# Patient Record
Sex: Male | Born: 1960 | Race: Black or African American | Hispanic: No | Marital: Single | Smoking: Former smoker
Health system: Southern US, Community
[De-identification: ages and names within clinical notes are randomized; demographics above are authoritative.]

## PROBLEM LIST (undated history)

## (undated) DIAGNOSIS — E119 Type 2 diabetes mellitus without complications: Secondary | ICD-10-CM

## (undated) DIAGNOSIS — J189 Pneumonia, unspecified organism: Secondary | ICD-10-CM

## (undated) DIAGNOSIS — I10 Essential (primary) hypertension: Secondary | ICD-10-CM

## (undated) DIAGNOSIS — K219 Gastro-esophageal reflux disease without esophagitis: Secondary | ICD-10-CM

## (undated) DIAGNOSIS — I639 Cerebral infarction, unspecified: Secondary | ICD-10-CM

## (undated) HISTORY — DX: Type 2 diabetes mellitus without complications: E11.9

## (undated) HISTORY — PX: OTHER SURGICAL HISTORY: SHX169

---

## 2004-11-21 ENCOUNTER — Emergency Department (HOSPITAL_COMMUNITY): Admission: EM | Admit: 2004-11-21 | Discharge: 2004-11-21 | Payer: Self-pay | Admitting: Emergency Medicine

## 2005-08-04 ENCOUNTER — Emergency Department (HOSPITAL_COMMUNITY): Admission: EM | Admit: 2005-08-04 | Discharge: 2005-08-04 | Payer: Self-pay | Admitting: Emergency Medicine

## 2005-08-05 ENCOUNTER — Ambulatory Visit: Payer: Self-pay | Admitting: Internal Medicine

## 2005-08-05 ENCOUNTER — Ambulatory Visit (HOSPITAL_COMMUNITY): Admission: RE | Admit: 2005-08-05 | Discharge: 2005-08-05 | Payer: Self-pay | Admitting: Internal Medicine

## 2006-01-02 ENCOUNTER — Emergency Department (HOSPITAL_COMMUNITY): Admission: EM | Admit: 2006-01-02 | Discharge: 2006-01-03 | Payer: Self-pay | Admitting: Emergency Medicine

## 2006-02-23 ENCOUNTER — Emergency Department (HOSPITAL_COMMUNITY): Admission: EM | Admit: 2006-02-23 | Discharge: 2006-02-23 | Payer: Self-pay | Admitting: Emergency Medicine

## 2006-08-16 ENCOUNTER — Emergency Department (HOSPITAL_COMMUNITY): Admission: EM | Admit: 2006-08-16 | Discharge: 2006-08-16 | Payer: Self-pay | Admitting: Emergency Medicine

## 2007-01-22 ENCOUNTER — Emergency Department (HOSPITAL_COMMUNITY): Admission: EM | Admit: 2007-01-22 | Discharge: 2007-01-22 | Payer: Self-pay | Admitting: Emergency Medicine

## 2007-03-26 ENCOUNTER — Ambulatory Visit (HOSPITAL_COMMUNITY): Admission: RE | Admit: 2007-03-26 | Discharge: 2007-03-26 | Payer: Self-pay | Admitting: Family Medicine

## 2007-06-23 ENCOUNTER — Emergency Department (HOSPITAL_COMMUNITY): Admission: EM | Admit: 2007-06-23 | Discharge: 2007-06-23 | Payer: Self-pay | Admitting: Emergency Medicine

## 2007-07-02 ENCOUNTER — Ambulatory Visit (HOSPITAL_COMMUNITY): Admission: RE | Admit: 2007-07-02 | Discharge: 2007-07-02 | Payer: Self-pay | Admitting: Family Medicine

## 2007-08-22 ENCOUNTER — Emergency Department (HOSPITAL_COMMUNITY): Admission: EM | Admit: 2007-08-22 | Discharge: 2007-08-22 | Payer: Self-pay | Admitting: Emergency Medicine

## 2008-09-02 ENCOUNTER — Emergency Department (HOSPITAL_COMMUNITY): Admission: EM | Admit: 2008-09-02 | Discharge: 2008-09-02 | Payer: Self-pay | Admitting: Emergency Medicine

## 2009-01-09 ENCOUNTER — Emergency Department (HOSPITAL_COMMUNITY): Admission: EM | Admit: 2009-01-09 | Discharge: 2009-01-09 | Payer: Self-pay | Admitting: Emergency Medicine

## 2009-01-26 ENCOUNTER — Ambulatory Visit (HOSPITAL_COMMUNITY): Admission: RE | Admit: 2009-01-26 | Discharge: 2009-01-26 | Payer: Self-pay | Admitting: Family Medicine

## 2009-05-19 ENCOUNTER — Emergency Department (HOSPITAL_COMMUNITY): Admission: EM | Admit: 2009-05-19 | Discharge: 2009-05-19 | Payer: Self-pay | Admitting: Emergency Medicine

## 2009-08-09 IMAGING — CT CT HEAD W/O CM
1 series · 16 of 30 positions shown, 20 images · non-contrast
Comparison: No priors

CLINICAL DATA: Left-sided headaches

CT HEAD WITHOUT CONTRAST
TECHNIQUE: Contiguous axial images were obtained from the base of
the skull through the vertex without contrast.

[Series 2: headseq 4.8 h37s · axial · 0.48mm/px · z∈[+132,+295]mm · 16 of 36 slices shown, 20 images]
[im 2/36  brain]
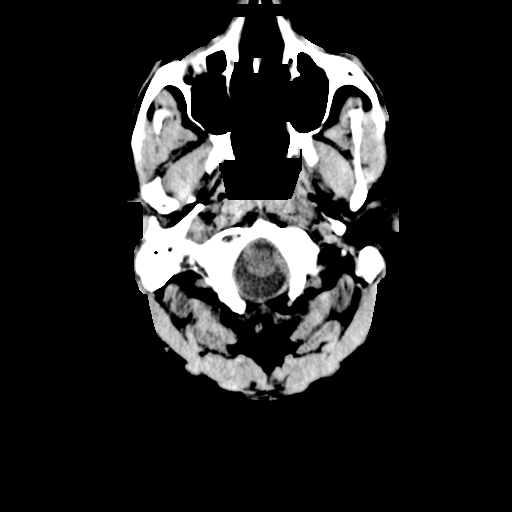
[im 2/36  bone]
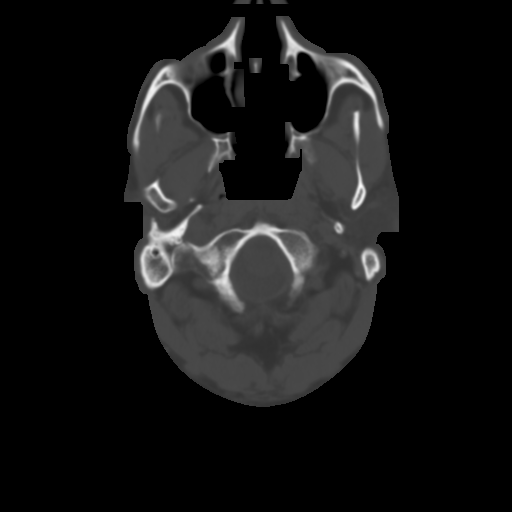
[im 4/36  brain]
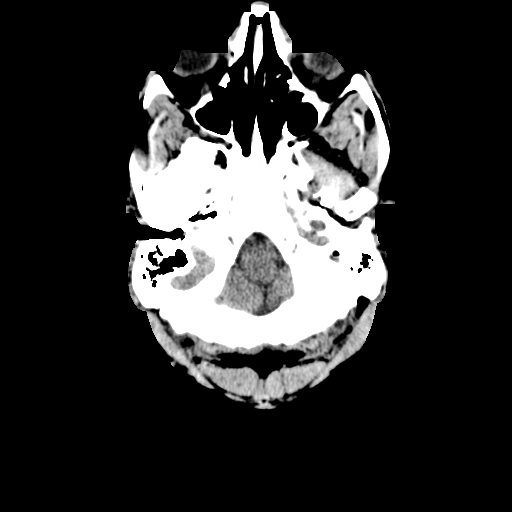
[im 7/36  brain]
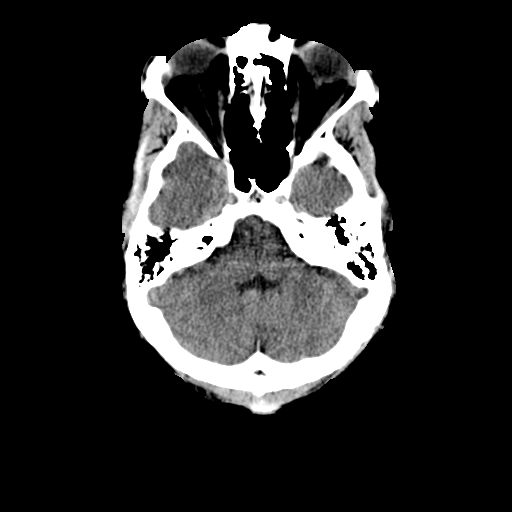
[im 9/36  brain]
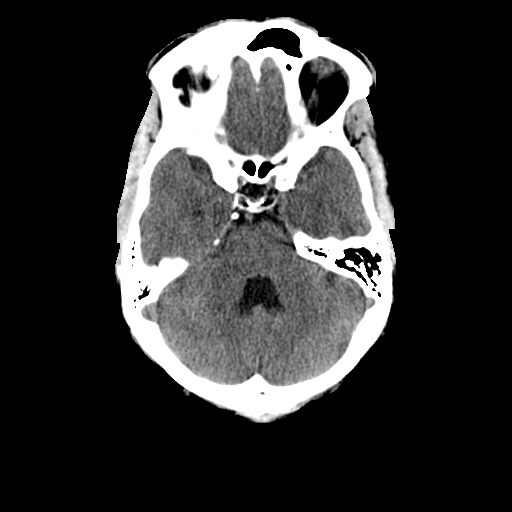
[im 10/36  brain]
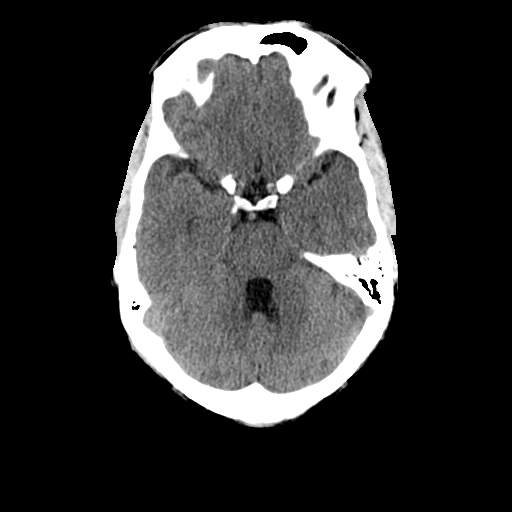
[im 10/36  bone]
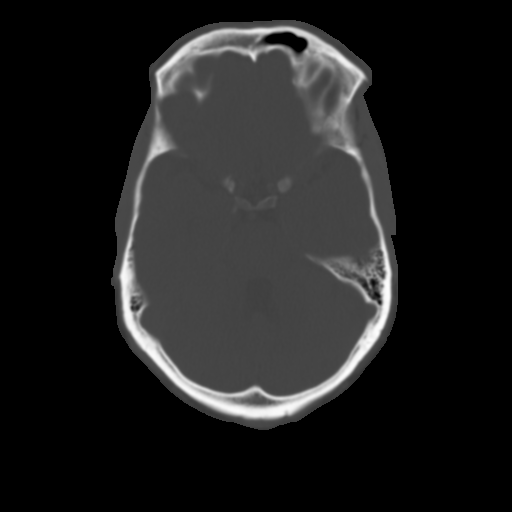
[im 13/36  brain]
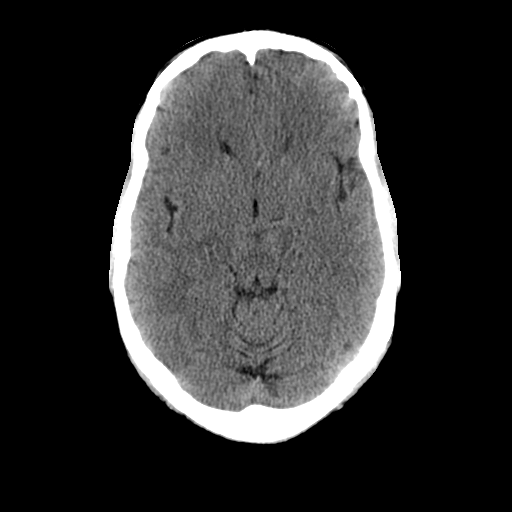
[im 15/36  brain]
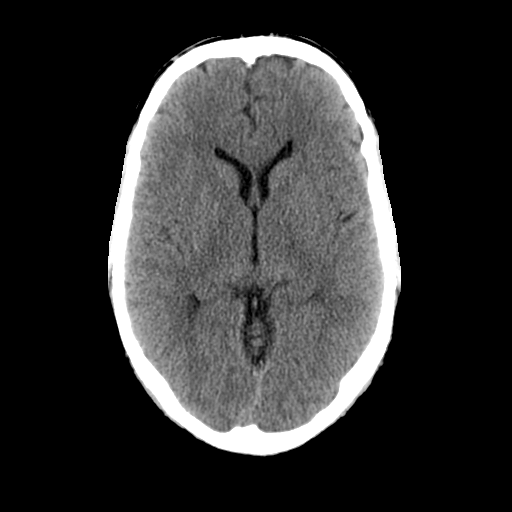
[im 17/36  brain]
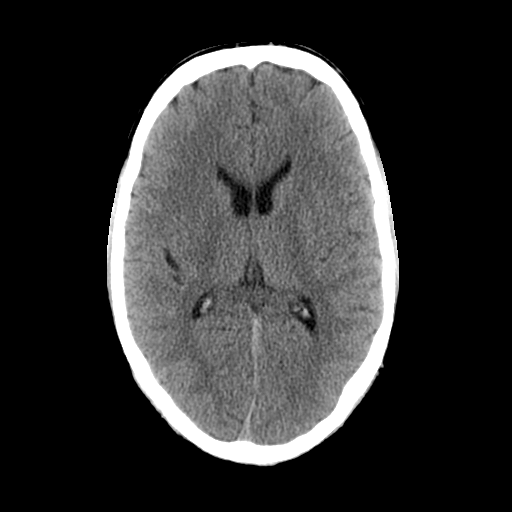
[im 19/36  brain]
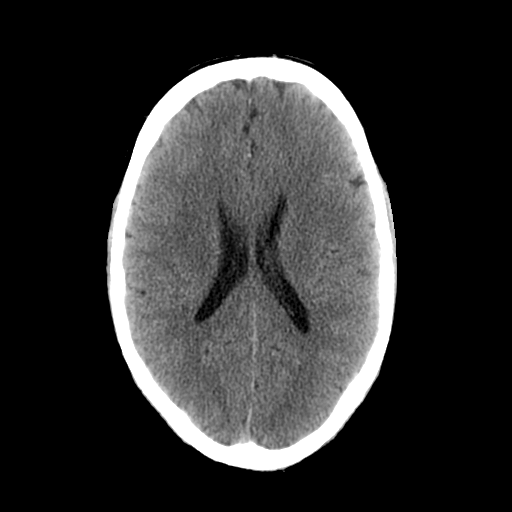
[im 19/36  bone]
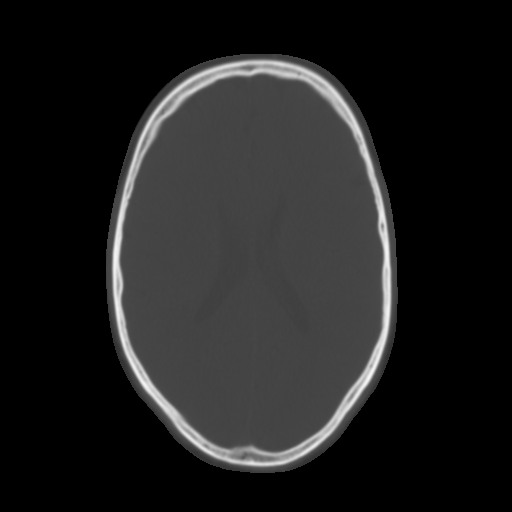
[im 21/36  brain]
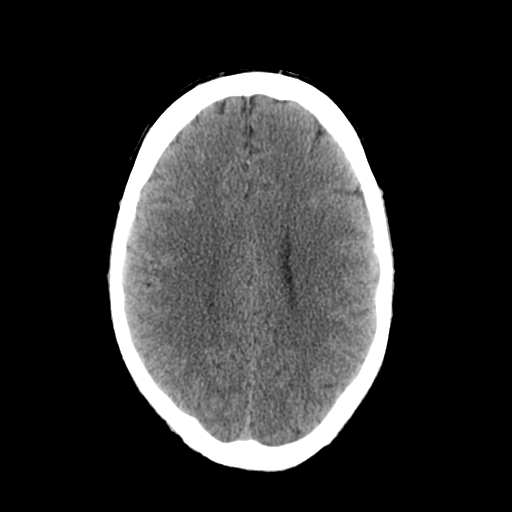
[im 23/36  brain]
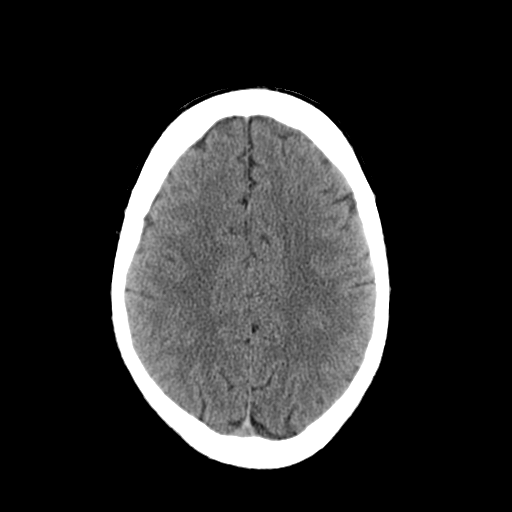
[im 26/36  brain]
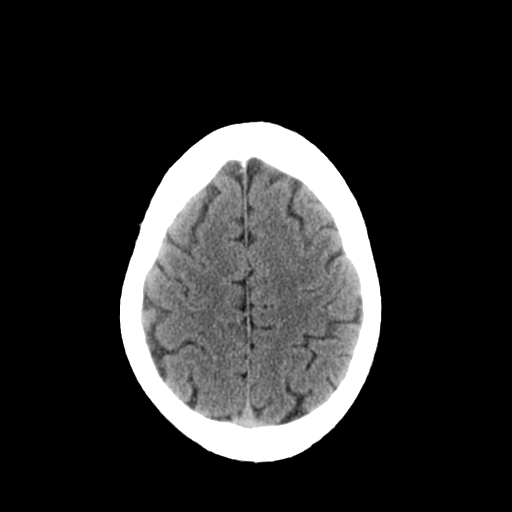
[im 27/36  brain]
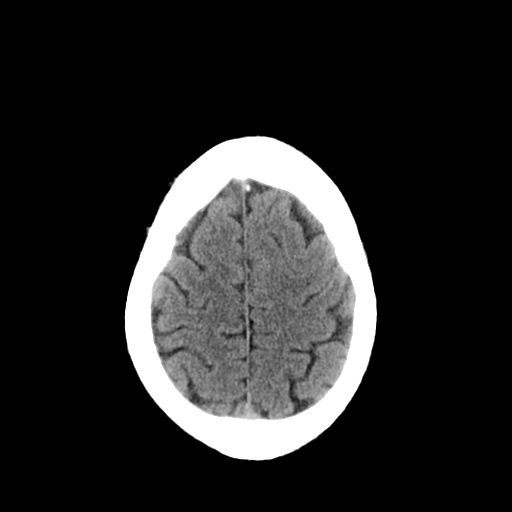
[im 27/36  bone]
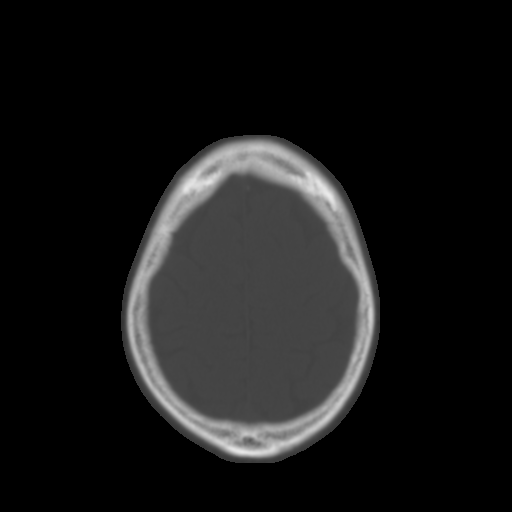
[im 29/36  brain]
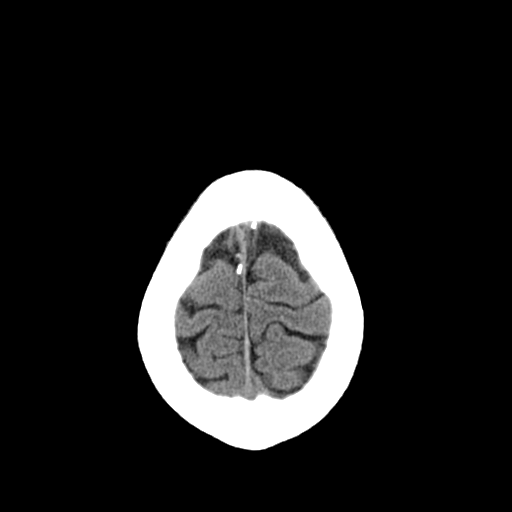
[im 32/36  brain]
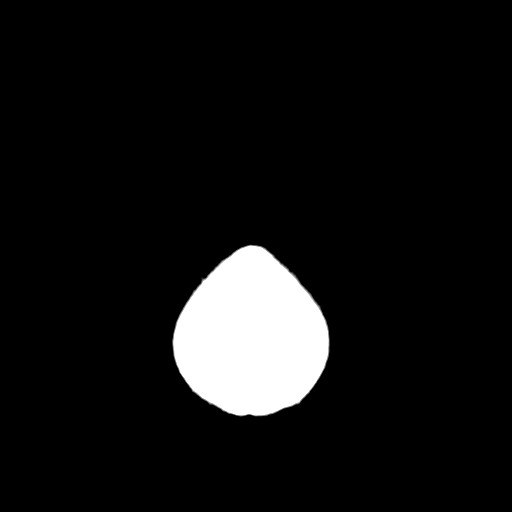
[im 34/36  brain]
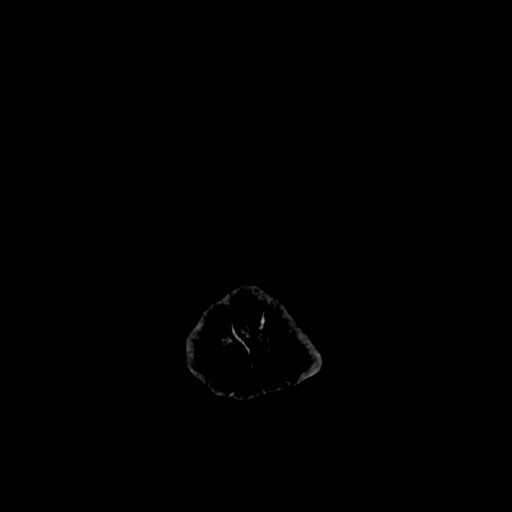

[16 of 30 positions shown; findings below may reference images not displayed]

FINDINGS: Ventricular size and CSF spaces within normal limits. No
evidence for acute infarct or bleed.  No mass effect. Calvarium
intact.  No fluid in the sinuses visualized.
IMPRESSION: No acute or significant findings.

## 2011-04-05 LAB — BASIC METABOLIC PANEL
Calcium: 9.3 mg/dL (ref 8.4–10.5)
GFR calc Af Amer: >60 mL/min (ref >60)
GFR calc non Af Amer: >60 mL/min (ref >60)
Sodium: 140 mEq/L (ref 135–145)

## 2011-04-11 LAB — CBC
HCT: 36.4 % — ABNORMAL LOW (ref 39.0–52.0)
Hemoglobin: 12.2 g/dL — ABNORMAL LOW (ref 13.0–17.0)
Platelets: 259 10*3/uL (ref 150–400)
RDW: 13.3 % (ref 11.5–15.5)

## 2011-04-11 LAB — DIFFERENTIAL
Basophils Absolute: 0.1 10*3/uL (ref 0.0–0.1)
Lymphocytes Relative: 36 % (ref 12–46)
Lymphs Abs: 2.5 10*3/uL (ref 0.7–4.0)
Neutro Abs: 3.9 10*3/uL (ref 1.7–7.7)
Neutrophils Relative %: 56 % (ref 43–77)
Promyelocytes Absolute: 0 %
nRBC: 0 /100 WBC

## 2011-04-11 LAB — URINALYSIS, ROUTINE W REFLEX MICROSCOPIC
Nitrite: NEGATIVE
Specific Gravity, Urine: <1.005 — ABNORMAL LOW (ref 1.005–1.030)

## 2011-04-11 LAB — BASIC METABOLIC PANEL
CO2: 25 mEq/L (ref 19–32)
Glucose, Bld: 110 mg/dL — ABNORMAL HIGH (ref 70–99)
Potassium: 3.7 mEq/L (ref 3.5–5.1)
Sodium: 139 mEq/L (ref 135–145)

## 2011-05-13 NOTE — Op Note (Signed)
Robert Lyons, Robert Lyons             ACCOUNT NO.:  [id number]   MEDICAL RECORD NO.:  [id number]          PATIENT TYPE:  AMB   LOCATION:  DAY                           FACILITY:  [facility]   PHYSICIAN:  Lionel December, M.D.    DATE OF BIRTH:  13-Oct-1961   DATE OF PROCEDURE:  08/05/2005  DATE OF DISCHARGE:                                 OPERATIVE REPORT   PROCEDURE:  Esophagogastroduodenoscopy with esophageal dilation.   INDICATIONS:  Robert Lyons is a 50 year old African-American male who was seen in  the emergency room last night by Dr. Greta Doom. He presented with sore  throat. His exam was normal. He was also complaining of dysphagia to solids  and needs to clear his throat. She felt that he was having esophageal  problems and therefore referred him for further evaluation. He also gets  frequent heartburn. He has taken some Advil lately for dental pain.   Procedure risks were reviewed with the patient, and informed consent was  obtained.   PREOPERATIVE MEDICATIONS:  Cetacaine spray for pharyngeal topical  anesthesia, Demerol 25 mg IV, Versed 6 mg IV.   FINDINGS:  Procedure performed in endoscopy suite. Patient's vital signs and  O2 saturation were monitored during the procedure and remained stable. The  patient was placed in left lateral position. Olympus videoscope was passed  via oropharynx without any difficulty into the esophagus.   On the way out, he was noted to have prominent tonsils, but there was no  exudate. Laryngeal ______________ appeared to be normal.   Esophagus. Mucosa of the esophagus was normal. GE junction was wavy, located  at 38 cm, but there were no erosions, ulcers, or rings noted. Hiatus was at  40 cm.   Stomach. It was empty and distended very well with insufflation. Folds of  proximal stomach were normal. Examination of the mucosa at body, antrum,  pyloric channel as well as angularis, fundus, and cardia was normal.   Duodenum. Bulbar mucosa was normal.  Scope was passed to the second part of  the duodenum where mucosa and folds were normal. Endoscope was withdrawn.   Esophagus was dilated by passing 56-French Maloney dilator to full  insertion. As the dilator was withdrawn, endoscope was passed again, and  there was no disruption noted to esophageal mucosa. Endoscope was withdrawn.  The patient tolerated the procedure well.   FINAL DIAGNOSES:  Small sliding hiatal hernia. Otherwise normal EGD.  Esophagus dilated by passing 56-French Maloney dilator given history of  dysphagia but no mucosa disruption noted.   RECOMMENDATIONS:  1.  Anti-reflux measures.  2.  Prilosec OTC 20 mg p.o. q.a.m.  3.  The patient will call with progress report next week.       NR/MEDQ  D:  08/05/2005  T:  08/05/2005  Job:  [id number]   cc:   Mila Homer. Sudie Bailey, M.D.  [address] [place], [place] [address]  Fax: [phone]

## 2011-09-16 ENCOUNTER — Emergency Department (HOSPITAL_COMMUNITY): Payer: Self-pay

## 2011-09-16 ENCOUNTER — Other Ambulatory Visit: Payer: Self-pay

## 2011-09-16 ENCOUNTER — Emergency Department (HOSPITAL_COMMUNITY)
Admission: EM | Admit: 2011-09-16 | Discharge: 2011-09-16 | Disposition: A | Discharge status: Home / Self Care | Payer: Self-pay | Attending: Emergency Medicine | Admitting: Emergency Medicine

## 2011-09-16 DIAGNOSIS — I10 Essential (primary) hypertension: Secondary | ICD-10-CM | POA: Insufficient documentation

## 2011-09-16 DIAGNOSIS — K219 Gastro-esophageal reflux disease without esophagitis: Secondary | ICD-10-CM | POA: Insufficient documentation

## 2011-09-16 DIAGNOSIS — E119 Type 2 diabetes mellitus without complications: Secondary | ICD-10-CM | POA: Insufficient documentation

## 2011-09-16 HISTORY — DX: Essential (primary) hypertension: I10

## 2011-09-16 HISTORY — DX: Gastro-esophageal reflux disease without esophagitis: K21.9

## 2011-09-16 LAB — URINALYSIS, ROUTINE W REFLEX MICROSCOPIC
Bilirubin Urine: NEGATIVE
Glucose, UA: >1000 mg/dL — AB
Ketones, ur: NEGATIVE mg/dL
Protein, ur: NEGATIVE mg/dL
pH: 6.5 (ref 5.0–8.0)

## 2011-09-16 LAB — BASIC METABOLIC PANEL
CO2: 26 mEq/L (ref 19–32)
Chloride: 100 mEq/L (ref 96–112)
Creatinine, Ser: 0.91 mg/dL (ref 0.50–1.35)
GFR calc Af Amer: >60 mL/min (ref >60)
Potassium: 3.4 mEq/L — ABNORMAL LOW (ref 3.5–5.1)
Sodium: 134 mEq/L — ABNORMAL LOW (ref 135–145)

## 2011-09-16 LAB — CBC
MCHC: 34.4 g/dL (ref 30.0–36.0)
MCV: 87.4 fL (ref 78.0–100.0)
Platelets: 221 10*3/uL (ref 150–400)
RDW: 11.9 % (ref 11.5–15.5)

## 2011-09-16 LAB — DIFFERENTIAL
Basophils Absolute: 0 10*3/uL (ref 0.0–0.1)
Basophils Relative: 1 % (ref 0–1)
Monocytes Relative: 13 % — ABNORMAL HIGH (ref 3–12)

## 2011-09-16 MED ORDER — KETOROLAC TROMETHAMINE 60 MG/2ML IM SOLN
60.0000 mg | Freq: Once | INTRAMUSCULAR | Status: AC
  Administered 2011-09-16: 60 mg via INTRAMUSCULAR
  Filled 2011-09-16: qty 2

## 2011-09-16 MED ORDER — IOHEXOL 300 MG/ML  SOLN
100.0000 mL | Freq: Once | INTRAMUSCULAR | Status: AC | PRN
  Administered 2011-09-16: 100 mL via INTRAVENOUS

## 2011-09-16 NOTE — ED Notes (Signed)
Pt complain of chest and belly pain for about 17 months. Also states he has to clear his throat a lot and when he does it makes his stomach hurt and he feels nauseated. States last bm was this morning. Denies n/v/d

## 2011-09-16 NOTE — ED Notes (Signed)
Pt c/o intermittent chest pain x 17 months. Pt also c/o feeling like something is wrong with his throat and when he swallows his stomach hurts. Pt c/o nausea, dizziness and shortness of breath.

## 2011-09-16 NOTE — ED Notes (Signed)
Pt eval by EDP 

## 2011-09-16 NOTE — ED Provider Notes (Signed)
History   Chart scribed for [name], MD by Enos Fling; the patient was seen in room [facility]; this patient's care was started at 8:07 AM.    CSN: [id number] Arrival date & time: 09/16/2011  7:39 AM  Chief Complaint  Patient presents with  . Chest Pain    HPI  The history is provided by the patient.  Robert Lyons is a 50 y.o. male who presents to the Emergency Department complaining of abd pain. Pt c/o diffuse abd pain described as cramping with associated nausea and difficulty swallowing, also intermittent x 1.5 years. Pt tolerates food and fluids po, but states he feels like food "gets hung up" in his throat. No vomiting or weight loss. Pt has seen Dr. Sudie Bailey 1x for this and was told his throat was normal. No prior abd surgeries. Pt also c/o diffuse chest pain, described as "someone tap dancing with cleats", that has been intermittent x 1.5 years. Pain not worse with breathing but soreness to left lateral chest is worse with palpation. Pt reports sob lately has been gradually worsening. No recent sick contacts. Denies fever, chills, headache, cough, or congestion.   No PCP ([facility])  Past Medical History  Diagnosis Date  . Hypertension   . GERD (gastroesophageal reflux disease)     History reviewed. No pertinent past surgical history.  Family History  Problem Relation Age of Onset  . Diabetes Mother   . Hypertension Mother     History  Substance Use Topics  . Smoking status: Former Games developer  . Smokeless tobacco: Not on file  . Alcohol Use: No      Review of Systems  Review of Systems 10 Systems reviewed and are negative for acute change except as noted in the HPI.  Allergies  Review of patient's allergies indicates no known allergies.  Home Medications   Current Outpatient Rx  Name Route Sig Dispense Refill  . LISINOPRIL 10 MG PO TABS Oral Take 10 mg by mouth daily.      [name] RANITIDINE HCL 300 MG PO TABS Oral Take 300 mg by mouth daily.         Physical Exam    BP 158/79  Pulse 86  Temp(Src) 98.1 F (36.7 C) (Oral)  Resp 14  Ht 5\' 11"  (1.803 m)  Wt 210 lb (95.255 kg)  BMI 29.29 kg/m2  SpO2 98%  Physical Exam  Nursing note and vitals reviewed. Constitutional: He is oriented to person, place, and time. No distress.       Appearance consistent with age of record  HENT:  Head: Normocephalic and atraumatic.  Right Ear: External ear normal.  Left Ear: External ear normal.  Nose: Nose normal.  Mouth/Throat: Oropharynx is clear and moist and mucous membranes are normal. No uvula swelling. No oropharyngeal exudate or tonsillar abscesses.  Eyes: Conjunctivae are normal.  Neck: Neck supple.  Cardiovascular: Normal rate and regular rhythm.  Exam reveals no gallop and no friction rub.   No murmur heard. Pulmonary/Chest: Effort normal and breath sounds normal. He has no wheezes. He has no rhonchi. He has no rales. He exhibits tenderness (left lateral chest wall).  Abdominal: Soft. He exhibits no distension. There is tenderness (diffuse).  Musculoskeletal: Normal range of motion. He exhibits no edema and no tenderness.       Normal appearance of extremities  Neurological: He is alert and oriented to person, place, and time. No sensory deficit.  Skin: No rash noted.       Color  normal  Psychiatric: He has a normal mood and affect.    ED Course  Procedures - none  OTHER DATA REVIEWED: Nursing notes and vital signs reviewed. Prior records reviewed.   DIAGNOSTIC STUDIES: Oxygen Saturation is 96% on room air, normal by my Interpretation.  EKG:   Date: 09/16/2011  Rate: 80  Rhythm: normal sinus rhythm  QRS Axis: normal  Intervals: normal  ST/T Wave abnormalities: normal  Conduction Disutrbances:none  Narrative Interpretation:   Old EKG Reviewed: unchanged (05/19/09)    LABS / RADIOLOGY: Results for orders placed during the hospital encounter of 09/16/11  CBC      Component Value Range   WBC 5.0  4.0 - 10.5  (K/uL)   RBC 4.19 (*) 4.22 - 5.81 (MIL/uL)   Hemoglobin 12.6 (*) 13.0 - 17.0 (g/dL)   HCT 16.1 (*) 09.6 - 52.0 (%)   MCV 87.4  78.0 - 100.0 (fL)   MCH 30.1  26.0 - 34.0 (pg)   MCHC 34.4  30.0 - 36.0 (g/dL)   RDW 04.5  40.9 - 81.1 (%)   Platelets 221  150 - 400 (K/uL)  DIFFERENTIAL      Component Value Range   Neutrophils Relative 50  43 - 77 (%)   Neutro Abs 2.5  1.7 - 7.7 (K/uL)   Lymphocytes Relative 34  12 - 46 (%)   Lymphs Abs 1.7  0.7 - 4.0 (K/uL)   Monocytes Relative 13 (*) 3 - 12 (%)   Monocytes Absolute 0.6  0.1 - 1.0 (K/uL)   Eosinophils Relative 3  0 - 5 (%)   Eosinophils Absolute 0.2  0.0 - 0.7 (K/uL)   Basophils Relative 1  0 - 1 (%)   Basophils Absolute 0.0  0.0 - 0.1 (K/uL)  BASIC METABOLIC PANEL      Component Value Range   Sodium 134 (*) 135 - 145 (mEq/L)   Potassium 3.4 (*) 3.5 - 5.1 (mEq/L)   Chloride 100  96 - 112 (mEq/L)   CO2 26  19 - 32 (mEq/L)   Glucose, Bld 263 (*) 70 - 99 (mg/dL)   BUN 10  6 - 23 (mg/dL)   Creatinine, Ser 9.14  0.50 - 1.35 (mg/dL)   Calcium 9.3  8.4 - 78.2 (mg/dL)   GFR calc non Af Amer >60  >60 (mL/min)   GFR calc Af Amer >60  >60 (mL/min)  URINALYSIS, ROUTINE W REFLEX MICROSCOPIC      Component Value Range   Color, Urine YELLOW  YELLOW    Appearance CLEAR  CLEAR    Specific Gravity, Urine 1.010  1.005 - 1.030    pH 6.5  5.0 - 8.0    Glucose, UA >1000 (*) NEGATIVE (mg/dL)   Hgb urine dipstick TRACE (*) NEGATIVE    Bilirubin Urine NEGATIVE  NEGATIVE    Ketones, ur NEGATIVE  NEGATIVE (mg/dL)   Protein, ur NEGATIVE  NEGATIVE (mg/dL)   Urobilinogen, UA 0.2  0.0 - 1.0 (mg/dL)   Nitrite NEGATIVE  NEGATIVE    Leukocytes, UA NEGATIVE  NEGATIVE   URINE MICROSCOPIC-ADD ON      Component Value Range   RBC / HPF 0-2  <3 (RBC/hpf)    Dg Chest 2 View  09/16/2011  *RADIOLOGY REPORT*  Clinical Data: Chest pain, shortness of breath  CHEST - 2 VIEW  Comparison: 07/02/2007  Findings: Upper-normal size of cardiac silhouette. Mediastinal  contours and pulmonary vascularity normal. Minimal chronic peribronchial thickening. No pulmonary infiltrate, pleural effusion or pneumothorax.  No acute osseous findings. Scattered end plate spur formation thoracic spine.  IMPRESSION: Minimal chronic bronchitic changes. No acute abnormalities.  Original Report Authenticated By: Lollie Marrow, M.D.   Ct Abdomen Pelvis W Contrast  09/16/2011  *RADIOLOGY REPORT*  Clinical Data: Diffuse abdominal pain.  Left-sided flank pain.  CT ABDOMEN AND PELVIS WITH CONTRAST  Technique:  Multidetector CT imaging of the abdomen and pelvis was performed following the standard protocol during bolus administration of intravenous contrast.  Contrast: OMNIPAQUE IOHEXOL 300 MG/ML IV SOLN  Comparison: 01/09/2009.  Findings: Lung bases clear.  Fatty liver.  8 mm cystic lesion is present in the superior right hepatic lobe, unchanged from prior. No calcified gallstones.  Tiny sub-centimeter left upper pole renal cysts and anterior interpolar left renal cysts appear similar to the prior exam.  Right kidney appears normal.  Adrenal glands normal.  Spleen normal.  Pancreas and common bile duct appear within normal limits.  The stomach and duodenum appear within normal limits.  Proximal small bowel is normal.  Normal appendix in the right lower quadrant.  The colon appears within normal limits.  No free fluid. No free air.  Urinary bladder unremarkable.  No adenopathy. Vasculature is within normal limits. Bilateral SI joint ankylosis is present.  There are no erosive changes of the SI joints. Statistically, this is likely degenerative but can be associated with seronegative spondyloarthropathies.  IMPRESSION: 1.  No acute abnormality. 2.  Hepatic steatosis. 3.  Unchanged right hepatic lobe and left renal cysts. 4.  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  Original Report Authenticated By: Andreas Newport, M.D.      ED COURSE: 8:30 AM - Initial H&P performed. Pt with multiple complaints chest pain, abd pain, and sob, all present >1 year. Will order EKG, CBC, Bmet, UA, chest x-ray, and CT abd/pelvis.  1:45 PM - Pt resting comfortably, all results discussed, informed pt of need to see [facility] for DM follow up. Pt states he called [facility] prior to coming to ED and that it would be 3 months before he can get an appointment.  MDM: Patient is stable and in no acute distress. Glucose is elevated. We'll start metformin. Follow up with primary care. Remainder of evaluation shows no acute findings.   IMPRESSION: 1. Diabetes mellitus      PLAN: Discharge with [facility] follow up for DM management All results reviewed and discussed with pt, questions answered, pt agreeable with plan.   MEDS GIVEN IN ED: iohexol (OMNIPAQUE) 300 MG/ML injection 100 mL (100 mL Intravenous Contrast Given 09/16/11 1034)  ketorolac (TORADOL) injection 60 mg (60 mg Intramuscular Given 09/16/11 1205)     DISCHARGE MEDICATIONS: Metformin  SCRIBE ATTESTATION: I personally performed the services described in this documentation, which was scribed in my presence. The recorded information has been reviewed and considered. No att. providers found          [name], MD 09/16/11 1541

## 2011-12-23 ENCOUNTER — Emergency Department (HOSPITAL_COMMUNITY)
Admission: EM | Admit: 2011-12-23 | Discharge: 2011-12-23 | Disposition: A | Discharge status: Home / Self Care | Payer: Self-pay | Attending: Emergency Medicine | Admitting: Emergency Medicine

## 2011-12-23 ENCOUNTER — Encounter (HOSPITAL_COMMUNITY): Payer: Self-pay | Admitting: *Deleted

## 2011-12-23 ENCOUNTER — Other Ambulatory Visit: Payer: Self-pay

## 2011-12-23 ENCOUNTER — Emergency Department (HOSPITAL_COMMUNITY): Payer: Self-pay

## 2011-12-23 DIAGNOSIS — E119 Type 2 diabetes mellitus without complications: Secondary | ICD-10-CM | POA: Insufficient documentation

## 2011-12-23 DIAGNOSIS — R071 Chest pain on breathing: Secondary | ICD-10-CM | POA: Insufficient documentation

## 2011-12-23 DIAGNOSIS — K219 Gastro-esophageal reflux disease without esophagitis: Secondary | ICD-10-CM | POA: Insufficient documentation

## 2011-12-23 DIAGNOSIS — R0789 Other chest pain: Secondary | ICD-10-CM

## 2011-12-23 DIAGNOSIS — R109 Unspecified abdominal pain: Secondary | ICD-10-CM | POA: Insufficient documentation

## 2011-12-23 HISTORY — DX: Pneumonia, unspecified organism: J18.9

## 2011-12-23 LAB — CBC
Hemoglobin: 13.2 g/dL (ref 13.0–17.0)
MCH: 29.8 pg (ref 26.0–34.0)
MCHC: 33.8 g/dL (ref 30.0–36.0)
MCV: 88 fL (ref 78.0–100.0)
RBC: 4.43 MIL/uL (ref 4.22–5.81)

## 2011-12-23 LAB — BASIC METABOLIC PANEL
CO2: 24 mEq/L (ref 19–32)
Calcium: 10.9 mg/dL — ABNORMAL HIGH (ref 8.4–10.5)
Creatinine, Ser: 1.09 mg/dL (ref 0.50–1.35)
GFR calc non Af Amer: 76 mL/min — ABNORMAL LOW (ref >90)
Glucose, Bld: 251 mg/dL — ABNORMAL HIGH (ref 70–99)

## 2011-12-23 MED ORDER — SODIUM CHLORIDE 0.9 % IV BOLUS (SEPSIS)
500.0000 mL | Freq: Once | INTRAVENOUS | Status: AC
  Administered 2011-12-23: 500 mL via INTRAVENOUS

## 2011-12-23 MED ORDER — IBUPROFEN 800 MG PO TABS: 800.0000 mg | ORAL_TABLET | Freq: Three times a day (TID) | ORAL | Status: AC

## 2011-12-23 MED ORDER — HYDROMORPHONE HCL PF 1 MG/ML IJ SOLN
1.0000 mg | Freq: Once | INTRAMUSCULAR | Status: AC
  Administered 2011-12-23: 1 mg via INTRAMUSCULAR

## 2011-12-23 MED ORDER — KETOROLAC TROMETHAMINE 30 MG/ML IJ SOLN
30.0000 mg | Freq: Once | INTRAMUSCULAR | Status: AC
  Administered 2011-12-23: 30 mg via INTRAVENOUS
  Filled 2011-12-23: qty 1

## 2011-12-23 MED ORDER — ONDANSETRON HCL 4 MG/2ML IJ SOLN
4.0000 mg | Freq: Once | INTRAMUSCULAR | Status: AC
  Administered 2011-12-23: 4 mg via INTRAVENOUS
  Filled 2011-12-23: qty 2

## 2011-12-23 MED ORDER — HYDROCODONE-ACETAMINOPHEN 5-325 MG PO TABS: 1.0000 | ORAL_TABLET | ORAL | Status: AC | PRN

## 2011-12-23 MED ORDER — ASPIRIN 81 MG PO CHEW
324.0000 mg | CHEWABLE_TABLET | Freq: Once | ORAL | Status: AC
  Administered 2011-12-23: 324 mg via ORAL
  Filled 2011-12-23: qty 4

## 2011-12-23 MED ORDER — HYDROMORPHONE HCL PF 1 MG/ML IJ SOLN
INTRAMUSCULAR | Status: AC
  Administered 2011-12-23: 1 mg via INTRAMUSCULAR
  Filled 2011-12-23: qty 1

## 2011-12-23 NOTE — ED Provider Notes (Signed)
History     CSN: [id number]  Arrival date & time 12/23/11  2004   First MD Initiated Contact with Patient 12/23/11 2054      Chief Complaint  Patient presents with  . Chest Pain  . Abdominal Pain    (Consider location/radiation/quality/duration/timing/severity/associated sxs/prior treatment) HPI Comments: Robert Lyons is a 50 y.o. Male with a h/o hypertension, diabetes, GERD  who presents to the Emergency Department complaining of sharp stabbing left sided chest pain that began this afternoon. Pain is worse with deep breathing and was accompanied by cough, nausea and shortness of breath. Pain is similar to pain he experienced when he had pneumonia last. He denies fever, chills,  vomiting, myalgias.  PCP Dr. Sudie Bailey   Past Medical History  Diagnosis Date  . Hypertension   . GERD (gastroesophageal reflux disease)   . Pneumonia   . Diabetes mellitus     History reviewed. No pertinent past surgical history.  Family History  Problem Relation Age of Onset  . Diabetes Mother   . Hypertension Mother     History  Substance Use Topics  . Smoking status: Former Games developer  . Smokeless tobacco: Not on file  . Alcohol Use: No      Review of Systems 10 Systems reviewed and are negative for acute change except as noted in the HPI. Allergies  Review of patient's allergies indicates no known allergies.  Home Medications   Current Outpatient Rx  Name Route Sig Dispense Refill  . LISINOPRIL 10 MG PO TABS Oral Take 10 mg by mouth daily.      [name] RANITIDINE HCL 300 MG PO TABS Oral Take 300 mg by mouth daily.        BP 124/75  Pulse 102  Temp(Src) 98.1 F (36.7 C) (Oral)  Resp 19  Ht 5\' 11"  (1.803 m)  Wt 200 lb (90.719 kg)  BMI 27.89 kg/m2  SpO2 96%  Physical Exam  Nursing note and vitals reviewed. Constitutional: He is oriented to person, place, and time. He appears well-developed and well-nourished. He appears distressed.       Grimacing with sharp pains that ar  intermittent to left side of chest.  Eyes: Conjunctivae and EOM are normal. Pupils are equal, round, and reactive to light.  Neck: Normal range of motion. Neck supple.  Cardiovascular: Normal rate, normal heart sounds and intact distal pulses.   Pulmonary/Chest: Effort normal and breath sounds normal. He exhibits no tenderness.       No tenderness to palpation  Abdominal: Soft. Bowel sounds are normal.  Musculoskeletal: Normal range of motion.  Neurological: He is alert and oriented to person, place, and time. He has normal reflexes.  Skin: Skin is warm.    ED Course  Procedures (including critical care time)  Results for orders placed during the hospital encounter of 12/23/11  CBC      Component Value Range   WBC 8.9  4.0 - 10.5 (K/uL)   RBC 4.43  4.22 - 5.81 (MIL/uL)   Hemoglobin 13.2  13.0 - 17.0 (g/dL)   HCT 21.3  08.6 - 57.8 (%)   MCV 88.0  78.0 - 100.0 (fL)   MCH 29.8  26.0 - 34.0 (pg)   MCHC 33.8  30.0 - 36.0 (g/dL)   RDW 46.9  62.9 - 52.8 (%)   Platelets 256  150 - 400 (K/uL)  BASIC METABOLIC PANEL      Component Value Range   Sodium 134 (*) 135 - 145 (mEq/L)  Potassium 3.9  3.5 - 5.1 (mEq/L)   Chloride 95 (*) 96 - 112 (mEq/L)   CO2 24  19 - 32 (mEq/L)   Glucose, Bld 251 (*) 70 - 99 (mg/dL)   BUN 14  6 - 23 (mg/dL)   Creatinine, Ser 1.61  0.50 - 1.35 (mg/dL)   Calcium 09.6 (*) 8.4 - 10.5 (mg/dL)   GFR calc non Af Amer 76 (*) >90 (mL/min)   GFR calc Af Amer 88 (*) >90 (mL/min)  TROPONIN I      Component Value Range   Troponin I <0.30  <0.30 (ng/mL)    Date: 12/23/2011   2022  Rate:101  Rhythm: sinus tachycardia  QRS Axis: normal  Intervals: normal  ST/T Wave abnormalities: normal  Conduction Disutrbances:none  Narrative Interpretation:   Old EKG Reviewed: none available      MDM  Patient with sharp chest pain to the left side that began today, worse with deep breathing. EKG, chest xray, labs and troponin are negative. Given analgesics with some  relief. Pt feels improved after observation and/or treatment in ED.Pt stable in ED with no significant deterioration in condition.The patient appears reasonably screened and/or stabilized for discharge and I doubt any other medical condition or other [facility] requiring further screening, evaluation, or treatment in the ED at this time prior to discharge.  MDM Reviewed: previous chart, nursing note and vitals Interpretation: labs and x-ray           Nicoletta Dress. Colon Branch, MD 12/23/11 2151

## 2011-12-23 NOTE — ED Notes (Signed)
Patient is alert and oriented x 4 with respirations even and unlabored.  NAD at this time.  Discharge instructions reviewed with patient and patient verbalized understanding.  Pt ambulated to lobby with steady gait, and wife to transport pt home.  

## 2011-12-23 NOTE — ED Notes (Signed)
Pt states that he began to experience left side pain that extends from the left nipple area down to left waistband area, pt c/o sob, cough, nausea. Pt states that the pain started today,

## 2012-03-15 ENCOUNTER — Emergency Department (HOSPITAL_COMMUNITY): Payer: Self-pay

## 2012-03-15 ENCOUNTER — Encounter (HOSPITAL_COMMUNITY): Payer: Self-pay | Admitting: *Deleted

## 2012-03-15 ENCOUNTER — Emergency Department (HOSPITAL_COMMUNITY)
Admission: EM | Admit: 2012-03-15 | Discharge: 2012-03-15 | Disposition: A | Discharge status: Home / Self Care | Payer: Self-pay | Attending: Emergency Medicine | Admitting: Emergency Medicine

## 2012-03-15 ENCOUNTER — Other Ambulatory Visit: Payer: Self-pay

## 2012-03-15 DIAGNOSIS — R062 Wheezing: Secondary | ICD-10-CM | POA: Insufficient documentation

## 2012-03-15 DIAGNOSIS — K219 Gastro-esophageal reflux disease without esophagitis: Secondary | ICD-10-CM | POA: Insufficient documentation

## 2012-03-15 DIAGNOSIS — R071 Chest pain on breathing: Secondary | ICD-10-CM | POA: Insufficient documentation

## 2012-03-15 DIAGNOSIS — R0602 Shortness of breath: Secondary | ICD-10-CM | POA: Insufficient documentation

## 2012-03-15 DIAGNOSIS — E119 Type 2 diabetes mellitus without complications: Secondary | ICD-10-CM | POA: Insufficient documentation

## 2012-03-15 DIAGNOSIS — R7989 Other specified abnormal findings of blood chemistry: Secondary | ICD-10-CM | POA: Insufficient documentation

## 2012-03-15 DIAGNOSIS — Z79899 Other long term (current) drug therapy: Secondary | ICD-10-CM | POA: Insufficient documentation

## 2012-03-15 DIAGNOSIS — R110 Nausea: Secondary | ICD-10-CM | POA: Insufficient documentation

## 2012-03-15 DIAGNOSIS — I10 Essential (primary) hypertension: Secondary | ICD-10-CM | POA: Insufficient documentation

## 2012-03-15 DIAGNOSIS — R0789 Other chest pain: Secondary | ICD-10-CM

## 2012-03-15 DIAGNOSIS — R1013 Epigastric pain: Secondary | ICD-10-CM | POA: Insufficient documentation

## 2012-03-15 LAB — COMPREHENSIVE METABOLIC PANEL
Albumin: 4.4 g/dL (ref 3.5–5.2)
BUN: 20 mg/dL (ref 6–23)
Calcium: 10.9 mg/dL — ABNORMAL HIGH (ref 8.4–10.5)
Chloride: 95 mEq/L — ABNORMAL LOW (ref 96–112)
Creatinine, Ser: 1.02 mg/dL (ref 0.50–1.35)
Total Bilirubin: 1 mg/dL (ref 0.3–1.2)
Total Protein: 8 g/dL (ref 6.0–8.3)

## 2012-03-15 LAB — CBC
HCT: 39.2 % (ref 39.0–52.0)
MCH: 30 pg (ref 26.0–34.0)
MCHC: 34.9 g/dL (ref 30.0–36.0)
MCV: 85.8 fL (ref 78.0–100.0)
RDW: 12 % (ref 11.5–15.5)

## 2012-03-15 LAB — CARDIAC PANEL(CRET KIN+CKTOT+MB+TROPI)
CK, MB: 1.6 ng/mL (ref 0.3–4.0)
Total CK: 121 U/L (ref 7–232)
Troponin I: <0.3 ng/mL (ref <0.30)

## 2012-03-15 LAB — URINALYSIS, ROUTINE W REFLEX MICROSCOPIC
Glucose, UA: NEGATIVE mg/dL
Hgb urine dipstick: NEGATIVE
Leukocytes, UA: NEGATIVE
Protein, ur: NEGATIVE mg/dL
pH: 5.5 (ref 5.0–8.0)

## 2012-03-15 LAB — LIPASE, BLOOD: Lipase: 25 U/L (ref 11–59)

## 2012-03-15 MED ORDER — PROMETHAZINE HCL 25 MG/ML IJ SOLN
12.5000 mg | Freq: Once | INTRAMUSCULAR | Status: AC
  Administered 2012-03-15: 12.5 mg via INTRAVENOUS
  Filled 2012-03-15: qty 1

## 2012-03-15 MED ORDER — ALBUTEROL SULFATE (5 MG/ML) 0.5% IN NEBU
2.5000 mg | INHALATION_SOLUTION | Freq: Once | RESPIRATORY_TRACT | Status: AC
  Administered 2012-03-15: 2.5 mg via RESPIRATORY_TRACT
  Filled 2012-03-15: qty 0.5

## 2012-03-15 MED ORDER — KETOROLAC TROMETHAMINE 30 MG/ML IJ SOLN
30.0000 mg | Freq: Once | INTRAMUSCULAR | Status: AC
  Administered 2012-03-15: 30 mg via INTRAVENOUS
  Filled 2012-03-15: qty 1

## 2012-03-15 MED ORDER — HYDROCODONE-ACETAMINOPHEN 5-325 MG PO TABS: 1.0000 | ORAL_TABLET | ORAL | Status: AC | PRN

## 2012-03-15 MED ORDER — ONDANSETRON HCL 4 MG/2ML IJ SOLN
4.0000 mg | Freq: Once | INTRAMUSCULAR | Status: AC
  Administered 2012-03-15: 4 mg via INTRAVENOUS
  Filled 2012-03-15: qty 2

## 2012-03-15 MED ORDER — HYDROMORPHONE HCL PF 1 MG/ML IJ SOLN
1.0000 mg | Freq: Once | INTRAMUSCULAR | Status: AC
  Administered 2012-03-15: 1 mg via INTRAVENOUS
  Filled 2012-03-15: qty 1

## 2012-03-15 MED ORDER — PANTOPRAZOLE SODIUM 40 MG IV SOLR
40.0000 mg | Freq: Once | INTRAVENOUS | Status: AC
  Administered 2012-03-15: 40 mg via INTRAVENOUS
  Filled 2012-03-15: qty 40

## 2012-03-15 MED ORDER — MORPHINE SULFATE 2 MG/ML IJ SOLN
2.0000 mg | Freq: Once | INTRAMUSCULAR | Status: DC
  Filled 2012-03-15: qty 1

## 2012-03-15 NOTE — Discharge Instructions (Signed)
Your bloodwork, heart numbers, EKG, chest x-ray, were normal here today with the exception of some liver numbers were elevated. Use the pain medicine as directed. Followup with her doctor.  Chest Pain, Nonspecific It is often hard to give a specific diagnosis for the cause of chest pain. There is always a chance that your pain could be related to something serious, like a heart attack or a blood clot in the lungs. You need to follow up with your caregiver for further evaluation. More lab tests or other studies such as X-rays, electrocardiography, stress testing, or cardiac imaging may be needed to find the cause of your pain. Most of the time, nonspecific chest pain improves within 2 to 3 days with rest and mild pain medicine. For the next few days, avoid physical exertion or activities that bring on pain. Do not smoke. Avoid drinking alcohol. Call your caregiver for routine follow-up as advised.  SEEK IMMEDIATE MEDICAL CARE IF:  You develop increased chest pain or pain that radiates to the arm, neck, jaw, back, or abdomen.   You develop shortness of breath, increased coughing, or you start coughing up blood.   You have severe back or abdominal pain, nausea, or vomiting.   You develop severe weakness, fainting, fever, or chills.  Document Released: 12/12/2005 Document Revised: 12/01/2011 Document Reviewed: 06/01/2007 [facility] Patient Information 2012 [place], [place].

## 2012-03-15 NOTE — ED Notes (Signed)
Patient was receiving a breathing treatment from respiratory.  Gave patient a urinal and advised we needed urine specimen.

## 2012-03-15 NOTE — ED Provider Notes (Signed)
History    This chart was scribed for [employer]. Colon Branch, MD, MD by Smitty Pluck. The patient was seen in room APA05 and the patient's care was started at 1:59PM.   CSN: [id number]  Arrival date & time 03/15/12  1343   First MD Initiated Contact with Patient 03/15/12 1354      Chief Complaint  Patient presents with  . Chest Pain    (Consider location/radiation/quality/duration/timing/severity/associated sxs/prior treatment) Patient is a 51 y.o. male presenting with chest pain. The history is provided by the patient.  Chest Pain    KELDRICK POMPLUN is a 51 y.o. male who presents to the Emergency Department complaining of moderate chest pain. The pain has been present for awhile but has gotten worse within the last week. It has been constant and radiates across chest. Pt reports the pain is sharp and stabbing. Pt has taken ibuprofen and Aleeve without relief. Pt reports that he has been SOB. The pain and SOB is aggravated by exertion. Pt reports having nausea. He states that he feels like he has to vomit but has not vomited.   Past Medical History  Diagnosis Date  . Hypertension   . GERD (gastroesophageal reflux disease)   . Pneumonia   . Diabetes mellitus     History reviewed. No pertinent past surgical history.  Family History  Problem Relation Age of Onset  . Diabetes Mother   . Hypertension Mother     History  Substance Use Topics  . Smoking status: Former Games developer  . Smokeless tobacco: Not on file  . Alcohol Use: No      Review of Systems  Cardiovascular: Positive for chest pain.  All other systems reviewed and are negative.   10 Systems reviewed and are negative for acute change except as noted in the HPI.  Allergies  Review of patient's allergies indicates no known allergies.  Home Medications   Current Outpatient Rx  Name Route Sig Dispense Refill  . OMEGA-3 FATTY ACIDS 1000 MG PO CAPS Oral Take 1 g by mouth daily.      . IBUPROFEN 800 MG PO TABS Oral  Take 800 mg by mouth every 8 (eight) hours as needed. For pain     . LISINOPRIL 10 MG PO TABS Oral Take 10 mg by mouth daily.      [name] METFORMIN HCL 500 MG PO TABS Oral Take 1,000 mg by mouth 2 (two) times daily with a meal.      . RANITIDINE HCL 300 MG PO TABS Oral Take 300 mg by mouth daily as needed. For acid reflux      BP 119/75  Pulse 111  Temp 97.6 F (36.4 C)  Resp 20  Ht 5\' 11"  (1.803 m)  Wt 199 lb (90.266 kg)  BMI 27.75 kg/m2  SpO2 95%  Physical Exam  Nursing note and vitals reviewed. Constitutional: He is oriented to person, place, and time. He appears well-developed and well-nourished. No distress.  HENT:  Head: Normocephalic and atraumatic.  Eyes: Conjunctivae are normal. Pupils are equal, round, and reactive to light.  Neck: Normal range of motion. Neck supple.  Cardiovascular: Normal rate, regular rhythm and normal heart sounds.   No murmur heard. Pulmonary/Chest: He has wheezes (expiratory intermittently). He has no rales. He exhibits tenderness (chest wall muscle anteriorly ).  Abdominal: Soft. There is tenderness (epigastric).  Neurological: He is alert and oriented to person, place, and time.  Skin: Skin is warm and dry.  Psychiatric: He has a  normal mood and affect. His behavior is normal.    ED Course  Procedures (including critical care time) DIAGNOSTIC STUDIES: Oxygen Saturation is 95% on room air, normal by my interpretation.    COORDINATION OF CARE: 2:15PM EDP orders medication: Toradol 30 mg, Zofran 4 mg, dilaudid 1 mg, protonix 40 mg, Proventil 2.5 mg   Date: 03/15/2012  1350  Rate: 101  Rhythm: sinus tachycardia  QRS Axis: normal  Intervals: normal  ST/T Wave abnormalities: normal  Conduction Disutrbances:none  Narrative Interpretation:   Old EKG Reviewed: unchanged c/w 12/23/11  Results for orders placed during the hospital encounter of 03/15/12  CBC      Component Value Range   WBC 7.8  4.0 - 10.5 (K/uL)   RBC 4.57  4.22 - 5.81 (MIL/uL)    Hemoglobin 13.7  13.0 - 17.0 (g/dL)   HCT 14.7  82.9 - 56.2 (%)   MCV 85.8  78.0 - 100.0 (fL)   MCH 30.0  26.0 - 34.0 (pg)   MCHC 34.9  30.0 - 36.0 (g/dL)   RDW 13.0  86.5 - 78.4 (%)   Platelets 247  150 - 400 (K/uL)  COMPREHENSIVE METABOLIC PANEL      Component Value Range   Sodium 132 (*) 135 - 145 (mEq/L)   Potassium 3.9  3.5 - 5.1 (mEq/L)   Chloride 95 (*) 96 - 112 (mEq/L)   CO2 24  19 - 32 (mEq/L)   Glucose, Bld 154 (*) 70 - 99 (mg/dL)   BUN 20  6 - 23 (mg/dL)   Creatinine, Ser 6.96  0.50 - 1.35 (mg/dL)   Calcium 29.5 (*) 8.4 - 10.5 (mg/dL)   Total Protein 8.0  6.0 - 8.3 (g/dL)   Albumin 4.4  3.5 - 5.2 (g/dL)   AST 284 (*) 0 - 37 (U/L)   ALT 166 (*) 0 - 53 (U/L)   Alkaline Phosphatase 96  39 - 117 (U/L)   Total Bilirubin 1.0  0.3 - 1.2 (mg/dL)   GFR calc non Af Amer 83 (*) >90 (mL/min)   GFR calc Af Amer >90  >90 (mL/min)  CARDIAC PANEL(CRET KIN+CKTOT+MB+TROPI)      Component Value Range   Total CK 121  7 - 232 (U/L)   CK, MB 1.6  0.3 - 4.0 (ng/mL)   Troponin I <0.30  <0.30 (ng/mL)   Relative Index 1.3  0.0 - 2.5   POCT I-STAT TROPONIN I      Component Value Range   Troponin i, poc 0.00  0.00 - 0.08 (ng/mL)   Comment 3             Dg Chest Portable 1 View  03/15/2012  *RADIOLOGY REPORT*  Clinical Data: Chest pain.  PORTABLE CHEST - 1 VIEW  Comparison: 12/23/2011.  Findings: The heart size is normal.  The lungs are clear.  The visualized soft tissues and bony thorax are unremarkable.  IMPRESSION: Negative chest.  Original Report Authenticated By: Jamesetta Orleans. MATTERN, M.D.     No diagnosis found.    MDM  Patient with sharp chest pain for several weeks worse over the past 24 hours. Given IVF, analgesics, antiemetic with some relief. Labs unremarkable except for elevated LFTs. EKG unremarkable, negative troponin, benign chest xray. Reviewed results with patient. Pt stable in ED with no significant deterioration in condition.The patient appears reasonably screened  and/or stabilized for discharge and I doubt any other medical condition or other [facility] requiring further screening, evaluation, or treatment in the ED  at this time prior to discharge.  I personally performed the services described in this documentation, which was scribed in my presence. The recorded information has been reviewed and considered.  MDM Reviewed: nursing note and vitals Interpretation: labs, ECG and x-ray         Nicoletta Dress. Colon Branch, MD 03/15/12 1549

## 2012-03-15 NOTE — ED Notes (Signed)
Pt c/o chest pain, shortness of breath and nausea x 1 week.

## 2012-07-16 ENCOUNTER — Encounter (HOSPITAL_COMMUNITY): Payer: Self-pay | Admitting: Emergency Medicine

## 2012-07-16 ENCOUNTER — Emergency Department (HOSPITAL_COMMUNITY): Payer: Self-pay

## 2012-07-16 ENCOUNTER — Emergency Department (HOSPITAL_COMMUNITY)
Admission: EM | Admit: 2012-07-16 | Discharge: 2012-07-16 | Disposition: A | Discharge status: Home / Self Care | Payer: Self-pay | Attending: Emergency Medicine | Admitting: Emergency Medicine

## 2012-07-16 DIAGNOSIS — E119 Type 2 diabetes mellitus without complications: Secondary | ICD-10-CM | POA: Insufficient documentation

## 2012-07-16 DIAGNOSIS — K219 Gastro-esophageal reflux disease without esophagitis: Secondary | ICD-10-CM | POA: Insufficient documentation

## 2012-07-16 DIAGNOSIS — I10 Essential (primary) hypertension: Secondary | ICD-10-CM | POA: Insufficient documentation

## 2012-07-16 DIAGNOSIS — Z794 Long term (current) use of insulin: Secondary | ICD-10-CM | POA: Insufficient documentation

## 2012-07-16 DIAGNOSIS — Z79899 Other long term (current) drug therapy: Secondary | ICD-10-CM | POA: Insufficient documentation

## 2012-07-16 DIAGNOSIS — R059 Cough, unspecified: Secondary | ICD-10-CM | POA: Insufficient documentation

## 2012-07-16 DIAGNOSIS — R0602 Shortness of breath: Secondary | ICD-10-CM | POA: Insufficient documentation

## 2012-07-16 DIAGNOSIS — R05 Cough: Secondary | ICD-10-CM | POA: Insufficient documentation

## 2012-07-16 DIAGNOSIS — Z7982 Long term (current) use of aspirin: Secondary | ICD-10-CM | POA: Insufficient documentation

## 2012-07-16 LAB — CBC WITH DIFFERENTIAL/PLATELET
Basophils Absolute: 0.1 10*3/uL (ref 0.0–0.1)
Basophils Relative: 1 % (ref 0–1)
HCT: 38.4 % — ABNORMAL LOW (ref 39.0–52.0)
Lymphocytes Relative: 41 % (ref 12–46)
MCHC: 36.2 g/dL — ABNORMAL HIGH (ref 30.0–36.0)
Neutro Abs: 2.8 10*3/uL (ref 1.7–7.7)
Neutrophils Relative %: 44 % (ref 43–77)
Platelets: 250 10*3/uL (ref 150–400)
RDW: 11.7 % (ref 11.5–15.5)
WBC: 6.5 10*3/uL (ref 4.0–10.5)

## 2012-07-16 LAB — BASIC METABOLIC PANEL
CO2: 26 mEq/L (ref 19–32)
Chloride: 92 mEq/L — ABNORMAL LOW (ref 96–112)
Creatinine, Ser: 1.15 mg/dL (ref 0.50–1.35)
GFR calc Af Amer: 83 mL/min — ABNORMAL LOW (ref >90)
Potassium: 4.7 mEq/L (ref 3.5–5.1)
Sodium: 128 mEq/L — ABNORMAL LOW (ref 135–145)

## 2012-07-16 LAB — GLUCOSE, CAPILLARY
Glucose-Capillary: 334 mg/dL — ABNORMAL HIGH (ref 70–99)
Glucose-Capillary: 216 mg/dL — ABNORMAL HIGH (ref 70–99)

## 2012-07-16 MED ORDER — NAPROXEN 375 MG PO TABS: 375.0000 mg | ORAL_TABLET | Freq: Two times a day (BID) | ORAL | Status: DC

## 2012-07-16 MED ORDER — CYCLOBENZAPRINE HCL 10 MG PO TABS: 10.0000 mg | ORAL_TABLET | Freq: Two times a day (BID) | ORAL | Status: AC | PRN

## 2012-07-16 MED ORDER — ONDANSETRON HCL 4 MG/2ML IJ SOLN
4.0000 mg | Freq: Once | INTRAMUSCULAR | Status: AC
  Administered 2012-07-16: 4 mg via INTRAVENOUS
  Filled 2012-07-16: qty 2

## 2012-07-16 MED ORDER — INSULIN REGULAR HUMAN 100 UNIT/ML IJ SOLN: 5.0000 [IU] | Freq: Once | INTRAMUSCULAR | Status: DC

## 2012-07-16 MED ORDER — SODIUM CHLORIDE 0.9 % IV BOLUS (SEPSIS)
1000.0000 mL | Freq: Once | INTRAVENOUS | Status: AC
  Administered 2012-07-16: 1000 mL via INTRAVENOUS

## 2012-07-16 MED ORDER — KETOROLAC TROMETHAMINE 30 MG/ML IJ SOLN
30.0000 mg | Freq: Once | INTRAMUSCULAR | Status: AC
  Administered 2012-07-16: 30 mg via INTRAVENOUS
  Filled 2012-07-16: qty 1

## 2012-07-16 MED ORDER — HYDROMORPHONE HCL PF 1 MG/ML IJ SOLN
1.0000 mg | Freq: Once | INTRAMUSCULAR | Status: AC
  Administered 2012-07-16: 1 mg via INTRAVENOUS
  Filled 2012-07-16: qty 1

## 2012-07-16 MED ORDER — INSULIN ASPART 100 UNIT/ML ~~LOC~~ SOLN
5.0000 [IU] | Freq: Once | SUBCUTANEOUS | Status: AC
  Administered 2012-07-16: 5 [IU] via SUBCUTANEOUS
  Filled 2012-07-16: qty 1

## 2012-07-16 NOTE — ED Notes (Signed)
Pt c/o cough with non productive sputum production for over a week, left side flank and abd pain for "a while", elevated blood sugar for two weeks, pt is followed at health department,

## 2012-07-16 NOTE — ED Notes (Signed)
Pt is constantly on cell phone in triage.

## 2012-07-16 NOTE — ED Notes (Signed)
Patient is waiting on discharge at this time.

## 2012-07-16 NOTE — ED Notes (Signed)
Patient states he is feeling a little better.  

## 2012-07-16 NOTE — ED Notes (Signed)
Pt c/o cough, sob and high blood sugars x one week.

## 2012-07-16 NOTE — ED Notes (Signed)
Family at bedside. 

## 2012-07-16 NOTE — ED Provider Notes (Signed)
History   This chart was scribed for Robert Hutching, MD by Sofie Rower. The patient was seen in room APA05/APA05 and the patient's care was started at 11:25 AM     CSN: [id number]  Arrival date & time 07/16/12  2841   First MD Initiated Contact with Patient 07/16/12 1110      Chief Complaint  Patient presents with  . Shortness of Breath  . Cough  . Hyperglycemia    (Consider location/radiation/quality/duration/timing/severity/associated sxs/prior treatment) HPI  Robert Lyons is a 51 y.o. male who presents to the Emergency Department complaining of moderate, episodic hyperglycemia onset one week ago with associated symptoms of abdominal pain located at the LUQ, shortness of breath, cough, increased urinary frequency. The pt informs the EDP that his sugar levels have been running in the 400's for the past week.  Modifying factors include taking insulin (10 units AM, 20 units PM / day) which provides moderate relief.  Pt has a hx of diabetes, hypertension.   Past Medical History  Diagnosis Date  . Hypertension   . GERD (gastroesophageal reflux disease)   . Pneumonia   . Diabetes mellitus     History reviewed. No pertinent past surgical history.  Family History  Problem Relation Age of Onset  . Diabetes Mother   . Hypertension Mother     History  Substance Use Topics  . Smoking status: Former Games developer  . Smokeless tobacco: Not on file  . Alcohol Use: No      Review of Systems  All other systems reviewed and are negative.    10 Systems reviewed and all are negative for acute change except as noted in the HPI.    Allergies  Review of patient's allergies indicates no known allergies.  Home Medications   Current Outpatient Rx  Name Route Sig Dispense Refill  . ASPIRIN EC 81 MG PO TBEC Oral Take 81 mg by mouth daily.    . IBUPROFEN 800 MG PO TABS Oral Take 800 mg by mouth every 8 (eight) hours as needed. For pain     . INSULIN ASPART 100 UNIT/ML [place] SOLN  Subcutaneous Inject 10 Units into the skin 2 (two) times daily.    . INSULIN DETEMIR 100 UNIT/ML [place] SOLN Subcutaneous Inject 20 Units into the skin at bedtime.    [name] LISINOPRIL 10 MG PO TABS Oral Take 10 mg by mouth daily.      [name] RANITIDINE HCL 300 MG PO TABS Oral Take 300 mg by mouth daily as needed. For acid reflux      BP 138/62  Pulse 91  Temp 97.7 F (36.5 C)  Resp 20  Ht 5\' 11"  (1.803 m)  Wt 200 lb (90.719 kg)  BMI 27.89 kg/m2  SpO2 99%  Physical Exam  Nursing note and vitals reviewed. Constitutional: He is oriented to person, place, and time. He appears well-developed and well-nourished.       Slightly dehydrated.    HENT:  Head: Atraumatic.  Right Ear: External ear normal.  Left Ear: External ear normal.  Nose: Nose normal.  Neck: Normal range of motion.  Abdominal: Soft. There is tenderness (LUQ).  Musculoskeletal: Normal range of motion.  Neurological: He is alert and oriented to person, place, and time.  Skin: Skin is warm and dry.  Psychiatric: He has a normal mood and affect. His behavior is normal.    ED Course  Procedures (including critical care time)  DIAGNOSTIC STUDIES: Oxygen Saturation is 99% on room air, normal  by my interpretation.    COORDINATION OF CARE:     11:30AM- EDP at bedside discusses treatment plan Concerning IV fluids, x-ray of lungs, blood work.   Labs Reviewed  GLUCOSE, CAPILLARY - Abnormal; Notable for the following:    Glucose-Capillary 459 (*)     All other components within normal limits  CBC WITH DIFFERENTIAL - Abnormal; Notable for the following:    HCT 38.4 (*)     MCHC 36.2 (*)     All other components within normal limits  BASIC METABOLIC PANEL - Abnormal; Notable for the following:    Sodium 128 (*)     Chloride 92 (*)     Glucose, Bld 422 (*)     BUN 31 (*)     Calcium 10.6 (*)     GFR calc non Af Amer 72 (*)     GFR calc Af Amer 83 (*)     All other components within normal limits  GLUCOSE, CAPILLARY -  Abnormal; Notable for the following:    Glucose-Capillary 334 (*)     All other components within normal limits   No results found.   No diagnosis found.    MDM  Patient is hemodynamically stable. Glucose is poorly controlled. No signs of DKA. Left lateral lower chest discomfort is fleeting and suspicious for muscle spasm. Discharge him with Naprosyn and Flexeril. followup with health department for her glucose control      I personally performed the services described in this documentation, which was scribed in my presence. The recorded information has been reviewed and considered.    Robert Hutching, MD 07/16/12 1504

## 2012-09-26 IMAGING — CR DG CHEST 1V PORT
1 series · 1 of 1 positions shown · non-contrast
Comparison: 12/23/2011.

CLINICAL DATA: Chest pain.

PORTABLE CHEST - 1 VIEW

[view not recorded]
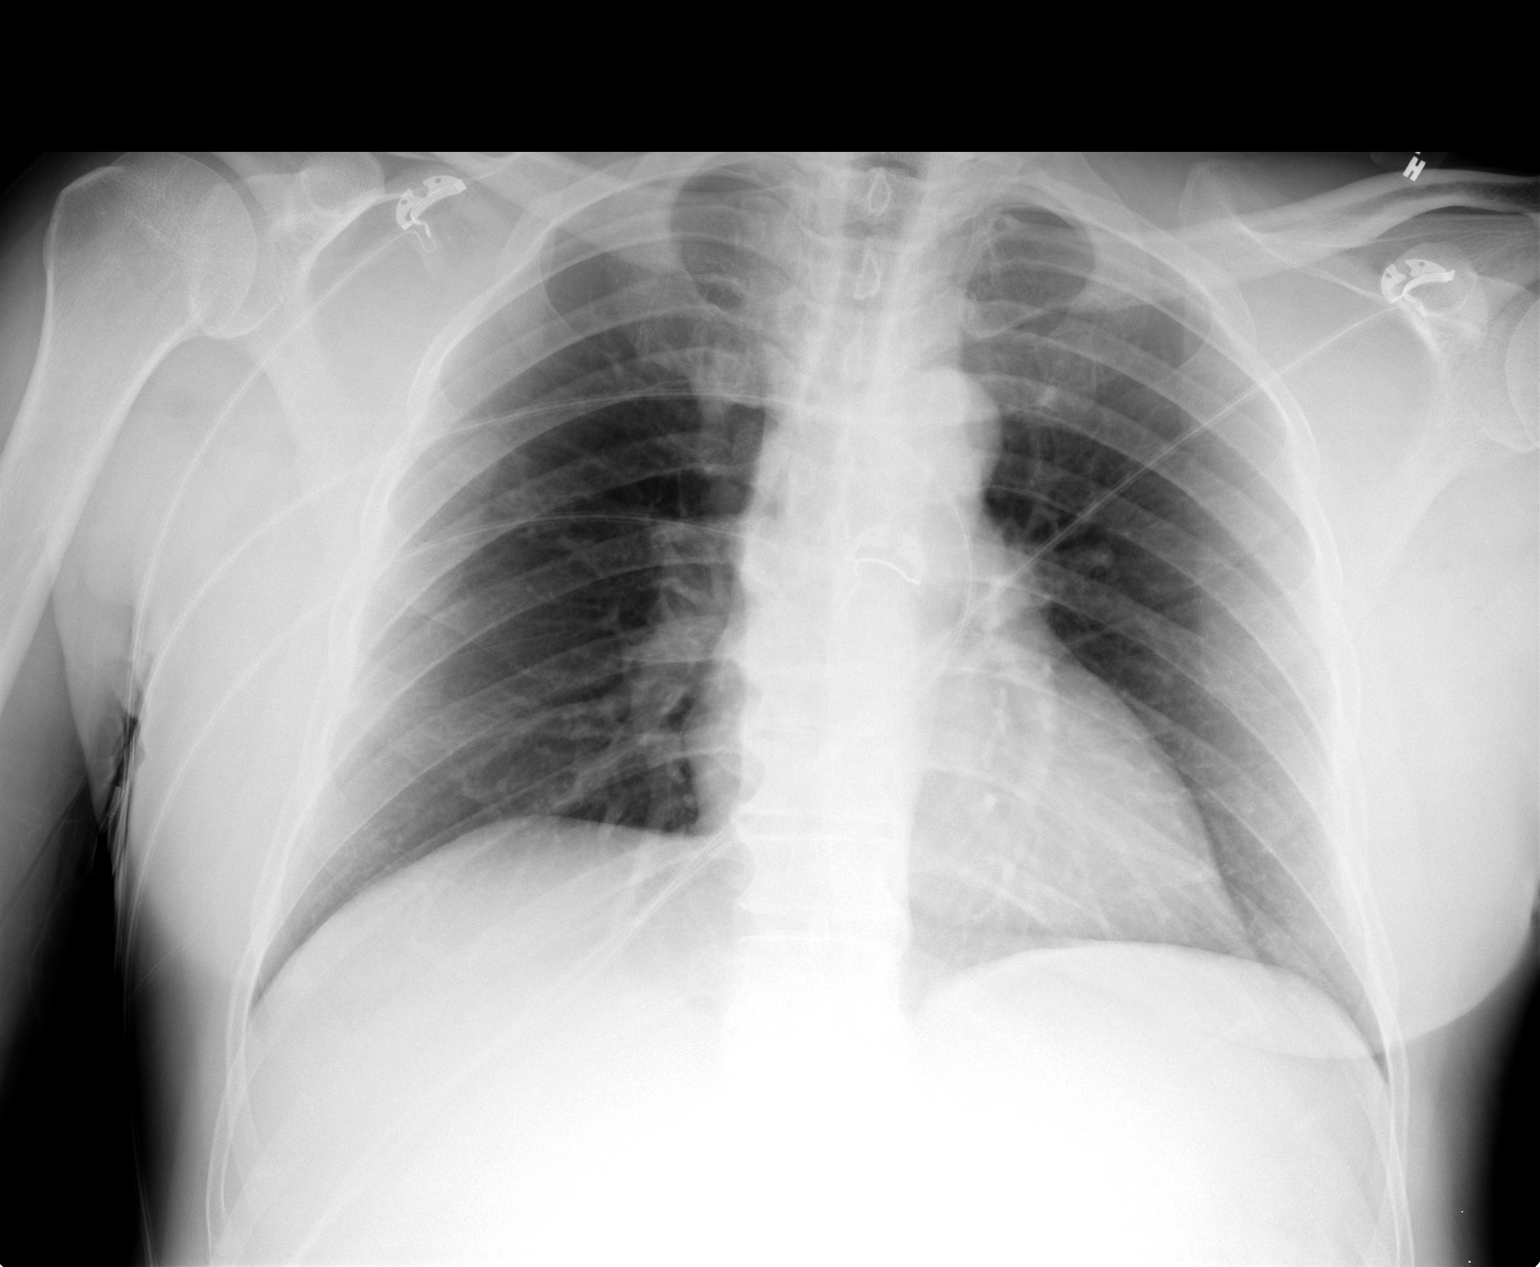

[1 of 1 positions shown; findings below may reference images not displayed]

FINDINGS: The heart size is normal.  The lungs are clear.  The
visualized soft tissues and bony thorax are unremarkable.
IMPRESSION: Negative chest.

## 2012-10-17 ENCOUNTER — Emergency Department (HOSPITAL_COMMUNITY)
Admission: EM | Admit: 2012-10-17 | Discharge: 2012-10-18 | Disposition: A | Discharge status: Home / Self Care | Payer: Self-pay | Attending: Emergency Medicine | Admitting: Emergency Medicine

## 2012-10-17 ENCOUNTER — Emergency Department (HOSPITAL_COMMUNITY): Payer: Self-pay

## 2012-10-17 ENCOUNTER — Encounter (HOSPITAL_COMMUNITY): Payer: Self-pay | Admitting: *Deleted

## 2012-10-17 DIAGNOSIS — Z8701 Personal history of pneumonia (recurrent): Secondary | ICD-10-CM | POA: Insufficient documentation

## 2012-10-17 DIAGNOSIS — I10 Essential (primary) hypertension: Secondary | ICD-10-CM | POA: Insufficient documentation

## 2012-10-17 DIAGNOSIS — Z794 Long term (current) use of insulin: Secondary | ICD-10-CM | POA: Insufficient documentation

## 2012-10-17 DIAGNOSIS — R079 Chest pain, unspecified: Secondary | ICD-10-CM

## 2012-10-17 DIAGNOSIS — R05 Cough: Secondary | ICD-10-CM | POA: Insufficient documentation

## 2012-10-17 DIAGNOSIS — R071 Chest pain on breathing: Secondary | ICD-10-CM | POA: Insufficient documentation

## 2012-10-17 DIAGNOSIS — Z79899 Other long term (current) drug therapy: Secondary | ICD-10-CM | POA: Insufficient documentation

## 2012-10-17 DIAGNOSIS — Z8719 Personal history of other diseases of the digestive system: Secondary | ICD-10-CM | POA: Insufficient documentation

## 2012-10-17 DIAGNOSIS — Z87891 Personal history of nicotine dependence: Secondary | ICD-10-CM | POA: Insufficient documentation

## 2012-10-17 DIAGNOSIS — R059 Cough, unspecified: Secondary | ICD-10-CM | POA: Insufficient documentation

## 2012-10-17 DIAGNOSIS — E119 Type 2 diabetes mellitus without complications: Secondary | ICD-10-CM | POA: Insufficient documentation

## 2012-10-17 DIAGNOSIS — Z7982 Long term (current) use of aspirin: Secondary | ICD-10-CM | POA: Insufficient documentation

## 2012-10-17 LAB — CBC WITH DIFFERENTIAL/PLATELET
Basophils Relative: 1 % (ref 0–1)
Eosinophils Absolute: 0.2 10*3/uL (ref 0.0–0.7)
Hemoglobin: 12.6 g/dL — ABNORMAL LOW (ref 13.0–17.0)
Lymphs Abs: 2.1 10*3/uL (ref 0.7–4.0)
MCH: 30.7 pg (ref 26.0–34.0)
Monocytes Relative: 9 % (ref 3–12)
Neutro Abs: 3.3 10*3/uL (ref 1.7–7.7)
Neutrophils Relative %: 53 % (ref 43–77)
Platelets: 256 10*3/uL (ref 150–400)
RBC: 4.11 MIL/uL — ABNORMAL LOW (ref 4.22–5.81)
WBC: 6.1 10*3/uL (ref 4.0–10.5)

## 2012-10-17 LAB — BASIC METABOLIC PANEL
Chloride: 102 mEq/L (ref 96–112)
GFR calc Af Amer: >90 mL/min (ref >90)
GFR calc non Af Amer: 84 mL/min — ABNORMAL LOW (ref >90)
Glucose, Bld: 167 mg/dL — ABNORMAL HIGH (ref 70–99)
Potassium: 3.2 mEq/L — ABNORMAL LOW (ref 3.5–5.1)
Sodium: 139 mEq/L (ref 135–145)

## 2012-10-17 MED ORDER — ONDANSETRON HCL 4 MG/2ML IJ SOLN
4.0000 mg | Freq: Once | INTRAMUSCULAR | Status: AC
  Administered 2012-10-17: 4 mg via INTRAVENOUS
  Filled 2012-10-17: qty 2

## 2012-10-17 MED ORDER — MORPHINE SULFATE 4 MG/ML IJ SOLN
4.0000 mg | Freq: Once | INTRAMUSCULAR | Status: AC
  Administered 2012-10-17: 4 mg via INTRAVENOUS
  Filled 2012-10-17: qty 1

## 2012-10-17 NOTE — ED Notes (Signed)
Chest pain for 2-3 days, cough for 1-2 weeks.  Nonproductive.   Also lt flank pain for 3-4 days  No NVD

## 2012-10-18 MED ORDER — HYDROCODONE-ACETAMINOPHEN 5-325 MG PO TABS: ORAL_TABLET | ORAL | Status: DC

## 2012-10-18 MED ORDER — BENZONATATE 200 MG PO CAPS: 200.0000 mg | ORAL_CAPSULE | Freq: Three times a day (TID) | ORAL | Status: DC | PRN

## 2012-10-18 NOTE — ED Provider Notes (Signed)
Medical screening examination/treatment/procedure(s) were performed by non-physician practitioner and as supervising physician I was immediately available for consultation/collaboration. Amil Bouwman, MD, FACEP   Husam Hohn L Annesha Delgreco, MD 10/18/12 2025 

## 2012-10-18 NOTE — ED Provider Notes (Signed)
History     CSN: [id number]  Arrival date & time 10/17/12  2132   First MD Initiated Contact with Patient 10/17/12 2239      Chief Complaint  Patient presents with  . Chest Pain    (Consider location/radiation/quality/duration/timing/severity/associated sxs/prior treatment) HPI Comments: Patient c/o persistent cough for 1-2 weeks. States he feels "my chest rattles when I cough but I can't cough it up".  Cough is worse with exertion and with lying down.  He also c/o upper chest pain for 2-3 days that he describes as sharp and stabbing at times.  Chest pain is also reproduced with palpation to the chest.  He states that he has had similar episodes of chest pain in the past and treated with muscle relaxers.  He denies shortness of breath, sweating, N/V, numbness or weakness  Patient is a 51 y.o. male presenting with chest pain. The history is provided by the patient.  Chest Pain The chest pain began 2 days ago. Chest pain occurs intermittently. The chest pain is unchanged. The pain is associated with coughing. The severity of the pain is moderate. The quality of the pain is described as sharp and stabbing. The pain radiates to the upper back. Exacerbated by: palpation and coughing. Primary symptoms include cough. Pertinent negatives for primary symptoms include no fever, no fatigue, no syncope, no shortness of breath, no wheezing, no palpitations, no abdominal pain, no nausea, no vomiting, no dizziness and no altered mental status.  The cough began more than 1 week ago. The cough is new. The cough is non-productive.  Pertinent negatives for associated symptoms include no lower extremity edema, no near-syncope, no numbness, no orthopnea and no weakness. He tried nothing for the symptoms.  Pertinent negatives for past medical history include no PE.     Past Medical History  Diagnosis Date  . Hypertension   . GERD (gastroesophageal reflux disease)   . Pneumonia   . Diabetes mellitus      History reviewed. No pertinent past surgical history.  Family History  Problem Relation Age of Onset  . Diabetes Mother   . Hypertension Mother     History  Substance Use Topics  . Smoking status: Former Games developer  . Smokeless tobacco: Not on file  . Alcohol Use: No      Review of Systems  Constitutional: Negative for fever, activity change, appetite change and fatigue.  HENT: Negative for neck pain.   Respiratory: Positive for cough and chest tightness. Negative for shortness of breath and wheezing.   Cardiovascular: Positive for chest pain. Negative for palpitations, orthopnea, syncope and near-syncope.  Gastrointestinal: Negative for nausea, vomiting, abdominal pain and abdominal distention.  Genitourinary: Negative for difficulty urinating.  Musculoskeletal: Negative for arthralgias.  Skin: Negative for color change and rash.  Neurological: Negative for dizziness, syncope, speech difficulty, weakness, light-headedness, numbness and headaches.  Psychiatric/Behavioral: Negative for altered mental status.  All other systems reviewed and are negative.    Allergies  Review of patient's allergies indicates no known allergies.  Home Medications   Current Outpatient Rx  Name Route Sig Dispense Refill  . ACETAMINOPHEN 500 MG PO TABS Oral Take 500-1,000 mg by mouth every 6 (six) hours as needed. For pain    . ASPIRIN EC 81 MG PO TBEC Oral Take 81 mg by mouth daily.    . OMEGA-3 FATTY ACIDS 1000 MG PO CAPS Oral Take 1 g by mouth daily.    . IBUPROFEN 800 MG PO TABS Oral Take 800  mg by mouth every 8 (eight) hours as needed. For pain     . INSULIN ASPART 100 UNIT/ML [place] SOLN Subcutaneous Inject 10 Units into the skin 3 (three) times daily. Take 10 units three times daily    . INSULIN DETEMIR 100 UNIT/ML [place] SOLN Subcutaneous Inject 30 Units into the skin at bedtime.     [name] LISINOPRIL 10 MG PO TABS Oral Take 10 mg by mouth daily.        BP 153/84  Pulse 67  Temp 98.6 F (37  C) (Oral)  Resp 18  Ht 5\' 11"  (1.803 m)  Wt 210 lb (95.255 kg)  BMI 29.29 kg/m2  SpO2 98%  Physical Exam  Nursing note and vitals reviewed. Constitutional: He is oriented to person, place, and time. He appears well-developed and well-nourished. No distress.  HENT:  Head: Normocephalic and atraumatic.  Mouth/Throat: Oropharynx is clear and moist.  Neck: Normal range of motion. Neck supple.  Cardiovascular: Normal rate, regular rhythm, normal heart sounds and intact distal pulses.   No murmur heard. Pulmonary/Chest: Effort normal and breath sounds normal. No respiratory distress. Chest wall is not dull to percussion. He exhibits tenderness. He exhibits no mass, no crepitus, no edema, no deformity, no swelling and no retraction.    Abdominal: Soft. He exhibits no distension and no mass. There is no tenderness. There is no rebound and no guarding.  Musculoskeletal: Normal range of motion.  Lymphadenopathy:    He has no cervical adenopathy.  Neurological: He is alert and oriented to person, place, and time. He exhibits normal muscle tone. Coordination normal.  Skin: Skin is warm and dry.    ED Course  Procedures (including critical care time)  Results for orders placed during the hospital encounter of 10/17/12  CBC WITH DIFFERENTIAL      Component Value Range   WBC 6.1  4.0 - 10.5 K/uL   RBC 4.11 (*) 4.22 - 5.81 MIL/uL   Hemoglobin 12.6 (*) 13.0 - 17.0 g/dL   HCT 52.8 (*) 41.3 - 24.4 %   MCV 88.1  78.0 - 100.0 fL   MCH 30.7  26.0 - 34.0 pg   MCHC 34.8  30.0 - 36.0 g/dL   RDW 01.0  27.2 - 53.6 %   Platelets 256  150 - 400 K/uL   Neutrophils Relative 53  43 - 77 %   Neutro Abs 3.3  1.7 - 7.7 K/uL   Lymphocytes Relative 35  12 - 46 %   Lymphs Abs 2.1  0.7 - 4.0 K/uL   Monocytes Relative 9  3 - 12 %   Monocytes Absolute 0.5  0.1 - 1.0 K/uL   Eosinophils Relative 3  0 - 5 %   Eosinophils Absolute 0.2  0.0 - 0.7 K/uL   Basophils Relative 1  0 - 1 %   Basophils Absolute 0.0   0.0 - 0.1 K/uL  BASIC METABOLIC PANEL      Component Value Range   Sodium 139  135 - 145 mEq/L   Potassium 3.2 (*) 3.5 - 5.1 mEq/L   Chloride 102  96 - 112 mEq/L   CO2 25  19 - 32 mEq/L   Glucose, Bld 167 (*) 70 - 99 mg/dL   BUN 8  6 - 23 mg/dL   Creatinine, Ser 6.44  0.50 - 1.35 mg/dL   Calcium 9.8  8.4 - 03.4 mg/dL   GFR calc non Af Amer 84 (*) >90 mL/min   GFR calc Af  Amer >90  >90 mL/min  TROPONIN I      Component Value Range   Troponin I <0.30  <0.30 ng/mL    Dg Chest 2 View  10/18/2012  *RADIOLOGY REPORT*  Clinical Data: Chest pain.  CHEST - 2 VIEW  Comparison: 07/16/2012  Findings: Two views of the chest demonstrate clear lungs.  Stable appearance of the heart and mediastinum.  Trachea is midline. Degenerative changes in the thoracic spine.  IMPRESSION: No acute chest findings.   Original Report Authenticated By: Richarda Overlie, M.D.         MDM     Date: 10/18/2012  Rate:75  Rhythm: normal sinus rhythm  QRS Axis: normal  Intervals: normal  ST/T Wave abnormalities: normal  Conduction Disutrbances:none  Narrative Interpretation:   Old EKG Reviewed: previous EKG showed sinus tach o/w nml    EKG reviewed by Dr. Lars Mage   Previous ED charts reviewed.  Patient is feeling better.  No hypoxia, tachycardia or tachypnea.  Clinical suspicion for PE or acute cardiac event  is low.  Pt seen here previously for similar chest wall pain.  I will treat with cough medication and short course of pain medication.  He agrees to close f/u with his PMD.  Advised him to return here if the sx's worsen  Prescribed:  Tessalon Perles norco #[id number]    Basilio Meadow L. Aseel Uhde, PA 10/18/12 0111

## 2012-10-18 NOTE — ED Notes (Signed)
Discharge instructions reviewed with pt, questions answered. Pt verbalized understanding.  

## 2012-11-19 ENCOUNTER — Emergency Department (HOSPITAL_COMMUNITY): Payer: Self-pay

## 2012-11-19 ENCOUNTER — Emergency Department (HOSPITAL_COMMUNITY)
Admission: EM | Admit: 2012-11-19 | Discharge: 2012-11-19 | Disposition: A | Discharge status: Home / Self Care | Payer: Self-pay | Attending: Emergency Medicine | Admitting: Emergency Medicine

## 2012-11-19 ENCOUNTER — Other Ambulatory Visit: Payer: Self-pay

## 2012-11-19 ENCOUNTER — Encounter (HOSPITAL_COMMUNITY): Payer: Self-pay | Admitting: Emergency Medicine

## 2012-11-19 DIAGNOSIS — Z79899 Other long term (current) drug therapy: Secondary | ICD-10-CM | POA: Insufficient documentation

## 2012-11-19 DIAGNOSIS — R531 Weakness: Secondary | ICD-10-CM

## 2012-11-19 DIAGNOSIS — E119 Type 2 diabetes mellitus without complications: Secondary | ICD-10-CM

## 2012-11-19 DIAGNOSIS — Z87891 Personal history of nicotine dependence: Secondary | ICD-10-CM | POA: Insufficient documentation

## 2012-11-19 DIAGNOSIS — G43109 Migraine with aura, not intractable, without status migrainosus: Secondary | ICD-10-CM | POA: Insufficient documentation

## 2012-11-19 DIAGNOSIS — E1169 Type 2 diabetes mellitus with other specified complication: Secondary | ICD-10-CM | POA: Insufficient documentation

## 2012-11-19 DIAGNOSIS — R51 Headache: Secondary | ICD-10-CM | POA: Insufficient documentation

## 2012-11-19 DIAGNOSIS — Z7982 Long term (current) use of aspirin: Secondary | ICD-10-CM | POA: Insufficient documentation

## 2012-11-19 DIAGNOSIS — I10 Essential (primary) hypertension: Secondary | ICD-10-CM | POA: Insufficient documentation

## 2012-11-19 DIAGNOSIS — R748 Abnormal levels of other serum enzymes: Secondary | ICD-10-CM | POA: Insufficient documentation

## 2012-11-19 DIAGNOSIS — Z8701 Personal history of pneumonia (recurrent): Secondary | ICD-10-CM | POA: Insufficient documentation

## 2012-11-19 DIAGNOSIS — Z794 Long term (current) use of insulin: Secondary | ICD-10-CM | POA: Insufficient documentation

## 2012-11-19 DIAGNOSIS — K219 Gastro-esophageal reflux disease without esophagitis: Secondary | ICD-10-CM | POA: Insufficient documentation

## 2012-11-19 DIAGNOSIS — M6281 Muscle weakness (generalized): Secondary | ICD-10-CM | POA: Insufficient documentation

## 2012-11-19 DIAGNOSIS — R739 Hyperglycemia, unspecified: Secondary | ICD-10-CM

## 2012-11-19 LAB — DIFFERENTIAL
Basophils Absolute: 0 10*3/uL (ref 0.0–0.1)
Basophils Relative: 0 % (ref 0–1)
Eosinophils Relative: 2 % (ref 0–5)
Monocytes Absolute: 0.7 10*3/uL (ref 0.1–1.0)
Neutro Abs: 2.1 10*3/uL (ref 1.7–7.7)

## 2012-11-19 LAB — COMPREHENSIVE METABOLIC PANEL
ALT: 72 U/L — ABNORMAL HIGH (ref 0–53)
AST: 76 U/L — ABNORMAL HIGH (ref 0–37)
Albumin: 4 g/dL (ref 3.5–5.2)
Calcium: 9.7 mg/dL (ref 8.4–10.5)
Chloride: 103 mEq/L (ref 96–112)
Creatinine, Ser: 0.82 mg/dL (ref 0.50–1.35)
Sodium: 140 mEq/L (ref 135–145)

## 2012-11-19 LAB — APTT: aPTT: 27 seconds (ref 24–37)

## 2012-11-19 LAB — RAPID URINE DRUG SCREEN, HOSP PERFORMED
Amphetamines: NOT DETECTED
Barbiturates: NOT DETECTED
Benzodiazepines: NOT DETECTED
Cocaine: NOT DETECTED
Opiates: POSITIVE — AB
Tetrahydrocannabinol: NOT DETECTED

## 2012-11-19 LAB — CBC
HCT: 36.4 % — ABNORMAL LOW (ref 39.0–52.0)
MCHC: 34.3 g/dL (ref 30.0–36.0)
Platelets: 206 10*3/uL (ref 150–400)
RDW: 12.2 % (ref 11.5–15.5)
WBC: 4.5 10*3/uL (ref 4.0–10.5)

## 2012-11-19 LAB — PROTIME-INR
INR: 1.05 (ref 0.00–1.49)
Prothrombin Time: 13.6 seconds (ref 11.6–15.2)

## 2012-11-19 MED ORDER — HYDROMORPHONE HCL PF 1 MG/ML IJ SOLN
1.0000 mg | Freq: Once | INTRAMUSCULAR | Status: AC
  Administered 2012-11-19: 1 mg via INTRAVENOUS
  Filled 2012-11-19: qty 1

## 2012-11-19 MED ORDER — ONDANSETRON HCL 4 MG/2ML IJ SOLN
4.0000 mg | Freq: Once | INTRAMUSCULAR | Status: AC
  Administered 2012-11-19: 4 mg via INTRAVENOUS
  Filled 2012-11-19: qty 2

## 2012-11-19 MED ORDER — ASPIRIN EC 325 MG PO TBEC: 325.0000 mg | DELAYED_RELEASE_TABLET | Freq: Every day | ORAL | Status: DC

## 2012-11-19 MED ORDER — OXYCODONE-ACETAMINOPHEN 5-325 MG PO TABS: 2.0000 | ORAL_TABLET | ORAL | Status: DC | PRN

## 2012-11-19 MED ORDER — METOCLOPRAMIDE HCL 10 MG PO TABS: 10.0000 mg | ORAL_TABLET | Freq: Four times a day (QID) | ORAL | Status: DC | PRN

## 2012-11-19 MED ORDER — METOCLOPRAMIDE HCL 5 MG/ML IJ SOLN
10.0000 mg | Freq: Once | INTRAMUSCULAR | Status: AC
Start: 2012-11-19 — End: 2012-11-19
  Administered 2012-11-19: 10 mg via INTRAVENOUS
  Filled 2012-11-19: qty 2

## 2012-11-19 MED ORDER — DIPHENHYDRAMINE HCL 50 MG/ML IJ SOLN
25.0000 mg | Freq: Once | INTRAMUSCULAR | Status: AC
  Administered 2012-11-19: 25 mg via INTRAVENOUS
  Filled 2012-11-19: qty 1

## 2012-11-19 NOTE — ED Notes (Signed)
Pt c/o pain above left eye x 3 days. Pt also c/o nausea.

## 2012-11-19 NOTE — ED Notes (Signed)
Hung bag of nss to run while IVP medications were given.  Pt now has saline lock as ordered.

## 2012-11-19 NOTE — ED Provider Notes (Signed)
History   This chart was scribed for No att. providers found by Donne Anon, ED Scribe. This patient was seen in room APA11/APA11 and the patient's care was started at 5:07 PM.   CSN: [id number]  Arrival date & time 11/19/12  1649   First MD Initiated Contact with Patient 11/19/12 1707      Chief Complaint  Patient presents with  . Headache   The history is provided by the patient. No language interpreter was used.   Robert Lyons is a 50 y.o. male who presents to the Emergency Department complaining of a gradual onset, constant, severe headache, which is concentrated on the left side of head behind the left eye. It was preceeded with a visual aura, which included blurred vision and color changes, and began 4 days ago. The patient states this has been happening for about once or twice a month for the past year after hitting his head. He states they usually last 1 day. He complains of weakness/tingling/numbness in his left side, that he states he typically gets with his headaches. He also complains of nausea but denies any vomiting or changes in speech. He has tried OTC pain relieves with no relief. The patient reports normal appetite and fluid intake. His blood sugar levels have been between 174-214. He has a history of type II DM and HTN. The patient has not followed up with a neurologist for his headaches.   Past Medical History  Diagnosis Date  . Hypertension   . GERD (gastroesophageal reflux disease)   . Pneumonia   . Diabetes mellitus     History reviewed. No pertinent past surgical history.  Family History  Problem Relation Age of Onset  . Diabetes Mother   . Hypertension Mother     History  Substance Use Topics  . Smoking status: Former Games developer  . Smokeless tobacco: Not on file  . Alcohol Use: No      Review of Systems 10 Systems reviewed and all are negative for acute change except as noted in the HPI.    Allergies  Review of patient's allergies indicates  no known allergies.  Home Medications   Current Outpatient Rx  Name  Route  Sig  Dispense  Refill  . ACETAMINOPHEN 500 MG PO TABS   Oral   Take 500-1,000 mg by mouth every 6 (six) hours as needed. For pain         . OMEGA-3 FATTY ACIDS 1000 MG PO CAPS   Oral   Take 1 g by mouth daily.         [name] HYDROCODONE-ACETAMINOPHEN 5-325 MG PO TABS   Oral   Take 1 tablet by mouth every 6 (six) hours as needed. Take one-two tabs po q 4-6 hrs prn pain         . IBUPROFEN 800 MG PO TABS   Oral   Take 800 mg by mouth every 8 (eight) hours as needed. For pain          . INSULIN ASPART 100 UNIT/ML [place] SOLN   Subcutaneous   Inject 10 Units into the skin 3 (three) times daily. Take 10 units three times daily         . INSULIN DETEMIR 100 UNIT/ML [place] SOLN   Subcutaneous   Inject 30 Units into the skin at bedtime.          [name] LISINOPRIL 10 MG PO TABS   Oral   Take 10 mg by mouth daily.           [name]  ASPIRIN EC 325 MG PO TBEC   Oral   Take 1 tablet (325 mg total) by mouth daily.   30 tablet   0   . METOCLOPRAMIDE HCL 10 MG PO TABS   Oral   Take 1 tablet (10 mg total) by mouth every 6 (six) hours as needed (nausea/headache).   6 tablet   0   . OXYCODONE-ACETAMINOPHEN 5-325 MG PO TABS   Oral   Take 2 tablets by mouth every 4 (four) hours as needed for pain.   10 tablet   0     Triage Vitals: BP 155/81  Pulse 71  Temp 97.7 F (36.5 C)  Resp 20  Ht 5\' 11"  (1.803 m)  Wt 195 lb (88.451 kg)  BMI 27.20 kg/m2  SpO2 99%  Physical Exam  Nursing note and vitals reviewed. Constitutional:       Awake, alert, nontoxic appearance with baseline speech for patient.  HENT:  Head: Atraumatic.  Mouth/Throat: No oropharyngeal exudate.  Eyes: EOM are normal. Pupils are equal, round, and reactive to light. Right eye exhibits no discharge. Left eye exhibits no discharge.  Neck: Neck supple.  Cardiovascular: Normal rate, regular rhythm and normal heart sounds.   No murmur  heard. Pulmonary/Chest: Effort normal and breath sounds normal. No stridor. No respiratory distress. He has no wheezes. He has no rales. He exhibits no tenderness.  Abdominal: Soft. Bowel sounds are normal. He exhibits no mass. There is tenderness. There is no rebound.       Mild diffuse tenderness in abdomen, which is chronic and typical per patient.  Musculoskeletal: He exhibits no edema and no tenderness.       Baseline ROM, moves extremities with no obvious new focal weakness.  Lymphadenopathy:    He has no cervical adenopathy.  Neurological:       Awake, alert, cooperative and aware of situation; motor strength bilaterally symmetric arms, but weak 4/5 left leg; sensation normal to light touch bilaterally; peripheral visual fields full to confrontation; no facial asymmetry; tongue midline; major cranial nerves appear intact; no pronator drift in arms, positive pronator drift left leg, normal finger to nose bilaterally,gait with limp due to left leg weakness without new ataxia.  Skin: No rash noted.  Psychiatric: He has a normal mood and affect.    ED Course  Procedures (including critical care time) ECG: Normal sinus rhythm, ventricular rate 73, normal axis, normal intervals, no acute ischemic changes noted, impression normal ECG, no comparison ECG available DIAGNOSTIC STUDIES: Oxygen Saturation is 99% on room air, normal by my interpretation.    COORDINATION OF CARE: 5: 15 PM: Patient / Family / Caregiver understand and agree with initial ED impression and plan with expectations set for ED visit.  7:00 PM; Pt feels improved after observation and/or treatment in ED, but is still having nausea and some pain. Pt will have an MRI  8:55 PM; Pt feels improved after observation and/or treatment in ED.   Results for orders placed during the hospital encounter of 11/19/12  PROTIME-INR      Component Value Range   Prothrombin Time 13.6  11.6 - 15.2 seconds   INR 1.05  0.00 - 1.49  APTT       Component Value Range   aPTT 27  24 - 37 seconds  CBC      Component Value Range   WBC 4.5  4.0 - 10.5 K/uL   RBC 4.16 (*) 4.22 - 5.81 MIL/uL   Hemoglobin 12.5 (*)  13.0 - 17.0 g/dL   HCT 16.1 (*) 09.6 - 04.5 %   MCV 87.5  78.0 - 100.0 fL   MCH 30.0  26.0 - 34.0 pg   MCHC 34.3  30.0 - 36.0 g/dL   RDW 40.9  81.1 - 91.4 %   Platelets 206  150 - 400 K/uL  DIFFERENTIAL      Component Value Range   Neutrophils Relative 48  43 - 77 %   Neutro Abs 2.1  1.7 - 7.7 K/uL   Lymphocytes Relative 35  12 - 46 %   Lymphs Abs 1.6  0.7 - 4.0 K/uL   Monocytes Relative 15 (*) 3 - 12 %   Monocytes Absolute 0.7  0.1 - 1.0 K/uL   Eosinophils Relative 2  0 - 5 %   Eosinophils Absolute 0.1  0.0 - 0.7 K/uL   Basophils Relative 0  0 - 1 %   Basophils Absolute 0.0  0.0 - 0.1 K/uL  COMPREHENSIVE METABOLIC PANEL      Component Value Range   Sodium 140  135 - 145 mEq/L   Potassium 3.4 (*) 3.5 - 5.1 mEq/L   Chloride 103  96 - 112 mEq/L   CO2 26  19 - 32 mEq/L   Glucose, Bld 184 (*) 70 - 99 mg/dL   BUN 7  6 - 23 mg/dL   Creatinine, Ser 7.82  0.50 - 1.35 mg/dL   Calcium 9.7  8.4 - 95.6 mg/dL   Total Protein 7.1  6.0 - 8.3 g/dL   Albumin 4.0  3.5 - 5.2 g/dL   AST 76 (*) 0 - 37 U/L   ALT 72 (*) 0 - 53 U/L   Alkaline Phosphatase 59  39 - 117 U/L   Total Bilirubin 0.6  0.3 - 1.2 mg/dL   GFR calc non Af Amer >90  >90 mL/min   GFR calc Af Amer >90  >90 mL/min  TROPONIN I      Component Value Range   Troponin I <0.30  <0.30 ng/mL  URINE RAPID DRUG SCREEN (HOSP PERFORMED)      Component Value Range   Opiates POSITIVE (*) NONE DETECTED   Cocaine NONE DETECTED  NONE DETECTED   Benzodiazepines NONE DETECTED  NONE DETECTED   Amphetamines NONE DETECTED  NONE DETECTED   Tetrahydrocannabinol NONE DETECTED  NONE DETECTED   Barbiturates NONE DETECTED  NONE DETECTED  ETHANOL      Component Value Range   Alcohol, Ethyl (B) <11  0 - 11 mg/dL  GLUCOSE, CAPILLARY      Component Value Range   Glucose-Capillary  164 (*) 70 - 99 mg/dL   No results found.   1. Headache   2. Left-sided weakness   3. Complicated migraine   4. Elevated liver enzymes   5. Hyperglycemia   6. Diabetes mellitus       MDM  I personally performed the services described in this documentation, which was scribed in my presence. The recorded information has been reviewed and is accurate.  Pt stable in ED with no significant deterioration in condition.  Patient / Family / Caregiver informed of clinical course, understand medical decision-making process, and agree with plan.  I doubt any other EMC precluding discharge at this time including, but not necessarily limited to the following:SAH, CVA, SBI.        Hurman Horn, MD 11/22/12 [phone]

## 2013-02-24 ENCOUNTER — Emergency Department (HOSPITAL_COMMUNITY)
Admission: EM | Admit: 2013-02-24 | Discharge: 2013-02-25 | Disposition: A | Discharge status: Home / Self Care | Payer: Self-pay | Attending: Emergency Medicine | Admitting: Emergency Medicine

## 2013-02-24 ENCOUNTER — Emergency Department (HOSPITAL_COMMUNITY): Payer: Self-pay

## 2013-02-24 ENCOUNTER — Encounter (HOSPITAL_COMMUNITY): Payer: Self-pay

## 2013-02-24 DIAGNOSIS — R071 Chest pain on breathing: Secondary | ICD-10-CM | POA: Insufficient documentation

## 2013-02-24 DIAGNOSIS — Z794 Long term (current) use of insulin: Secondary | ICD-10-CM | POA: Insufficient documentation

## 2013-02-24 DIAGNOSIS — J3489 Other specified disorders of nose and nasal sinuses: Secondary | ICD-10-CM | POA: Insufficient documentation

## 2013-02-24 DIAGNOSIS — Z87891 Personal history of nicotine dependence: Secondary | ICD-10-CM | POA: Insufficient documentation

## 2013-02-24 DIAGNOSIS — R1013 Epigastric pain: Secondary | ICD-10-CM | POA: Insufficient documentation

## 2013-02-24 DIAGNOSIS — Z8701 Personal history of pneumonia (recurrent): Secondary | ICD-10-CM | POA: Insufficient documentation

## 2013-02-24 DIAGNOSIS — E1169 Type 2 diabetes mellitus with other specified complication: Secondary | ICD-10-CM | POA: Insufficient documentation

## 2013-02-24 DIAGNOSIS — I10 Essential (primary) hypertension: Secondary | ICD-10-CM | POA: Insufficient documentation

## 2013-02-24 DIAGNOSIS — Z79899 Other long term (current) drug therapy: Secondary | ICD-10-CM | POA: Insufficient documentation

## 2013-02-24 DIAGNOSIS — R509 Fever, unspecified: Secondary | ICD-10-CM | POA: Insufficient documentation

## 2013-02-24 DIAGNOSIS — R350 Frequency of micturition: Secondary | ICD-10-CM | POA: Insufficient documentation

## 2013-02-24 DIAGNOSIS — N289 Disorder of kidney and ureter, unspecified: Secondary | ICD-10-CM | POA: Insufficient documentation

## 2013-02-24 DIAGNOSIS — R110 Nausea: Secondary | ICD-10-CM | POA: Insufficient documentation

## 2013-02-24 DIAGNOSIS — K219 Gastro-esophageal reflux disease without esophagitis: Secondary | ICD-10-CM | POA: Insufficient documentation

## 2013-02-24 DIAGNOSIS — R0602 Shortness of breath: Secondary | ICD-10-CM | POA: Insufficient documentation

## 2013-02-24 DIAGNOSIS — R059 Cough, unspecified: Secondary | ICD-10-CM | POA: Insufficient documentation

## 2013-02-24 LAB — COMPREHENSIVE METABOLIC PANEL
BUN: 23 mg/dL (ref 6–23)
Calcium: 9.7 mg/dL (ref 8.4–10.5)
GFR calc Af Amer: 53 mL/min — ABNORMAL LOW (ref >90)
Glucose, Bld: 336 mg/dL — ABNORMAL HIGH (ref 70–99)
Sodium: 129 mEq/L — ABNORMAL LOW (ref 135–145)
Total Protein: 7.7 g/dL (ref 6.0–8.3)

## 2013-02-24 LAB — CBC WITH DIFFERENTIAL/PLATELET
Basophils Relative: 0 % (ref 0–1)
Eosinophils Absolute: 0.2 10*3/uL (ref 0.0–0.7)
Eosinophils Relative: 2 % (ref 0–5)
Lymphs Abs: 3.2 10*3/uL (ref 0.7–4.0)
MCH: 30.5 pg (ref 26.0–34.0)
MCHC: 35.6 g/dL (ref 30.0–36.0)
MCV: 85.5 fL (ref 78.0–100.0)
Monocytes Relative: 11 % (ref 3–12)
Platelets: 253 10*3/uL (ref 150–400)
RBC: 4.4 MIL/uL (ref 4.22–5.81)

## 2013-02-24 LAB — LIPASE, BLOOD: Lipase: 27 U/L (ref 11–59)

## 2013-02-24 LAB — D-DIMER, QUANTITATIVE: D-Dimer, Quant: 0.36 ug/mL-FEU (ref 0.00–0.48)

## 2013-02-24 MED ORDER — HYDROMORPHONE HCL PF 1 MG/ML IJ SOLN
1.0000 mg | Freq: Once | INTRAMUSCULAR | Status: AC
  Administered 2013-02-24: 1 mg via INTRAVENOUS
  Filled 2013-02-24: qty 1

## 2013-02-24 MED ORDER — SODIUM CHLORIDE 0.9 % IV SOLN
INTRAVENOUS | Status: DC
  Administered 2013-02-25: via INTRAVENOUS

## 2013-02-24 MED ORDER — SODIUM CHLORIDE 0.9 % IV BOLUS (SEPSIS)
500.0000 mL | Freq: Once | INTRAVENOUS | Status: AC
  Administered 2013-02-24: 500 mL via INTRAVENOUS

## 2013-02-24 MED ORDER — ONDANSETRON HCL 4 MG/2ML IJ SOLN
4.0000 mg | Freq: Once | INTRAMUSCULAR | Status: AC
  Administered 2013-02-24: 4 mg via INTRAVENOUS
  Filled 2013-02-24: qty 2

## 2013-02-24 NOTE — ED Notes (Signed)
Pt states he feels like he has a knot in his left and right collar bone, also states pain with a cough and with palpation of the areas. Also states dizziness and states "I dont know how to explain everything" pt is aax4 at this time, NAD noted, EKG ordered and in process.

## 2013-02-24 NOTE — ED Provider Notes (Addendum)
History  This chart was scribed for [name], MD by Bennett Scrape, ED Scribe. This patient was seen in room APA12/APA12 and the patient's care was started at 10:34 PM.  CSN: [id number]  Arrival date & time 02/24/13  2120   First MD Initiated Contact with Patient 02/24/13 2234      Chief Complaint  Patient presents with  . Pain in collarbone      Patient is a 52 y.o. male presenting with chest pain. The history is provided by the patient. No language interpreter was used.  Chest Pain Pain location:  R chest Pain quality: aching   Pain radiates to:  Epigastrium Pain radiates to the back: no   Onset quality:  Gradual Associated symptoms: abdominal pain, fever, nausea and shortness of breath   Associated symptoms: no back pain, no cough, no headache, not vomiting and no weakness     Robert Lyons is a 52 y.o. male who presents to the Emergency Department complaining of 2 weeks of intermittent CP described as soreness that radiates into his epigastric area became constant over the past 3 to 4 days ago and worse today with associated SOB, congestion and cough. He reports that swallowing food and coughing worsens the pain and pt states that he feels like the "food gets hung up". He states that he has been seen for the same diagnosed as chest wall pain. He has a h/o GERD but denies similarities. He denies having a h/o asthma but states that he was put on an inhaler for his SOB with no improvement. He also c/o a gradually worsening "knot" along the right collarbone and right sternum that are painful with touch and movement. He reports frequency, subject fever and nausea but denies diarrhea, dysuria and HA as associated symptoms. He also has a h/o HTN and DM and is a former smoker but denies alcohol use.  Pt sees the health department  Past Medical History  Diagnosis Date  . Hypertension   . GERD (gastroesophageal reflux disease)   . Pneumonia   . Diabetes mellitus      History reviewed. No pertinent past surgical history.  Family History  Problem Relation Age of Onset  . Diabetes Mother   . Hypertension Mother     History  Substance Use Topics  . Smoking status: Former Games developer  . Smokeless tobacco: Not on file  . Alcohol Use: No      Review of Systems  Constitutional: Positive for fever. Negative for chills.  HENT: Positive for congestion. Negative for sore throat, rhinorrhea and neck pain.   Eyes: Negative for visual disturbance.  Respiratory: Positive for shortness of breath. Negative for cough.   Cardiovascular: Positive for chest pain.  Gastrointestinal: Positive for nausea and abdominal pain. Negative for vomiting and diarrhea.  Genitourinary: Positive for frequency. Negative for dysuria and hematuria.  Musculoskeletal: Negative for back pain.  Skin: Negative for rash.  Neurological: Negative for weakness and headaches.  Hematological: Does not bruise/bleed easily.  All other systems reviewed and are negative.    Allergies  Review of patient's allergies indicates no known allergies.  Home Medications   Current Outpatient Rx  Name  Route  Sig  Dispense  Refill  . aspirin EC 325 MG tablet   Oral   Take 1 tablet (325 mg total) by mouth daily.   30 tablet   0   . insulin detemir (LEVEMIR) 100 UNIT/ML injection   Subcutaneous   Inject 30 Units into the  skin at bedtime.          [name] lisinopril (PRINIVIL,ZESTRIL) 10 MG tablet   Oral   Take 10 mg by mouth daily.           . metFORMIN (GLUCOPHAGE) 1000 MG tablet   Oral   Take 1,000 mg by mouth 2 (two) times daily with a meal.         . metoCLOPramide (REGLAN) 10 MG tablet   Oral   Take 1 tablet (10 mg total) by mouth every 6 (six) hours as needed (nausea/headache).   6 tablet   0   . acetaminophen (TYLENOL) 500 MG tablet   Oral   Take 500-1,000 mg by mouth every 6 (six) hours as needed. For pain         . fish oil-omega-3 fatty acids 1000 MG capsule    Oral   Take 1 g by mouth daily.         [name] HYDROcodone-acetaminophen (NORCO/VICODIN) 5-325 MG per tablet   Oral   Take 1 tablet by mouth every 6 (six) hours as needed. Take one-two tabs po q 4-6 hrs prn pain         . insulin aspart (NOVOLOG) 100 UNIT/ML injection   Subcutaneous   Inject 10 Units into the skin 3 (three) times daily. Take 10 units three times daily         . naproxen (NAPROSYN) 500 MG tablet   Oral   Take 1 tablet (500 mg total) by mouth 2 (two) times daily.   14 tablet   0   . oxyCODONE-acetaminophen (PERCOCET) 5-325 MG per tablet   Oral   Take 2 tablets by mouth every 4 (four) hours as needed for pain.   10 tablet   0   . traMADol (ULTRAM) 50 MG tablet   Oral   Take 1 tablet (50 mg total) by mouth every 6 (six) hours as needed for pain.   15 tablet   0     Triage Vitals: BP 114/71  Pulse 111  Temp(Src) 98.9 F (37.2 C) (Oral)  SpO2 98%  Physical Exam  Nursing note and vitals reviewed. Constitutional: He is oriented to person, place, and time. He appears well-developed and well-nourished. No distress.  HENT:  Head: Normocephalic and atraumatic.  Mouth/Throat: Oropharynx is clear and moist.  Eyes: Conjunctivae and EOM are normal. Pupils are equal, round, and reactive to light.  Neck: Normal range of motion. Neck supple. No tracheal deviation present.  Cardiovascular: Normal rate and regular rhythm.   No murmur heard. Pulmonary/Chest: Effort normal and breath sounds normal. No respiratory distress. He has no wheezes. He has no rales. He exhibits tenderness (prominent clavical head on the right that is tender to palpation).  Abdominal: Soft. Bowel sounds are normal. There is no tenderness.  Musculoskeletal: Normal range of motion. He exhibits no edema (no pedal edema).  Neurological: He is alert and oriented to person, place, and time. No cranial nerve deficit.  Pt able to move both sets of fingers and toes  Skin: Skin is warm and dry.   Psychiatric: He has a normal mood and affect. His behavior is normal.    ED Course  Procedures (including critical care time)  DIAGNOSTIC STUDIES: Oxygen Saturation is 98% on room air, normal by my interpretation.    COORDINATION OF CARE: 10:51 PM-Discussed treatment plan which includes CXR, CBC panel, UA, d-dimer and medications with pt at bedside and pt agreed to plan.   11:15  PM- Ordered 500 mL of bolus, 4 mg Zofran injection and 1 mg Dilaudid injection  Labs Reviewed  CBC WITH DIFFERENTIAL - Abnormal; Notable for the following:    HCT 37.6 (*)    Monocytes Absolute 1.1 (*)    All other components within normal limits  COMPREHENSIVE METABOLIC PANEL - Abnormal; Notable for the following:    Sodium 129 (*)    Chloride 89 (*)    Glucose, Bld 336 (*)    Creatinine, Ser 1.65 (*)    AST 131 (*)    ALT 134 (*)    GFR calc non Af Amer 46 (*)    GFR calc Af Amer 53 (*)    All other components within normal limits  URINALYSIS, ROUTINE W REFLEX MICROSCOPIC - Abnormal; Notable for the following:    Glucose, UA >1000 (*)    All other components within normal limits  TROPONIN I  LIPASE, BLOOD  D-DIMER, QUANTITATIVE  URINE MICROSCOPIC-ADD ON   Dg Ribs Unilateral W/chest Right  02/24/2013  *RADIOLOGY REPORT*  Clinical Data: Right-sided rib, chest and clavicular pain.  Cough.  RIGHT RIBS AND CHEST - 3+ VIEW  Comparison: Chest radiograph performed 10/17/2012  Findings: No displaced rib fractures are seen.  The right clavicle appears intact.  The lungs are well-aerated and clear.  There is no evidence of focal opacification, pleural effusion or pneumothorax.  The cardiomediastinal silhouette is borderline normal in size.  No acute osseous abnormalities are seen.  IMPRESSION: No displaced rib fractures seen; no acute cardiopulmonary process identified.   Original Report Authenticated By: Tonia Ghent, M.D.    Results for orders placed during the hospital encounter of 02/24/13  TROPONIN I       Result Value Range   Troponin I <0.30  <0.30 ng/mL  CBC WITH DIFFERENTIAL      Result Value Range   WBC 9.6  4.0 - 10.5 K/uL   RBC 4.40  4.22 - 5.81 MIL/uL   Hemoglobin 13.4  13.0 - 17.0 g/dL   HCT 16.1 (*) 09.6 - 04.5 %   MCV 85.5  78.0 - 100.0 fL   MCH 30.5  26.0 - 34.0 pg   MCHC 35.6  30.0 - 36.0 g/dL   RDW 40.9  81.1 - 91.4 %   Platelets 253  150 - 400 K/uL   Neutrophils Relative 54  43 - 77 %   Neutro Abs 5.2  1.7 - 7.7 K/uL   Lymphocytes Relative 33  12 - 46 %   Lymphs Abs 3.2  0.7 - 4.0 K/uL   Monocytes Relative 11  3 - 12 %   Monocytes Absolute 1.1 (*) 0.1 - 1.0 K/uL   Eosinophils Relative 2  0 - 5 %   Eosinophils Absolute 0.2  0.0 - 0.7 K/uL   Basophils Relative 0  0 - 1 %   Basophils Absolute 0.0  0.0 - 0.1 K/uL  COMPREHENSIVE METABOLIC PANEL      Result Value Range   Sodium 129 (*) 135 - 145 mEq/L   Potassium 3.6  3.5 - 5.1 mEq/L   Chloride 89 (*) 96 - 112 mEq/L   CO2 23  19 - 32 mEq/L   Glucose, Bld 336 (*) 70 - 99 mg/dL   BUN 23  6 - 23 mg/dL   Creatinine, Ser 7.82 (*) 0.50 - 1.35 mg/dL   Calcium 9.7  8.4 - 95.6 mg/dL   Total Protein 7.7  6.0 - 8.3 g/dL   Albumin 3.9  3.5 - 5.2 g/dL   AST 960 (*) 0 - 37 U/L   ALT 134 (*) 0 - 53 U/L   Alkaline Phosphatase 90  39 - 117 U/L   Total Bilirubin 0.5  0.3 - 1.2 mg/dL   GFR calc non Af Amer 46 (*) >90 mL/min   GFR calc Af Amer 53 (*) >90 mL/min  LIPASE, BLOOD      Result Value Range   Lipase 27  11 - 59 U/L  URINALYSIS, ROUTINE W REFLEX MICROSCOPIC      Result Value Range   Color, Urine YELLOW  YELLOW   APPearance CLEAR  CLEAR   Specific Gravity, Urine 1.010  1.005 - 1.030   pH 6.0  5.0 - 8.0   Glucose, UA >1000 (*) NEGATIVE mg/dL   Hgb urine dipstick NEGATIVE  NEGATIVE   Bilirubin Urine NEGATIVE  NEGATIVE   Ketones, ur NEGATIVE  NEGATIVE mg/dL   Protein, ur NEGATIVE  NEGATIVE mg/dL   Urobilinogen, UA 0.2  0.0 - 1.0 mg/dL   Nitrite NEGATIVE  NEGATIVE   Leukocytes, UA NEGATIVE  NEGATIVE  D-DIMER,  QUANTITATIVE      Result Value Range   D-Dimer, Quant 0.36  0.00 - 0.48 ug/mL-FEU  URINE MICROSCOPIC-ADD ON      Result Value Range   Squamous Epithelial / LPF RARE  RARE   WBC, UA 0-2  <3 WBC/hpf   RBC / HPF 0-2  <3 RBC/hpf   Bacteria, UA RARE  RARE    Date: 02/25/2013  Rate: 102  Rhythm: sinus tachycardia  QRS Axis: normal  Intervals: normal  ST/T Wave abnormalities: normal  Conduction Disutrbances:none  Narrative Interpretation:   Old EKG Reviewed: unchanged No sniffing change in EKG compared to 11/19/2012    1. Chest wall pain   2. Renal insufficiency   3. Hyperglycemia       MDM   Symptoms similar to prior visits as this time as it has in the past seems to be consistent with chest wall pain. Chest x-ray negative d-dimer negative troponin negative EKG with sinus tachycardia around 102 otherwise no significant amount in no change in morphology compared to 11/19/2012. As stated suspect chest wall pain in nature patient will be discharged home with anti-inflammatory medicine and pain medicine. Patient has health department for followup will need to have blood sugar rechecked which was in the 300s tonight and also kidney creatinine was elevated at 1.6 to need to be rechecked in 1-2 weeks.  As stated patient's prior visits for this chest pain were reviewed.      I personally performed the services described in this documentation, which was scribed in my presence. The recorded information has been reviewed and is accurate.     [name], MD 02/25/13 4540  [name], MD 02/25/13 [phone]

## 2013-02-24 NOTE — ED Notes (Signed)
Pt states for over a month he has been feeling some knots on his collar bone and pain with palpation.

## 2013-02-25 LAB — URINALYSIS, ROUTINE W REFLEX MICROSCOPIC
Leukocytes, UA: NEGATIVE
Nitrite: NEGATIVE
Specific Gravity, Urine: 1.01 (ref 1.005–1.030)
Urobilinogen, UA: 0.2 mg/dL (ref 0.0–1.0)

## 2013-02-25 LAB — URINE MICROSCOPIC-ADD ON

## 2013-02-25 MED ORDER — ALBUTEROL SULFATE (5 MG/ML) 0.5% IN NEBU
5.0000 mg | INHALATION_SOLUTION | Freq: Once | RESPIRATORY_TRACT | Status: AC
  Administered 2013-02-25: 5 mg via RESPIRATORY_TRACT
  Filled 2013-02-25: qty 1

## 2013-02-25 MED ORDER — ALBUTEROL SULFATE HFA 108 (90 BASE) MCG/ACT IN AERS: 2.0000 | INHALATION_SPRAY | Freq: Four times a day (QID) | RESPIRATORY_TRACT | Status: DC | PRN

## 2013-02-25 MED ORDER — NAPROXEN 500 MG PO TABS: 500.0000 mg | ORAL_TABLET | Freq: Two times a day (BID) | ORAL | Status: DC

## 2013-02-25 MED ORDER — TRAMADOL HCL 50 MG PO TABS: 50.0000 mg | ORAL_TABLET | Freq: Four times a day (QID) | ORAL | Status: DC | PRN

## 2013-02-25 MED ORDER — ALBUTEROL SULFATE HFA 108 (90 BASE) MCG/ACT IN AERS
INHALATION_SPRAY | RESPIRATORY_TRACT | Status: AC
  Administered 2013-02-25: 01:00:00
  Filled 2013-02-25: qty 6.7

## 2013-02-25 MED ORDER — HYDROMORPHONE HCL PF 1 MG/ML IJ SOLN
1.0000 mg | Freq: Once | INTRAMUSCULAR | Status: AC
  Administered 2013-02-25: 1 mg via INTRAVENOUS
  Filled 2013-02-25: qty 1

## 2013-02-25 NOTE — ED Notes (Signed)
Pt states his pain is back and also states he feels like he needs a breathing treatment, lung sounds clear currently. Dr Deretha Emory notified and orders placed

## 2013-03-05 ENCOUNTER — Emergency Department (HOSPITAL_COMMUNITY)
Admission: EM | Admit: 2013-03-05 | Discharge: 2013-03-05 | Disposition: A | Discharge status: Home / Self Care | Payer: Self-pay | Attending: Emergency Medicine | Admitting: Emergency Medicine

## 2013-03-05 ENCOUNTER — Encounter (HOSPITAL_COMMUNITY): Payer: Self-pay | Admitting: Emergency Medicine

## 2013-03-05 DIAGNOSIS — R110 Nausea: Secondary | ICD-10-CM | POA: Insufficient documentation

## 2013-03-05 DIAGNOSIS — Z79899 Other long term (current) drug therapy: Secondary | ICD-10-CM | POA: Insufficient documentation

## 2013-03-05 DIAGNOSIS — Z7982 Long term (current) use of aspirin: Secondary | ICD-10-CM | POA: Insufficient documentation

## 2013-03-05 DIAGNOSIS — E119 Type 2 diabetes mellitus without complications: Secondary | ICD-10-CM | POA: Insufficient documentation

## 2013-03-05 DIAGNOSIS — H538 Other visual disturbances: Secondary | ICD-10-CM | POA: Insufficient documentation

## 2013-03-05 DIAGNOSIS — R51 Headache: Secondary | ICD-10-CM | POA: Insufficient documentation

## 2013-03-05 DIAGNOSIS — I10 Essential (primary) hypertension: Secondary | ICD-10-CM | POA: Insufficient documentation

## 2013-03-05 DIAGNOSIS — Z87891 Personal history of nicotine dependence: Secondary | ICD-10-CM | POA: Insufficient documentation

## 2013-03-05 DIAGNOSIS — Z8701 Personal history of pneumonia (recurrent): Secondary | ICD-10-CM | POA: Insufficient documentation

## 2013-03-05 DIAGNOSIS — Z8719 Personal history of other diseases of the digestive system: Secondary | ICD-10-CM | POA: Insufficient documentation

## 2013-03-05 MED ORDER — METOCLOPRAMIDE HCL 10 MG PO TABS: 10.0000 mg | ORAL_TABLET | Freq: Four times a day (QID) | ORAL | Status: DC | PRN

## 2013-03-05 MED ORDER — DIPHENHYDRAMINE HCL 50 MG/ML IJ SOLN
25.0000 mg | Freq: Once | INTRAMUSCULAR | Status: AC
  Administered 2013-03-05: 50 mg via INTRAVENOUS
  Filled 2013-03-05: qty 1

## 2013-03-05 MED ORDER — METOCLOPRAMIDE HCL 5 MG/ML IJ SOLN
10.0000 mg | Freq: Once | INTRAMUSCULAR | Status: AC
  Administered 2013-03-05: 10 mg via INTRAVENOUS
  Filled 2013-03-05: qty 2

## 2013-03-05 MED ORDER — HYDROMORPHONE HCL PF 1 MG/ML IJ SOLN
1.0000 mg | Freq: Once | INTRAMUSCULAR | Status: AC
  Administered 2013-03-05: 1 mg via INTRAVENOUS
  Filled 2013-03-05: qty 1

## 2013-03-05 MED ORDER — SODIUM CHLORIDE 0.9 % IV SOLN
1000.0000 mL | INTRAVENOUS | Status: DC
  Administered 2013-03-05: 1000 mL via INTRAVENOUS

## 2013-03-05 MED ORDER — SODIUM CHLORIDE 0.9 % IV SOLN
1000.0000 mL | Freq: Once | INTRAVENOUS | Status: AC
  Administered 2013-03-05: 1000 mL via INTRAVENOUS

## 2013-03-05 NOTE — ED Notes (Signed)
Transferred to room 9 per order J Idol PA. With elevated pulse and cbg.

## 2013-03-05 NOTE — ED Notes (Signed)
Pt c/o right side ha x 3 days.

## 2013-03-05 NOTE — ED Provider Notes (Signed)
History    This chart was scribed for Robert Booze, MD by Gerlean Ren, ED Scribe. This patient was seen in room APA09/APA09 and the patient's care was started at 6:14 PM    CSN: [id number]  Arrival date & time 03/05/13  1712   First MD Initiated Contact with Patient 03/05/13 1737      Chief Complaint  Patient presents with  . Headache     The history is provided by the patient. No language interpreter was used.  ARSH FEUTZ is a 52 y.o. male with h/o HTN and DM who presents to the Emergency Department complaining of 3 days of a stabbing HA localized to the right side of his head and extends into right face around the eye that waxes-and-wanes with no obvious triggers.  HA not worsened by bright lights or loud noises.  Pt has used Tramadol that he received when he was here last with minimal relief to pain.  Pt reports blurry vision in right eye.  Pt reports some nausea this morning but denies emesis.  Not worsened by lights or loud noises.  Pt has been previously diagnosed with migraines here several months ago. No PCP.  Past Medical History  Diagnosis Date  . Hypertension   . GERD (gastroesophageal reflux disease)   . Pneumonia   . Diabetes mellitus     History reviewed. No pertinent past surgical history.  Family History  Problem Relation Age of Onset  . Diabetes Mother   . Hypertension Mother     History  Substance Use Topics  . Smoking status: Former Games developer  . Smokeless tobacco: Not on file  . Alcohol Use: No      Review of Systems  Eyes: Negative for photophobia.       Right eye blurry vision  Gastrointestinal: Positive for nausea. Negative for vomiting.  Neurological: Positive for headaches.  All other systems reviewed and are negative.    Allergies  Review of patient's allergies indicates no known allergies.  Home Medications   Current Outpatient Rx  Name  Route  Sig  Dispense  Refill  . acetaminophen (TYLENOL) 500 MG tablet   Oral   Take  500-1,000 mg by mouth every 6 (six) hours as needed. For pain         . aspirin EC 325 MG tablet   Oral   Take 1 tablet (325 mg total) by mouth daily.   30 tablet   0   . HYDROcodone-acetaminophen (NORCO/VICODIN) 5-325 MG per tablet   Oral   Take 1 tablet by mouth every 6 (six) hours as needed. Take one-two tabs po q 4-6 hrs prn pain         . lisinopril (PRINIVIL,ZESTRIL) 10 MG tablet   Oral   Take 10 mg by mouth daily.           . metFORMIN (GLUCOPHAGE) 1000 MG tablet   Oral   Take 1,000 mg by mouth 2 (two) times daily with a meal.         . metoCLOPramide (REGLAN) 10 MG tablet   Oral   Take 1 tablet (10 mg total) by mouth every 6 (six) hours as needed (nausea/headache).   6 tablet   0   . naproxen (NAPROSYN) 500 MG tablet   Oral   Take 1 tablet (500 mg total) by mouth 2 (two) times daily.   14 tablet   0   . traMADol (ULTRAM) 50 MG tablet   Oral  Take 1 tablet (50 mg total) by mouth every 6 (six) hours as needed for pain.   15 tablet   0     BP 138/105  Pulse 125  Temp(Src) 98.8 F (37.1 C)  Resp 18  Ht 5\' 11"  (1.803 m)  Wt 205 lb (92.987 kg)  BMI 28.6 kg/m2  SpO2 98%  Physical Exam  Nursing note and vitals reviewed. Constitutional: He is oriented to person, place, and time. He appears well-developed and well-nourished. No distress.  Appears uncomfortable   HENT:  Head: Normocephalic and atraumatic.  Tender right parietal area, no rash or swelling, fundi normal  Eyes: EOM are normal.  Neck: Neck supple. No tracheal deviation present.  Cardiovascular: Normal rate, regular rhythm and normal heart sounds.   No murmur heard. Pulmonary/Chest: Effort normal and breath sounds normal. No respiratory distress. He has no rales.  Musculoskeletal: Normal range of motion.  Neurological: He is alert and oriented to person, place, and time.  Skin: Skin is warm and dry.  Psychiatric: He has a normal mood and affect. His behavior is normal.    ED Course   Procedures (including critical care time) DIAGNOSTIC STUDIES: Oxygen Saturation is 98% on room air, normal by my interpretation.    COORDINATION OF CARE: 6:21 PM- Patient informed of clinical course including pain and nausea relief.  Pt understands medical decision-making process and agrees with plan.  Results for orders placed during the hospital encounter of 03/05/13  GLUCOSE, CAPILLARY      Result Value Range   Glucose-Capillary 399 (*) 70 - 99 mg/dL     1. Headache       MDM  Headache in a patient with history of migraine headaches. Although the headache is not typical for migraines, he will be given a trial of a headache cocktail. He does not appear toxic and review of old records shows that he had extensive workup in November including MRI scan which was unremarkable.  He got partial relief of his headache with the headache cocktail and then completely relieved with a dose of hydromorphone. This was similar to the amount of medication he needed with his previous visit for headache. He is discharged with prescription for metoclopramide.  I personally performed the services described in this documentation, which was scribed in my presence. The recorded information has been reviewed and is accurate.           Robert Booze, MD 03/05/13 2211

## 2013-03-10 ENCOUNTER — Emergency Department (HOSPITAL_COMMUNITY): Payer: Self-pay

## 2013-03-10 ENCOUNTER — Encounter (HOSPITAL_COMMUNITY): Payer: Self-pay

## 2013-03-10 ENCOUNTER — Emergency Department (HOSPITAL_COMMUNITY)
Admission: EM | Admit: 2013-03-10 | Discharge: 2013-03-10 | Disposition: A | Discharge status: Home / Self Care | Payer: Self-pay | Attending: Emergency Medicine | Admitting: Emergency Medicine

## 2013-03-10 DIAGNOSIS — H538 Other visual disturbances: Secondary | ICD-10-CM | POA: Insufficient documentation

## 2013-03-10 DIAGNOSIS — R0982 Postnasal drip: Secondary | ICD-10-CM | POA: Insufficient documentation

## 2013-03-10 DIAGNOSIS — Z87891 Personal history of nicotine dependence: Secondary | ICD-10-CM | POA: Insufficient documentation

## 2013-03-10 DIAGNOSIS — I10 Essential (primary) hypertension: Secondary | ICD-10-CM | POA: Insufficient documentation

## 2013-03-10 DIAGNOSIS — J019 Acute sinusitis, unspecified: Secondary | ICD-10-CM | POA: Insufficient documentation

## 2013-03-10 DIAGNOSIS — R51 Headache: Secondary | ICD-10-CM | POA: Insufficient documentation

## 2013-03-10 DIAGNOSIS — E119 Type 2 diabetes mellitus without complications: Secondary | ICD-10-CM | POA: Insufficient documentation

## 2013-03-10 DIAGNOSIS — Z79899 Other long term (current) drug therapy: Secondary | ICD-10-CM | POA: Insufficient documentation

## 2013-03-10 DIAGNOSIS — Z8701 Personal history of pneumonia (recurrent): Secondary | ICD-10-CM | POA: Insufficient documentation

## 2013-03-10 DIAGNOSIS — Z8719 Personal history of other diseases of the digestive system: Secondary | ICD-10-CM | POA: Insufficient documentation

## 2013-03-10 MED ORDER — AMOXICILLIN 250 MG PO CAPS
1000.0000 mg | ORAL_CAPSULE | Freq: Once | ORAL | Status: AC
  Administered 2013-03-10: 1000 mg via ORAL
  Filled 2013-03-10: qty 4

## 2013-03-10 MED ORDER — NAPROXEN 500 MG PO TABS: 500.0000 mg | ORAL_TABLET | Freq: Two times a day (BID) | ORAL | Status: DC

## 2013-03-10 MED ORDER — OXYCODONE-ACETAMINOPHEN 5-325 MG PO TABS: 1.0000 | ORAL_TABLET | ORAL | Status: DC | PRN

## 2013-03-10 MED ORDER — CETIRIZINE HCL 10 MG PO TABS: 10.0000 mg | ORAL_TABLET | Freq: Every day | ORAL | Status: DC

## 2013-03-10 MED ORDER — AMOXICILLIN 500 MG PO CAPS: 1000.0000 mg | ORAL_CAPSULE | Freq: Two times a day (BID) | ORAL | Status: DC

## 2013-03-10 MED ORDER — OXYCODONE-ACETAMINOPHEN 5-325 MG PO TABS
2.0000 | ORAL_TABLET | Freq: Once | ORAL | Status: AC
  Administered 2013-03-10: 2 via ORAL
  Filled 2013-03-10: qty 2

## 2013-03-10 NOTE — ED Notes (Addendum)
Patient was here a few days ago for same. Patient presents with facial swelling on the right side. Patient also complaining of a headache. No injury noted to area.

## 2013-03-10 NOTE — ED Provider Notes (Signed)
History  This chart was scribed for Robert Roller, MD by Bennett Scrape, ED Scribe. This patient was seen in room APA19/APA19 and the patient's care was started at 5:37 PM.  CSN: [id number]  Arrival date & time 03/10/13  1550   First MD Initiated Contact with Patient 03/10/13 1737      Chief Complaint  Patient presents with  . Facial Swelling    The history is provided by the patient. No language interpreter was used.    Robert Lyons is a 52 y.o. male who presents to the Emergency Department complaining of 7 days of gradual onset, non-changing, constant right sided HA described as stabbing that radiates down into the right TMJ with associated post-nasal drip that started yesterday. He reports occasional blurred vision that improves with "washing my eye" and pain with opening his mouth at the right TMJ that improves with OTC medications. He denies having any modifying factors. He was seen 5 days ago for the same and reports taking Reglan prescribed to him with no improvement. He reports that he is here for re-evaluation of the ongoing HA. He denies otalgia, fevers, chills and nausea as associated symptoms. He has a h/o HTN, GERD and DM and he is a former smoker but denies alcohol use.  Past Medical History  Diagnosis Date  . Hypertension   . GERD (gastroesophageal reflux disease)   . Pneumonia   . Diabetes mellitus     History reviewed. No pertinent past surgical history.  Family History  Problem Relation Age of Onset  . Diabetes Mother   . Hypertension Mother     History  Substance Use Topics  . Smoking status: Former Games developer  . Smokeless tobacco: Not on file  . Alcohol Use: No      Review of Systems  Constitutional: Negative for fever.  HENT: Positive for postnasal drip. Negative for ear pain.   Respiratory: Negative for cough.   Neurological: Positive for headaches.  All other systems reviewed and are negative.    Allergies  Review of patient's allergies  indicates no known allergies.  Home Medications   Current Outpatient Rx  Name  Route  Sig  Dispense  Refill  . acetaminophen (TYLENOL) 500 MG tablet   Oral   Take 500-1,000 mg by mouth every 6 (six) hours as needed. For pain         . lisinopril (PRINIVIL,ZESTRIL) 10 MG tablet   Oral   Take 10 mg by mouth daily.           . metFORMIN (GLUCOPHAGE) 1000 MG tablet   Oral   Take 1,000 mg by mouth 2 (two) times daily with a meal.         . naproxen (NAPROSYN) 500 MG tablet   Oral   Take 1 tablet (500 mg total) by mouth 2 (two) times daily.   14 tablet   0   . traMADol (ULTRAM) 50 MG tablet   Oral   Take 1 tablet (50 mg total) by mouth every 6 (six) hours as needed for pain.   15 tablet   0   . amoxicillin (AMOXIL) 500 MG capsule   Oral   Take 2 capsules (1,000 mg total) by mouth 2 (two) times daily.   40 capsule   0   . aspirin EC 325 MG tablet   Oral   Take 1 tablet (325 mg total) by mouth daily.   30 tablet   0   . cetirizine (ZYRTEC  ALLERGY) 10 MG tablet   Oral   Take 1 tablet (10 mg total) by mouth daily.   30 tablet   1   . metoCLOPramide (REGLAN) 10 MG tablet   Oral   Take 1 tablet (10 mg total) by mouth every 6 (six) hours as needed (nausea or headache).   30 tablet   0   . naproxen (NAPROSYN) 500 MG tablet   Oral   Take 1 tablet (500 mg total) by mouth 2 (two) times daily with a meal.   30 tablet   0   . oxyCODONE-acetaminophen (PERCOCET) 5-325 MG per tablet   Oral   Take 1 tablet by mouth every 4 (four) hours as needed for pain.   10 tablet   0     Triage Vitals: BP 139/82  Pulse 118  Temp(Src) 98 F (36.7 C)  Resp 18  Ht 5\' 11"  (1.803 m)  Wt 205 lb (92.987 kg)  BMI 28.6 kg/m2  SpO2 97%  Physical Exam  Nursing note and vitals reviewed. Constitutional: He is oriented to person, place, and time. He appears well-developed and well-nourished. No distress.  HENT:  Head: Normocephalic and atraumatic.  Mouth/Throat: Oropharynx is  clear and moist and mucous membranes are normal. No dental abscesses or dental caries.  Post nasal drip in the posterior oropharynx, right maxillary sinus tenderness that extends into the right zygomatic arch, tenderness to the right submandibular area, no lymphadenopathy, no trismus, dentition intact  Eyes: Conjunctivae and EOM are normal. Pupils are equal, round, and reactive to light.  Optic discs are clear, visual acuity intact  Neck: Normal range of motion. Neck supple. No tracheal deviation present.  Cardiovascular: Normal rate and regular rhythm.  Exam reveals no gallop and no friction rub.   No murmur heard. Pulmonary/Chest: Effort normal and breath sounds normal. No respiratory distress. He has no wheezes. He has no rales.  Musculoskeletal: Normal range of motion. He exhibits no edema and no tenderness.  Neurological: He is alert and oriented to person, place, and time.  Skin: Skin is warm and dry.  Psychiatric: He has a normal mood and affect. His behavior is normal.    ED Course  Procedures (including critical care time)  DIAGNOSTIC STUDIES: Oxygen Saturation is 97% on room air, normal by my interpretation.    COORDINATION OF CARE: 5:45 PM-Discussed treatment plan which includes CT of maxillofacial and mediations with pt at bedside and pt agreed to plan.   6:00 PM- Ordered two 5-325 mg percocet tablets  6:52 PM -Informed pt of radiology results. Discussed discharge plan which includes antibiotics and pain medications with pt and pt agreed to plan. Also advised pt to follow up as needed and pt agreed.   Ct Maxillofacial Wo Cm  03/10/2013  *RADIOLOGY REPORT*  Clinical Data: Several week history of right-sided facial pain.  CT MAXILLOFACIAL WITHOUT CONTRAST  Technique:  Multidetector CT imaging of the maxillofacial structures was performed. Multiplanar CT image reconstructions were also generated.  Comparison: Visualized paranasal sinuses on CT head and MRI brain 11/19/2012.   Findings: Interval development of opacification of anterior and middle ethmoid air cells on the right and near complete opacification of the right maxillary sinus.  Mild mucosal thickening involving the right sphenoid sinus.  Right frontal sinus aplastic.  Left frontal sinus, left ethmoid air cells, and left sphenoid sinus well-aerated.  Bilateral mastoid air cells and middle ear cavities well-aerated. Very slight bony nasal septal deviation to the right.  No visible anatomic  variants.  Orbits and globes intact.  IMPRESSION: Probable acute superimposed upon chronic right ethmoid and maxillary sinusitis.  Mild chronic right sphenoid sinus.  Remaining paranasal sinuses, bilateral mastoid air cells, and both middle ear cavities well-aerated.   Original Report Authenticated By: Hulan Saas, M.D.      1. Acute sinusitis       MDM  CT confirms acute sinusitis - pt has normal neuro exam, well appearing and tolerating PO, decongestant ordered, antibiotic ordered, stable for d/c.  Medications  amoxicillin (AMOXIL) capsule 1,000 mg (not administered)  oxyCODONE-acetaminophen (PERCOCET/ROXICET) 5-325 MG per tablet 2 tablet (2 tablets Oral Given 03/10/13 1753)   New Prescriptions   AMOXICILLIN (AMOXIL) 500 MG CAPSULE    Take 2 capsules (1,000 mg total) by mouth 2 (two) times daily.   CETIRIZINE (ZYRTEC ALLERGY) 10 MG TABLET    Take 1 tablet (10 mg total) by mouth daily.   NAPROXEN (NAPROSYN) 500 MG TABLET    Take 1 tablet (500 mg total) by mouth 2 (two) times daily with a meal.   OXYCODONE-ACETAMINOPHEN (PERCOCET) 5-325 MG PER TABLET    Take 1 tablet by mouth every 4 (four) hours as needed for pain.     I personally performed the services described in this documentation, which was scribed in my presence. The recorded information has been reviewed and is accurate.      Robert Roller, MD 03/10/13 [phone]

## 2013-06-24 ENCOUNTER — Emergency Department (HOSPITAL_COMMUNITY)
Admission: EM | Admit: 2013-06-24 | Discharge: 2013-06-24 | Disposition: A | Discharge status: Home / Self Care | Payer: Self-pay | Attending: Emergency Medicine | Admitting: Emergency Medicine

## 2013-06-24 ENCOUNTER — Encounter (HOSPITAL_COMMUNITY): Payer: Self-pay | Admitting: *Deleted

## 2013-06-24 ENCOUNTER — Emergency Department (HOSPITAL_COMMUNITY): Payer: Self-pay

## 2013-06-24 DIAGNOSIS — Z8701 Personal history of pneumonia (recurrent): Secondary | ICD-10-CM | POA: Insufficient documentation

## 2013-06-24 DIAGNOSIS — E119 Type 2 diabetes mellitus without complications: Secondary | ICD-10-CM | POA: Insufficient documentation

## 2013-06-24 DIAGNOSIS — M7732 Calcaneal spur, left foot: Secondary | ICD-10-CM

## 2013-06-24 DIAGNOSIS — J329 Chronic sinusitis, unspecified: Secondary | ICD-10-CM | POA: Insufficient documentation

## 2013-06-24 DIAGNOSIS — Z87891 Personal history of nicotine dependence: Secondary | ICD-10-CM | POA: Insufficient documentation

## 2013-06-24 DIAGNOSIS — Z7982 Long term (current) use of aspirin: Secondary | ICD-10-CM | POA: Insufficient documentation

## 2013-06-24 DIAGNOSIS — M7730 Calcaneal spur, unspecified foot: Secondary | ICD-10-CM | POA: Insufficient documentation

## 2013-06-24 DIAGNOSIS — Z79899 Other long term (current) drug therapy: Secondary | ICD-10-CM | POA: Insufficient documentation

## 2013-06-24 DIAGNOSIS — I10 Essential (primary) hypertension: Secondary | ICD-10-CM | POA: Insufficient documentation

## 2013-06-24 DIAGNOSIS — Z8719 Personal history of other diseases of the digestive system: Secondary | ICD-10-CM | POA: Insufficient documentation

## 2013-06-24 DIAGNOSIS — J3489 Other specified disorders of nose and nasal sinuses: Secondary | ICD-10-CM | POA: Insufficient documentation

## 2013-06-24 LAB — GLUCOSE, CAPILLARY: Glucose-Capillary: 152 mg/dL — ABNORMAL HIGH (ref 70–99)

## 2013-06-24 MED ORDER — HYDROCODONE-ACETAMINOPHEN 5-325 MG PO TABS
1.0000 | ORAL_TABLET | Freq: Once | ORAL | Status: AC
  Administered 2013-06-24: 1 via ORAL
  Filled 2013-06-24: qty 1

## 2013-06-24 MED ORDER — FLUTICASONE PROPIONATE 50 MCG/ACT NA SUSP: 2.0000 | Freq: Every day | NASAL | Status: DC

## 2013-06-24 MED ORDER — HYDROCODONE-ACETAMINOPHEN 5-325 MG PO TABS
ORAL_TABLET | ORAL | Status: AC
  Filled 2013-06-24: qty 1

## 2013-06-24 MED ORDER — AMOXICILLIN 500 MG PO CAPS: 500.0000 mg | ORAL_CAPSULE | Freq: Three times a day (TID) | ORAL | Status: DC

## 2013-06-24 MED ORDER — IBUPROFEN 800 MG PO TABS
800.0000 mg | ORAL_TABLET | Freq: Once | ORAL | Status: AC
  Administered 2013-06-24: 800 mg via ORAL
  Filled 2013-06-24: qty 1

## 2013-06-24 MED ORDER — HYDROCODONE-ACETAMINOPHEN 5-325 MG PO TABS: ORAL_TABLET | ORAL | Status: DC

## 2013-06-24 NOTE — ED Notes (Signed)
Left foot pain x 2 wks.  Denies injury.  Also c/o nasal congestion.

## 2013-06-24 NOTE — ED Provider Notes (Signed)
History    CSN: [id number] Arrival date & time 06/24/13  0904  First MD Initiated Contact with Patient 06/24/13 [phone]     Chief Complaint  Patient presents with  . Foot Pain  . Nasal Congestion   (Consider location/radiation/quality/duration/timing/severity/associated sxs/prior Treatment) HPI Comments: Robert Lyons is a 52 y.o. male who presents to the Emergency Department complaining of left heel pain for 2 weeks.  States pain is worse with weight bearing and improves with rest.  Describes the pain as sharp and feels like stepping on a nail.  He denies swelling, recent wound, or redness to his foot.  He also c/o nasal congestion and sinus pressure for weeks.  States he has recurrent sinus infections and usually improves with antibiotics.  He denies fever, vomiting or cough.  The history is provided by the patient.   Past Medical History  Diagnosis Date  . Hypertension   . GERD (gastroesophageal reflux disease)   . Pneumonia   . Diabetes mellitus    History reviewed. No pertinent past surgical history. Family History  Problem Relation Age of Onset  . Diabetes Mother   . Hypertension Mother    History  Substance Use Topics  . Smoking status: Former Games developer  . Smokeless tobacco: Not on file  . Alcohol Use: No    Review of Systems  Constitutional: Negative for fever and chills.  HENT: Positive for congestion and sinus pressure. Negative for ear pain, sore throat, facial swelling, trouble swallowing, neck pain and neck stiffness.   Respiratory: Negative for cough and shortness of breath.   Cardiovascular: Negative for chest pain.  Gastrointestinal: Negative for nausea and vomiting.  Genitourinary: Negative for dysuria and difficulty urinating.  Musculoskeletal: Positive for joint swelling, arthralgias and gait problem.  Skin: Negative for color change, rash and wound.  Neurological: Negative for syncope, weakness and numbness.  Hematological: Negative for adenopathy.   All other systems reviewed and are negative.    Allergies  Review of patient's allergies indicates no known allergies.  Home Medications   Current Outpatient Rx  Name  Route  Sig  Dispense  Refill  . aspirin EC 325 MG tablet   Oral   Take 1 tablet (325 mg total) by mouth daily.   30 tablet   0   . cetirizine (ZYRTEC) 10 MG tablet   Oral   Take 10 mg by mouth daily as needed for allergies.         [name] ibuprofen (ADVIL,MOTRIN) 600 MG tablet   Oral   Take 1,200 mg by mouth every 6 (six) hours as needed for pain.         [name] lisinopril (PRINIVIL,ZESTRIL) 10 MG tablet   Oral   Take 10 mg by mouth daily.           . metFORMIN (GLUCOPHAGE) 1000 MG tablet   Oral   Take 1,000 mg by mouth 2 (two) times daily with a meal.          BP 128/64  Pulse 71  Temp(Src) 97.7 F (36.5 C) (Oral)  Resp 20  Ht 5\' 11"  (1.803 m)  SpO2 99% Physical Exam  Nursing note and vitals reviewed. Constitutional: He is oriented to person, place, and time. He appears well-developed and well-nourished. No distress.  HENT:  Head: Normocephalic and atraumatic.  Right Ear: Tympanic membrane and ear canal normal.  Left Ear: Tympanic membrane and ear canal normal.  Nose: Mucosal edema present. Right sinus exhibits no maxillary sinus tenderness  and no frontal sinus tenderness. Left sinus exhibits no maxillary sinus tenderness and no frontal sinus tenderness.  Mouth/Throat: Uvula is midline, oropharynx is clear and moist and mucous membranes are normal.  Eyes: Conjunctivae and EOM are normal. Pupils are equal, round, and reactive to light.  Neck: Normal range of motion. Neck supple.  Cardiovascular: Normal rate, regular rhythm, normal heart sounds and intact distal pulses.   No murmur heard. Pulmonary/Chest: Effort normal and breath sounds normal. No respiratory distress.  Musculoskeletal: He exhibits tenderness.  Point ttp to the plantar left heel.  ROM is preserved.  DP pulse is brisk, sensation  intact.  No erythema, edema, abrasion, bruising or deformity.    Neurological: He is alert and oriented to person, place, and time. He exhibits normal muscle tone. Coordination normal.  Skin: Skin is warm and dry.    ED Course  Procedures (including critical care time) Labs Reviewed  GLUCOSE, CAPILLARY - Abnormal; Notable for the following:    Glucose-Capillary 152 (*)    All other components within normal limits   Dg Foot Complete Left  06/24/2013   *RADIOLOGY REPORT*  Clinical Data: Left heel pain for 2 weeks, diabetes  LEFT FOOT - COMPLETE 3+ VIEW  Comparison: None  Findings: Osseous mineralization grossly normal. Hallux valgus with degenerative changes first MTP joint and bony overgrowth at the medial margin of first metatarsal head. Prominent medial tarsal navicular question congenital fusion of an accessory ossicle. Small plantar calcaneal spur. No definite acute fracture, dislocation, or bone destruction.  IMPRESSION: Calcaneal spurring. 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   Original Report Authenticated By: Ulyses Southward, M.D.    MDM    Patient advised of x-ray finding.  VSS.  Advised to elevate, ice, use a heel insert in his shoe and given referral for podiatry.  Will treat with abx, steroid nasal spray and vicodin for pain.  Pt agrees to f/u with his PMD.  VSS.  Stable for discharge.  Makynli Stills L. Trisha Mangle, PA-C 06/25/13 1819

## 2013-06-26 NOTE — ED Provider Notes (Signed)
Medical screening examination/treatment/procedure(s) were performed by non-physician practitioner and as supervising physician I was immediately available for consultation/collaboration. Benen Weida, MD, FACEP   Takeela Peil L Janai Brannigan, MD 06/26/13 1221 

## 2013-09-10 ENCOUNTER — Encounter (HOSPITAL_COMMUNITY): Payer: Self-pay

## 2013-09-10 ENCOUNTER — Emergency Department (HOSPITAL_COMMUNITY): Payer: Self-pay

## 2013-09-10 ENCOUNTER — Emergency Department (HOSPITAL_COMMUNITY)
Admission: EM | Admit: 2013-09-10 | Discharge: 2013-09-10 | Disposition: A | Discharge status: Home / Self Care | Payer: Self-pay | Attending: Emergency Medicine | Admitting: Emergency Medicine

## 2013-09-10 DIAGNOSIS — J329 Chronic sinusitis, unspecified: Secondary | ICD-10-CM | POA: Insufficient documentation

## 2013-09-10 DIAGNOSIS — R509 Fever, unspecified: Secondary | ICD-10-CM | POA: Insufficient documentation

## 2013-09-10 DIAGNOSIS — R071 Chest pain on breathing: Secondary | ICD-10-CM | POA: Insufficient documentation

## 2013-09-10 DIAGNOSIS — I10 Essential (primary) hypertension: Secondary | ICD-10-CM | POA: Insufficient documentation

## 2013-09-10 DIAGNOSIS — Z8719 Personal history of other diseases of the digestive system: Secondary | ICD-10-CM | POA: Insufficient documentation

## 2013-09-10 DIAGNOSIS — Z8701 Personal history of pneumonia (recurrent): Secondary | ICD-10-CM | POA: Insufficient documentation

## 2013-09-10 DIAGNOSIS — J3489 Other specified disorders of nose and nasal sinuses: Secondary | ICD-10-CM | POA: Insufficient documentation

## 2013-09-10 DIAGNOSIS — Z794 Long term (current) use of insulin: Secondary | ICD-10-CM | POA: Insufficient documentation

## 2013-09-10 DIAGNOSIS — R51 Headache: Secondary | ICD-10-CM | POA: Insufficient documentation

## 2013-09-10 DIAGNOSIS — Z79899 Other long term (current) drug therapy: Secondary | ICD-10-CM | POA: Insufficient documentation

## 2013-09-10 DIAGNOSIS — E119 Type 2 diabetes mellitus without complications: Secondary | ICD-10-CM | POA: Insufficient documentation

## 2013-09-10 LAB — GLUCOSE, CAPILLARY: Glucose-Capillary: 181 mg/dL — ABNORMAL HIGH (ref 70–99)

## 2013-09-10 MED ORDER — HYDROCODONE-ACETAMINOPHEN 5-325 MG PO TABS
1.0000 | ORAL_TABLET | Freq: Once | ORAL | Status: AC
  Administered 2013-09-10: 1 via ORAL
  Filled 2013-09-10: qty 1

## 2013-09-10 MED ORDER — PSEUDOEPHEDRINE HCL 60 MG PO TABS: 60.0000 mg | ORAL_TABLET | Freq: Four times a day (QID) | ORAL | Status: DC | PRN

## 2013-09-10 MED ORDER — AMOXICILLIN 250 MG PO CAPS
500.0000 mg | ORAL_CAPSULE | Freq: Once | ORAL | Status: AC
  Administered 2013-09-10: 500 mg via ORAL
  Filled 2013-09-10: qty 1

## 2013-09-10 MED ORDER — BACITRACIN ZINC 500 UNIT/GM EX OINT
TOPICAL_OINTMENT | CUTANEOUS | Status: AC
  Filled 2013-09-10: qty 0.9

## 2013-09-10 MED ORDER — AMOXICILLIN 500 MG PO CAPS: 500.0000 mg | ORAL_CAPSULE | Freq: Three times a day (TID) | ORAL | Status: AC

## 2013-09-10 MED ORDER — HYDROCODONE-ACETAMINOPHEN 5-325 MG PO TABS: 1.0000 | ORAL_TABLET | ORAL | Status: DC | PRN

## 2013-09-10 NOTE — ED Notes (Signed)
Sinus infection, cough

## 2013-09-10 NOTE — ED Notes (Signed)
Pt c/o sinus drainage, cough, and rib pain.  Reports had sinus infection approx 1 month ago.  Says was treated with antibiotics and it got better but now has cold symptoms again.

## 2013-09-12 NOTE — ED Provider Notes (Signed)
CSN: [id number]     Arrival date & time 09/10/13  1520 History   First MD Initiated Contact with Patient 09/10/13 1601     Chief Complaint  Patient presents with  . URI   (Consider location/radiation/quality/duration/timing/severity/associated sxs/prior Treatment) HPI Comments: Robert Lyons is a 52 y.o. male   presenting with a one week history of uri type symptoms which includes nasal congestion with thick, sometimes blood tinged drainage, facial pain, post nasal drip and sputum producing cough which has been persistent to the point of now having lower anterior chest wall pain with coughing.  He has had subjective fever.  Symptoms due to not include shortness of breath,nausea, vomiting or diarrhea.  The patient has taken no medicines  prior to arrival and he has found no alleviators. He has not smoked cigarettes in over 5 years.  He does have a history of prior sinus infections, stating was treated for this last month.  The history is provided by the patient.    Past Medical History  Diagnosis Date  . Hypertension   . GERD (gastroesophageal reflux disease)   . Pneumonia   . Diabetes mellitus    History reviewed. No pertinent past surgical history. Family History  Problem Relation Age of Onset  . Diabetes Mother   . Hypertension Mother    History  Substance Use Topics  . Smoking status: Former Games developer  . Smokeless tobacco: Not on file  . Alcohol Use: No    Review of Systems  Constitutional: Positive for fever.  HENT: Positive for congestion, rhinorrhea, postnasal drip and sinus pressure. Negative for ear pain, sore throat, facial swelling, neck pain and voice change.   Eyes: Negative.   Respiratory: Positive for cough. Negative for chest tightness, shortness of breath and wheezing.   Cardiovascular: Positive for chest pain. Negative for leg swelling.  Gastrointestinal: Negative for nausea and abdominal pain.  Genitourinary: Negative.   Musculoskeletal: Negative for  joint swelling and arthralgias.  Skin: Negative.  Negative for rash and wound.  Neurological: Negative for dizziness, weakness, light-headedness, numbness and headaches.  Psychiatric/Behavioral: Negative.     Allergies  Review of patient's allergies indicates no known allergies.  Home Medications   Current Outpatient Rx  Name  Route  Sig  Dispense  Refill  . insulin detemir (LEVEMIR) 100 UNIT/ML injection   Subcutaneous   Inject 20 Units into the skin at bedtime.         . insulin NPH-regular (NOVOLIN 70/30) (70-30) 100 UNIT/ML injection   Subcutaneous   Inject 10 Units into the skin 3 (three) times daily.         [name] lisinopril (PRINIVIL,ZESTRIL) 10 MG tablet   Oral   Take 10 mg by mouth daily.           . metFORMIN (GLUCOPHAGE) 1000 MG tablet   Oral   Take 1,000 mg by mouth 2 (two) times daily with a meal.         . naproxen sodium (ANAPROX) 220 MG tablet   Oral   Take 440 mg by mouth daily.         [name] amoxicillin (AMOXIL) 500 MG capsule   Oral   Take 1 capsule (500 mg total) by mouth 3 (three) times daily.   30 capsule   0   . HYDROcodone-acetaminophen (NORCO/VICODIN) 5-325 MG per tablet   Oral   Take 1 tablet by mouth every 4 (four) hours as needed for pain.   15 tablet   0   .  pseudoephedrine (SUDAFED) 60 MG tablet   Oral   Take 1 tablet (60 mg total) by mouth every 6 (six) hours as needed for congestion.   30 tablet   0    BP 129/68  Pulse 73  Temp(Src) 98.3 F (36.8 C) (Oral)  Resp 18  SpO2 96% Physical Exam  Constitutional: He is oriented to person, place, and time. He appears well-developed and well-nourished.  HENT:  Head: Normocephalic and atraumatic.  Right Ear: Tympanic membrane and ear canal normal.  Left Ear: Tympanic membrane and ear canal normal.  Nose: Mucosal edema and rhinorrhea present. Right sinus exhibits maxillary sinus tenderness.  Mouth/Throat: Uvula is midline, oropharynx is clear and moist and mucous membranes are  normal. No oropharyngeal exudate, posterior oropharyngeal edema, posterior oropharyngeal erythema or tonsillar abscesses.  Eyes: Conjunctivae are normal.  Cardiovascular: Normal rate and normal heart sounds.   Pulmonary/Chest: Effort normal. No respiratory distress. He has no wheezes. He has no rhonchi. He has no rales.  ttp across lower anterior ribs, no crepitus, no palpable deformity.  Abdominal: Soft. There is no tenderness.  Musculoskeletal: Normal range of motion.  Neurological: He is alert and oriented to person, place, and time.  Skin: Skin is warm and dry. No rash noted.  Psychiatric: He has a normal mood and affect.    ED Course  Procedures (including critical care time) Labs Review Labs Reviewed  GLUCOSE, CAPILLARY - Abnormal; Notable for the following:    Glucose-Capillary 181 (*)    All other components within normal limits   Imaging Review Dg Chest 2 View  09/10/2013   CLINICAL DATA:  52 year old male cough, upper respiratory tract infection.  EXAM: CHEST  2 VIEW  COMPARISON:  10/17/2012 and earlier.  FINDINGS: Larger lung volumes. Normal cardiac size and mediastinal contours. Visualized tracheal air column is within normal limits. The lungs are clear. No pneumothorax or effusion. No acute osseous abnormality identified.  IMPRESSION: Negative, no acute cardiopulmonary abnormality.   Electronically Signed   By: Augusto Gamble M.D.   On: 09/10/2013 15:59    MDM   1. Sinusitis    Acute sinusitis.  Pt was prescribed amoxil, hydrocodone for cough suppression, chest wall pain,  Sudafed.  Recommended peppermints/ vicks inhaler for additional nasal congestion relief.  Rest,  Increased fluid intake.  Recheck for worsened sx.    Review of chart - he was last seen here 3 months ago for sinus infection.  He completed abx, was unable to afford steroid nasal spray.  His symptoms resolved until this week.    Burgess Amor, PA-C 09/12/13 1327

## 2013-09-13 NOTE — ED Provider Notes (Signed)
Medical screening examination/treatment/procedure(s) were performed by non-physician practitioner and as supervising physician I was immediately available for consultation/collaboration.   [name], MD 09/13/13 0900

## 2013-12-02 ENCOUNTER — Emergency Department (HOSPITAL_COMMUNITY)
Admission: EM | Admit: 2013-12-02 | Discharge: 2013-12-02 | Disposition: A | Discharge status: Home / Self Care | Payer: Self-pay | Attending: Emergency Medicine | Admitting: Emergency Medicine

## 2013-12-02 ENCOUNTER — Encounter (HOSPITAL_COMMUNITY): Payer: Self-pay | Admitting: Emergency Medicine

## 2013-12-02 ENCOUNTER — Emergency Department (HOSPITAL_COMMUNITY): Payer: Self-pay

## 2013-12-02 DIAGNOSIS — Z8719 Personal history of other diseases of the digestive system: Secondary | ICD-10-CM | POA: Insufficient documentation

## 2013-12-02 DIAGNOSIS — R0789 Other chest pain: Secondary | ICD-10-CM | POA: Insufficient documentation

## 2013-12-02 DIAGNOSIS — R509 Fever, unspecified: Secondary | ICD-10-CM | POA: Insufficient documentation

## 2013-12-02 DIAGNOSIS — Z79899 Other long term (current) drug therapy: Secondary | ICD-10-CM | POA: Insufficient documentation

## 2013-12-02 DIAGNOSIS — M545 Low back pain, unspecified: Secondary | ICD-10-CM | POA: Insufficient documentation

## 2013-12-02 DIAGNOSIS — R0602 Shortness of breath: Secondary | ICD-10-CM | POA: Insufficient documentation

## 2013-12-02 DIAGNOSIS — Z87891 Personal history of nicotine dependence: Secondary | ICD-10-CM | POA: Insufficient documentation

## 2013-12-02 DIAGNOSIS — I10 Essential (primary) hypertension: Secondary | ICD-10-CM | POA: Insufficient documentation

## 2013-12-02 DIAGNOSIS — R079 Chest pain, unspecified: Secondary | ICD-10-CM

## 2013-12-02 DIAGNOSIS — R51 Headache: Secondary | ICD-10-CM | POA: Insufficient documentation

## 2013-12-02 DIAGNOSIS — E119 Type 2 diabetes mellitus without complications: Secondary | ICD-10-CM

## 2013-12-02 DIAGNOSIS — M79609 Pain in unspecified limb: Secondary | ICD-10-CM | POA: Insufficient documentation

## 2013-12-02 DIAGNOSIS — R739 Hyperglycemia, unspecified: Secondary | ICD-10-CM

## 2013-12-02 DIAGNOSIS — Z8701 Personal history of pneumonia (recurrent): Secondary | ICD-10-CM | POA: Insufficient documentation

## 2013-12-02 LAB — GLUCOSE, CAPILLARY: Glucose-Capillary: 93 mg/dL (ref 70–99)

## 2013-12-02 LAB — BASIC METABOLIC PANEL
CO2: 24 mEq/L (ref 19–32)
Calcium: 9.3 mg/dL (ref 8.4–10.5)
Chloride: 101 mEq/L (ref 96–112)
Creatinine, Ser: 0.87 mg/dL (ref 0.50–1.35)
Glucose, Bld: 412 mg/dL — ABNORMAL HIGH (ref 70–99)
Sodium: 137 mEq/L (ref 135–145)

## 2013-12-02 LAB — POCT I-STAT TROPONIN I: Troponin i, poc: 0 ng/mL (ref 0.00–0.08)

## 2013-12-02 LAB — CBC
Hemoglobin: 13 g/dL (ref 13.0–17.0)
MCH: 29.9 pg (ref 26.0–34.0)
MCV: 87.6 fL (ref 78.0–100.0)
RBC: 4.35 MIL/uL (ref 4.22–5.81)
WBC: 6.6 10*3/uL (ref 4.0–10.5)

## 2013-12-02 MED ORDER — METFORMIN HCL 1000 MG PO TABS: 1000.0000 mg | ORAL_TABLET | Freq: Two times a day (BID) | ORAL | Status: DC

## 2013-12-02 MED ORDER — HYDROCODONE-ACETAMINOPHEN 5-325 MG PO TABS
1.0000 | ORAL_TABLET | Freq: Once | ORAL | Status: AC
  Administered 2013-12-02: 1 via ORAL
  Filled 2013-12-02: qty 1

## 2013-12-02 MED ORDER — LISINOPRIL-HYDROCHLOROTHIAZIDE 10-12.5 MG PO TABS: 1.0000 | ORAL_TABLET | Freq: Every day | ORAL | Status: DC

## 2013-12-02 MED ORDER — INSULIN ASPART 100 UNIT/ML ~~LOC~~ SOLN
10.0000 [IU] | Freq: Once | SUBCUTANEOUS | Status: AC
  Administered 2013-12-02: 100 [IU] via INTRAVENOUS
  Filled 2013-12-02: qty 1

## 2013-12-02 MED ORDER — SODIUM CHLORIDE 0.9 % IV SOLN: INTRAVENOUS | Status: DC

## 2013-12-02 MED ORDER — SODIUM CHLORIDE 0.9 % IV BOLUS (SEPSIS)
1000.0000 mL | Freq: Once | INTRAVENOUS | Status: AC
  Administered 2013-12-02: 1000 mL via INTRAVENOUS

## 2013-12-02 MED ORDER — HYDROCODONE-ACETAMINOPHEN 5-325 MG PO TABS: 1.0000 | ORAL_TABLET | Freq: Four times a day (QID) | ORAL | Status: DC | PRN

## 2013-12-02 NOTE — ED Provider Notes (Signed)
`CSN: [id number]     Arrival date & time 12/02/13  5621 History   First MD Initiated Contact with Patient 12/02/13 1021     This chart was scribed for [name], MD by Ellin Mayhew, ED Scribe. This patient was seen in room APA16A/APA16A and the patient's care was started at 11:24 AM.  Chief Complaint  Patient presents with  . Chest Pain   (Consider location/radiation/quality/duration/timing/severity/associated sxs/prior Treatment)  Patient is a 52 y.o. male presenting with chest pain. The history is provided by the patient. No language interpreter was used.  Chest Pain Associated symptoms: back pain, fever (fever last night), headache and shortness of breath   Associated symptoms: no abdominal pain, no cough, no nausea and not vomiting     HPI Comments: Robert Lyons is a 52 y.o. male who presents to the Emergency Department by ambulance due to constant and sharp upper bilateral CP. He was at the free clinic for the pain and was transferred to the ED due to an abnormal EKG. The pain began 2 weeks ago, and was worse 2 days ago. Pt rates the pain as an 8/10 currently, and was 10 prior to the NTG that was given at the clinic. He denies any radiation to the back, neck, or arm. Pt denies any nausea.   He has a secondary complaint of L-back pain with an associated burning feeling to his feet and anterior shins for the past two weeks. He has had transient relief by soaking. Pt has a history of diabetes and HTN. Pt normally takes insulin but was recently instructed to stop. He admits he has been out of his medications for the past several weeks.   No PCP currently. Dr. Sudie Bailey in past.   Past Medical History  Diagnosis Date  . Hypertension   . GERD (gastroesophageal reflux disease)   . Pneumonia   . Diabetes mellitus    History reviewed. No pertinent past surgical history. Family History  Problem Relation Age of Onset  . Diabetes Mother   . Hypertension Mother     History  Substance Use Topics  . Smoking status: Former Games developer  . Smokeless tobacco: Not on file  . Alcohol Use: No    Review of Systems  Constitutional: Positive for fever (fever last night). Negative for chills.  HENT: Negative for congestion and sore throat.   Eyes: Negative for visual disturbance.  Respiratory: Positive for shortness of breath. Negative for cough.   Cardiovascular: Negative for chest pain and leg swelling.  Gastrointestinal: Negative for nausea, vomiting, abdominal pain and diarrhea.  Genitourinary: Negative for dysuria.  Musculoskeletal: Positive for back pain and myalgias (foot and anterior shins). Negative for neck pain.  Skin: Negative for rash.  Neurological: Positive for headaches.  Hematological: Does not bruise/bleed easily.  Psychiatric/Behavioral: Negative for confusion.    Allergies  Review of patient's allergies indicates no known allergies.  Home Medications   Current Outpatient Rx  Name  Route  Sig  Dispense  Refill  . acetaminophen (TYLENOL) 500 MG tablet   Oral   Take 1,000 mg by mouth every 6 (six) hours as needed for mild pain.         [name] ibuprofen (ADVIL,MOTRIN) 200 MG tablet   Oral   Take 800 mg by mouth every 6 (six) hours as needed for moderate pain.         [name] HYDROcodone-acetaminophen (NORCO/VICODIN) 5-325 MG per tablet   Oral   Take 1-2 tablets by mouth every  6 (six) hours as needed for moderate pain.   14 tablet   0   . lisinopril-hydrochlorothiazide (PRINZIDE,ZESTORETIC) 10-12.5 MG per tablet   Oral   Take 1 tablet by mouth daily.   30 tablet   0   . metFORMIN (GLUCOPHAGE) 1000 MG tablet   Oral   Take 1 tablet (1,000 mg total) by mouth 2 (two) times daily.   60 tablet   0    Triage Vitals: BP 137/81  Pulse 66  Temp(Src) 97.8 F (36.6 C) (Oral)  Resp 19  Wt 198 lb (89.812 kg)  SpO2 96%  Physical Exam  Nursing note and vitals reviewed. Constitutional: He is oriented to person, place, and time. He  appears well-developed and well-nourished. No distress.  HENT:  Head: Normocephalic and atraumatic.  Mouth/Throat: Oropharynx is clear and moist.  Eyes: Conjunctivae and EOM are normal. Pupils are equal, round, and reactive to light.  Sclera are clear  Neck: Neck supple. No tracheal deviation present.  Cardiovascular: Normal rate and regular rhythm.   No murmur heard. Pulses:      Dorsalis pedis pulses are 2+ on the right side, and 2+ on the left side.  Pulmonary/Chest: Effort normal and breath sounds normal. No respiratory distress. He has no wheezes.  Abdominal: Soft. Bowel sounds are normal. He exhibits no distension. There is no tenderness.  Musculoskeletal: Normal range of motion. He exhibits no edema (no ankle swelling).  Lymphadenopathy:    He has no cervical adenopathy.  Neurological: He is alert and oriented to person, place, and time. No cranial nerve deficit.  Pt able to move both sets of fingers and toes  Skin: Skin is warm and dry. No rash noted.  Psychiatric: He has a normal mood and affect. His behavior is normal.    ED Course  Procedures (including critical care time)  DIAGNOSTIC STUDIES: Oxygen Saturation is 96% on room air, adequate by my interpretation.    COORDINATION OF CARE: 11:34 AM-CXR, CBC, BNP ordered. Treatment plan discussed with patient and patient agrees.      Labs Review Labs Reviewed  CBC - Abnormal; Notable for the following:    HCT 38.1 (*)    All other components within normal limits  BASIC METABOLIC PANEL - Abnormal; Notable for the following:    Glucose, Bld 412 (*)    All other components within normal limits  GLUCOSE, CAPILLARY  POCT I-STAT TROPONIN I   Results for orders placed during the hospital encounter of 12/02/13  CBC      Result Value Range   WBC 6.6  4.0 - 10.5 K/uL   RBC 4.35  4.22 - 5.81 MIL/uL   Hemoglobin 13.0  13.0 - 17.0 g/dL   HCT 16.1 (*) 09.6 - 04.5 %   MCV 87.6  78.0 - 100.0 fL   MCH 29.9  26.0 - 34.0 pg    MCHC 34.1  30.0 - 36.0 g/dL   RDW 40.9  81.1 - 91.4 %   Platelets 227  150 - 400 K/uL  BASIC METABOLIC PANEL      Result Value Range   Sodium 137  135 - 145 mEq/L   Potassium 4.0  3.5 - 5.1 mEq/L   Chloride 101  96 - 112 mEq/L   CO2 24  19 - 32 mEq/L   Glucose, Bld 412 (*) 70 - 99 mg/dL   BUN 8  6 - 23 mg/dL   Creatinine, Ser 7.82  0.50 - 1.35 mg/dL   Calcium  9.3  8.4 - 10.5 mg/dL   GFR calc non Af Amer >90  >90 mL/min   GFR calc Af Amer >90  >90 mL/min  GLUCOSE, CAPILLARY      Result Value Range   Glucose-Capillary 93  70 - 99 mg/dL  POCT I-STAT TROPONIN I      Result Value Range   Troponin i, poc 0.00  0.00 - 0.08 ng/mL   Comment 3             Imaging Review Dg Chest 2 View  12/02/2013   CLINICAL DATA:  Chest pain.  EXAM: CHEST  2 VIEW  COMPARISON:  09/10/2013 and earlier  FINDINGS: The cardiomediastinal silhouette is within normal limits. The lungs are well inflated. 7 mm nodular density in the right lung base overlying the anterior 6th rib does not appear significantly changed from 07/16/2012. The lungs are otherwise clear. There is no evidence of pleural effusion or pneumothorax. No acute osseous abnormality is identified.  IMPRESSION: 1. No evidence of active cardiopulmonary disease. 2. Unchanged nodular density in the right lung base.   Electronically Signed   By: Sebastian Ache   On: 12/02/2013 12:52    EKG Interpretation    Date/Time:  Monday December 02 2013 09:48:27 EST Ventricular Rate:  70 PR Interval:  170 QRS Duration: 96 QT Interval:  392 QTC Calculation: 423 R Axis:   23 Text Interpretation:  Normal sinus rhythm Septal infarct , age undetermined Abnormal ECG When compared with ECG of 24-Feb-2013 21:55, No significant change was found Confirmed by Charlesia Canaday  MD, Tredarius Cobern ([id number]) on 12/02/2013 11:11:49 AM            MDM   1. Chest pain   2. Hyperglycemia   3. Diabetes    Workup for several days of chest pain negative troponin negative EKG for any acute  changes. Chest x-rays negative for any acute cardiopulmonary disease. No evidence of pneumonia pneumothorax or pulmonary edema. Patient's blood sugar was in the 400 range patient has been out of his diabetic medicine and his hypertensive medicines the blood pressure is fairly normal here today. Both will be renewed. Patient has been followed by the health department in the past she's encouraged routine followup with them.  As stated patient's been out of both his blood pressure medicine and his diabetic medicine for several days to weeks.  I personally performed the services described in this documentation, which was scribed in my presence. The recorded information has been reviewed and is accurate.     [name], MD 12/02/13 [phone]

## 2013-12-02 NOTE — ED Notes (Addendum)
Pt reports back and mid chest pain for "several weeks" that has been worse for "a few days". Came to clinic today and had EKG. Reports SOB. NAD. Reports noncompliance with diabetes medications. Received nitro 0.4 SL in office and Aspirin 325mg . CBG was 420.

## 2014-02-23 ENCOUNTER — Emergency Department (HOSPITAL_COMMUNITY)
Admission: EM | Admit: 2014-02-23 | Discharge: 2014-02-23 | Disposition: A | Discharge status: Home / Self Care | Payer: Self-pay | Attending: Emergency Medicine | Admitting: Emergency Medicine

## 2014-02-23 ENCOUNTER — Emergency Department (HOSPITAL_COMMUNITY): Payer: Self-pay

## 2014-02-23 ENCOUNTER — Encounter (HOSPITAL_COMMUNITY): Payer: Self-pay | Admitting: Emergency Medicine

## 2014-02-23 DIAGNOSIS — I10 Essential (primary) hypertension: Secondary | ICD-10-CM | POA: Insufficient documentation

## 2014-02-23 DIAGNOSIS — Z8701 Personal history of pneumonia (recurrent): Secondary | ICD-10-CM | POA: Insufficient documentation

## 2014-02-23 DIAGNOSIS — Z7982 Long term (current) use of aspirin: Secondary | ICD-10-CM | POA: Insufficient documentation

## 2014-02-23 DIAGNOSIS — R110 Nausea: Secondary | ICD-10-CM | POA: Insufficient documentation

## 2014-02-23 DIAGNOSIS — J209 Acute bronchitis, unspecified: Secondary | ICD-10-CM | POA: Insufficient documentation

## 2014-02-23 DIAGNOSIS — R51 Headache: Secondary | ICD-10-CM | POA: Insufficient documentation

## 2014-02-23 DIAGNOSIS — M25519 Pain in unspecified shoulder: Secondary | ICD-10-CM | POA: Insufficient documentation

## 2014-02-23 DIAGNOSIS — Z79899 Other long term (current) drug therapy: Secondary | ICD-10-CM | POA: Insufficient documentation

## 2014-02-23 DIAGNOSIS — Z8719 Personal history of other diseases of the digestive system: Secondary | ICD-10-CM | POA: Insufficient documentation

## 2014-02-23 DIAGNOSIS — R6883 Chills (without fever): Secondary | ICD-10-CM | POA: Insufficient documentation

## 2014-02-23 DIAGNOSIS — J3489 Other specified disorders of nose and nasal sinuses: Secondary | ICD-10-CM | POA: Insufficient documentation

## 2014-02-23 DIAGNOSIS — M898X1 Other specified disorders of bone, shoulder: Secondary | ICD-10-CM

## 2014-02-23 DIAGNOSIS — F17200 Nicotine dependence, unspecified, uncomplicated: Secondary | ICD-10-CM | POA: Insufficient documentation

## 2014-02-23 DIAGNOSIS — E119 Type 2 diabetes mellitus without complications: Secondary | ICD-10-CM | POA: Insufficient documentation

## 2014-02-23 LAB — CBG MONITORING, ED: GLUCOSE-CAPILLARY: 105 mg/dL — AB (ref 70–99)

## 2014-02-23 MED ORDER — PREDNISONE 50 MG PO TABS
60.0000 mg | ORAL_TABLET | Freq: Once | ORAL | Status: AC
  Administered 2014-02-23: 60 mg via ORAL
  Filled 2014-02-23 (×2): qty 1

## 2014-02-23 MED ORDER — ALBUTEROL SULFATE (2.5 MG/3ML) 0.083% IN NEBU
2.5000 mg | INHALATION_SOLUTION | Freq: Once | RESPIRATORY_TRACT | Status: AC
  Administered 2014-02-23: 2.5 mg via RESPIRATORY_TRACT
  Filled 2014-02-23: qty 3

## 2014-02-23 MED ORDER — AMOXICILLIN 500 MG PO CAPS: 500.0000 mg | ORAL_CAPSULE | Freq: Three times a day (TID) | ORAL | Status: DC

## 2014-02-23 MED ORDER — CYCLOBENZAPRINE HCL 10 MG PO TABS: 10.0000 mg | ORAL_TABLET | Freq: Three times a day (TID) | ORAL | Status: DC | PRN

## 2014-02-23 MED ORDER — IPRATROPIUM-ALBUTEROL 0.5-2.5 (3) MG/3ML IN SOLN
3.0000 mL | Freq: Once | RESPIRATORY_TRACT | Status: AC
  Administered 2014-02-23: 3 mL via RESPIRATORY_TRACT
  Filled 2014-02-23: qty 3

## 2014-02-23 MED ORDER — PREDNISONE 20 MG PO TABS: ORAL_TABLET | ORAL | Status: DC

## 2014-02-23 MED ORDER — ALBUTEROL SULFATE (2.5 MG/3ML) 0.083% IN NEBU: 5.0000 mg | INHALATION_SOLUTION | Freq: Once | RESPIRATORY_TRACT | Status: DC

## 2014-02-23 MED ORDER — IPRATROPIUM BROMIDE 0.02 % IN SOLN: 0.5000 mg | Freq: Once | RESPIRATORY_TRACT | Status: DC

## 2014-02-23 MED ORDER — ALBUTEROL SULFATE HFA 108 (90 BASE) MCG/ACT IN AERS
2.0000 | INHALATION_SPRAY | RESPIRATORY_TRACT | Status: DC | PRN
  Administered 2014-02-23: 2 via RESPIRATORY_TRACT
  Filled 2014-02-23: qty 6.7

## 2014-02-23 MED ORDER — AEROCHAMBER Z-STAT PLUS/MEDIUM MISC
1.0000 | Freq: Once | Status: DC
  Filled 2014-02-23: qty 1

## 2014-02-23 MED ORDER — IBUPROFEN 800 MG PO TABS
800.0000 mg | ORAL_TABLET | Freq: Once | ORAL | Status: AC
  Administered 2014-02-23: 800 mg via ORAL
  Filled 2014-02-23: qty 1

## 2014-02-23 MED ORDER — CYCLOBENZAPRINE HCL 10 MG PO TABS
10.0000 mg | ORAL_TABLET | Freq: Once | ORAL | Status: AC
  Administered 2014-02-23: 10 mg via ORAL
  Filled 2014-02-23: qty 1

## 2014-02-23 NOTE — ED Provider Notes (Signed)
CSN: [id number]     Arrival date & time 02/23/14  1414 History  This chart was scribed for Robert Givens, MD by Quintella Reichert, ED scribe.  This patient was seen in room APA07/APA07 and the patient's care was started at 2:41 PM.   Chief Complaint  Patient presents with  . Shortness of Breath    The history is provided by the patient. No language interpreter was used.    HPI Comments: Robert Lyons is a 53 y.o. male with h/o DM and HTN who presents to the Emergency Department complaining of SOB that began one week ago with associated cough, nasal congestion, rhinorrhea, and facial pressure.  Pt reports his cough is productive of green sputum and he has also been blowing green mucus out of his nose occasionally.  He also reports occasional wheezing. He has used an inhaler in the past, but it has run out.  He states he did have a sore throat 3-4 days ago but this has since resolved.  He states he had a subjective fever and chills 2-3 days ago but this has since resolved.  He also complains of bilateral upper back pain worse on the right side, which also began one week ago.  Pain is worsened by coughing and certain movements.  He denies recent injuries or unusual activities that may have brought on pain.  He reports that he had similar pain 7-8 years ago and was hospitalized for pneumonia.  He also reports nausea but denies vomiting.  He denies diarrhea.  Pt is a current half-pack-per-day smoker.  He does not drink.  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  PCP is at [facility] [place]   Past Medical History  Diagnosis Date  . Hypertension   . GERD (gastroesophageal reflux disease)   . Pneumonia   . Diabetes mellitus     History reviewed. No pertinent past surgical history.   Family History  Problem Relation Age of Onset  . Diabetes Mother   . Hypertension Mother     History  Substance Use Topics  . Smoking status: Current Every  Day Smoker    Types: Cigarettes  . Smokeless tobacco: Not on file  . Alcohol Use: No   Takes care of his niece who has sickle cell disease Smokes 1/2 ppd  Review of Systems  Constitutional: Positive for fever (several days ago, resolved) and chills.  HENT: Positive for congestion, rhinorrhea, sinus pressure and sore throat (3-4 days ago, resolved).   Respiratory: Positive for cough, shortness of breath and wheezing.   Gastrointestinal: Positive for nausea. Negative for vomiting and diarrhea.  Musculoskeletal: Positive for back pain.      Allergies  Review of patient's allergies indicates no known allergies.  Home Medications   Current Outpatient Rx  Name  Route  Sig  Dispense  Refill  . aspirin 325 MG tablet   Oral   Take 325 mg by mouth daily.         [name] glipiZIDE (GLUCOTROL) 5 MG tablet   Oral   Take 5 mg by mouth daily.         [name] lisinopril-hydrochlorothiazide (PRINZIDE,ZESTORETIC) 10-12.5 MG per tablet   Oral   Take 1 tablet by mouth daily.         . metFORMIN (GLUCOPHAGE) 1000 MG tablet   Oral   Take 1 tablet (1,000 mg total) by mouth 2 (two) times daily.  60 tablet   0   . Omega-3 Fatty Acids (FISH OIL) 1000 MG CAPS   Oral   Take 1 capsule by mouth 2 (two) times daily.          BP 132/76  Pulse 108  Temp(Src) 97.3 F (36.3 C) (Oral)  Resp 18  Ht 5\' 11"  (1.803 m)  Wt 200 lb (90.719 kg)  BMI 27.91 kg/m2  SpO2 98%  Vital signs normal    Physical Exam  Nursing note and vitals reviewed. Constitutional: He is oriented to person, place, and time. He appears well-developed and well-nourished.  Non-toxic appearance. He does not appear ill. No distress.  HENT:  Head: Normocephalic and atraumatic.  Right Ear: External ear normal.  Left Ear: External ear normal.  Nose: Nose normal. No mucosal edema or rhinorrhea.  Mouth/Throat: Oropharynx is clear and moist and mucous membranes are normal. No dental abscesses or uvula swelling.  Eyes:  Conjunctivae and EOM are normal. Pupils are equal, round, and reactive to light.  Neck: Normal range of motion and full passive range of motion without pain. Neck supple.  Cardiovascular: Normal rate, regular rhythm and normal heart sounds.  Exam reveals no gallop and no friction rub.   No murmur heard. Pulmonary/Chest: Effort normal. No respiratory distress. He has wheezes. He has no rhonchi. He has no rales. He exhibits no tenderness and no crepitus.  Initial breath had a wheeze and it cleared  Abdominal: Soft. Normal appearance and bowel sounds are normal. He exhibits no distension. There is no tenderness. There is no rebound and no guarding.  Musculoskeletal: Normal range of motion. He exhibits tenderness. He exhibits no edema.       Back:  Tender along inferior edge of scapula bilaterally Moves all extremities well.   Neurological: He is alert and oriented to person, place, and time. He has normal strength. No cranial nerve deficit.  Skin: Skin is warm, dry and intact. No rash noted. No erythema. No pallor.  Psychiatric: He has a normal mood and affect. His speech is normal and behavior is normal. His mood appears not anxious.    ED Course  Procedures (including critical care time)  Medications  ibuprofen (ADVIL,MOTRIN) tablet 800 mg (800 mg Oral Given 02/23/14 1502)  cyclobenzaprine (FLEXERIL) tablet 10 mg (10 mg Oral Given 02/23/14 1502)  predniSONE (DELTASONE) tablet 60 mg (60 mg Oral Given 02/23/14 1502)  ipratropium-albuterol (DUONEB) 0.5-2.5 (3) MG/3ML nebulizer solution 3 mL (3 mLs Nebulization Given 02/23/14 1527)  albuterol (PROVENTIL) (2.5 MG/3ML) 0.083% nebulizer solution 2.5 mg (2.5 mg Nebulization Given 02/23/14 1526)     DIAGNOSTIC STUDIES: Oxygen Saturation is 98% on room air, normal by my interpretation.    COORDINATION OF CARE: 2:49 PM-Discussed treatment plan which includes EKG, CXR, breathing treatment with pt at bedside and pt agreed to plan.   4:09 PM-After  breathing treatment pt states his breathing is somewhat improved and he declines further breathing treatment.  On exam he has coarse breath sounds.  Discussed treatment plan which includes antibiotics, prednisone, and symptomatic relief.   Results for orders placed during the hospital encounter of 02/23/14  CBG MONITORING, ED      Result Value Ref Range   Glucose-Capillary 105 (*) 70 - 99 mg/dL     Dg Chest 2 View  12/31/1094   CLINICAL DATA:  Shortness of breath  EXAM: CHEST  2 VIEW  COMPARISON:  12/02/2013  FINDINGS: Cardiomediastinal silhouette is stable. No acute infiltrate or pleural effusion. No pulmonary  edema. Mild degenerative changes thoracic spine.  IMPRESSION: No active disease.  Mild degenerative changes thoracic spine.   Electronically Signed   By: Natasha MeadLiviu  Pop M.D.   On: 02/23/2014 14:58      EKG Interpretation   Date/Time:  Sunday February 23 2014 14:30:44 EST Ventricular Rate:  96 PR Interval:  158 QRS Duration: 94 QT Interval:  352 QTC Calculation: 444 R Axis:   28 Text Interpretation:  Normal sinus rhythm Normal ECG When compared with  ECG of 02-Dec-2013 09:48, No significant change was found Since last  tracing rate faster Confirmed by Errik Mitchelle  MD-I, Diona Peregoy ([id number]) on 02/23/2014  3:46:59 PM      MDM   Final diagnoses:  Bronchitis with bronchospasm  Pain in scapula    Discharge Medication List as of 02/23/2014  4:18 PM    START taking these medications   Details  amoxicillin (AMOXIL) 500 MG capsule Take 1 capsule (500 mg total) by mouth 3 (three) times daily., Starting 02/23/2014, Until Discontinued, Print    cyclobenzaprine (FLEXERIL) 10 MG tablet Take 1 tablet (10 mg total) by mouth 3 (three) times daily as needed (muscle pain)., Starting 02/23/2014, Until Discontinued, Print    predniSONE (DELTASONE) 20 MG tablet Take 2 po qd x 4d then 1 po qd x 4d, Print        Plan discharge  Robert AlbeIva Kazim Corrales, MD, FACEP    I personally performed the services described in this  documentation, which was scribed in my presence. The recorded information has been reviewed and considered.  Robert AlbeIva Fitz Matsuo, MD, Armando GangFACEP   Robert GivensIva L Darrio Bade, MD 02/23/14 2127

## 2014-02-23 NOTE — ED Notes (Signed)
Pt reports productive cough at times, green in color. Also states right and left upper back pain, worse to the right "where my lungs are", per pt. Also states sob, pt is in no distress at this time.

## 2014-02-23 NOTE — ED Notes (Signed)
Pt had spacer in hand at d/c.

## 2014-02-23 NOTE — ED Notes (Signed)
Pt c/o SOB and upper back pain, hx PNA, +nausea and fever, denies vomiting

## 2014-02-23 NOTE — Discharge Instructions (Signed)
Heat over your sinuses for pain relief. Take ibuprofen 600 mg + acetaminophen 1000 mg 4 times a day for pain. Take claritin OTC for your sinus pain. Take mucinex DM OTC for your cough. Take the antibiotics and prednisone until gone. Watch your diet while taking the prednisone, it can make your blood sugar get higher. Use the inhaler for wheezing or shortness of breathe. Use ice and heat for the soreness of the muscles in your back. Take the flexeril for the muscle soreness.  Recheck if you aren't improving in the next week.

## 2014-10-05 ENCOUNTER — Encounter (HOSPITAL_COMMUNITY): Payer: Self-pay | Admitting: Emergency Medicine

## 2014-10-05 ENCOUNTER — Emergency Department (HOSPITAL_COMMUNITY)
Admission: EM | Admit: 2014-10-05 | Discharge: 2014-10-05 | Disposition: A | Discharge status: Home / Self Care | Payer: Self-pay | Attending: Emergency Medicine | Admitting: Emergency Medicine

## 2014-10-05 DIAGNOSIS — E119 Type 2 diabetes mellitus without complications: Secondary | ICD-10-CM | POA: Insufficient documentation

## 2014-10-05 DIAGNOSIS — Z8701 Personal history of pneumonia (recurrent): Secondary | ICD-10-CM | POA: Insufficient documentation

## 2014-10-05 DIAGNOSIS — R1111 Vomiting without nausea: Secondary | ICD-10-CM | POA: Insufficient documentation

## 2014-10-05 DIAGNOSIS — K219 Gastro-esophageal reflux disease without esophagitis: Secondary | ICD-10-CM | POA: Insufficient documentation

## 2014-10-05 DIAGNOSIS — I10 Essential (primary) hypertension: Secondary | ICD-10-CM | POA: Insufficient documentation

## 2014-10-05 DIAGNOSIS — Z79899 Other long term (current) drug therapy: Secondary | ICD-10-CM | POA: Insufficient documentation

## 2014-10-05 DIAGNOSIS — T7840XA Allergy, unspecified, initial encounter: Secondary | ICD-10-CM | POA: Insufficient documentation

## 2014-10-05 DIAGNOSIS — Z7982 Long term (current) use of aspirin: Secondary | ICD-10-CM | POA: Insufficient documentation

## 2014-10-05 DIAGNOSIS — Z720 Tobacco use: Secondary | ICD-10-CM | POA: Insufficient documentation

## 2014-10-05 LAB — CBC WITH DIFFERENTIAL/PLATELET
Basophils Absolute: 0 10*3/uL (ref 0.0–0.1)
Basophils Relative: 0 % (ref 0–1)
Eosinophils Absolute: 0.1 10*3/uL (ref 0.0–0.7)
Eosinophils Relative: 1 % (ref 0–5)
HCT: 40.5 % (ref 39.0–52.0)
HEMOGLOBIN: 14.3 g/dL (ref 13.0–17.0)
LYMPHS PCT: 31 % (ref 12–46)
Lymphs Abs: 3.2 10*3/uL (ref 0.7–4.0)
MCH: 31.4 pg (ref 26.0–34.0)
MCHC: 35.3 g/dL (ref 30.0–36.0)
MCV: 89 fL (ref 78.0–100.0)
MONOS PCT: 7 % (ref 3–12)
Monocytes Absolute: 0.7 10*3/uL (ref 0.1–1.0)
NEUTROS ABS: 6.1 10*3/uL (ref 1.7–7.7)
Neutrophils Relative %: 60 % (ref 43–77)
Platelets: 274 10*3/uL (ref 150–400)
RBC: 4.55 MIL/uL (ref 4.22–5.81)
RDW: 12.6 % (ref 11.5–15.5)
WBC: 10.2 10*3/uL (ref 4.0–10.5)

## 2014-10-05 LAB — BASIC METABOLIC PANEL
Anion gap: 17 — ABNORMAL HIGH (ref 5–15)
BUN: 17 mg/dL (ref 6–23)
CO2: 23 mEq/L (ref 19–32)
Calcium: 9.2 mg/dL (ref 8.4–10.5)
Chloride: 98 mEq/L (ref 96–112)
Creatinine, Ser: 1.09 mg/dL (ref 0.50–1.35)
GFR calc non Af Amer: 75 mL/min — ABNORMAL LOW (ref >90)
GFR, EST AFRICAN AMERICAN: 87 mL/min — AB (ref >90)
GLUCOSE: 222 mg/dL — AB (ref 70–99)
POTASSIUM: 3.6 meq/L — AB (ref 3.7–5.3)
SODIUM: 138 meq/L (ref 137–147)

## 2014-10-05 MED ORDER — SODIUM CHLORIDE 0.9 % IV SOLN
Freq: Once | INTRAVENOUS | Status: AC
  Administered 2014-10-05: 09:00:00 via INTRAVENOUS

## 2014-10-05 MED ORDER — METHYLPREDNISOLONE SODIUM SUCC 125 MG IJ SOLR
125.0000 mg | Freq: Once | INTRAMUSCULAR | Status: AC
  Administered 2014-10-05: 125 mg via INTRAVENOUS
  Filled 2014-10-05: qty 2

## 2014-10-05 MED ORDER — DIPHENHYDRAMINE HCL 50 MG/ML IJ SOLN
50.0000 mg | Freq: Once | INTRAMUSCULAR | Status: AC
  Administered 2014-10-05: 50 mg via INTRAVENOUS
  Filled 2014-10-05: qty 1

## 2014-10-05 MED ORDER — FAMOTIDINE IN NACL 20-0.9 MG/50ML-% IV SOLN
20.0000 mg | Freq: Once | INTRAVENOUS | Status: AC
  Administered 2014-10-05: 20 mg via INTRAVENOUS
  Filled 2014-10-05: qty 50

## 2014-10-05 MED ORDER — FAMOTIDINE 20 MG PO TABS: 20.0000 mg | ORAL_TABLET | Freq: Two times a day (BID) | ORAL | Status: DC

## 2014-10-05 MED ORDER — SODIUM CHLORIDE 0.9 % IV SOLN
Freq: Once | INTRAVENOUS | Status: AC
  Administered 2014-10-05: 10:00:00 via INTRAVENOUS

## 2014-10-05 NOTE — ED Notes (Addendum)
Pt reports woke up this am with hives,n/v. Generalized Hives. Pt denies any new self care products. Pt reports generalized weakness. Airway patent. nad noted. Pt reports took benadryl prior to arrival with minimal relief.no auditory wheezing heard in triage.

## 2014-10-05 NOTE — ED Provider Notes (Signed)
CSN: [id number]     Arrival date & time 10/05/14  [id number] History  This chart was scribed for Robert LennertJoseph L Danijela Vessey, MD by Littie Deedsichard Sun, ED Scribe. This patient was seen in room APA05/APA05 and the patient's care was started at 7:07 AM.     Chief Complaint  Patient presents with  . Urticaria      Patient is a 53 y.o. male presenting with urticaria. The history is provided by the patient. No language interpreter was used.  Urticaria This is a new problem. The current episode started 1 to 2 hours ago. The problem occurs constantly. The problem has not changed since onset.Pertinent negatives include no chest pain, no abdominal pain and no headaches. Nothing aggravates the symptoms.   HPI Comments: Robert Lyons is a 53 y.o. male who presents to the Emergency Department complaining of constant, itching urticaria in his abdomen, legs and arms that began this morning. He notes having associated vomiting and states that he is still in pain. Patient has tried calamine lotion and Benedryl to relieve symptoms. He denies having similar episodes previously.   Past Medical History  Diagnosis Date  . Hypertension   . GERD (gastroesophageal reflux disease)   . Pneumonia   . Diabetes mellitus    History reviewed. No pertinent past surgical history. Family History  Problem Relation Age of Onset  . Diabetes Mother   . Hypertension Mother    History  Substance Use Topics  . Smoking status: Current Every Day Smoker -- 0.50 packs/day    Types: Cigarettes  . Smokeless tobacco: Not on file  . Alcohol Use: No    Review of Systems  Constitutional: Negative for appetite change and fatigue.  HENT: Negative for congestion, ear discharge and sinus pressure.   Eyes: Negative for discharge.  Respiratory: Negative for cough.   Cardiovascular: Negative for chest pain.  Gastrointestinal: Positive for vomiting. Negative for abdominal pain and diarrhea.  Genitourinary: Negative for frequency and hematuria.   Musculoskeletal: Negative for back pain.  Skin: Positive for rash.  Neurological: Negative for seizures and headaches.  Psychiatric/Behavioral: Negative for hallucinations.      Allergies  Review of patient's allergies indicates no known allergies.  Home Medications   Prior to Admission medications   Medication Sig Start Date End Date Taking? Authorizing Provider  aspirin 325 MG tablet Take 325 mg by mouth daily.   Yes Historical Provider, MD  lisinopril-hydrochlorothiazide (PRINZIDE,ZESTORETIC) 10-12.5 MG per tablet Take 1 tablet by mouth daily. 12/02/13  Yes Vanetta MuldersScott Zackowski, MD  metFORMIN (GLUCOPHAGE) 1000 MG tablet Take 1 tablet (1,000 mg total) by mouth 2 (two) times daily. 12/02/13  Yes Vanetta MuldersScott Zackowski, MD  Omega-3 Fatty Acids (FISH OIL) 1000 MG CAPS Take 1 capsule by mouth 2 (two) times daily.   Yes Historical Provider, MD   BP 105/70  Pulse 80  Temp(Src) 97.8 F (36.6 C) (Oral)  Resp 18  Ht 5\' 11"  (1.803 m)  Wt 200 lb (90.719 kg)  BMI 27.91 kg/m2  SpO2 98% Physical Exam  Nursing note and vitals reviewed. Constitutional: He is oriented to person, place, and time. He appears well-developed.  HENT:  Head: Normocephalic.  Eyes: Conjunctivae and EOM are normal. No scleral icterus.  Neck: Neck supple. No thyromegaly present.  Cardiovascular: Normal rate and regular rhythm.  Exam reveals no gallop and no friction rub.   No murmur heard. Pulmonary/Chest: No stridor. He has no wheezes. He has no rales. He exhibits no tenderness.  Abdominal: He exhibits no  distension. There is no tenderness. There is no rebound.  Musculoskeletal: Normal range of motion. He exhibits no edema.  Lymphadenopathy:    He has no cervical adenopathy.  Neurological: He is oriented to person, place, and time. He exhibits normal muscle tone. Coordination normal.  Skin: Rash noted. No erythema.  Urticarial rash in abdomen, legs and arms  Psychiatric: He has a normal mood and affect. His behavior is  normal.    ED Course  Procedures  DIAGNOSTIC STUDIES: Oxygen Saturation is 96% on RA, adequate by my interpretation.    COORDINATION OF CARE: 7:09 AM-Discussed treatment plan which includes labs and medication with pt at bedside and pt agreed to plan.   Labs Review Labs Reviewed  BASIC METABOLIC PANEL - Abnormal; Notable for the following:    Potassium 3.6 (*)    Glucose, Bld 222 (*)    GFR calc non Af Amer 75 (*)    GFR calc Af Amer 87 (*)    Anion gap 17 (*)    All other components within normal limits  CBC WITH DIFFERENTIAL    Imaging Review No results found.   EKG Interpretation None      MDM   Final diagnoses:  None    The chart was scribed for me under my direct supervision.  I personally performed the history, physical, and medical decision making and all procedures in the evaluation of this patient.Robert Lyons.    Kavion Mancinas L Krisha Beegle, MD 10/05/14 1145

## 2014-10-05 NOTE — Discharge Instructions (Signed)
Follow up with your md this week for recheck.  Take benadryl 25 mg every 4 hours for rash or ithcing

## 2015-01-28 ENCOUNTER — Encounter (HOSPITAL_COMMUNITY): Payer: Self-pay | Admitting: *Deleted

## 2015-01-28 ENCOUNTER — Emergency Department (HOSPITAL_COMMUNITY)
Admission: EM | Admit: 2015-01-28 | Discharge: 2015-01-29 | Disposition: A | Discharge status: Home / Self Care | Payer: Self-pay | Attending: Emergency Medicine | Admitting: Emergency Medicine

## 2015-01-28 DIAGNOSIS — R1110 Vomiting, unspecified: Secondary | ICD-10-CM | POA: Insufficient documentation

## 2015-01-28 DIAGNOSIS — Z79899 Other long term (current) drug therapy: Secondary | ICD-10-CM | POA: Insufficient documentation

## 2015-01-28 DIAGNOSIS — G4489 Other headache syndrome: Secondary | ICD-10-CM

## 2015-01-28 DIAGNOSIS — Z8719 Personal history of other diseases of the digestive system: Secondary | ICD-10-CM | POA: Insufficient documentation

## 2015-01-28 DIAGNOSIS — I10 Essential (primary) hypertension: Secondary | ICD-10-CM | POA: Insufficient documentation

## 2015-01-28 DIAGNOSIS — G44009 Cluster headache syndrome, unspecified, not intractable: Secondary | ICD-10-CM | POA: Insufficient documentation

## 2015-01-28 DIAGNOSIS — Z8701 Personal history of pneumonia (recurrent): Secondary | ICD-10-CM | POA: Insufficient documentation

## 2015-01-28 DIAGNOSIS — Z7982 Long term (current) use of aspirin: Secondary | ICD-10-CM | POA: Insufficient documentation

## 2015-01-28 DIAGNOSIS — M542 Cervicalgia: Secondary | ICD-10-CM | POA: Insufficient documentation

## 2015-01-28 DIAGNOSIS — E119 Type 2 diabetes mellitus without complications: Secondary | ICD-10-CM | POA: Insufficient documentation

## 2015-01-28 LAB — CBG MONITORING, ED: GLUCOSE-CAPILLARY: 188 mg/dL — AB (ref 70–99)

## 2015-01-28 MED ORDER — METOCLOPRAMIDE HCL 5 MG/ML IJ SOLN
10.0000 mg | Freq: Once | INTRAMUSCULAR | Status: AC
  Administered 2015-01-29: 10 mg via INTRAVENOUS
  Filled 2015-01-28: qty 2

## 2015-01-28 MED ORDER — KETOROLAC TROMETHAMINE 30 MG/ML IJ SOLN
30.0000 mg | Freq: Once | INTRAMUSCULAR | Status: AC
  Administered 2015-01-29: 30 mg via INTRAVENOUS
  Filled 2015-01-28: qty 1

## 2015-01-28 NOTE — ED Provider Notes (Signed)
CSN: [id number]     Arrival date & time 01/28/15  2145 History  This chart was scribed for Joya Gaskins, MD by Tonye Royalty, ED Scribe. This patient was seen in room APA03/APA03 and the patient's care was started at 11:45 PM.    Chief Complaint  Patient presents with  . Headache   Patient is a 54 y.o. male presenting with headaches. The history is provided by the patient. No language interpreter was used.  Headache Pain location:  L parietal and L temporal (and left frontal) Quality:  Unable to specify Radiates to:  Does not radiate Onset quality:  Gradual Duration:  3 days Timing:  Intermittent Progression:  Worsening Chronicity:  Recurrent Similar to prior headaches: yes   Context: not activity   Relieved by:  None tried Worsened by:  Nothing tried Ineffective treatments:  None tried Associated symptoms: neck stiffness and vomiting   Associated symptoms: no abdominal pain     HPI Comments: HORATIO BERTZ is a 54 y.o. male who presents to the Emergency Department complaining of left -sided headache, persistent for the past 2-3 days with gradual onset, beginning while watching television. He notes that his left forehead struck an overhang years ago and states he has had similar headaches intermittently since then. He states he has recently been having them 1-2x a week. He denies recent injury. He reports associated vomiting, blurry vision, neck stiffness, He states he does not use any blood thinners. He notes history of HTN. He states he not seen a doctor for his headaches. He notes "tingling", and pain in his legs due to diabetes but this is not new. He denies new weakness in arms or legs, chest pain, abdominal pain  [facility]  Past Medical History  Diagnosis Date  . Hypertension   . GERD (gastroesophageal reflux disease)   . Pneumonia   . Diabetes mellitus    History reviewed. No pertinent past surgical history. Family History  Problem Relation Age of Onset  .  Diabetes Mother   . Hypertension Mother    History  Substance Use Topics  . Smoking status: Current Every Day Smoker -- 0.50 packs/day    Types: Cigarettes  . Smokeless tobacco: Not on file  . Alcohol Use: No    Review of Systems  Eyes: Positive for visual disturbance.  Cardiovascular: Negative for chest pain.  Gastrointestinal: Positive for vomiting. Negative for abdominal pain.  Musculoskeletal: Positive for neck stiffness.  Neurological: Positive for headaches. Negative for weakness.  Hematological: Does not bruise/bleed easily.  All other systems reviewed and are negative.     Allergies  Review of patient's allergies indicates no known allergies.  Home Medications   Prior to Admission medications   Medication Sig Start Date End Date Taking? Authorizing Provider  gabapentin (NEURONTIN) 100 MG capsule Take 100 mg by mouth 3 (three) times daily.   Yes Historical Provider, MD  aspirin 325 MG tablet Take 325 mg by mouth daily.    Historical Provider, MD  famotidine (PEPCID) 20 MG tablet Take 1 tablet (20 mg total) by mouth 2 (two) times daily. 10/05/14   Benny Lennert, MD  lisinopril-hydrochlorothiazide (PRINZIDE,ZESTORETIC) 10-12.5 MG per tablet Take 1 tablet by mouth daily. 12/02/13   Vanetta Mulders, MD  metFORMIN (GLUCOPHAGE) 1000 MG tablet Take 1 tablet (1,000 mg total) by mouth 2 (two) times daily. 12/02/13   Vanetta Mulders, MD  Omega-3 Fatty Acids (FISH OIL) 1000 MG CAPS Take 1 capsule by mouth 2 (two)  times daily.    Historical Provider, MD   BP 141/75 mmHg  Pulse 96  Temp(Src) 98.6 F (37 C) (Oral)  Resp 20  Ht 5\' 11"  (1.803 m)  Wt 196 lb (88.905 kg)  BMI 27.35 kg/m2  SpO2 100% Physical Exam  Nursing note and vitals reviewed. CONSTITUTIONAL: Well developed/well nourished.  Patient watching TV when I enter room HEAD: Normocephalic/atraumatic, no rash/erythema or signs of trauma to scalp EYES: EOMI/PERRL, no nystagmus, no ptosis ENMT: Mucous membranes  moist NECK: supple no meningeal signs, no bruits SPINE/BACK:entire spine nontender CV: S1/S2 noted, no murmurs/rubs/gallops noted LUNGS: Lungs are clear to auscultation bilaterally, no apparent distress ABDOMEN: soft, nontender, no rebound or guarding GU:no cva tenderness NEURO:Awake/alert, facies symmetric, no arm or leg drift is noted Equal 5/5 strength with shoulder abduction, elbow flex/extension, wrist flex/extension in upper extremities and equal hand grips bilaterally Equal 5/5 strength with hip flexion,knee flex/extension, foot dorsi/plantar flexion Cranial nerves 3/4/5/6/07/03/09/11/12 tested and intact Gait normal without ataxia (patient with slow gait due to pain in feet from chronic neuropathy) No past pointing Sensation to light touch intact in all extremities EXTREMITIES: pulses normal, full ROM SKIN: warm, color normal PSYCH: no abnormalities of mood noted, alert and oriented to situation   ED Course  Procedures   DIAGNOSTIC STUDIES: Oxygen Saturation is 100% on room air, normal by my interpretation.    COORDINATION OF CARE: 11:55 PM Discussed treatment plan with patient at beside, including pain control and follow up with specialist. The patient agrees with the plan and has no further questions at this time.   Pt improved at time of discharge He is resting comfortably He admits pain has been occurring intermittently for "awhile" and reports pain very similar to prior I don't feel emergent imaging/workup necessary at this time. I doubt acute CVA/SAH/meningitis. I advised that he needs close outpatient followup given his HA are recurring more frequently and disrupting his life.   We discussed strict return precautions  Labs Review Labs Reviewed  CBG MONITORING, ED - Abnormal; Notable for the following:    Glucose-Capillary 188 (*)    All other components within normal limits    Medications  metoCLOPramide (REGLAN) injection 10 mg (10 mg Intravenous Given 01/29/15  0005)  ketorolac (TORADOL) 30 MG/ML injection 30 mg (30 mg Intravenous Given 01/29/15 0005)     MDM   Final diagnoses:  Other headache syndrome    Nursing notes including past medical history and social history reviewed and considered in documentation Labs/vital reviewed myself and considered during evaluation    I personally performed the services described in this documentation, which was scribed in my presence. The recorded information has been reviewed and is accurate.      Joya Gaskinsonald W Inette Doubrava, MD 01/29/15 [phone]

## 2015-01-28 NOTE — ED Notes (Signed)
Pt reporting headache over left eye and onto left side of head.  States that he's had this off and on for past week.  Reports some light headedness, denies any nausea or vomiting.

## 2015-01-28 NOTE — ED Notes (Signed)
Patient states he hit his head "years ago" and since then he has been having head pain on the front left side of head. Patient states that the pain has been "going on for days" and is getting worse. No new injury, no deficits noted.

## 2015-01-29 NOTE — ED Notes (Signed)
Patient verbalizes understanding of discharge instructions, home care and follow up care. Patient ambulatory out of department at this time with family. 

## 2015-01-29 NOTE — Discharge Instructions (Signed)

## 2015-01-29 NOTE — ED Notes (Signed)
Patient resting in bed, eyes closed, even rise and fall of chest. NAD noted at this time. 

## 2015-05-16 ENCOUNTER — Encounter (HOSPITAL_COMMUNITY): Payer: Self-pay | Admitting: *Deleted

## 2015-05-16 ENCOUNTER — Emergency Department (HOSPITAL_COMMUNITY)
Admission: EM | Admit: 2015-05-16 | Discharge: 2015-05-17 | Disposition: A | Discharge status: Home / Self Care | Payer: Self-pay | Attending: Emergency Medicine | Admitting: Emergency Medicine

## 2015-05-16 ENCOUNTER — Emergency Department (HOSPITAL_COMMUNITY): Payer: Self-pay

## 2015-05-16 DIAGNOSIS — M25569 Pain in unspecified knee: Secondary | ICD-10-CM

## 2015-05-16 DIAGNOSIS — K219 Gastro-esophageal reflux disease without esophagitis: Secondary | ICD-10-CM | POA: Insufficient documentation

## 2015-05-16 DIAGNOSIS — I10 Essential (primary) hypertension: Secondary | ICD-10-CM | POA: Insufficient documentation

## 2015-05-16 DIAGNOSIS — Z79899 Other long term (current) drug therapy: Secondary | ICD-10-CM | POA: Insufficient documentation

## 2015-05-16 DIAGNOSIS — M25462 Effusion, left knee: Secondary | ICD-10-CM | POA: Insufficient documentation

## 2015-05-16 DIAGNOSIS — E119 Type 2 diabetes mellitus without complications: Secondary | ICD-10-CM | POA: Insufficient documentation

## 2015-05-16 DIAGNOSIS — Z7982 Long term (current) use of aspirin: Secondary | ICD-10-CM | POA: Insufficient documentation

## 2015-05-16 DIAGNOSIS — Z8701 Personal history of pneumonia (recurrent): Secondary | ICD-10-CM | POA: Insufficient documentation

## 2015-05-16 MED ORDER — HYDROCODONE-ACETAMINOPHEN 5-325 MG PO TABS
2.0000 | ORAL_TABLET | Freq: Once | ORAL | Status: AC
  Administered 2015-05-16: 2 via ORAL
  Filled 2015-05-16: qty 2

## 2015-05-16 MED ORDER — DEXAMETHASONE 4 MG PO TABS: 4.0000 mg | ORAL_TABLET | Freq: Two times a day (BID) | ORAL | Status: DC

## 2015-05-16 MED ORDER — TRAMADOL HCL 50 MG PO TABS: 50.0000 mg | ORAL_TABLET | Freq: Four times a day (QID) | ORAL | Status: DC | PRN

## 2015-05-16 MED ORDER — KETOROLAC TROMETHAMINE 60 MG/2ML IM SOLN
60.0000 mg | Freq: Once | INTRAMUSCULAR | Status: AC
  Administered 2015-05-16: 60 mg via INTRAMUSCULAR
  Filled 2015-05-16: qty 2

## 2015-05-16 MED ORDER — HYDROCODONE-ACETAMINOPHEN 5-325 MG PO TABS
1.0000 | ORAL_TABLET | Freq: Once | ORAL | Status: AC
  Administered 2015-05-16: 1 via ORAL
  Filled 2015-05-16: qty 1

## 2015-05-16 MED ORDER — DICLOFENAC SODIUM 75 MG PO TBEC: 75.0000 mg | DELAYED_RELEASE_TABLET | Freq: Two times a day (BID) | ORAL | Status: DC

## 2015-05-16 NOTE — Discharge Instructions (Signed)
You have fluid in the knee joint. There are also signs of arthritis on your examination. Heating pad may be helpful. Please use your knee immobilizer and crutches when up and about. Please see Dr. Hilda LiasKeeling, or the orthopedic specialist of your choice as soon as possible concerning this effusion of your knee.

## 2015-05-16 NOTE — ED Notes (Addendum)
Pt reporting pain and swelling in left knee, reports pain radiates into left lower leg.  Reports some pain in right knee as well, but left is worse.  Denies known injury.

## 2015-05-16 NOTE — ED Provider Notes (Signed)
CSN: [id number]     Arrival date & time 05/16/15  2033 History   First MD Initiated Contact with Patient 05/16/15 2142     Chief Complaint  Patient presents with  . Knee Pain     (Consider location/radiation/quality/duration/timing/severity/associated sxs/prior Treatment) Patient is a 54 y.o. male presenting with knee pain. The history is provided by the patient.  Knee Pain Location:  Knee Time since incident:  2 weeks Injury: no   Knee location:  L knee Pain details:    Quality:  Aching and throbbing   Severity:  Moderate   Onset quality:  Gradual   Duration:  2 weeks   Timing:  Intermittent   Progression:  Worsening Chronicity: acute on chronic. Dislocation: no   Relieved by:  Nothing Worsened by:  Bearing weight Ineffective treatments:  None tried Associated symptoms: back pain, decreased ROM, stiffness and swelling   Associated symptoms: no fever   Risk factors: no frequent fractures and no recent illness     Past Medical History  Diagnosis Date  . Hypertension   . GERD (gastroesophageal reflux disease)   . Pneumonia   . Diabetes mellitus    History reviewed. No pertinent past surgical history. Family History  Problem Relation Age of Onset  . Diabetes Mother   . Hypertension Mother    History  Substance Use Topics  . Smoking status: Current Every Day Smoker -- 1.00 packs/day    Types: Cigarettes  . Smokeless tobacco: Not on file  . Alcohol Use: No    Review of Systems  Constitutional: Negative for fever.  Musculoskeletal: Positive for back pain, arthralgias and stiffness.  All other systems reviewed and are negative.     Allergies  Review of patient's allergies indicates no known allergies.  Home Medications   Prior to Admission medications   Medication Sig Start Date End Date Taking? Authorizing Provider  aspirin 325 MG tablet Take 325 mg by mouth daily.   Yes Historical Provider, MD  famotidine (PEPCID) 20 MG tablet Take 1 tablet (20 mg  total) by mouth 2 (two) times daily. 10/05/14  Yes Bethann BerkshireJoseph Zammit, MD  gabapentin (NEURONTIN) 100 MG capsule Take 100 mg by mouth 3 (three) times daily.   Yes Historical Provider, MD  lisinopril-hydrochlorothiazide (PRINZIDE,ZESTORETIC) 10-12.5 MG per tablet Take 1 tablet by mouth daily. 12/02/13  Yes Vanetta MuldersScott Zackowski, MD  metFORMIN (GLUCOPHAGE) 1000 MG tablet Take 1 tablet (1,000 mg total) by mouth 2 (two) times daily. 12/02/13  Yes Vanetta MuldersScott Zackowski, MD  Omega-3 Fatty Acids (FISH OIL) 1000 MG CAPS Take 1 capsule by mouth 2 (two) times daily.   Yes Historical Provider, MD   BP 124/84 mmHg  Pulse 86  Temp(Src) 99 F (37.2 C) (Oral)  Ht 5\' 11"  (1.803 m)  Wt 195 lb (88.451 kg)  BMI 27.21 kg/m2  SpO2 100% Physical Exam  Constitutional: He is oriented to person, place, and time. He appears well-developed and well-nourished.  Non-toxic appearance.  HENT:  Head: Normocephalic.  Right Ear: Tympanic membrane and external ear normal.  Left Ear: Tympanic membrane and external ear normal.  Eyes: EOM and lids are normal. Pupils are equal, round, and reactive to light.  Neck: Normal range of motion. Neck supple. Carotid bruit is not present.  Cardiovascular: Normal rate, regular rhythm, normal heart sounds, intact distal pulses and normal pulses.   Pulmonary/Chest: Breath sounds normal. No respiratory distress.  Abdominal: Soft. Bowel sounds are normal. There is no tenderness. There is no guarding.  Musculoskeletal: Normal  range of motion.  The left knee is swollen and warm. There is tenderness at the quadricep area. There is tenderness of the posterior knee. No deformity of the patella tendon. No deformity of the tibial area.  Arthritis changes involving the hands and wrist.  Lymphadenopathy:       Head (right side): No submandibular adenopathy present.       Head (left side): No submandibular adenopathy present.    He has no cervical adenopathy.  Neurological: He is alert and oriented to person,  place, and time. He has normal strength. No cranial nerve deficit or sensory deficit.  Skin: Skin is warm and dry.  Psychiatric: He has a normal mood and affect. His speech is normal.  Nursing note and vitals reviewed.   ED Course  Procedures (including critical care time) Labs Review Labs Reviewed - No data to display  Imaging Review No results found.   EKG Interpretation None      MDM  Vital signs nonacute. Pulse oximetry is 100% on room air. Within normal limits by my interpretation.  Pain improved with intramuscular Toradol and oral Norco.   Vital signs are nonacute. Pulse oximetry is 100% on room air. Within normal limits by my interpretation. X-ray of the left knee reveals a small joint effusion, with the joint space being well maintained. No fracture or dislocation appreciated.   No evidence for septic joint appreciated. The patient will be referred to orthopedics. Prescription for diclofenac, Ultram, and Decadron given to the patient for his pain.    Final diagnoses:  Knee pain    *I have reviewed nursing notes, vital signs, and all appropriate lab and imaging results for this patient.**    Ivery Quale, PA-C 05/18/15 1136  Glynn Octave, MD 05/18/15 1410

## 2015-11-21 ENCOUNTER — Encounter (HOSPITAL_COMMUNITY): Payer: Self-pay

## 2015-11-21 ENCOUNTER — Emergency Department (HOSPITAL_COMMUNITY)
Admission: EM | Admit: 2015-11-21 | Discharge: 2015-11-21 | Disposition: A | Discharge status: Home / Self Care | Payer: Self-pay | Attending: Emergency Medicine | Admitting: Emergency Medicine

## 2015-11-21 DIAGNOSIS — K0889 Other specified disorders of teeth and supporting structures: Secondary | ICD-10-CM | POA: Insufficient documentation

## 2015-11-21 DIAGNOSIS — F17210 Nicotine dependence, cigarettes, uncomplicated: Secondary | ICD-10-CM | POA: Insufficient documentation

## 2015-11-21 DIAGNOSIS — X500XXA Overexertion from strenuous movement or load, initial encounter: Secondary | ICD-10-CM | POA: Insufficient documentation

## 2015-11-21 DIAGNOSIS — Z8701 Personal history of pneumonia (recurrent): Secondary | ICD-10-CM | POA: Insufficient documentation

## 2015-11-21 DIAGNOSIS — Z7952 Long term (current) use of systemic steroids: Secondary | ICD-10-CM | POA: Insufficient documentation

## 2015-11-21 DIAGNOSIS — K219 Gastro-esophageal reflux disease without esophagitis: Secondary | ICD-10-CM | POA: Insufficient documentation

## 2015-11-21 DIAGNOSIS — I10 Essential (primary) hypertension: Secondary | ICD-10-CM | POA: Insufficient documentation

## 2015-11-21 DIAGNOSIS — Y9289 Other specified places as the place of occurrence of the external cause: Secondary | ICD-10-CM | POA: Insufficient documentation

## 2015-11-21 DIAGNOSIS — Y9389 Activity, other specified: Secondary | ICD-10-CM | POA: Insufficient documentation

## 2015-11-21 DIAGNOSIS — E119 Type 2 diabetes mellitus without complications: Secondary | ICD-10-CM | POA: Insufficient documentation

## 2015-11-21 DIAGNOSIS — Z7982 Long term (current) use of aspirin: Secondary | ICD-10-CM | POA: Insufficient documentation

## 2015-11-21 DIAGNOSIS — Z791 Long term (current) use of non-steroidal anti-inflammatories (NSAID): Secondary | ICD-10-CM | POA: Insufficient documentation

## 2015-11-21 DIAGNOSIS — S199XXA Unspecified injury of neck, initial encounter: Secondary | ICD-10-CM | POA: Insufficient documentation

## 2015-11-21 DIAGNOSIS — Y998 Other external cause status: Secondary | ICD-10-CM | POA: Insufficient documentation

## 2015-11-21 DIAGNOSIS — S4991XA Unspecified injury of right shoulder and upper arm, initial encounter: Secondary | ICD-10-CM | POA: Insufficient documentation

## 2015-11-21 DIAGNOSIS — T148XXA Other injury of unspecified body region, initial encounter: Secondary | ICD-10-CM

## 2015-11-21 DIAGNOSIS — K029 Dental caries, unspecified: Secondary | ICD-10-CM | POA: Insufficient documentation

## 2015-11-21 DIAGNOSIS — Z79899 Other long term (current) drug therapy: Secondary | ICD-10-CM | POA: Insufficient documentation

## 2015-11-21 MED ORDER — TRAMADOL HCL 50 MG PO TABS
50.0000 mg | ORAL_TABLET | Freq: Four times a day (QID) | ORAL | Status: DC | PRN
Start: 2015-11-21 — End: 2016-01-19

## 2015-11-21 MED ORDER — IBUPROFEN 800 MG PO TABS
800.0000 mg | ORAL_TABLET | Freq: Once | ORAL | Status: AC
  Administered 2015-11-21: 800 mg via ORAL
  Filled 2015-11-21: qty 1

## 2015-11-21 MED ORDER — NAPROXEN 500 MG PO TABS: 500.0000 mg | ORAL_TABLET | Freq: Two times a day (BID) | ORAL | Status: DC

## 2015-11-21 MED ORDER — CLINDAMYCIN HCL 150 MG PO CAPS: 300.0000 mg | ORAL_CAPSULE | Freq: Four times a day (QID) | ORAL | Status: DC

## 2015-11-21 MED ORDER — CLINDAMYCIN HCL 150 MG PO CAPS
300.0000 mg | ORAL_CAPSULE | Freq: Once | ORAL | Status: AC
  Administered 2015-11-21: 300 mg via ORAL
  Filled 2015-11-21: qty 2

## 2015-11-21 NOTE — ED Notes (Signed)
Discharge papers given to pt,. Discussed presecriptions - and also when to take . Verbalized understanding, given dental list by PA

## 2015-11-21 NOTE — ED Provider Notes (Signed)
CSN: [id number]     Arrival date & time 11/21/15  1314 History   First MD Initiated Contact with Patient 11/21/15 1440     Chief Complaint  Patient presents with  . Dental Pain     (Consider location/radiation/quality/duration/timing/severity/associated sxs/prior Treatment) HPI   Robert Lyons is a 54 y.o. male who presents to the Emergency Department complaining of dental pain for one week .  He reports hx of "broken tooth" that occurred one month ago.  He has intermittent pain since then, but pain significantly increased for several days.  He reports having swelling of the upper lip, but he reports that it has improved.  He has been using war, epsom salt rinses.  He states that he doesn't have money to afford a dentist.  He denies fever, vomiting, difficulty swallowing.  He also complains of right posterior shoulder and neck pain for one week.  Pain is worse with movement of the right arm.  Denies injury, but states that he has to do a lot of heavy lifting at his job.     Past Medical History  Diagnosis Date  . Hypertension   . GERD (gastroesophageal reflux disease)   . Pneumonia   . Diabetes mellitus    History reviewed. No pertinent past surgical history. Family History  Problem Relation Age of Onset  . Diabetes Mother   . Hypertension Mother    Social History  Substance Use Topics  . Smoking status: Current Every Day Smoker -- 1.00 packs/day    Types: Cigarettes  . Smokeless tobacco: None  . Alcohol Use: No    Review of Systems  Constitutional: Negative for fever and appetite change.  HENT: Positive for dental problem. Negative for congestion, facial swelling, sore throat and trouble swallowing.   Eyes: Negative for pain and visual disturbance.  Gastrointestinal: Negative for vomiting.  Musculoskeletal: Positive for arthralgias (right shoulder pain). Negative for joint swelling, neck pain and neck stiffness.  Neurological: Negative for dizziness, facial asymmetry  and headaches.  Hematological: Negative for adenopathy.  All other systems reviewed and are negative.     Allergies  Review of patient's allergies indicates no known allergies.  Home Medications   Prior to Admission medications   Medication Sig Start Date End Date Taking? Authorizing Provider  aspirin 325 MG tablet Take 325 mg by mouth daily.    Historical Provider, MD  dexamethasone (DECADRON) 4 MG tablet Take 1 tablet (4 mg total) by mouth 2 (two) times daily with a meal. 05/16/15   Ivery Quale, PA-C  diclofenac (VOLTAREN) 75 MG EC tablet Take 1 tablet (75 mg total) by mouth 2 (two) times daily. 05/16/15   Ivery Quale, PA-C  famotidine (PEPCID) 20 MG tablet Take 1 tablet (20 mg total) by mouth 2 (two) times daily. 10/05/14   Bethann Berkshire, MD  gabapentin (NEURONTIN) 100 MG capsule Take 100 mg by mouth 3 (three) times daily.    Historical Provider, MD  lisinopril-hydrochlorothiazide (PRINZIDE,ZESTORETIC) 10-12.5 MG per tablet Take 1 tablet by mouth daily. 12/02/13   Vanetta Mulders, MD  metFORMIN (GLUCOPHAGE) 1000 MG tablet Take 1 tablet (1,000 mg total) by mouth 2 (two) times daily. 12/02/13   Vanetta Mulders, MD  Omega-3 Fatty Acids (FISH OIL) 1000 MG CAPS Take 1 capsule by mouth 2 (two) times daily.    Historical Provider, MD  traMADol (ULTRAM) 50 MG tablet Take 1 tablet (50 mg total) by mouth every 6 (six) hours as needed. 05/16/15   Ivery Quale, PA-C  BP 153/81 mmHg  Pulse 105  Temp(Src) 98.1 F (36.7 C) (Oral)  Resp 18  Ht 5\' 11"  (1.803 m)  Wt 88.451 kg  BMI 27.21 kg/m2  SpO2 99% Physical Exam  Constitutional: He is oriented to person, place, and time. He appears well-developed and well-nourished. No distress.  HENT:  Head: Normocephalic and atraumatic.  Right Ear: Tympanic membrane and ear canal normal.  Left Ear: Tympanic membrane and ear canal normal.  Mouth/Throat: Uvula is midline, oropharynx is clear and moist and mucous membranes are normal. No trismus in the  jaw. Dental caries present. No dental abscesses or uvula swelling.  Tenderness to palp with dental caries of the left central incisor.  No facial swelling, obvious dental abscess, trismus, or sublingual abnml.    Neck: Normal range of motion. Neck supple.  Cardiovascular: Normal rate, regular rhythm and normal heart sounds.   No murmur heard. Pulmonary/Chest: Effort normal and breath sounds normal.  Musculoskeletal: Normal range of motion. He exhibits tenderness.  Tenderness to palpation along the right scapular border and trapezius muscle. No spinal tenderness no tenderness erythema or edema of the right shoulder joint.  Patient has full range of motion of the right shoulder  Lymphadenopathy:    He has no cervical adenopathy.  Neurological: He is alert and oriented to person, place, and time. He exhibits normal muscle tone. Coordination normal.  Skin: Skin is warm and dry.  Nursing note and vitals reviewed.   ED Course  Procedures (including critical care time)   MDM   Final diagnoses:  Pain, dental  Muscle strain    Patient is well-appearing. His ambulated in the department without difficulty. Airway is patent. No facial edema noted. No concerning symptoms for Lugwig's angina  Given referral info for local dentistry. Posterior shoulder pain is likely musculoskeletal w/o concerning sx's for septic joint    Pauline Ausammy Luxe Cuadros, PA-C 11/22/15 1253  Doug SouSam Jacubowitz, MD 11/22/15 1702

## 2015-11-21 NOTE — ED Notes (Signed)
Patient states he has a broken tooth on the upper right, has had pain times one week which has gotten progressively worsen, pain now goes "down neck and up head".

## 2016-01-19 ENCOUNTER — Encounter (HOSPITAL_COMMUNITY): Payer: Self-pay | Admitting: *Deleted

## 2016-01-19 ENCOUNTER — Emergency Department (HOSPITAL_COMMUNITY)
Admission: EM | Admit: 2016-01-19 | Discharge: 2016-01-19 | Disposition: A | Discharge status: Home / Self Care | Payer: Self-pay | Attending: Emergency Medicine | Admitting: Emergency Medicine

## 2016-01-19 DIAGNOSIS — F17210 Nicotine dependence, cigarettes, uncomplicated: Secondary | ICD-10-CM | POA: Insufficient documentation

## 2016-01-19 DIAGNOSIS — Z8701 Personal history of pneumonia (recurrent): Secondary | ICD-10-CM | POA: Insufficient documentation

## 2016-01-19 DIAGNOSIS — Z79899 Other long term (current) drug therapy: Secondary | ICD-10-CM | POA: Insufficient documentation

## 2016-01-19 DIAGNOSIS — Z7984 Long term (current) use of oral hypoglycemic drugs: Secondary | ICD-10-CM | POA: Insufficient documentation

## 2016-01-19 DIAGNOSIS — M1712 Unilateral primary osteoarthritis, left knee: Secondary | ICD-10-CM | POA: Insufficient documentation

## 2016-01-19 DIAGNOSIS — E119 Type 2 diabetes mellitus without complications: Secondary | ICD-10-CM | POA: Insufficient documentation

## 2016-01-19 DIAGNOSIS — K219 Gastro-esophageal reflux disease without esophagitis: Secondary | ICD-10-CM | POA: Insufficient documentation

## 2016-01-19 DIAGNOSIS — Z7982 Long term (current) use of aspirin: Secondary | ICD-10-CM | POA: Insufficient documentation

## 2016-01-19 DIAGNOSIS — M25462 Effusion, left knee: Secondary | ICD-10-CM | POA: Insufficient documentation

## 2016-01-19 DIAGNOSIS — I10 Essential (primary) hypertension: Secondary | ICD-10-CM | POA: Insufficient documentation

## 2016-01-19 MED ORDER — HYDROCODONE-ACETAMINOPHEN 5-325 MG PO TABS
2.0000 | ORAL_TABLET | Freq: Once | ORAL | Status: AC
  Administered 2016-01-19: 2 via ORAL
  Filled 2016-01-19: qty 2

## 2016-01-19 MED ORDER — DEXAMETHASONE SODIUM PHOSPHATE 4 MG/ML IJ SOLN
8.0000 mg | Freq: Once | INTRAMUSCULAR | Status: AC
Start: 2016-01-19 — End: 2016-01-19
  Administered 2016-01-19: 8 mg via INTRAMUSCULAR
  Filled 2016-01-19: qty 2

## 2016-01-19 MED ORDER — METFORMIN HCL 1000 MG PO TABS: 1000.0000 mg | ORAL_TABLET | Freq: Two times a day (BID) | ORAL | Status: DC

## 2016-01-19 MED ORDER — DICLOFENAC SODIUM 75 MG PO TBEC: 75.0000 mg | DELAYED_RELEASE_TABLET | Freq: Two times a day (BID) | ORAL | Status: DC

## 2016-01-19 MED ORDER — ONDANSETRON HCL 4 MG PO TABS
4.0000 mg | ORAL_TABLET | Freq: Once | ORAL | Status: AC
Start: 2016-01-19 — End: 2016-01-19
  Administered 2016-01-19: 4 mg via ORAL
  Filled 2016-01-19: qty 1

## 2016-01-19 MED ORDER — KETOROLAC TROMETHAMINE 60 MG/2ML IM SOLN
60.0000 mg | Freq: Once | INTRAMUSCULAR | Status: AC
  Administered 2016-01-19: 60 mg via INTRAMUSCULAR
  Filled 2016-01-19: qty 2

## 2016-01-19 MED ORDER — TRAMADOL HCL 50 MG PO TABS: ORAL_TABLET | ORAL | Status: DC

## 2016-01-19 NOTE — Care Management (Signed)
CM asked to see pt for med assistance, PCP and f/u care. Pt has no insurance and may no longer qualifies for services through the free clinic. Discussed the price of pt's Rx and pt verbalizes he can afford these costs OOP. Pt given info for [facility] including application and list of items to bring. Pt instructed to call and make appointment. Pt need f/u with orthopedist, will discuss pt info with FC to determine is pt should be contacted to complete application for financial assistance. No further CM needs.

## 2016-01-19 NOTE — ED Notes (Signed)
Called Case Management as requested by H.Bryant,PA., to see Pt for help with Diabetic Medications.

## 2016-01-19 NOTE — Discharge Instructions (Signed)
Please take diclofenac with food. Ultram may cause drowsiness, please use this medication with caution. Please see the orthopedic specialist if the swelling in the knee is not improving with the prescribed medications. Knee Effusion Knee effusion means that you have excess fluid in your knee joint. This can cause pain and swelling in your knee. This may make your knee more difficult to bend and move. That is because there is increased pain and pressure in the joint. If there is fluid in your knee, it often means that something is wrong inside your knee, such as severe arthritis, abnormal inflammation, or an infection. Another common cause of knee effusion is an injury to the knee muscles, ligaments, or cartilage. HOME CARE INSTRUCTIONS  Use crutches as directed by your health care provider.  Wear a knee brace as directed by your health care provider.  Apply ice to the swollen area:  Put ice in a plastic bag.  Place a towel between your skin and the bag.  Leave the ice on for 20 minutes, 2-3 times per day.  Keep your knee raised (elevated) when you are sitting or lying down.  Take medicines only as directed by your health care provider.  Do any rehabilitation or strengthening exercises as directed by your health care provider.  Rest your knee as directed by your health care provider. You may start doing your normal activities again when your health care provider approves.   Keep all follow-up visits as directed by your health care provider. This is important. SEEK MEDICAL CARE IF:  You have ongoing (persistent) pain in your knee. SEEK IMMEDIATE MEDICAL CARE IF:  You have increased swelling or redness of your knee.  You have severe pain in your knee.  You have a fever.   This information is not intended to replace advice given to you by your health care provider. Make sure you discuss any questions you have with your health care provider.   Document Released: 03/03/2004  Document Revised: 01/02/2015 Document Reviewed: 07/28/2014 Elsevier Interactive Patient Education [employer]

## 2016-01-19 NOTE — ED Provider Notes (Signed)
CSN: [id number]     Arrival date & time 01/19/16  1100 History   First MD Initiated Contact with Patient 01/19/16 1149     Chief Complaint  Patient presents with  . Knee Pain     (Consider location/radiation/quality/duration/timing/severity/associated sxs/prior Treatment) HPI Comments: Patient is a 55 year old male who presents to the emergency department with a complaint of left knee pain.  The patient has a history of osteoarthritis, diabetes mellitus, and hypertension. He states that he has had problems with his knee for "a long time". He further states that he has been told that he needed orthopedic evaluation of this, but he does not have insurance, and cannot afford financially to see an orthopedist at this time. The patient states that he has swelling that comes and goes involving the knee depending on the level of activity. Recently this has been more severe, especially in the recent bad weather. The patient also complains of a burning sensation from the knee down to the foot, and at times states it feels as though he has a sock on when he does not have a sock on, or that there is something under his foot. No recent fever or chills reported. No sores or ulcers involving the right or left lower extremity.  The history is provided by the patient.    Past Medical History  Diagnosis Date  . Hypertension   . GERD (gastroesophageal reflux disease)   . Pneumonia   . Diabetes mellitus    History reviewed. No pertinent past surgical history. Family History  Problem Relation Age of Onset  . Diabetes Mother   . Hypertension Mother    Social History  Substance Use Topics  . Smoking status: Current Every Day Smoker -- 1.00 packs/day    Types: Cigarettes  . Smokeless tobacco: None  . Alcohol Use: No    Review of Systems  Constitutional: Negative for fever, chills, activity change and appetite change.  Musculoskeletal: Positive for arthralgias.  All other systems reviewed and are  negative.     Allergies  Review of patient's allergies indicates no known allergies.  Home Medications   Prior to Admission medications   Medication Sig Start Date End Date Taking? Authorizing Provider  aspirin 325 MG tablet Take 325 mg by mouth daily.   Yes Historical Provider, MD  famotidine (PEPCID) 20 MG tablet Take 1 tablet (20 mg total) by mouth 2 (two) times daily. 10/05/14  Yes Bethann Berkshire, MD  gabapentin (NEURONTIN) 100 MG capsule Take 100 mg by mouth 3 (three) times daily.   Yes Historical Provider, MD  ibuprofen (ADVIL,MOTRIN) 200 MG tablet Take 400 mg by mouth at bedtime as needed (knee pain).   Yes Historical Provider, MD  lisinopril-hydrochlorothiazide (PRINZIDE,ZESTORETIC) 10-12.5 MG per tablet Take 1 tablet by mouth daily. 12/02/13  Yes Vanetta Mulders, MD  metFORMIN (GLUCOPHAGE) 1000 MG tablet Take 1 tablet (1,000 mg total) by mouth 2 (two) times daily. 12/02/13  Yes Vanetta Mulders, MD  clindamycin (CLEOCIN) 150 MG capsule Take 2 capsules (300 mg total) by mouth 4 (four) times daily. For 7 days Patient not taking: Reported on 01/19/2016 11/21/15   Tammy Triplett, PA-C  naproxen (NAPROSYN) 500 MG tablet Take 1 tablet (500 mg total) by mouth 2 (two) times daily with a meal. Patient not taking: Reported on 01/19/2016 11/21/15   Tammy Triplett, PA-C  traMADol (ULTRAM) 50 MG tablet Take 1 tablet (50 mg total) by mouth every 6 (six) hours as needed. Patient not taking: Reported on 01/19/2016  11/21/15   Tammy Triplett, PA-C   BP 161/76 mmHg  Pulse 93  Temp(Src) 98.8 F (37.1 C) (Oral)  Resp 16  Ht  (1.803 m)  Wt 88.451 kg  BMI 27.21 kg/m2  SpO2 97% Physical Exam  Constitutional: He is oriented to person, place, and time. He appears well-developed and well-nourished.  Non-toxic appearance.  HENT:  Head: Normocephalic.  Right Ear: Tympanic membrane and external ear normal.  Left Ear: Tympanic membrane and external ear normal.  Eyes: EOM and lids are normal. Pupils  are equal, round, and reactive to light.  Neck: Normal range of motion. Neck supple. Carotid bruit is not present.  Cardiovascular: Normal rate, regular rhythm, normal heart sounds, intact distal pulses and normal pulses.   Pulmonary/Chest: Breath sounds normal. No respiratory distress. He has no wheezes.  Abdominal: Soft. Bowel sounds are normal. There is no tenderness. There is no guarding.  Musculoskeletal: Normal range of motion.  There is good range of motion of the left hip, with only minimal soreness with range of motion. There is soreness and some swelling in the quadricep area extending down to the patella area. There is a mild-to-moderate effusion present. The patella is in the midline. There is no posterior mass appreciated. The anterior tibial tuberosity is sore, but not hot. There is no edema of the right or left lower extremity. The dorsalis pedis pulses 2+, the posterior tibial pulse is 2+.  Lymphadenopathy:       Head (right side): No submandibular adenopathy present.       Head (left side): No submandibular adenopathy present.    He has no cervical adenopathy.  Neurological: He is alert and oriented to person, place, and time. He has normal strength. No cranial nerve deficit or sensory deficit.  Patient has good motor and sensory exam. He states it feels like he has a covering or sock on over his lower extremities when touched or when pain is tested.  Skin: Skin is warm and dry.  Psychiatric: He has a normal mood and affect. His speech is normal.  Nursing note and vitals reviewed.   ED Course  Procedures (including critical care time) Labs Review Labs Reviewed - No data to display  Imaging Review No results found. I have personally reviewed and evaluated these images and lab results as part of my medical decision-making.   EKG Interpretation None      MDM  The examination favors effusion of the left knee, probably related to osteoarthritis. The patient states he has  "no cartilage" of the left knee. The patient will be treated with diclofenac and Ultram. The patient states he's been told he can no longer attending the free clinic, because he does not qualify. I have asked the social worker to speak with the patient and has given him resource information concerning [facility]. Prescription for Glucophage 1000 mg twice a day is given to the patient, as he is out of his diabetic medication at this time.    Final diagnoses:  None    **I have reviewed nursing notes, vital signs, and all appropriate lab and imaging results for this patient.Ivery Quale, PA-C 01/19/16 1217  Bethann Berkshire, MD 01/19/16 [phone]

## 2016-01-19 NOTE — ED Notes (Signed)
Pt c/o left knee pain for "awhile", states it is progressively getting worse. Reports "no cartilage" in that knee. Also reports being out of his meds for his diabetes.

## 2016-10-28 ENCOUNTER — Emergency Department (HOSPITAL_COMMUNITY)
Admission: EM | Admit: 2016-10-28 | Discharge: 2016-10-28 | Disposition: A | Discharge status: Home / Self Care | Payer: Self-pay | Attending: Emergency Medicine | Admitting: Emergency Medicine

## 2016-10-28 ENCOUNTER — Encounter (HOSPITAL_COMMUNITY): Payer: Self-pay

## 2016-10-28 DIAGNOSIS — F17210 Nicotine dependence, cigarettes, uncomplicated: Secondary | ICD-10-CM | POA: Insufficient documentation

## 2016-10-28 DIAGNOSIS — Z7982 Long term (current) use of aspirin: Secondary | ICD-10-CM | POA: Insufficient documentation

## 2016-10-28 DIAGNOSIS — S39012A Strain of muscle, fascia and tendon of lower back, initial encounter: Secondary | ICD-10-CM | POA: Insufficient documentation

## 2016-10-28 DIAGNOSIS — Y929 Unspecified place or not applicable: Secondary | ICD-10-CM | POA: Insufficient documentation

## 2016-10-28 DIAGNOSIS — Z7984 Long term (current) use of oral hypoglycemic drugs: Secondary | ICD-10-CM | POA: Insufficient documentation

## 2016-10-28 DIAGNOSIS — Y999 Unspecified external cause status: Secondary | ICD-10-CM | POA: Insufficient documentation

## 2016-10-28 DIAGNOSIS — Z79899 Other long term (current) drug therapy: Secondary | ICD-10-CM | POA: Insufficient documentation

## 2016-10-28 DIAGNOSIS — E119 Type 2 diabetes mellitus without complications: Secondary | ICD-10-CM | POA: Insufficient documentation

## 2016-10-28 DIAGNOSIS — Y939 Activity, unspecified: Secondary | ICD-10-CM | POA: Insufficient documentation

## 2016-10-28 DIAGNOSIS — X500XXA Overexertion from strenuous movement or load, initial encounter: Secondary | ICD-10-CM | POA: Insufficient documentation

## 2016-10-28 DIAGNOSIS — I10 Essential (primary) hypertension: Secondary | ICD-10-CM | POA: Insufficient documentation

## 2016-10-28 MED ORDER — KETOROLAC TROMETHAMINE 60 MG/2ML IM SOLN
30.0000 mg | Freq: Once | INTRAMUSCULAR | Status: AC
  Administered 2016-10-28: 30 mg via INTRAMUSCULAR
  Filled 2016-10-28: qty 2

## 2016-10-28 MED ORDER — DIAZEPAM 5 MG PO TABS: 5.0000 mg | ORAL_TABLET | Freq: Two times a day (BID) | ORAL | 0 refills | Status: DC

## 2016-10-28 MED ORDER — HYDROCODONE-ACETAMINOPHEN 5-325 MG PO TABS
1.0000 | ORAL_TABLET | ORAL | 0 refills | Status: DC | PRN
Start: 2016-10-28 — End: 2017-07-10

## 2016-10-28 MED ORDER — IBUPROFEN 600 MG PO TABS: 600.0000 mg | ORAL_TABLET | Freq: Four times a day (QID) | ORAL | 0 refills | Status: DC | PRN

## 2016-10-28 NOTE — ED Provider Notes (Signed)
[facility] Provider Note   CSN: [id number] Arrival date & time: 10/28/16  1905     History   Chief Complaint Chief Complaint  Patient presents with  . Back Pain    HPI Robert Lyons is a 55 y.o. male.  Patient is a 55 year old male with history of diabetes, hypertension and GERD who presents the ED with complaints of low back pain, onset 3 days. Patient reports 3 days ago while he was trying to lift a piano, he began having constant sharp lower back pain. He reports the pain is worse with movement. Patient reports pain radiates down the left side of his leg intermittently. Denies history of back pain or prior injuries. Patient reports taking Motrin at home without relief. Pt denies fever, numbness, tingling, saddle anesthesia, loss of bowel or bladder, weakness, chest pain, SOB, abdominal pain, vomiting, urinary retention, IVDU, cancer or recent spinal manipulation.       Past Medical History:  Diagnosis Date  . Diabetes mellitus   . GERD (gastroesophageal reflux disease)   . Hypertension   . Pneumonia     There are no active problems to display for this patient.   History reviewed. No pertinent surgical history.     Home Medications    Prior to Admission medications   Medication Sig Start Date End Date Taking? Authorizing Provider  aspirin 325 MG tablet Take 325 mg by mouth daily.    Historical Provider, MD  diazepam (VALIUM) 5 MG tablet Take 1 tablet (5 mg total) by mouth 2 (two) times daily. 10/28/16   Barrett HenleNicole Elizabeth Carle Fenech, PA-C  diclofenac (VOLTAREN) 75 MG EC tablet Take 1 tablet (75 mg total) by mouth 2 (two) times daily. 01/19/16   Ivery QualeHobson Bryant, PA-C  famotidine (PEPCID) 20 MG tablet Take 1 tablet (20 mg total) by mouth 2 (two) times daily. 10/05/14   Bethann BerkshireJoseph Zammit, MD  gabapentin (NEURONTIN) 100 MG capsule Take 100 mg by mouth 3 (three) times daily.    Historical Provider, MD  HYDROcodone-acetaminophen (NORCO/VICODIN) 5-325 MG tablet Take 1  tablet by mouth every 4 (four) hours as needed. 10/28/16   Barrett HenleNicole Elizabeth Deboraha Goar, PA-C  ibuprofen (ADVIL,MOTRIN) 600 MG tablet Take 1 tablet (600 mg total) by mouth every 6 (six) hours as needed. 10/28/16   Barrett HenleNicole Elizabeth Dim Meisinger, PA-C  lisinopril-hydrochlorothiazide (PRINZIDE,ZESTORETIC) 10-12.5 MG per tablet Take 1 tablet by mouth daily. 12/02/13   Vanetta MuldersScott Zackowski, MD  metFORMIN (GLUCOPHAGE) 1000 MG tablet Take 1 tablet (1,000 mg total) by mouth 2 (two) times daily. 01/19/16   Ivery QualeHobson Bryant, PA-C  traMADol Janean Sark(ULTRAM) 50 MG tablet 1 or 2 po q6h prn pain 01/19/16   Ivery QualeHobson Bryant, PA-C    Family History Family History  Problem Relation Age of Onset  . Diabetes Mother   . Hypertension Mother     Social History Social History  Substance Use Topics  . Smoking status: Current Every Day Smoker    Packs/day: 1.00    Types: Cigarettes  . Smokeless tobacco: Never Used  . Alcohol use No     Allergies   Review of patient's allergies indicates no known allergies.   Review of Systems Review of Systems  Musculoskeletal: Positive for back pain.  All other systems reviewed and are negative.    Physical Exam Updated Vital Signs BP 110/55 (BP Location: Left Arm)   Pulse 77   Temp 97.9 F (36.6 C) (Oral)   Resp 16   Ht 5\' 11"  (1.803 m)   Wt 88.5  kg   SpO2 100%   BMI 27.20 kg/m   Physical Exam  Constitutional: He is oriented to person, place, and time. He appears well-developed and well-nourished. No distress.  HENT:  Head: Normocephalic and atraumatic.  Mouth/Throat: Oropharynx is clear and moist. No oropharyngeal exudate.  Eyes: Conjunctivae and EOM are normal. Right eye exhibits no discharge. Left eye exhibits no discharge. No scleral icterus.  Neck: Normal range of motion. Neck supple.  Cardiovascular: Normal rate, regular rhythm, normal heart sounds and intact distal pulses.   Pulmonary/Chest: Effort normal and breath sounds normal. No respiratory distress. He has no wheezes. He  has no rales. He exhibits no tenderness.  Abdominal: Soft. Bowel sounds are normal. He exhibits no distension and no mass. There is no tenderness. There is no rebound and no guarding. No hernia.  Musculoskeletal: Normal range of motion. He exhibits tenderness. He exhibits no edema or deformity.  No midline C, T, or L tenderness. TTP over bilateral lumbar paraspinal muscles with palpable spasm noted on the left side. Full range of motion of neck and back. Full range of motion of bilateral upper and lower extremities, with 5/5 strength however pt reports pain with movement of bilateral legs. Sensation intact. 2+ radial and PT pulses. Cap refill <2 seconds. Patient able to stand and ambulate without assistance but endorses pain.    Neurological: He is alert and oriented to person, place, and time. He has normal strength and normal reflexes. No sensory deficit.  Skin: Skin is warm and dry. He is not diaphoretic.  Nursing note and vitals reviewed.    ED Treatments / Results  Labs (all labs ordered are listed, but only abnormal results are displayed) Labs Reviewed - No data to display  EKG  EKG Interpretation None       Radiology No results found.  Procedures Procedures (including critical care time)  Medications Ordered in ED Medications  ketorolac (TORADOL) injection 30 mg (30 mg Intramuscular Given 10/28/16 2001)     Initial Impression / Assessment and Plan / ED Course  I have reviewed the triage vital signs and the nursing notes.  Pertinent labs & imaging results that were available during my care of the patient were reviewed by me and considered in my medical decision making (see chart for details).  Clinical Course    Patient with back pain after lifting piano 3 days ago.  No neurological deficits and normal neuro exam.  Patient can walk but states is painful.  No loss of bowel or bladder control.  No concern for cauda equina.  No fever, night sweats, weight loss, h/o cancer,  IVDU.  Suspect patient's symptoms are likely due to muscle strain/spasm associated with recent injury. Plan to discharge patient home with RICE protocol, muscle relaxant and pain medicine. Patient given information to follow up with PCP this pain has not improved over the next week. Discussed return precautions.  Final Clinical Impressions(s) / ED Diagnoses   Final diagnoses:  Strain of lumbar region, initial encounter    New Prescriptions New Prescriptions   DIAZEPAM (VALIUM) 5 MG TABLET    Take 1 tablet (5 mg total) by mouth 2 (two) times daily.   HYDROCODONE-ACETAMINOPHEN (NORCO/VICODIN) 5-325 MG TABLET    Take 1 tablet by mouth every 4 (four) hours as needed.   IBUPROFEN (ADVIL,MOTRIN) 600 MG TABLET    Take 1 tablet (600 mg total) by mouth every 6 (six) hours as needed.     Satira Sarkicole Elizabeth CornleaNadeau, [place] JerseyPA-C 10/28/16 2005  Bethann Berkshire, MD 10/31/16 [phone]

## 2016-10-28 NOTE — Discharge Instructions (Signed)
Take your medications as prescribed as needed for pain relief. I also recommend applying heat to her lower back for 15 minutes 3-4 times daily to help with her muscle spasms. I recommend refraining from doing any heavy lifting or repetitive/straining movements that exacerbate her symptoms for the next week until your pain has improved. Please follow up with a primary care provider from the Resource Guide provided below in one week if your symptoms have not improved. Please return to the Emergency Department if symptoms worsen or new onset of fever, numbness, tingling, saddle anesthesia, loss of bowel or bladder, urinary retention, chest pain, difficulty breathing abdominal pain, weakness.

## 2016-10-28 NOTE — ED Triage Notes (Signed)
Reports of picking up piano slate Wednesday and now has lower back pain radiates down left leg.

## 2016-11-05 ENCOUNTER — Emergency Department (HOSPITAL_COMMUNITY): Payer: Self-pay

## 2016-11-05 ENCOUNTER — Emergency Department (HOSPITAL_COMMUNITY)
Admission: EM | Admit: 2016-11-05 | Discharge: 2016-11-05 | Disposition: A | Discharge status: Home / Self Care | Payer: Self-pay | Attending: Emergency Medicine | Admitting: Emergency Medicine

## 2016-11-05 ENCOUNTER — Encounter (HOSPITAL_COMMUNITY): Payer: Self-pay | Admitting: Emergency Medicine

## 2016-11-05 DIAGNOSIS — E119 Type 2 diabetes mellitus without complications: Secondary | ICD-10-CM | POA: Insufficient documentation

## 2016-11-05 DIAGNOSIS — Z7982 Long term (current) use of aspirin: Secondary | ICD-10-CM | POA: Insufficient documentation

## 2016-11-05 DIAGNOSIS — I10 Essential (primary) hypertension: Secondary | ICD-10-CM | POA: Insufficient documentation

## 2016-11-05 DIAGNOSIS — Z791 Long term (current) use of non-steroidal anti-inflammatories (NSAID): Secondary | ICD-10-CM | POA: Insufficient documentation

## 2016-11-05 DIAGNOSIS — Y93F2 Activity, caregiving, lifting: Secondary | ICD-10-CM | POA: Insufficient documentation

## 2016-11-05 DIAGNOSIS — Z7984 Long term (current) use of oral hypoglycemic drugs: Secondary | ICD-10-CM | POA: Insufficient documentation

## 2016-11-05 DIAGNOSIS — Y929 Unspecified place or not applicable: Secondary | ICD-10-CM | POA: Insufficient documentation

## 2016-11-05 DIAGNOSIS — M545 Low back pain, unspecified: Secondary | ICD-10-CM

## 2016-11-05 DIAGNOSIS — X500XXA Overexertion from strenuous movement or load, initial encounter: Secondary | ICD-10-CM | POA: Insufficient documentation

## 2016-11-05 DIAGNOSIS — Y999 Unspecified external cause status: Secondary | ICD-10-CM | POA: Insufficient documentation

## 2016-11-05 DIAGNOSIS — F17210 Nicotine dependence, cigarettes, uncomplicated: Secondary | ICD-10-CM | POA: Insufficient documentation

## 2016-11-05 DIAGNOSIS — Z79899 Other long term (current) drug therapy: Secondary | ICD-10-CM | POA: Insufficient documentation

## 2016-11-05 LAB — CBG MONITORING, ED: GLUCOSE-CAPILLARY: 119 mg/dL — AB (ref 65–99)

## 2016-11-05 MED ORDER — DICLOFENAC SODIUM 50 MG PO TBEC: 50.0000 mg | DELAYED_RELEASE_TABLET | Freq: Two times a day (BID) | ORAL | 0 refills | Status: DC

## 2016-11-05 MED ORDER — METHOCARBAMOL 500 MG PO TABS: 500.0000 mg | ORAL_TABLET | Freq: Two times a day (BID) | ORAL | 0 refills | Status: DC

## 2016-11-05 MED ORDER — GABAPENTIN 100 MG PO CAPS: 100.0000 mg | ORAL_CAPSULE | Freq: Three times a day (TID) | ORAL | 0 refills | Status: DC

## 2016-11-05 MED ORDER — DICLOFENAC SODIUM 75 MG PO TBEC: 75.0000 mg | DELAYED_RELEASE_TABLET | Freq: Two times a day (BID) | ORAL | 0 refills | Status: DC

## 2016-11-05 NOTE — ED Triage Notes (Signed)
Pt states that he was just here two days ago and has taken all of his medication that was prescribed.  Pt is still experiencing pain in lower back that radiates down to the left leg.

## 2016-11-05 NOTE — ED Notes (Signed)
CBG 119 

## 2016-11-05 NOTE — ED Provider Notes (Signed)
[facility] Provider Note   CSN: [id number] Arrival date & time: 11/05/16  1221     History   Chief Complaint Chief Complaint  Patient presents with  . Back Pain    Pain radiates down left leg    HPI Robert Lyons is a 55 y.o. male.  The history is provided by the patient. No language interpreter was used.  Back Pain   This is a recurrent problem. The current episode started more than 1 week ago. The problem occurs constantly. The problem has been gradually worsening. The pain is associated with lifting heavy objects. The pain is present in the lumbar spine. The quality of the pain is described as aching. The pain is moderate. The pain is worse during the day. Pertinent negatives include no chest pain, no fever, no abdominal pain and no dysuria. He has tried nothing for the symptoms.  Pt reports he has been doing heavy lifting.    Past Medical History:  Diagnosis Date  . Diabetes mellitus   . GERD (gastroesophageal reflux disease)   . Hypertension   . Pneumonia     There are no active problems to display for this patient.   History reviewed. No pertinent surgical history.     Home Medications    Prior to Admission medications   Medication Sig Start Date End Date Taking? Authorizing Provider  aspirin 325 MG tablet Take 325 mg by mouth daily.    Historical Provider, MD  diazepam (VALIUM) 5 MG tablet Take 1 tablet (5 mg total) by mouth 2 (two) times daily. 10/28/16   Barrett HenleNicole Elizabeth Nadeau, PA-C  diclofenac (VOLTAREN) 75 MG EC tablet Take 1 tablet (75 mg total) by mouth 2 (two) times daily. 01/19/16   Ivery QualeHobson Bryant, PA-C  famotidine (PEPCID) 20 MG tablet Take 1 tablet (20 mg total) by mouth 2 (two) times daily. 10/05/14   Bethann BerkshireJoseph Zammit, MD  gabapentin (NEURONTIN) 100 MG capsule Take 100 mg by mouth 3 (three) times daily.    Historical Provider, MD  HYDROcodone-acetaminophen (NORCO/VICODIN) 5-325 MG tablet Take 1 tablet by mouth every 4 (four) hours as needed.  10/28/16   Barrett HenleNicole Elizabeth Nadeau, PA-C  ibuprofen (ADVIL,MOTRIN) 600 MG tablet Take 1 tablet (600 mg total) by mouth every 6 (six) hours as needed. 10/28/16   Barrett HenleNicole Elizabeth Nadeau, PA-C  lisinopril-hydrochlorothiazide (PRINZIDE,ZESTORETIC) 10-12.5 MG per tablet Take 1 tablet by mouth daily. 12/02/13   Vanetta MuldersScott Zackowski, MD  metFORMIN (GLUCOPHAGE) 1000 MG tablet Take 1 tablet (1,000 mg total) by mouth 2 (two) times daily. 01/19/16   Ivery QualeHobson Bryant, PA-C  traMADol Janean Sark(ULTRAM) 50 MG tablet 1 or 2 po q6h prn pain 01/19/16   Ivery QualeHobson Bryant, PA-C    Family History Family History  Problem Relation Age of Onset  . Diabetes Mother   . Hypertension Mother     Social History Social History  Substance Use Topics  . Smoking status: Current Every Day Smoker    Packs/day: 1.00    Types: Cigarettes  . Smokeless tobacco: Never Used  . Alcohol use No     Allergies   Patient has no known allergies.   Review of Systems Review of Systems  Constitutional: Negative for chills and fever.  HENT: Negative for ear pain and sore throat.   Eyes: Negative for pain and visual disturbance.  Respiratory: Negative for cough and shortness of breath.   Cardiovascular: Negative for chest pain and palpitations.  Gastrointestinal: Negative for abdominal pain and vomiting.  Genitourinary: Negative for  dysuria and hematuria.  Musculoskeletal: Positive for back pain. Negative for arthralgias.  Skin: Negative for color change and rash.  Neurological: Negative for seizures and syncope.  All other systems reviewed and are negative.    Physical Exam Updated Vital Signs BP 149/83   Pulse 80   Temp 97.7 F (36.5 C) (Oral)   Resp 16   Ht 5\' 11"  (1.803 m)   Wt 88.5 kg   SpO2 100%   BMI 27.20 kg/m   Physical Exam  Constitutional: He appears well-developed and well-nourished.  HENT:  Head: Normocephalic.  Eyes: Pupils are equal, round, and reactive to light.  Neck: Normal range of motion.  Cardiovascular:  Normal rate.   Pulmonary/Chest: Effort normal.  Abdominal: Soft.  Musculoskeletal:  Tender left low back,  From nv and ns intact   Neurological: He is alert.  Skin: Skin is warm.  Nursing note and vitals reviewed.    ED Treatments / Results  Labs (all labs ordered are listed, but only abnormal results are displayed) Labs Reviewed  CBG MONITORING, ED - Abnormal; Notable for the following:       Result Value   Glucose-Capillary 119 (*)    All other components within normal limits    EKG  EKG Interpretation None       Radiology Dg Lumbar Spine Complete  Result Date: 11/05/2016 CLINICAL DATA:  Low back pain after recent lifting injury. EXAM: LUMBAR SPINE - COMPLETE 4+ VIEW COMPARISON:  09/16/2011 CT abdomen/pelvis. FINDINGS: This report assumes 5 non rib-bearing lumbar vertebrae. Lumbar vertebral body heights are preserved, with no fracture. Mild multilevel degenerative disc disease in the visualized thoracolumbar spine, most prominent at L3-4 with mild interval progression. No spondylolisthesis. Minimal facet arthropathy in the bilateral lower lumbar spine. No aggressive appearing focal osseous lesions. IMPRESSION: 1. No fracture or spondylolisthesis. 2. Mild multilevel degenerative disc disease. 3. Minimal facet arthropathy in the bilateral lower lumbar spine. Electronically Signed   By: Delbert PhenixJason A Poff M.D.   On: 11/05/2016 14:56    Procedures Procedures (including critical care time)  Medications Ordered in ED Medications - No data to display   Initial Impression / Assessment and Plan / ED Course  I have reviewed the triage vital signs and the nursing notes.  Pertinent labs & imaging results that were available during my care of the patient were reviewed by me and considered in my medical decision making (see chart for details).  Clinical Course       Final Clinical Impressions(s) / ED Diagnoses   Final diagnoses:  Acute low back pain without sciatica, unspecified  back pain laterality    New Prescriptions New Prescriptions   DICLOFENAC (VOLTAREN) 50 MG EC TABLET    Take 1 tablet (50 mg total) by mouth 2 (two) times daily.   METHOCARBAMOL (ROBAXIN) 500 MG TABLET    Take 1 tablet (500 mg total) by mouth 2 (two) times daily.     Lonia SkinnerLeslie K Ferrer ComunidadSofia, PA-C 11/05/16 1551    Azalia BilisKevin Campos, MD 11/06/16 [phone]

## 2016-11-05 NOTE — Discharge Instructions (Signed)
Call the referral number to get a medical doctor to follow you.

## 2016-11-05 NOTE — ED Notes (Signed)
Pt states that he has not had his medicine for his diabetes in "about 1 year".

## 2017-07-06 DIAGNOSIS — Z139 Encounter for screening, unspecified: Secondary | ICD-10-CM

## 2017-07-06 LAB — GLUCOSE, POCT (MANUAL RESULT ENTRY): POC Glucose: 240 mg/dl — AB (ref 70–99)

## 2017-07-06 NOTE — Congregational Nurse Program (Signed)
Congregational Nurse Program Note  Date of Encounter: 07/06/2017  Past Medical History: Past Medical History:  Diagnosis Date  . Diabetes mellitus   . GERD (gastroesophageal reflux disease)   . Hypertension   . Pneumonia     Encounter Details:     CNP Questionnaire - 07/06/17 1357      Patient Demographics   Is this a new or existing patient? New   Patient is considered a/an Not Applicable   Race African-American/Black     Patient Assistance   Location of Patient Assistance [facility]   Patient's financial/insurance status Self-Pay (Uninsured)   Uninsured Patient ([place] [employer]s) Yes   Patient referred to apply for the following financial assistance Not Applicable   Food insecurities addressed Not Applicable   Transportation assistance No   Assistance securing medications No   Educational health offerings Hypertension;Diabetes;Navigating the healthcare system     Encounter Details   Primary purpose of visit Chronic Illness/Condition Visit;Education/Health Concerns;Navigating the Healthcare System   Was an Emergency Department visit averted? Not Applicable   Does patient have a medical provider? No   Patient referred to Area Agency;Clinic;Establish PCP   Was a mental health screening completed? (GAINS tool) No   Does patient have dental issues? Yes   Was a dental referral made? No resources for a referral   Does patient have vision issues? Yes   Was a vision referral made? No   Does your patient have an abnormal blood pressure today? No   Since previous encounter, have you referred patient for abnormal blood pressure that resulted in a new diagnosis or medication change? No   Does your patient have an abnormal blood glucose today? Yes   Since previous encounter, have you referred patient for abnormal blood glucose that resulted in a new diagnosis or medication change? No   Was there a life-saving intervention made? No      New client to [facility]. Client was a prior patient at the [facility] , but has not been there in about 2 years. Client has not seen anyone as a primary medical provider for that time. Client here today for screening and [facility].  Medical History: Hypertension Diabetes GERD Dental issues Lower back pain  Current medications: Tylenol as needed No Medications for Diabetes for a couple years per client.   Alert and oriented to person and place, does not appear anxious or nervous. States he is currently living with his mother , but situation may be changing soon. Client states [facility] with [facility] and was "let go". He is now wishing to get referred back into the clinic. Chief complaints today is burning of his feet greater at night. Appears previously he had been prescribed gabapentin. He also complains of some pain under his right ribs, bowel sounds positive x 4. Client states he doesn't feel like he totally "empties" when he has a bowel movement. States he usually has bowel movement once a day now and previously was several times a day. In questioning client that may have been when he was taking metformin, but he is uncertain. He also complains of bilateral shoulder pain in the joints, denies shortness of breath. Also reports history of lower back pain, but not present today. He states "i just needs some labs drawn and get checked out again"  He denies frequent urination, but does report blurry vision greater when first awakening and also dental pain and dental  needs. He states he was on "lists for dental and eyes" when [facility] was [facility]. Will refer client into the free clinic, appointment secured for 07/10/17 at 1:15 pm discussed with client to call 24 hrs ahead if he could not make his appointments and if it is after hours or weekends to leave a voicemail with date and time he called. Client states understanding.  Discussed with client diabetic  diet and handouts for my plate, tips for healthy eating and smoking cessation given to client.  Vitals today: 129/74, pulse 90, temp 98.0 orally, random glucose 240 ( last ate one hour ago).  Will follow as needed.

## 2017-07-10 ENCOUNTER — Ambulatory Visit: Payer: Self-pay | Admitting: Physician Assistant

## 2017-07-10 ENCOUNTER — Encounter: Payer: Self-pay | Admitting: Physician Assistant

## 2017-07-10 ENCOUNTER — Other Ambulatory Visit: Payer: Self-pay | Admitting: Physician Assistant

## 2017-07-10 VITALS — BP 140/80 | HR 90 | Temp 97.7°F | Ht 70.25 in | Wt 186.8 lb

## 2017-07-10 DIAGNOSIS — F17210 Nicotine dependence, cigarettes, uncomplicated: Secondary | ICD-10-CM | POA: Insufficient documentation

## 2017-07-10 DIAGNOSIS — E785 Hyperlipidemia, unspecified: Secondary | ICD-10-CM

## 2017-07-10 DIAGNOSIS — I10 Essential (primary) hypertension: Secondary | ICD-10-CM | POA: Insufficient documentation

## 2017-07-10 DIAGNOSIS — Z1211 Encounter for screening for malignant neoplasm of colon: Secondary | ICD-10-CM

## 2017-07-10 DIAGNOSIS — E118 Type 2 diabetes mellitus with unspecified complications: Principal | ICD-10-CM

## 2017-07-10 DIAGNOSIS — Z125 Encounter for screening for malignant neoplasm of prostate: Secondary | ICD-10-CM

## 2017-07-10 DIAGNOSIS — E1165 Type 2 diabetes mellitus with hyperglycemia: Secondary | ICD-10-CM

## 2017-07-10 DIAGNOSIS — R634 Abnormal weight loss: Secondary | ICD-10-CM

## 2017-07-10 DIAGNOSIS — IMO0002 Reserved for concepts with insufficient information to code with codable children: Secondary | ICD-10-CM | POA: Insufficient documentation

## 2017-07-10 DIAGNOSIS — R10817 Generalized abdominal tenderness: Secondary | ICD-10-CM

## 2017-07-10 DIAGNOSIS — R1310 Dysphagia, unspecified: Secondary | ICD-10-CM

## 2017-07-10 MED ORDER — LISINOPRIL 10 MG PO TABS: 10.0000 mg | ORAL_TABLET | Freq: Every day | ORAL | 1 refills | Status: DC

## 2017-07-10 MED ORDER — METFORMIN HCL 500 MG PO TABS: 500.0000 mg | ORAL_TABLET | Freq: Two times a day (BID) | ORAL | 1 refills | Status: DC

## 2017-07-10 MED ORDER — PANTOPRAZOLE SODIUM 40 MG PO TBEC: 40.0000 mg | DELAYED_RELEASE_TABLET | Freq: Every day | ORAL | 1 refills | Status: DC

## 2017-07-10 NOTE — Patient Instructions (Addendum)
Get blood/labs done Turn in [employer] discount application  ____________________________________________________________  Dysphagia Dysphagia is trouble swallowing. This condition occurs when solids and liquids stick in a person's throat on the way down to the stomach, or when food takes longer to get to the stomach. You may have problems swallowing food, liquids, or both. You may also have pain while trying to swallow. It may take you more time and effort to swallow something. What are the causes? This condition is caused by:  Problems with the muscles. They may make it difficult for you to move food and liquids through the tube that connects your mouth to your stomach (esophagus). You may have ulcers, scar tissue, or inflammation that blocks the normal passage of food and liquids. Causes of these problems include: ? Acid reflux from your stomach into your esophagus (gastroesophageal reflux). ? Infections. ? Radiation treatment for cancer. ? Medicines taken without enough fluids to wash them down into your stomach.  Nerve problems. These prevent signals from being sent to the muscles of your esophagus to squeeze (contract) and move what you swallow down to your stomach.  Globus pharyngeus. This is a common problem that involves feeling like something is stuck in the throat or a sense of trouble with swallowing even though nothing is wrong with the swallowing passages.  Stroke. This can affect the nerves and make it difficult to swallow.  Certain conditions, such as cerebral palsy or Parkinson disease.  What are the signs or symptoms? Common symptoms of this condition include:  A feeling that solids or liquids are stuck in your throat on the way down to the stomach.  Food taking too long to get to the stomach.  Other symptoms include:  Food moving back from your stomach to your mouth (regurgitation).  Noises coming from your throat.  Chest discomfort with swallowing.  A feeling of  fullness when swallowing.  Drooling, especially when the throat is blocked.  Pain while swallowing.  Heartburn.  Coughing or gagging while trying to swallow.  How is this diagnosed? This condition is diagnosed by:  Barium X-ray. In this test, you swallow a white substance (contrast medium)that sticks to the inside of your esophagus. X-ray images are then taken.  Endoscopy. In this test, a flexible telescope is inserted down your throat to look at your esophagus and your stomach.  CT scans and MRI.  How is this treated? Treatment for dysphagia depends on the cause of the condition:  If the dysphagia is caused by acid reflux or infection, medicines may be used. They may include antibiotics and heartburn medicines.  If the dysphagia is caused by problems with your muscles, swallowing therapy may be used to help you strengthen your swallowing muscles. You may have to do specific exercises to strengthen the muscles or stretch them.  If the dysphagia is caused by a blockage or mass, procedures to remove the blockage may be done. You may need surgery and a feeding tube.  You may need to make diet changes. Ask your health care provider for specific instructions. Follow these instructions at home: Eating and drinking  Try to eat soft food that is easier to swallow.  Follow any diet changes as told by your health care provider.  Cut your food into small pieces and eat slowly.  Eat and drink only when you are sitting upright.  Do not drink alcohol or caffeine. If you need help quitting, ask your health care provider. General instructions  Check your weight every day  to make sure you are not losing weight.  Take over-the-counter and prescription medicines only as told by your health care provider.  If you were prescribed an antibiotic medicine, take it as told by your health care provider. Do not stop taking the antibiotic even if you start to feel better.  Do not use any  products that contain nicotine or tobacco, such as cigarettes and e-cigarettes. If you need help quitting, ask your health care provider.  Keep all follow-up visits as told by your health care provider. This is important. Contact a health care provider if:  You lose weight because you cannot swallow.  You cough when you drink liquids (aspiration).  You cough up partially digested food. Get help right away if:  You cannot swallow your saliva.  You have shortness of breath or a fever, or both.  You have a hoarse voice and also have trouble swallowing. Summary  Dysphagia is trouble swallowing. This condition occurs when solids and liquids stick in a person's throat on the way down to the stomach, or when food takes longer to get to the stomach.  Dysphagia has many possible causes and symptoms.  Treatment for dysphagia depends on the cause of the condition. This information is not intended to replace advice given to you by your health care provider. Make sure you discuss any questions you have with your health care provider. Document Released: 12/09/2000 Document Revised: 12/01/2016 Document Reviewed: 12/01/2016 Elsevier Interactive Patient Education  2017 [employer].

## 2017-07-10 NOTE — Progress Notes (Signed)
BP 140/80 (BP Location: Left Arm, Patient Position: Sitting, Cuff Size: Normal)   Pulse 90   Temp 97.7 F (36.5 C)   Ht 5' 10.25" (1.784 m)   Wt 186 lb 12 oz (84.7 kg)   SpO2 97%   BMI 26.61 kg/m    Subjective:    Patient ID: Robert Lyons, male    DOB: [birth year], 56 y.o.   MRN: [id number]  HPI: Robert Lyons is a 56 y.o. male presenting on 07/10/2017 for New Patient (Initial Visit) (has been [facility] and Shoulder Pain (L shoulder)   HPI   Chief Complaint  Patient presents with  . New Patient (Initial Visit)    has been [facility] off all [facility]s [facility]  [name]. Shoulder Pain    L shoulder       Last seen 03/25/15. Pt dismissed due to multiple no-shows.   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  He didn't go anywhere else after here so he has gotten no medical care except in the ER since early 2016.   Pt c/o both shoulders hurting, L>R.  No injury.  Also neck hurt.  Also both legs hurt, L>R.    Pt says he hurts all over.   Pt states trouble swallowing for past 4-6 months.  Foods feel "like they're still sitting there:  He says it's every kind of food.  Liquids are no problem.   Pt weighed 198 at last OV here. Now 186.  Lost 12 pounds past 2 years.   Relevant past medical, surgical, family and social history reviewed and updated as indicated. Interim medical history since our last visit reviewed. Allergies and medications reviewed and updated.   Current Outpatient Prescriptions:  .  acetaminophen (TYLENOL) 500 MG tablet, Take 500-1,000 mg by mouth daily as needed., Disp: , Rfl:  .  famotidine (PEPCID) 20 MG tablet, Take 1 tablet (20 mg total) by mouth 2 (two) times daily., Disp: 10 tablet, Rfl: 0   Review of Systems  Constitutional: Positive for diaphoresis, fatigue and unexpected weight change. Negative for appetite change, chills and fever.  HENT: Positive for  congestion and trouble swallowing. Negative for dental problem, drooling, ear pain, facial swelling, hearing loss, mouth sores, sneezing, sore throat and voice change.   Eyes: Positive for itching. Negative for pain, discharge, redness and visual disturbance.  Respiratory: Positive for shortness of breath. Negative for cough, choking and wheezing.   Cardiovascular: Positive for leg swelling. Negative for chest pain and palpitations.  Gastrointestinal: Positive for constipation. Negative for abdominal pain, blood in stool, diarrhea and vomiting.  Endocrine: Positive for polydipsia. Negative for cold intolerance and heat intolerance.  Genitourinary: Negative for decreased urine volume, dysuria and hematuria.  Musculoskeletal: Positive for arthralgias and gait problem. Negative for back pain.  Skin: Negative for rash.  Allergic/Immunologic: Negative for environmental allergies.  Neurological: Positive for headaches. Negative for seizures, syncope and light-headedness.  Hematological: Negative for adenopathy.  Psychiatric/Behavioral: Negative for agitation, dysphoric mood and suicidal ideas. The patient is not nervous/anxious.     Per HPI unless specifically indicated above     Objective:    BP 140/80 (BP Location: Left Arm, Patient Position: Sitting, Cuff Size: Normal)   Pulse 90   Temp 97.7 F (36.5 C)   Ht 5' 10.25" (1.784 m)   Wt 186 lb 12 oz (84.7 kg)  SpO2 97%   BMI 26.61 kg/m   Wt Readings from Last 3 Encounters:  07/10/17 186 lb 12 oz (84.7 kg)  07/06/17 189 lb 9.6 oz (86 kg)  11/05/16 195 lb (88.5 kg)    Physical Exam  Constitutional: He is oriented to person, place, and time. He appears well-developed and well-nourished.  HENT:  Head: Normocephalic and atraumatic.  Mouth/Throat: Oropharynx is clear and moist. No oropharyngeal exudate.  Eyes: Pupils are equal, round, and reactive to light. Conjunctivae and EOM are normal.  Neck: Neck supple. No thyromegaly present.   Cardiovascular: Normal rate and regular rhythm.   Pulmonary/Chest: Effort normal and breath sounds normal. He has no wheezes. He has no rales.  Abdominal: Soft. Bowel sounds are normal. He exhibits no distension, no fluid wave, no ascites and no mass. There is no hepatosplenomegaly. There is generalized tenderness. There is no rigidity, no rebound and no guarding.  Musculoskeletal: He exhibits no edema.  Lymphadenopathy:    He has no cervical adenopathy.  Neurological: He is alert and oriented to person, place, and time.  Skin: Skin is warm and dry. No rash noted.  Psychiatric: He has a normal mood and affect. His behavior is normal. Thought content normal.  Vitals reviewed.       Assessment & Plan:   Encounter Diagnoses  Name Primary?  [name] Uncontrolled type 2 diabetes mellitus with complication, unspecified whether long term insulin use (HCC) Yes  . Essential hypertension   . Hyperlipidemia, unspecified hyperlipidemia type   . Dysphagia, unspecified type   . Weight loss   . Generalized abdominal tenderness without rebound tenderness   . Cigarette nicotine dependence without complication   . Screening for prostate cancer   . Screening for colon cancer      -pt to get fasting Labs drawn tomorrow morning -pt given iFOBT for colon cancer screening -rx metformin and lisinopril and protonix -Gave [employer] discount application  -will Refer to GI for dysphagia -pt unintentional weight loss may be due to his untreated diabetes or may be related to his dysphagia/abdominal tenderess -will get pt signed up for [facility]- (rx omeprazole) -counseled pt to use caution with eating, particularly with bread and meats.  He is counseled to follow all pills with full glass of water.  Pt is given handout on dysphagia -counseled smoking cessation -pt to follow up in one month. RTO sooner prn

## 2017-07-11 ENCOUNTER — Other Ambulatory Visit (HOSPITAL_COMMUNITY)
Admission: RE | Admit: 2017-07-11 | Discharge: 2017-07-11 | Disposition: A | Discharge status: Home / Self Care | Payer: Self-pay | Source: Ambulatory Visit | Attending: Physician Assistant | Admitting: Physician Assistant

## 2017-07-11 DIAGNOSIS — R634 Abnormal weight loss: Secondary | ICD-10-CM | POA: Insufficient documentation

## 2017-07-11 DIAGNOSIS — E785 Hyperlipidemia, unspecified: Secondary | ICD-10-CM | POA: Insufficient documentation

## 2017-07-11 DIAGNOSIS — E1165 Type 2 diabetes mellitus with hyperglycemia: Secondary | ICD-10-CM | POA: Insufficient documentation

## 2017-07-11 DIAGNOSIS — I10 Essential (primary) hypertension: Secondary | ICD-10-CM | POA: Insufficient documentation

## 2017-07-11 DIAGNOSIS — E118 Type 2 diabetes mellitus with unspecified complications: Secondary | ICD-10-CM | POA: Insufficient documentation

## 2017-07-11 DIAGNOSIS — R1310 Dysphagia, unspecified: Secondary | ICD-10-CM

## 2017-07-11 DIAGNOSIS — R10817 Generalized abdominal tenderness: Secondary | ICD-10-CM | POA: Insufficient documentation

## 2017-07-11 DIAGNOSIS — Z125 Encounter for screening for malignant neoplasm of prostate: Secondary | ICD-10-CM | POA: Insufficient documentation

## 2017-07-11 LAB — LIPID PANEL
Cholesterol: 157 mg/dL (ref 0–200)
HDL: 36 mg/dL — ABNORMAL LOW (ref >40)
LDL CALC: 85 mg/dL (ref 0–99)
Total CHOL/HDL Ratio: 4.4 RATIO
Triglycerides: 181 mg/dL — ABNORMAL HIGH (ref <150)
VLDL: 36 mg/dL (ref 0–40)

## 2017-07-11 LAB — PSA: Prostatic Specific Antigen: 0.41 ng/mL (ref 0.00–4.00)

## 2017-07-11 LAB — COMPREHENSIVE METABOLIC PANEL
ALBUMIN: 3.9 g/dL (ref 3.5–5.0)
ALT: 12 U/L — AB (ref 17–63)
AST: 17 U/L (ref 15–41)
Alkaline Phosphatase: 65 U/L (ref 38–126)
Anion gap: 9 (ref 5–15)
BUN: 6 mg/dL (ref 6–20)
CHLORIDE: 105 mmol/L (ref 101–111)
CO2: 26 mmol/L (ref 22–32)
Calcium: 8.9 mg/dL (ref 8.9–10.3)
Creatinine, Ser: 1.05 mg/dL (ref 0.61–1.24)
GFR calc non Af Amer: >60 mL/min (ref >60)
GLUCOSE: 155 mg/dL — AB (ref 65–99)
Potassium: 3.4 mmol/L — ABNORMAL LOW (ref 3.5–5.1)
SODIUM: 140 mmol/L (ref 135–145)
Total Bilirubin: 0.8 mg/dL (ref 0.3–1.2)
Total Protein: 6.8 g/dL (ref 6.5–8.1)

## 2017-07-11 LAB — CBC
HCT: 33.9 % — ABNORMAL LOW (ref 39.0–52.0)
Hemoglobin: 11.8 g/dL — ABNORMAL LOW (ref 13.0–17.0)
MCH: 31.2 pg (ref 26.0–34.0)
MCHC: 34.8 g/dL (ref 30.0–36.0)
MCV: 89.7 fL (ref 78.0–100.0)
Platelets: 208 10*3/uL (ref 150–400)
RBC: 3.78 MIL/uL — AB (ref 4.22–5.81)
RDW: 12.2 % (ref 11.5–15.5)
WBC: 5.9 10*3/uL (ref 4.0–10.5)

## 2017-07-12 LAB — HEMOGLOBIN A1C
Hgb A1c MFr Bld: 6.2 % — ABNORMAL HIGH (ref 4.8–5.6)
Mean Plasma Glucose: 131 mg/dL

## 2017-07-12 LAB — MICROALBUMIN, URINE: MICROALB UR: 9 ug/mL — AB

## 2017-07-12 LAB — H. PYLORI ANTIBODY, IGG: H PYLORI IGG: 5.56 {index_val} — AB (ref 0.00–0.79)

## 2017-07-18 ENCOUNTER — Telehealth: Payer: Self-pay

## 2017-07-18 NOTE — Telephone Encounter (Signed)
Attempted to call client to follow up after [facility] appointment. Phone number listed states client unavailable.

## 2017-07-19 LAB — IFOBT (OCCULT BLOOD): IMMUNOLOGICAL FECAL OCCULT BLOOD TEST: NEGATIVE

## 2017-08-15 ENCOUNTER — Ambulatory Visit: Payer: Self-pay | Admitting: Physician Assistant

## 2017-08-15 ENCOUNTER — Encounter: Payer: Self-pay | Admitting: Physician Assistant

## 2017-08-15 VITALS — BP 148/74 | HR 98 | Temp 97.7°F | Ht 70.25 in | Wt 195.0 lb

## 2017-08-15 DIAGNOSIS — IMO0002 Reserved for concepts with insufficient information to code with codable children: Secondary | ICD-10-CM | POA: Insufficient documentation

## 2017-08-15 DIAGNOSIS — F17210 Nicotine dependence, cigarettes, uncomplicated: Secondary | ICD-10-CM

## 2017-08-15 DIAGNOSIS — E876 Hypokalemia: Secondary | ICD-10-CM

## 2017-08-15 DIAGNOSIS — R7303 Prediabetes: Secondary | ICD-10-CM

## 2017-08-15 DIAGNOSIS — I10 Essential (primary) hypertension: Secondary | ICD-10-CM

## 2017-08-15 DIAGNOSIS — A048 Other specified bacterial intestinal infections: Secondary | ICD-10-CM

## 2017-08-15 DIAGNOSIS — D649 Anemia, unspecified: Secondary | ICD-10-CM

## 2017-08-15 DIAGNOSIS — E1165 Type 2 diabetes mellitus with hyperglycemia: Secondary | ICD-10-CM | POA: Insufficient documentation

## 2017-08-15 DIAGNOSIS — E1139 Type 2 diabetes mellitus with other diabetic ophthalmic complication: Secondary | ICD-10-CM | POA: Insufficient documentation

## 2017-08-15 MED ORDER — METFORMIN HCL ER 500 MG PO TB24: 500.0000 mg | ORAL_TABLET | Freq: Every day | ORAL | 1 refills | Status: DC

## 2017-08-15 MED ORDER — BIS SUBCIT-METRONID-TETRACYC 140-125-125 MG PO CAPS: 3.0000 | ORAL_CAPSULE | Freq: Three times a day (TID) | ORAL | 0 refills | Status: DC

## 2017-08-15 NOTE — Patient Instructions (Signed)
Prediabetes Prediabetes is the condition of having a blood sugar (blood glucose) level that is higher than it should be, but not high enough for you to be diagnosed with type 2 diabetes. Having prediabetes puts you at risk for developing type 2 diabetes (type 2 diabetes mellitus). Prediabetes may be called impaired glucose tolerance or impaired fasting glucose. Prediabetes usually does not cause symptoms. Your health care provider can diagnose this condition with blood tests. You may be tested for prediabetes if you are overweight and if you have at least one other risk factor for prediabetes. Risk factors for prediabetes include:  Having a family member with type 2 diabetes.  Being overweight or obese.  Being older than age 45.  Being of American-Indian, African-American, Hispanic/Latino, or Asian/Pacific Islander descent.  Having an inactive (sedentary) lifestyle.  Having a history of gestational diabetes or polycystic ovarian syndrome (PCOS).  Having low levels of good cholesterol (HDL-C) or high levels of blood fats (triglycerides).  Having high blood pressure.  What is blood glucose and how is blood glucose measured?  Blood glucose refers to the amount of glucose in your bloodstream. Glucose comes from eating foods that contain sugars and starches (carbohydrates) that the body breaks down into glucose. Your blood glucose level may be measured in mg/dL (milligrams per deciliter) or mmol/L (millimoles per liter).Your blood glucose may be checked with one or more of the following blood tests:  A fasting blood glucose (FBG) test. You will not be allowed to eat (you will fast) for at least 8 hours before a blood sample is taken. ? A normal range for FBG is 70-100 mg/dl (3.9-5.6 mmol/L).  An A1c (hemoglobin A1c) blood test. This test provides information about blood glucose control over the previous 2?3months.  An oral glucose tolerance test (OGTT). This test measures your blood  glucose twice: ? After fasting. This is your baseline level. ? Two hours after you drink a beverage that contains glucose.  You may be diagnosed with prediabetes:  If your FBG is 100?125 mg/dL (5.6-6.9 mmol/L).  If your A1c level is 5.7?6.4%.  If your OGGT result is 140?199 mg/dL (7.8-11 mmol/L).  These blood tests may be repeated to confirm your diagnosis. What happens if blood glucose is too high? The pancreas produces a hormone (insulin) that helps move glucose from the bloodstream into cells. When cells in the body do not respond properly to insulin that the body makes (insulin resistance), excess glucose builds up in the blood instead of going into cells. As a result, high blood glucose (hyperglycemia) can develop, which can cause many complications. This is a symptom of prediabetes. What can happen if blood glucose stays higher than normal for a long time? Having high blood glucose for a long time is dangerous. Too much glucose in your blood can damage your nerves and blood vessels. Long-term damage can lead to complications from diabetes, which may include:  Heart disease.  Stroke.  Blindness.  Kidney disease.  Depression.  Poor circulation in the feet and legs, which could lead to surgical removal (amputation) in severe cases.  How can prediabetes be prevented from turning into type 2 diabetes?  To help prevent type 2 diabetes, take the following actions:  Be physically active. ? Do moderate-intensity physical activity for at least 30 minutes on at least 5 days of the week, or as much as told by your health care provider. This could be brisk walking, biking, or water aerobics. ? Ask your health care provider what   activities are safe for you. A mix of physical activities may be best, such as walking, swimming, cycling, and strength training.  Lose weight as told by your health care provider. ? Losing 5-7% of your body weight can reverse insulin resistance. ? Your health  care provider can determine how much weight loss is best for you and can help you lose weight safely.  Follow a healthy meal plan. This includes eating lean proteins, complex carbohydrates, fresh fruits and vegetables, low-fat dairy products, and healthy fats. ? Follow instructions from your health care provider about eating or drinking restrictions. ? Make an appointment to see a diet and nutrition specialist (registered dietitian) to help you create a healthy eating plan that is right for you.  Do not smoke or use any tobacco products, such as cigarettes, chewing tobacco, and e-cigarettes. If you need help quitting, ask your health care provider.  Take over-the-counter and prescription medicines as told by your health care provider. You may be prescribed medicines that help lower the risk of type 2 diabetes.  This information is not intended to replace advice given to you by your health care provider. Make sure you discuss any questions you have with your health care provider. Document Released: 04/04/2016 Document Revised: 05/19/2016 Document Reviewed: 02/02/2016 Elsevier Interactive Patient Education  2018 [employer]  

## 2017-08-15 NOTE — Progress Notes (Signed)
BP (!) 148/74 (BP Location: Left Arm, Patient Position: Sitting, Cuff Size: Large)   Pulse 98   Temp 97.7 F (36.5 C) (Other (Comment))   Ht 5' 10.25" (1.784 m)   Wt 195 lb (88.5 kg)   SpO2 97%   BMI 27.78 kg/m    Subjective:    Patient ID: Robert Lyons, male    DOB: 01-08-1961, 56 y.o.   MRN: [id number]  HPI: Robert Lyons is a 56 y.o. male presenting on 08/15/2017 for Follow-up   HPI    Pt states he only took the pepcid once b/c he thought it made him dizzy.   Pt did not turn in his [employer] application  Pt states he still has a little bit of the dysphagia.    Pt did not bring his meds with him today  Relevant past medical, surgical, family and social history reviewed and updated as indicated. Interim medical history since our last visit reviewed. Allergies and medications reviewed and updated.   Current Outpatient Prescriptions:  .  acetaminophen (TYLENOL) 500 MG tablet, Take 500-1,000 mg by mouth daily as needed., Disp: , Rfl:  .  famotidine (PEPCID) 20 MG tablet, Take 1 tablet (20 mg total) by mouth 2 (two) times daily., Disp: 10 tablet, Rfl: 0 .  lisinopril (PRINIVIL,ZESTRIL) 10 MG tablet, Take 1 tablet (10 mg total) by mouth daily., Disp: 30 tablet, Rfl: 1 .  metFORMIN (GLUCOPHAGE) 500 MG tablet, Take 1 tablet (500 mg total) by mouth 2 (two) times daily with a meal., Disp: 60 tablet, Rfl: 1 .  pantoprazole (PROTONIX) 40 MG tablet, Take 1 tablet (40 mg total) by mouth daily. (Patient not taking: Reported on 08/15/2017), Disp: 30 tablet, Rfl: 1   Review of Systems  Constitutional: Negative for appetite change, chills, diaphoresis, fatigue, fever and unexpected weight change.  HENT: Positive for dental problem. Negative for congestion, drooling, ear pain, facial swelling, hearing loss, mouth sores, sneezing, sore throat, trouble swallowing and voice change.   Eyes: Positive for pain, discharge, itching and visual disturbance. Negative for redness.   Respiratory: Positive for cough and shortness of breath. Negative for choking and wheezing.   Cardiovascular: Negative for chest pain, palpitations and leg swelling.  Gastrointestinal: Positive for constipation. Negative for abdominal pain, blood in stool, diarrhea and vomiting.  Endocrine: Negative for cold intolerance, heat intolerance and polydipsia.  Genitourinary: Negative for decreased urine volume, dysuria and hematuria.  Musculoskeletal: Positive for back pain. Negative for arthralgias and gait problem.  Skin: Negative for rash.  Allergic/Immunologic: Negative for environmental allergies.  Neurological: Negative for seizures, syncope, light-headedness and headaches.  Hematological: Negative for adenopathy.  Psychiatric/Behavioral: Negative for agitation, dysphoric mood and suicidal ideas. The patient is not nervous/anxious.     Per HPI unless specifically indicated above     Objective:    BP (!) 148/74 (BP Location: Left Arm, Patient Position: Sitting, Cuff Size: Large)   Pulse 98   Temp 97.7 F (36.5 C) (Other (Comment))   Ht 5' 10.25" (1.784 m)   Wt 195 lb (88.5 kg)   SpO2 97%   BMI 27.78 kg/m   Wt Readings from Last 3 Encounters:  08/15/17 195 lb (88.5 kg)  07/10/17 186 lb 12 oz (84.7 kg)  07/06/17 189 lb 9.6 oz (86 kg)    Physical Exam  Constitutional: He is oriented to person, place, and time. He appears well-developed and well-nourished.  HENT:  Head: Normocephalic and atraumatic.  Neck: Neck supple.  Cardiovascular: Normal  rate and regular rhythm.   Pulmonary/Chest: Effort normal and breath sounds normal. He has no wheezes.  Abdominal: Soft. Bowel sounds are normal. There is no hepatosplenomegaly. There is no tenderness.  Musculoskeletal: He exhibits no edema.  Lymphadenopathy:    He has no cervical adenopathy.  Neurological: He is alert and oriented to person, place, and time.  Skin: Skin is warm and dry.  Psychiatric: He has a normal mood and affect.  His behavior is normal.  Vitals reviewed.   Results for orders placed or performed during the hospital encounter of 07/11/17  Lipid panel  Result Value Ref Range   Cholesterol 157 0 - 200 mg/dL   Triglycerides 428 (H) <150 mg/dL   HDL 36 (L) >76 mg/dL   Total CHOL/HDL Ratio 4.4 RATIO   VLDL 36 0 - 40 mg/dL   LDL Cholesterol 85 0 - 99 mg/dL  Comprehensive Metabolic Panel (CMET)  Result Value Ref Range   Sodium 140 135 - 145 mmol/L   Potassium 3.4 (L) 3.5 - 5.1 mmol/L   Chloride 105 101 - 111 mmol/L   CO2 26 22 - 32 mmol/L   Glucose, Bld 155 (H) 65 - 99 mg/dL   BUN 6 6 - 20 mg/dL   Creatinine, Ser 8.11 0.61 - 1.24 mg/dL   Calcium 8.9 8.9 - 57.2 mg/dL   Total Protein 6.8 6.5 - 8.1 g/dL   Albumin 3.9 3.5 - 5.0 g/dL   AST 17 15 - 41 U/L   ALT 12 (L) 17 - 63 U/L   Alkaline Phosphatase 65 38 - 126 U/L   Total Bilirubin 0.8 0.3 - 1.2 mg/dL   GFR calc non Af Amer >60 >60 mL/min   GFR calc Af Amer >60 >60 mL/min   Anion gap 9 5 - 15  CBC  Result Value Ref Range   WBC 5.9 4.0 - 10.5 K/uL   RBC 3.78 (L) 4.22 - 5.81 MIL/uL   Hemoglobin 11.8 (L) 13.0 - 17.0 g/dL   HCT 62.0 (L) 35.5 - 97.4 %   MCV 89.7 78.0 - 100.0 fL   MCH 31.2 26.0 - 34.0 pg   MCHC 34.8 30.0 - 36.0 g/dL   RDW 16.3 84.5 - 36.4 %   Platelets 208 150 - 400 K/uL  HgB A1c  Result Value Ref Range   Hgb A1c MFr Bld 6.2 (H) 4.8 - 5.6 %   Mean Plasma Glucose 131 mg/dL  Microalbumin, urine  Result Value Ref Range   Microalb, Ur 9.0 (H) Not Estab. ug/mL  PSA  Result Value Ref Range   Prostatic Specific Antigen 0.41 0.00 - 4.00 ng/mL  H. pylori antibody, IgG  Result Value Ref Range   H Pylori IgG 5.56 (H) 0.00 - 0.79 Index Value      Assessment & Plan:   Encounter Diagnoses  Name Primary?  . Essential hypertension Yes  . Prediabetes   . Hypokalemia   . H. pylori infection   . Anemia, unspecified type   . Cigarette nicotine dependence without complication     -reviewed labs with pt -Discussed with pt that  he is no longer diabetic but is pre-diabetic  (a1c 6.2 with no medications).  Cut back metformin to xr 500 qd. -will Recheck K+  Today when he leaves the office -will order pylera through pt assistance program due to pt cannot afford treatment medications -pt to F/u 1 month with all meds and recheck bp. Pt to RTO sooner prn  (The duration of  this appointment visit was 25 minutes of face-to-face time with the patient.  Greater than 50% of this time was spent in counseling, explanation of diagnosis, planning of further management, and coordination of care.)

## 2017-09-13 ENCOUNTER — Ambulatory Visit: Payer: Self-pay | Admitting: Physician Assistant

## 2017-09-19 ENCOUNTER — Encounter: Payer: Self-pay | Admitting: Physician Assistant

## 2017-09-19 ENCOUNTER — Ambulatory Visit: Payer: Self-pay | Admitting: Physician Assistant

## 2017-09-19 VITALS — BP 128/68 | HR 105 | Temp 97.7°F | Ht 70.25 in | Wt 189.5 lb

## 2017-09-19 DIAGNOSIS — D649 Anemia, unspecified: Secondary | ICD-10-CM

## 2017-09-19 DIAGNOSIS — I10 Essential (primary) hypertension: Secondary | ICD-10-CM

## 2017-09-19 DIAGNOSIS — R7303 Prediabetes: Secondary | ICD-10-CM

## 2017-09-19 DIAGNOSIS — K219 Gastro-esophageal reflux disease without esophagitis: Secondary | ICD-10-CM

## 2017-09-19 DIAGNOSIS — F17210 Nicotine dependence, cigarettes, uncomplicated: Secondary | ICD-10-CM

## 2017-09-19 DIAGNOSIS — E785 Hyperlipidemia, unspecified: Secondary | ICD-10-CM

## 2017-09-19 MED ORDER — RANITIDINE HCL 300 MG PO TABS: 300.0000 mg | ORAL_TABLET | Freq: Every day | ORAL | 2 refills | Status: DC

## 2017-09-19 NOTE — Progress Notes (Signed)
BP 128/68 (BP Location: Left Arm, Patient Position: Sitting, Cuff Size: Normal)   Pulse (!) 105   Temp 97.7 F (36.5 C)   Ht 5' 10.25" (1.784 m)   Wt 189 lb 8 oz (86 kg)   SpO2 96%   BMI 27.00 kg/m    Subjective:    Patient ID: Robert Lyons, male    DOB: April 22, 1961, 56 y.o.   MRN: [id number]  HPI: Robert Lyons is a 56 y.o. male presenting on 09/19/2017 for Hypertension and GI Problem (H pylori.)   HPI   Pt finished his pylera - a few days ago or a week ago - he isn't sure when.  He thinks he feels some better but still feels like he has some acid.  Pt did not bring his meds again today.   Relevant past medical, surgical, family and social history reviewed and updated as indicated. Interim medical history since our last visit reviewed. Allergies and medications reviewed and updated.   Current Outpatient Prescriptions:  .  acetaminophen (TYLENOL) 500 MG tablet, Take 500-1,000 mg by mouth daily as needed., Disp: , Rfl:  .  lisinopril (PRINIVIL,ZESTRIL) 10 MG tablet, Take 1 tablet (10 mg total) by mouth daily., Disp: 30 tablet, Rfl: 1 .  metFORMIN (GLUCOPHAGE XR) 500 MG 24 hr tablet, Take 1 tablet (500 mg total) by mouth daily with breakfast., Disp: 30 tablet, Rfl: 1 .  bismuth-metronidazole-tetracycline (PYLERA) 140-125-125 MG capsule, Take 3 capsules by mouth 4 (four) times daily -  before meals and at bedtime., Disp: 120 capsule, Rfl: 0 .  famotidine (PEPCID) 20 MG tablet, Take 1 tablet (20 mg total) by mouth 2 (two) times daily. (Patient not taking: Reported on 09/19/2017), Disp: 10 tablet, Rfl: 0   Review of Systems  Constitutional: Negative for appetite change, chills, diaphoresis, fatigue, fever and unexpected weight change.  HENT: Positive for dental problem, sneezing and trouble swallowing. Negative for congestion, drooling, ear pain, facial swelling, hearing loss, mouth sores, sore throat and voice change.   Eyes: Positive for pain, discharge, redness, itching  and visual disturbance.  Respiratory: Positive for cough, shortness of breath and wheezing. Negative for choking.   Cardiovascular: Negative for chest pain, palpitations and leg swelling.  Gastrointestinal: Positive for constipation. Negative for abdominal pain, blood in stool, diarrhea and vomiting.  Endocrine: Negative for cold intolerance, heat intolerance and polydipsia.  Genitourinary: Negative for decreased urine volume, dysuria and hematuria.  Musculoskeletal: Positive for back pain. Negative for arthralgias and gait problem.  Skin: Negative for rash.  Allergic/Immunologic: Negative for environmental allergies.  Neurological: Negative for seizures, syncope, light-headedness and headaches.  Hematological: Negative for adenopathy.  Psychiatric/Behavioral: Negative for agitation, dysphoric mood and suicidal ideas. The patient is not nervous/anxious.     Per HPI unless specifically indicated above     Objective:    BP 128/68 (BP Location: Left Arm, Patient Position: Sitting, Cuff Size: Normal)   Pulse (!) 105   Temp 97.7 F (36.5 C)   Ht 5' 10.25" (1.784 m)   Wt 189 lb 8 oz (86 kg)   SpO2 96%   BMI 27.00 kg/m   Wt Readings from Last 3 Encounters:  09/19/17 189 lb 8 oz (86 kg)  08/15/17 195 lb (88.5 kg)  07/10/17 186 lb 12 oz (84.7 kg)    Physical Exam  Constitutional: He is oriented to person, place, and time. He appears well-developed and well-nourished.  HENT:  Head: Normocephalic and atraumatic.  Neck: Neck supple.  Cardiovascular:  Normal rate and regular rhythm.   Pulmonary/Chest: Effort normal and breath sounds normal. He has no wheezes.  Abdominal: Soft. Bowel sounds are normal. There is no hepatosplenomegaly. There is no tenderness.  Musculoskeletal: He exhibits no edema.  Lymphadenopathy:    He has no cervical adenopathy.  Neurological: He is alert and oriented to person, place, and time.  Skin: Skin is warm and dry.  Psychiatric: He has a normal mood and  affect. His behavior is normal.  Vitals reviewed.       Assessment & Plan:   Encounter Diagnoses  Name Primary?  . Essential hypertension Yes  . Gastroesophageal reflux disease, esophagitis presence not specified   . Cigarette nicotine dependence without complication   . Prediabetes   . Hyperlipidemia, unspecified hyperlipidemia type   . Anemia, unspecified type      -urged pt to bring his meds to every appointment -rx ranitidine .  Pt reminded about lifestyle choices to help gerd including avoiding spicy food and not smokine -pt recently completed pylera for h pylori -pt to follow up 6 wk.  RTO sooner prn worsening or new symptoms

## 2017-09-25 ENCOUNTER — Ambulatory Visit: Payer: Self-pay | Admitting: Physician Assistant

## 2017-10-30 ENCOUNTER — Other Ambulatory Visit (HOSPITAL_COMMUNITY)
Admission: RE | Admit: 2017-10-30 | Discharge: 2017-10-30 | Disposition: A | Discharge status: Home / Self Care | Payer: Self-pay | Source: Ambulatory Visit | Attending: Physician Assistant | Admitting: Physician Assistant

## 2017-10-30 DIAGNOSIS — D649 Anemia, unspecified: Secondary | ICD-10-CM | POA: Insufficient documentation

## 2017-10-30 DIAGNOSIS — R7303 Prediabetes: Secondary | ICD-10-CM | POA: Insufficient documentation

## 2017-10-30 DIAGNOSIS — K219 Gastro-esophageal reflux disease without esophagitis: Secondary | ICD-10-CM | POA: Insufficient documentation

## 2017-10-30 DIAGNOSIS — E876 Hypokalemia: Secondary | ICD-10-CM

## 2017-10-30 DIAGNOSIS — I10 Essential (primary) hypertension: Secondary | ICD-10-CM | POA: Insufficient documentation

## 2017-10-30 DIAGNOSIS — E785 Hyperlipidemia, unspecified: Secondary | ICD-10-CM | POA: Insufficient documentation

## 2017-10-30 LAB — BASIC METABOLIC PANEL
Anion gap: 9 (ref 5–15)
BUN: 10 mg/dL (ref 6–20)
CO2: 26 mmol/L (ref 22–32)
CREATININE: 1.01 mg/dL (ref 0.61–1.24)
Calcium: 9.2 mg/dL (ref 8.9–10.3)
Chloride: 104 mmol/L (ref 101–111)
GFR calc Af Amer: >60 mL/min (ref >60)
GFR calc non Af Amer: >60 mL/min (ref >60)
Glucose, Bld: 234 mg/dL — ABNORMAL HIGH (ref 65–99)
POTASSIUM: 3.6 mmol/L (ref 3.5–5.1)
SODIUM: 139 mmol/L (ref 135–145)

## 2017-10-30 LAB — HEMOGLOBIN AND HEMATOCRIT, BLOOD
HEMATOCRIT: 34.8 % — AB (ref 39.0–52.0)
HEMOGLOBIN: 11.8 g/dL — AB (ref 13.0–17.0)

## 2017-10-31 ENCOUNTER — Other Ambulatory Visit: Payer: Self-pay | Admitting: Physician Assistant

## 2017-10-31 DIAGNOSIS — R7303 Prediabetes: Secondary | ICD-10-CM

## 2017-10-31 DIAGNOSIS — R739 Hyperglycemia, unspecified: Secondary | ICD-10-CM

## 2017-10-31 LAB — HEMOGLOBIN A1C
Hgb A1c MFr Bld: 8 % — ABNORMAL HIGH (ref 4.8–5.6)
Mean Plasma Glucose: 182.9 mg/dL

## 2017-11-01 ENCOUNTER — Ambulatory Visit: Payer: Self-pay | Admitting: Physician Assistant

## 2017-11-01 ENCOUNTER — Encounter: Payer: Self-pay | Admitting: Physician Assistant

## 2017-11-01 VITALS — BP 140/76 | HR 102 | Temp 97.9°F | Ht 70.25 in | Wt 191.8 lb

## 2017-11-01 DIAGNOSIS — K219 Gastro-esophageal reflux disease without esophagitis: Secondary | ICD-10-CM

## 2017-11-01 DIAGNOSIS — I10 Essential (primary) hypertension: Secondary | ICD-10-CM

## 2017-11-01 DIAGNOSIS — F17210 Nicotine dependence, cigarettes, uncomplicated: Secondary | ICD-10-CM

## 2017-11-01 DIAGNOSIS — D649 Anemia, unspecified: Secondary | ICD-10-CM

## 2017-11-01 DIAGNOSIS — E1165 Type 2 diabetes mellitus with hyperglycemia: Principal | ICD-10-CM

## 2017-11-01 DIAGNOSIS — IMO0001 Reserved for inherently not codable concepts without codable children: Secondary | ICD-10-CM

## 2017-11-01 DIAGNOSIS — E785 Hyperlipidemia, unspecified: Secondary | ICD-10-CM

## 2017-11-01 MED ORDER — METFORMIN HCL 1000 MG PO TABS: 1000.0000 mg | ORAL_TABLET | Freq: Two times a day (BID) | ORAL | 4 refills | Status: DC

## 2017-11-01 NOTE — Progress Notes (Signed)
BP 140/76 (BP Location: Left Arm, Patient Position: Sitting, Cuff Size: Normal)   Pulse (!) 102   Temp 97.9 F (36.6 C)   Ht 5' 10.25" (1.784 m)   Wt 191 lb 12 oz (87 kg)   SpO2 98%   BMI 27.32 kg/m    Subjective:    Patient ID: Robert PhiMarvin L Zucco, male    DOB: 30-Jun-1961, 56 y.o.   MRN: [id number]  HPI: Robert Lyons is a 56 y.o. male presenting on 11/01/2017 for Gastroesophageal Reflux   HPI   Pt finished his pylera.  He took it with omeprazole as directed.  He says his stomach still feels funny.   Pt finished it "a little while" ago    Relevant past medical, surgical, family and social history reviewed and updated as indicated. Interim medical history since our last visit reviewed. Allergies and medications reviewed and updated.   Current Outpatient Medications:  .  acetaminophen (TYLENOL) 500 MG tablet, Take 500-1,000 mg by mouth daily as needed., Disp: , Rfl:  .  lisinopril (PRINIVIL,ZESTRIL) 10 MG tablet, Take 1 tablet (10 mg total) by mouth daily., Disp: 30 tablet, Rfl: 1 .  metFORMIN (GLUCOPHAGE XR) 500 MG 24 hr tablet, Take 1 tablet (500 mg total) by mouth daily with breakfast., Disp: 30 tablet, Rfl: 1 .  ranitidine (ZANTAC) 300 MG tablet, Take 1 tablet (300 mg total) by mouth at bedtime. (Patient not taking: Reported on 11/01/2017), Disp: 30 tablet, Rfl: 2   Review of Systems  Constitutional: Negative for appetite change, chills, diaphoresis, fatigue, fever and unexpected weight change.  HENT: Negative for congestion, drooling, ear pain, facial swelling, hearing loss, mouth sores, sneezing, sore throat, trouble swallowing and voice change.   Eyes: Negative for pain, discharge, redness, itching and visual disturbance.  Respiratory: Negative for cough, choking, shortness of breath and wheezing.   Cardiovascular: Negative for chest pain, palpitations and leg swelling.  Gastrointestinal: Negative for abdominal pain, blood in stool, constipation, diarrhea and  vomiting.  Endocrine: Negative for cold intolerance, heat intolerance and polydipsia.  Genitourinary: Negative for decreased urine volume, dysuria and hematuria.  Musculoskeletal: Negative for arthralgias, back pain and gait problem.  Skin: Negative for rash.  Allergic/Immunologic: Negative for environmental allergies.  Neurological: Negative for seizures, syncope, light-headedness and headaches.  Hematological: Negative for adenopathy.  Psychiatric/Behavioral: Negative for agitation, dysphoric mood and suicidal ideas. The patient is not nervous/anxious.     Per HPI unless specifically indicated above     Objective:    BP 140/76 (BP Location: Left Arm, Patient Position: Sitting, Cuff Size: Normal)   Pulse (!) 102   Temp 97.9 F (36.6 C)   Ht 5' 10.25" (1.784 m)   Wt 191 lb 12 oz (87 kg)   SpO2 98%   BMI 27.32 kg/m   Wt Readings from Last 3 Encounters:  11/01/17 191 lb 12 oz (87 kg)  09/19/17 189 lb 8 oz (86 kg)  08/15/17 195 lb (88.5 kg)    Physical Exam  Constitutional: He is oriented to person, place, and time. He appears well-developed and well-nourished.  HENT:  Head: Normocephalic and atraumatic.  Neck: Neck supple.  Cardiovascular: Normal rate and regular rhythm.  Pulmonary/Chest: Effort normal and breath sounds normal. He has no wheezes.  Abdominal: Soft. Bowel sounds are normal. There is no hepatosplenomegaly. There is no tenderness.  Musculoskeletal: He exhibits no edema.  Lymphadenopathy:    He has no cervical adenopathy.  Neurological: He is alert and oriented to person,  place, and time.  Skin: Skin is warm and dry.  Psychiatric: He has a normal mood and affect. His behavior is normal.  Vitals reviewed.   Results for orders placed or performed during the hospital encounter of 10/30/17  Hemoglobin and hematocrit, blood  Result Value Ref Range   Hemoglobin 11.8 (L) 13.0 - 17.0 g/dL   HCT 34.734.8 (L) 42.539.0 - 95.652.0 %  Basic metabolic panel  Result Value Ref  Range   Sodium 139 135 - 145 mmol/L   Potassium 3.6 3.5 - 5.1 mmol/L   Chloride 104 101 - 111 mmol/L   CO2 26 22 - 32 mmol/L   Glucose, Bld 234 (H) 65 - 99 mg/dL   BUN 10 6 - 20 mg/dL   Creatinine, Ser 3.871.01 0.61 - 1.24 mg/dL   Calcium 9.2 8.9 - 56.410.3 mg/dL   GFR calc non Af Amer >60 >60 mL/min   GFR calc Af Amer >60 >60 mL/min   Anion gap 9 5 - 15  Hemoglobin A1c  Result Value Ref Range   Hgb A1c MFr Bld 8.0 (H) 4.8 - 5.6 %   Mean Plasma Glucose 182.9 mg/dL      Assessment & Plan:    Encounter Diagnoses  Name Primary?  [name]. Uncontrolled diabetes mellitus type 2 without complications (HCC) Yes  . Cigarette nicotine dependence without complication   . Gastroesophageal reflux disease, esophagitis presence not specified   . Essential hypertension   . Anemia, unspecified type   . Hyperlipidemia, unspecified hyperlipidemia type     -reviewed labs with pt -increase metformin to 1g bid.  Counseled pt to watch diabetic diet -restart ranitidine - refer for annual dm eye exam -pt to follow up in 3 months.  He is to RTO sooner if his GI symptoms worsen or for other problems

## 2017-11-01 NOTE — Patient Instructions (Signed)
- restart ranitidine -increase metformin   Diabetes Mellitus and Food It is important for you to manage your blood sugar (glucose) level. Your blood glucose level can be greatly affected by what you eat. Eating healthier foods in the appropriate amounts throughout the day at about the same time each day will help you control your blood glucose level. It can also help slow or prevent worsening of your diabetes mellitus. Healthy eating may even help you improve the level of your blood pressure and reach or maintain a healthy weight. General recommendations for healthful eating and cooking habits include:  Eating meals and snacks regularly. Avoid going long periods of time without eating to lose weight.  Eating a diet that consists mainly of plant-based foods, such as fruits, vegetables, nuts, legumes, and whole grains.  Using low-heat cooking methods, such as baking, instead of high-heat cooking methods, such as deep frying.  Work with your dietitian to make sure you understand how to use the Nutrition Facts information on food labels. How can food affect me? Carbohydrates Carbohydrates affect your blood glucose level more than any other type of food. Your dietitian will help you determine how many carbohydrates to eat at each meal and teach you how to count carbohydrates. Counting carbohydrates is important to keep your blood glucose at a healthy level, especially if you are using insulin or taking certain medicines for diabetes mellitus. Alcohol Alcohol can cause sudden decreases in blood glucose (hypoglycemia), especially if you use insulin or take certain medicines for diabetes mellitus. Hypoglycemia can be a life-threatening condition. Symptoms of hypoglycemia (sleepiness, dizziness, and disorientation) are similar to symptoms of having too much alcohol. If your health care provider has given you approval to drink alcohol, do so in moderation and use the following guidelines:  Women should  not have more than one drink per day, and men should not have more than two drinks per day. One drink is equal to: ? 12 oz of beer. ? 5 oz of wine. ? 1 oz of hard liquor.  Do not drink on an empty stomach.  Keep yourself hydrated. Have water, diet soda, or unsweetened iced tea.  Regular soda, juice, and other mixers might contain a lot of carbohydrates and should be counted.  What foods are not recommended? As you make food choices, it is important to remember that all foods are not the same. Some foods have fewer nutrients per serving than other foods, even though they might have the same number of calories or carbohydrates. It is difficult to get your body what it needs when you eat foods with fewer nutrients. Examples of foods that you should avoid that are high in calories and carbohydrates but low in nutrients include:  Trans fats (most processed foods list trans fats on the Nutrition Facts label).  Regular soda.  Juice.  Candy.  Sweets, such as cake, pie, doughnuts, and cookies.  Fried foods.  What foods can I eat? Eat nutrient-rich foods, which will nourish your body and keep you healthy. The food you should eat also will depend on several factors, including:  The calories you need.  The medicines you take.  Your weight.  Your blood glucose level.  Your blood pressure level.  Your cholesterol level.  You should eat a variety of foods, including:  Protein. ? Lean cuts of meat. ? Proteins low in saturated fats, such as fish, egg whites, and beans. Avoid processed meats.  Fruits and vegetables. ? Fruits and vegetables that may help  control blood glucose levels, such as apples, mangoes, and yams.  Dairy products. ? Choose fat-free or low-fat dairy products, such as milk, yogurt, and cheese.  Grains, bread, pasta, and rice. ? Choose whole grain products, such as multigrain bread, whole oats, and brown rice. These foods may help control blood  pressure.  Fats. ? Foods containing healthful fats, such as nuts, avocado, olive oil, canola oil, and fish.  Does everyone with diabetes mellitus have the same meal plan? Because every person with diabetes mellitus is different, there is not one meal plan that works for everyone. It is very important that you meet with a dietitian who will help you create a meal plan that is just right for you. This information is not intended to replace advice given to you by your health care provider. Make sure you discuss any questions you have with your health care provider. Document Released: 09/08/2005 Document Revised: 05/19/2016 Document Reviewed: 11/08/2013 [employer] Interactive Patient Education  2017 [employer].

## 2018-01-31 ENCOUNTER — Ambulatory Visit: Payer: Self-pay | Admitting: Physician Assistant

## 2018-02-12 ENCOUNTER — Encounter: Payer: Self-pay | Admitting: Physician Assistant

## 2018-02-22 ENCOUNTER — Encounter: Payer: Self-pay | Admitting: Physician Assistant

## 2018-02-22 ENCOUNTER — Ambulatory Visit: Payer: Self-pay | Admitting: Physician Assistant

## 2018-02-22 VITALS — BP 132/72 | HR 100 | Temp 97.9°F | Ht 70.25 in | Wt 197.8 lb

## 2018-02-22 DIAGNOSIS — E785 Hyperlipidemia, unspecified: Secondary | ICD-10-CM

## 2018-02-22 DIAGNOSIS — I10 Essential (primary) hypertension: Secondary | ICD-10-CM

## 2018-02-22 DIAGNOSIS — F17210 Nicotine dependence, cigarettes, uncomplicated: Secondary | ICD-10-CM

## 2018-02-22 DIAGNOSIS — IMO0001 Reserved for inherently not codable concepts without codable children: Secondary | ICD-10-CM

## 2018-02-22 DIAGNOSIS — Z9119 Patient's noncompliance with other medical treatment and regimen: Secondary | ICD-10-CM

## 2018-02-22 DIAGNOSIS — E1165 Type 2 diabetes mellitus with hyperglycemia: Principal | ICD-10-CM

## 2018-02-22 DIAGNOSIS — K219 Gastro-esophageal reflux disease without esophagitis: Secondary | ICD-10-CM

## 2018-02-22 DIAGNOSIS — D649 Anemia, unspecified: Secondary | ICD-10-CM

## 2018-02-22 DIAGNOSIS — Z91199 Patient's noncompliance with other medical treatment and regimen due to unspecified reason: Secondary | ICD-10-CM

## 2018-02-22 NOTE — Progress Notes (Signed)
BP 132/72 (BP Location: Left Arm, Patient Position: Sitting, Cuff Size: Normal)   Pulse 100   Temp 97.9 F (36.6 C)   Ht 5' 10.25" (1.784 m)   Wt 197 lb 12 oz (89.7 kg)   SpO2 97%   BMI 28.17 kg/m    Subjective:    Patient ID: Robert Lyons, male    DOB: [birth year], 59 y.o.   MRN: [id number]  HPI: Robert Lyons is a 57 y.o. male presenting on 02/22/2018 for Diabetes and Gastroesophageal Reflux   HPI   Pt is not taking any meds due to money issues.  Pt did not get his labs drawn  Pt is still smoking.   Relevant past medical, surgical, family and social history reviewed and updated as indicated. Interim medical history since our last visit reviewed. Allergies and medications reviewed and updated.  CURRENT MEDS: none  Review of Systems  Constitutional: Negative for appetite change, chills, diaphoresis, fatigue, fever and unexpected weight change.  HENT: Negative for congestion, dental problem, drooling, ear pain, facial swelling, hearing loss, mouth sores, sneezing, sore throat, trouble swallowing and voice change.   Eyes: Positive for pain, discharge and itching. Negative for redness and visual disturbance.  Respiratory: Negative for cough, choking, shortness of breath and wheezing.   Cardiovascular: Negative for chest pain, palpitations and leg swelling.  Gastrointestinal: Negative for abdominal pain, blood in stool, constipation, diarrhea and vomiting.  Endocrine: Negative for cold intolerance, heat intolerance and polydipsia.  Genitourinary: Negative for decreased urine volume, dysuria and hematuria.  Musculoskeletal: Negative for arthralgias, back pain and gait problem.  Skin: Negative for rash.  Allergic/Immunologic: Negative for environmental allergies.  Neurological: Positive for headaches. Negative for seizures, syncope and light-headedness.  Hematological: Negative for adenopathy.  Psychiatric/Behavioral: Negative for agitation, dysphoric mood and  suicidal ideas. The patient is not nervous/anxious.     Per HPI unless specifically indicated above     Objective:    BP 132/72 (BP Location: Left Arm, Patient Position: Sitting, Cuff Size: Normal)   Pulse 100   Temp 97.9 F (36.6 C)   Ht 5' 10.25" (1.784 m)   Wt 197 lb 12 oz (89.7 kg)   SpO2 97%   BMI 28.17 kg/m   Wt Readings from Last 3 Encounters:  02/22/18 197 lb 12 oz (89.7 kg)  11/01/17 191 lb 12 oz (87 kg)  09/19/17 189 lb 8 oz (86 kg)    Physical Exam  Constitutional: He is oriented to person, place, and time. He appears well-developed and well-nourished.  HENT:  Head: Normocephalic and atraumatic.  Neck: Neck supple.  Cardiovascular: Normal rate and regular rhythm.  Pulmonary/Chest: Effort normal and breath sounds normal. He has no wheezes.  Abdominal: Soft. Bowel sounds are normal. There is no hepatosplenomegaly. There is no tenderness.  Musculoskeletal: He exhibits no edema.  Lymphadenopathy:    He has no cervical adenopathy.  Neurological: He is alert and oriented to person, place, and time.  Skin: Skin is warm and dry.  Psychiatric: He has a normal mood and affect. His behavior is normal.  Vitals reviewed.        Assessment & Plan:   Encounter Diagnoses  Name Primary?  [name] Uncontrolled diabetes mellitus type 2 without complications (HCC) Yes  . Essential hypertension   . Gastroesophageal reflux disease, esophagitis presence not specified   . Anemia, unspecified type   . Cigarette nicotine dependence without complication   . Hyperlipidemia, unspecified hyperlipidemia type   . Personal history of  noncompliance with medical treatment, presenting hazards to health      -No labs at this tim -will try again to get pt signed up for [facility] -pt is encouraged to get back on meds ASAP -pt to follow up 3 months.  RTO sooner prn

## 2018-02-27 ENCOUNTER — Other Ambulatory Visit: Payer: Self-pay | Admitting: Physician Assistant

## 2018-02-27 MED ORDER — LISINOPRIL 10 MG PO TABS: 10.0000 mg | ORAL_TABLET | Freq: Every day | ORAL | 0 refills | Status: DC

## 2018-02-27 MED ORDER — METFORMIN HCL 1000 MG PO TABS: 1000.0000 mg | ORAL_TABLET | Freq: Two times a day (BID) | ORAL | 0 refills | Status: DC

## 2018-02-27 MED ORDER — RANITIDINE HCL 300 MG PO TABS: 300.0000 mg | ORAL_TABLET | Freq: Every day | ORAL | 2 refills | Status: DC

## 2018-05-22 ENCOUNTER — Encounter: Payer: Self-pay | Admitting: Physician Assistant

## 2018-05-22 ENCOUNTER — Ambulatory Visit: Payer: Self-pay | Admitting: Physician Assistant

## 2018-05-22 VITALS — BP 134/68 | HR 98 | Temp 97.3°F | Ht 70.25 in | Wt 189.2 lb

## 2018-05-22 DIAGNOSIS — I10 Essential (primary) hypertension: Secondary | ICD-10-CM

## 2018-05-22 DIAGNOSIS — Z9119 Patient's noncompliance with other medical treatment and regimen: Secondary | ICD-10-CM

## 2018-05-22 DIAGNOSIS — IMO0001 Reserved for inherently not codable concepts without codable children: Secondary | ICD-10-CM

## 2018-05-22 DIAGNOSIS — F17210 Nicotine dependence, cigarettes, uncomplicated: Secondary | ICD-10-CM

## 2018-05-22 DIAGNOSIS — D649 Anemia, unspecified: Secondary | ICD-10-CM

## 2018-05-22 DIAGNOSIS — K219 Gastro-esophageal reflux disease without esophagitis: Secondary | ICD-10-CM

## 2018-05-22 DIAGNOSIS — E1165 Type 2 diabetes mellitus with hyperglycemia: Secondary | ICD-10-CM

## 2018-05-22 DIAGNOSIS — Z91199 Patient's noncompliance with other medical treatment and regimen due to unspecified reason: Secondary | ICD-10-CM

## 2018-05-22 NOTE — Progress Notes (Signed)
BP 134/68 (BP Location: Right Arm, Patient Position: Sitting, Cuff Size: Normal)   Pulse 98   Temp (!) 97.3 F (36.3 C) (Other (Comment))   Ht 5' 10.25" (1.784 m)   Wt 189 lb 4 oz (85.8 kg)   SpO2 99%   BMI 26.96 kg/m    Subjective:    Patient ID: Robert Lyons, male    DOB: [birth year], 59 y.o.   MRN: [id number]  HPI: Robert Lyons is a 57 y.o. male presenting on 05/22/2018 for Diabetes and Hypertension   HPI   Pt is not taking any of his meds.  At his last OV when he wasn't taking any meds, he said it was because he couldn't afford them.  He was signed up for [facility] and he now gets all of his meds for free.  He says he doesn't know why, he just isn't taking them.    He has also not gotten his labs drawn.   He says he feels fine today and has no complaints  Relevant past medical, surgical, family and social history reviewed and updated as indicated. Interim medical history since our last visit reviewed. Allergies and medications reviewed and updated.   Current Outpatient Medications:  .  acetaminophen (TYLENOL) 500 MG tablet, Take 500-1,000 mg by mouth daily as needed., Disp: , Rfl:  .  lisinopril (PRINIVIL,ZESTRIL) 10 MG tablet, Take 1 tablet (10 mg total) by mouth daily. (Patient not taking: Reported on 05/22/2018), Disp: 90 tablet, Rfl: 0 .  metFORMIN (GLUCOPHAGE) 1000 MG tablet, Take 1 tablet (1,000 mg total) by mouth 2 (two) times daily with a meal. (Patient not taking: Reported on 05/22/2018), Disp: 180 tablet, Rfl: 0 .  ranitidine (ZANTAC) 300 MG tablet, Take 1 tablet (300 mg total) by mouth at bedtime. (Patient not taking: Reported on 05/22/2018), Disp: 90 tablet, Rfl: 2  Review of Systems  Constitutional: Negative for appetite change, chills, diaphoresis, fatigue, fever and unexpected weight change.  HENT: Negative for congestion, dental problem, drooling, ear pain, facial swelling, hearing loss, mouth sores, sneezing, sore throat, trouble swallowing and voice  change.   Eyes: Negative for pain, discharge, redness, itching and visual disturbance.  Respiratory: Negative for cough, choking, shortness of breath and wheezing.   Cardiovascular: Negative for chest pain, palpitations and leg swelling.  Gastrointestinal: Negative for abdominal pain, blood in stool, constipation, diarrhea and vomiting.  Endocrine: Negative for cold intolerance, heat intolerance and polydipsia.  Genitourinary: Negative for decreased urine volume, dysuria and hematuria.  Musculoskeletal: Negative for arthralgias, back pain and gait problem.  Skin: Negative for rash.  Allergic/Immunologic: Negative for environmental allergies.  Neurological: Negative for seizures, syncope, light-headedness and headaches.  Hematological: Negative for adenopathy.  Psychiatric/Behavioral: Negative for agitation, dysphoric mood and suicidal ideas. The patient is not nervous/anxious.     Per HPI unless specifically indicated above     Objective:    BP 134/68 (BP Location: Right Arm, Patient Position: Sitting, Cuff Size: Normal)   Pulse 98   Temp (!) 97.3 F (36.3 C) (Other (Comment))   Ht 5' 10.25" (1.784 m)   Wt 189 lb 4 oz (85.8 kg)   SpO2 99%   BMI 26.96 kg/m   Wt Readings from Last 3 Encounters:  05/22/18 189 lb 4 oz (85.8 kg)  02/22/18 197 lb 12 oz (89.7 kg)  11/01/17 191 lb 12 oz (87 kg)    Physical Exam  Constitutional: He is oriented to person, place, and time. He appears well-developed and well-nourished.  HENT:  Head: Normocephalic and atraumatic.  Pulmonary/Chest: Effort normal. No respiratory distress.  Neurological: He is alert and oriented to person, place, and time.  Skin: Skin is warm and dry.  Psychiatric: He has a normal mood and affect. His behavior is normal.  Nursing note and vitals reviewed.       Assessment & Plan:    Encounter Diagnoses  Name Primary?  . Personal history of noncompliance with medical treatment, presenting hazards to health Yes  .  Uncontrolled diabetes mellitus type 2 without complications (HCC)   . Essential hypertension   . Gastroesophageal reflux disease, esophagitis presence not specified   . Anemia, unspecified type   . Cigarette nicotine dependence without complication     -pt to get back on his meds -he is to get his labs drawn -follow up 2 weeks.  RTO sooner prn

## 2018-06-05 ENCOUNTER — Ambulatory Visit: Payer: Self-pay | Admitting: Physician Assistant

## 2018-06-07 ENCOUNTER — Ambulatory Visit: Payer: Self-pay | Admitting: Physician Assistant

## 2018-06-12 ENCOUNTER — Other Ambulatory Visit (HOSPITAL_COMMUNITY)
Admission: RE | Admit: 2018-06-12 | Discharge: 2018-06-12 | Disposition: A | Discharge status: Home / Self Care | Payer: Self-pay | Source: Ambulatory Visit | Attending: Physician Assistant | Admitting: Physician Assistant

## 2018-06-12 DIAGNOSIS — IMO0001 Reserved for inherently not codable concepts without codable children: Secondary | ICD-10-CM

## 2018-06-12 DIAGNOSIS — E785 Hyperlipidemia, unspecified: Secondary | ICD-10-CM

## 2018-06-12 DIAGNOSIS — D649 Anemia, unspecified: Secondary | ICD-10-CM

## 2018-06-12 DIAGNOSIS — I10 Essential (primary) hypertension: Secondary | ICD-10-CM

## 2018-06-12 DIAGNOSIS — K219 Gastro-esophageal reflux disease without esophagitis: Secondary | ICD-10-CM

## 2018-06-12 DIAGNOSIS — E1165 Type 2 diabetes mellitus with hyperglycemia: Secondary | ICD-10-CM | POA: Insufficient documentation

## 2018-06-12 LAB — HEMOGLOBIN A1C
HEMOGLOBIN A1C: 8.1 % — AB (ref 4.8–5.6)
MEAN PLASMA GLUCOSE: 185.77 mg/dL

## 2018-06-12 LAB — COMPREHENSIVE METABOLIC PANEL
ALT: 18 U/L (ref 17–63)
ANION GAP: 8 (ref 5–15)
AST: 22 U/L (ref 15–41)
Albumin: 4.1 g/dL (ref 3.5–5.0)
Alkaline Phosphatase: 66 U/L (ref 38–126)
BUN: 9 mg/dL (ref 6–20)
CHLORIDE: 107 mmol/L (ref 101–111)
CO2: 26 mmol/L (ref 22–32)
CREATININE: 0.91 mg/dL (ref 0.61–1.24)
Calcium: 9.2 mg/dL (ref 8.9–10.3)
Glucose, Bld: 211 mg/dL — ABNORMAL HIGH (ref 65–99)
Potassium: 3.8 mmol/L (ref 3.5–5.1)
SODIUM: 141 mmol/L (ref 135–145)
Total Bilirubin: 0.9 mg/dL (ref 0.3–1.2)
Total Protein: 7.3 g/dL (ref 6.5–8.1)

## 2018-06-12 LAB — LIPID PANEL
Cholesterol: 167 mg/dL (ref 0–200)
HDL: 38 mg/dL — AB (ref >40)
LDL Cholesterol: 96 mg/dL (ref 0–99)
TRIGLYCERIDES: 166 mg/dL — AB (ref <150)
Total CHOL/HDL Ratio: 4.4 RATIO
VLDL: 33 mg/dL (ref 0–40)

## 2018-06-12 LAB — HEMOGLOBIN AND HEMATOCRIT, BLOOD
HEMATOCRIT: 38.5 % — AB (ref 39.0–52.0)
Hemoglobin: 12.8 g/dL — ABNORMAL LOW (ref 13.0–17.0)

## 2018-06-13 ENCOUNTER — Ambulatory Visit: Payer: Self-pay | Admitting: Physician Assistant

## 2018-06-19 ENCOUNTER — Encounter: Payer: Self-pay | Admitting: Physician Assistant

## 2019-12-17 ENCOUNTER — Other Ambulatory Visit: Payer: Self-pay

## 2020-01-17 ENCOUNTER — Other Ambulatory Visit: Payer: Self-pay

## 2020-01-17 ENCOUNTER — Ambulatory Visit: Payer: HRSA Program | Attending: Internal Medicine

## 2020-01-17 DIAGNOSIS — Z20822 Contact with and (suspected) exposure to covid-19: Secondary | ICD-10-CM | POA: Diagnosis present

## 2020-01-18 LAB — NOVEL CORONAVIRUS, NAA: SARS-CoV-2, NAA: NOT DETECTED

## 2020-01-20 ENCOUNTER — Other Ambulatory Visit: Payer: Self-pay

## 2020-05-27 ENCOUNTER — Encounter: Payer: Self-pay | Admitting: Physician Assistant

## 2020-05-27 ENCOUNTER — Other Ambulatory Visit: Payer: Self-pay

## 2020-05-27 ENCOUNTER — Ambulatory Visit: Payer: Self-pay | Admitting: Physician Assistant

## 2020-05-27 VITALS — BP 136/66 | HR 88 | Temp 98.1°F | Ht 70.0 in | Wt 196.2 lb

## 2020-05-27 DIAGNOSIS — R079 Chest pain, unspecified: Secondary | ICD-10-CM

## 2020-05-27 DIAGNOSIS — R42 Dizziness and giddiness: Secondary | ICD-10-CM

## 2020-05-27 DIAGNOSIS — Z862 Personal history of diseases of the blood and blood-forming organs and certain disorders involving the immune mechanism: Secondary | ICD-10-CM

## 2020-05-27 DIAGNOSIS — Z125 Encounter for screening for malignant neoplasm of prostate: Secondary | ICD-10-CM

## 2020-05-27 DIAGNOSIS — F17200 Nicotine dependence, unspecified, uncomplicated: Secondary | ICD-10-CM

## 2020-05-27 DIAGNOSIS — Z8639 Personal history of other endocrine, nutritional and metabolic disease: Secondary | ICD-10-CM

## 2020-05-27 DIAGNOSIS — Z7689 Persons encountering health services in other specified circumstances: Secondary | ICD-10-CM

## 2020-05-27 DIAGNOSIS — I10 Essential (primary) hypertension: Secondary | ICD-10-CM

## 2020-05-27 MED ORDER — OMEPRAZOLE 40 MG PO CPDR: 40.0000 mg | DELAYED_RELEASE_CAPSULE | Freq: Every day | ORAL | 1 refills | Status: DC

## 2020-05-27 MED ORDER — NITROGLYCERIN 0.4 MG SL SUBL
0.4000 mg | SUBLINGUAL_TABLET | SUBLINGUAL | 3 refills | Status: DC | PRN
Start: 2020-05-27 — End: 2022-03-23

## 2020-05-27 MED ORDER — LISINOPRIL 10 MG PO TABS
10.0000 mg | ORAL_TABLET | Freq: Every day | ORAL | 1 refills | Status: DC
Start: 2020-05-27 — End: 2020-07-01

## 2020-05-27 NOTE — Progress Notes (Signed)
BP 136/66   Pulse 88   Temp 98.1 F (36.7 C)   Ht 5\' 10"  (1.778 m)   Wt 196 lb 4 oz (89 kg)   SpO2 96%   BMI 28.16 kg/m    Subjective:    Patient ID: , male    DOB: [birth year], 59 y.o.   MRN: [id number]  HPI: Robert Lyons is a 59 y.o. male presenting on 05/27/2020 for New Patient (Initial Visit)   HPI   Pt had a negative covid 19 screening questionnaire.    Pt is a 58yoM with history of DM, HTN, and dyslipidemia who is a returning pt.  He was last seen here in May 2019.  He has long history of noncompliance.   He says he is ready now to do what he needs to do to take care of himself.   Pt says he Sometimes gets light-headed, sometimes dizzy, sometimes HA.  Sometimes he also gets chest pains- usually with exertion.  Some SOB- usually with exertion.    He says the CP lasts 5-10 minutes- it goes away with rest.     Relevant past medical, surgical, family and social history reviewed and updated as indicated. Interim medical history since our last visit reviewed. Allergies and medications reviewed and updated.   CURRENT MEDS: None   Review of Systems  Per HPI unless specifically indicated above     Objective:    BP 136/66   Pulse 88   Temp 98.1 F (36.7 C)   Ht 5\' 10"  (1.778 m)   Wt 196 lb 4 oz (89 kg)   SpO2 96%   BMI 28.16 kg/m   Wt Readings from Last 3 Encounters:  05/27/20 196 lb 4 oz (89 kg)  05/22/18 189 lb 4 oz (85.8 kg)  02/22/18 197 lb 12 oz (89.7 kg)    Physical Exam Vitals reviewed.  Constitutional:      General: He is not in acute distress.    Appearance: He is well-developed. He is not toxic-appearing.  HENT:     Head: Normocephalic and atraumatic.  Eyes:     Conjunctiva/sclera: Conjunctivae normal.     Pupils: Pupils are equal, round, and reactive to light.  Neck:     Thyroid: No thyromegaly.  Cardiovascular:     Rate and Rhythm: Normal rate and regular rhythm.  Pulmonary:     Effort: Pulmonary effort is normal.      Breath sounds: Normal breath sounds. No wheezing or rales.  Abdominal:     General: Bowel sounds are normal.     Palpations: Abdomen is soft. There is no mass.     Tenderness: There is no abdominal tenderness.  Musculoskeletal:     Cervical back: Neck supple.     Right lower leg: No edema.     Left lower leg: No edema.  Lymphadenopathy:     Cervical: No cervical adenopathy.  Skin:    General: Skin is warm and dry.     Findings: No rash.  Neurological:     Mental Status: He is alert and oriented to person, place, and time.  Psychiatric:        Speech: Speech normal.        Behavior: Behavior normal.    EKG- NSR.  No acute st-t changes.  No changes compared with EKG from 02/24/14.        Assessment & Plan:    Encounter Diagnoses  Name Primary?  . Encounter to establish  care Yes  . Light headedness   . Essential hypertension   . Chest pain, unspecified type   . Tobacco use disorder   . History of diabetes mellitus   . History of hyperlipidemia   . Screening for prostate cancer   . History of anemia     -Update labs -rx lisinopril 10mg .  Will not rx beta-blocker which may exacerbate light-headedness. -Will await labs before rx DM meds -pt to Start asa 81mg  -he is given rx for ntg SL and counseled on how to use it properly including to go to ER if CP persists after 3 doses -rx omeprazole -Refer to cardsiology for CP in light of hx dm, htn, dyslipidemia and smoking.   He is given application for financial assistance through [facility] -pt to follow up 1 month.  He is to contact office for worsening or new symptoms

## 2020-05-27 NOTE — Patient Instructions (Signed)
Nitroglycerin sublingual tablets  What is this medicine? NITROGLYCERIN (nye troe GLI ser in) is a type of vasodilator. It relaxes blood vessels, increasing the blood and oxygen supply to your heart. This medicine is used to relieve chest pain caused by angina. It is also used to prevent chest pain before activities like climbing stairs, going outdoors in cold weather, or sexual activity. This medicine may be used for other purposes; ask your health care provider or pharmacist if you have questions. COMMON BRAND NAME(S): Nitroquick, Nitrostat, Nitrotab What should I tell my health care provider before I take this medicine? They need to know if you have any of these conditions:  anemia  head injury, recent stroke, or bleeding in the brain  liver disease  previous heart attack  an unusual or allergic reaction to nitroglycerin, other medicines, foods, dyes, or preservatives  pregnant or trying to get pregnant  breast-feeding How should I use this medicine? Take this medicine by mouth as needed. At the first sign of an angina attack (chest pain or tightness) place one tablet under your tongue. You can also take this medicine 5 to 10 minutes before an event likely to produce chest pain. Follow the directions on the prescription label. Let the tablet dissolve under the tongue. Do not swallow whole. Replace the dose if you accidentally swallow it. It will help if your mouth is not dry. Saliva around the tablet will help it to dissolve more quickly. Do not eat or drink, smoke or chew tobacco while a tablet is dissolving. If you are not better within 5 minutes after taking ONE dose of nitroglycerin, call 9-1-1 immediately to seek emergency medical care. Do not take more than 3 nitroglycerin tablets over 15 minutes. If you take this medicine often to relieve symptoms of angina, your doctor or health care professional may provide you with different instructions to manage your symptoms. If symptoms do not  go away after following these instructions, it is important to call 9-1-1 immediately. Do not take more than 3 nitroglycerin tablets over 15 minutes. Talk to your pediatrician regarding the use of this medicine in children. Special care may be needed. Overdosage: If you think you have taken too much of this medicine contact a poison control center or emergency room at once. NOTE: This medicine is only for you. Do not share this medicine with others. What if I miss a dose? This does not apply. This medicine is only used as needed. What may interact with this medicine? Do not take this medicine with any of the following medications:  certain migraine medicines like ergotamine and dihydroergotamine (DHE)  medicines used to treat erectile dysfunction like sildenafil, tadalafil, and vardenafil  riociguat This medicine may also interact with the following medications:  alteplase  aspirin  heparin  medicines for high blood pressure  medicines for mental depression  other medicines used to treat angina  phenothiazines like chlorpromazine, mesoridazine, prochlorperazine, thioridazine This list may not describe all possible interactions. Give your health care provider a list of all the medicines, herbs, non-prescription drugs, or dietary supplements you use. Also tell them if you smoke, drink alcohol, or use illegal drugs. Some items may interact with your medicine. What should I watch for while using this medicine? Tell your doctor or health care professional if you feel your medicine is no longer working. Keep this medicine with you at all times. Sit or lie down when you take your medicine to prevent falling if you feel dizzy or faint   after using it. Try to remain calm. This will help you to feel better faster. If you feel dizzy, take several deep breaths and lie down with your feet propped up, or bend forward with your head resting between your knees. You may get drowsy or dizzy. Do not  drive, use machinery, or do anything that needs mental alertness until you know how this drug affects you. Do not stand or sit up quickly, especially if you are an older patient. This reduces the risk of dizzy or fainting spells. Alcohol can make you more drowsy and dizzy. Avoid alcoholic drinks. Do not treat yourself for coughs, colds, or pain while you are taking this medicine without asking your doctor or health care professional for advice. Some ingredients may increase your blood pressure. What side effects may I notice from receiving this medicine? Side effects that you should report to your doctor or health care professional as soon as possible:  blurred vision  dry mouth  skin rash  sweating  the feeling of extreme pressure in the head  unusually weak or tired Side effects that usually do not require medical attention (report to your doctor or health care professional if they continue or are bothersome):  flushing of the face or neck  headache  irregular heartbeat, palpitations  nausea, vomiting This list may not describe all possible side effects. Call your doctor for medical advice about side effects. You may report side effects to FDA at 1-800-FDA-1088. Where should I keep my medicine? Keep out of the reach of children. Store at room temperature between 20 and 25 degrees C (68 and 77 degrees F). Store in original container. Protect from light and moisture. Keep tightly closed. Throw away any unused medicine after the expiration date. NOTE: This sheet is a summary. It may not cover all possible information. If you have questions about this medicine, talk to your doctor, pharmacist, or health care provider.  2020 Elsevier/Gold Standard (2013-10-10 17:57:36)  

## 2020-05-28 ENCOUNTER — Other Ambulatory Visit (HOSPITAL_COMMUNITY)
Admission: RE | Admit: 2020-05-28 | Discharge: 2020-05-28 | Disposition: A | Discharge status: Home / Self Care | Payer: Self-pay | Source: Ambulatory Visit | Attending: Physician Assistant | Admitting: Physician Assistant

## 2020-05-28 DIAGNOSIS — I10 Essential (primary) hypertension: Secondary | ICD-10-CM | POA: Insufficient documentation

## 2020-05-28 DIAGNOSIS — Z125 Encounter for screening for malignant neoplasm of prostate: Secondary | ICD-10-CM | POA: Insufficient documentation

## 2020-05-28 DIAGNOSIS — Z862 Personal history of diseases of the blood and blood-forming organs and certain disorders involving the immune mechanism: Secondary | ICD-10-CM | POA: Insufficient documentation

## 2020-05-28 DIAGNOSIS — Z8639 Personal history of other endocrine, nutritional and metabolic disease: Secondary | ICD-10-CM | POA: Insufficient documentation

## 2020-05-28 DIAGNOSIS — R42 Dizziness and giddiness: Secondary | ICD-10-CM | POA: Insufficient documentation

## 2020-05-28 LAB — COMPREHENSIVE METABOLIC PANEL
ALT: 21 U/L (ref 0–44)
AST: 23 U/L (ref 15–41)
Albumin: 4 g/dL (ref 3.5–5.0)
Alkaline Phosphatase: 68 U/L (ref 38–126)
Anion gap: 10 (ref 5–15)
BUN: 7 mg/dL (ref 6–20)
CO2: 26 mmol/L (ref 22–32)
Calcium: 9 mg/dL (ref 8.9–10.3)
Chloride: 101 mmol/L (ref 98–111)
Creatinine, Ser: 0.97 mg/dL (ref 0.61–1.24)
GFR calc Af Amer: >60 mL/min (ref >60)
GFR calc non Af Amer: >60 mL/min (ref >60)
Glucose, Bld: 254 mg/dL — ABNORMAL HIGH (ref 70–99)
Potassium: 3.2 mmol/L — ABNORMAL LOW (ref 3.5–5.1)
Sodium: 137 mmol/L (ref 135–145)
Total Bilirubin: 0.7 mg/dL (ref 0.3–1.2)
Total Protein: 7 g/dL (ref 6.5–8.1)

## 2020-05-28 LAB — CBC
HCT: 38.1 % — ABNORMAL LOW (ref 39.0–52.0)
Hemoglobin: 12.6 g/dL — ABNORMAL LOW (ref 13.0–17.0)
MCH: 30.3 pg (ref 26.0–34.0)
MCHC: 33.1 g/dL (ref 30.0–36.0)
MCV: 91.6 fL (ref 80.0–100.0)
Platelets: 232 10*3/uL (ref 150–400)
RBC: 4.16 MIL/uL — ABNORMAL LOW (ref 4.22–5.81)
RDW: 12.1 % (ref 11.5–15.5)
WBC: 6.3 10*3/uL (ref 4.0–10.5)
nRBC: 0 % (ref 0.0–0.2)

## 2020-05-28 LAB — LIPID PANEL
Cholesterol: 155 mg/dL (ref 0–200)
HDL: 38 mg/dL — ABNORMAL LOW (ref >40)
LDL Cholesterol: 85 mg/dL (ref 0–99)
Total CHOL/HDL Ratio: 4.1 RATIO
Triglycerides: 159 mg/dL — ABNORMAL HIGH (ref <150)
VLDL: 32 mg/dL (ref 0–40)

## 2020-05-28 LAB — PSA: Prostatic Specific Antigen: 0.28 ng/mL (ref 0.00–4.00)

## 2020-05-28 LAB — HEMOGLOBIN A1C
Hgb A1c MFr Bld: 7.8 % — ABNORMAL HIGH (ref 4.8–5.6)
Mean Plasma Glucose: 177.16 mg/dL

## 2020-07-01 ENCOUNTER — Other Ambulatory Visit: Payer: Self-pay | Admitting: Physician Assistant

## 2020-07-01 ENCOUNTER — Encounter: Payer: Self-pay | Admitting: Physician Assistant

## 2020-07-01 ENCOUNTER — Ambulatory Visit: Payer: Self-pay | Admitting: Physician Assistant

## 2020-07-01 VITALS — BP 136/70 | HR 85 | Temp 97.9°F | Ht 70.0 in | Wt 197.8 lb

## 2020-07-01 DIAGNOSIS — E876 Hypokalemia: Secondary | ICD-10-CM

## 2020-07-01 DIAGNOSIS — D649 Anemia, unspecified: Secondary | ICD-10-CM

## 2020-07-01 DIAGNOSIS — Z1211 Encounter for screening for malignant neoplasm of colon: Secondary | ICD-10-CM

## 2020-07-01 DIAGNOSIS — E1165 Type 2 diabetes mellitus with hyperglycemia: Secondary | ICD-10-CM

## 2020-07-01 DIAGNOSIS — I10 Essential (primary) hypertension: Secondary | ICD-10-CM

## 2020-07-01 DIAGNOSIS — K219 Gastro-esophageal reflux disease without esophagitis: Secondary | ICD-10-CM

## 2020-07-01 DIAGNOSIS — F17200 Nicotine dependence, unspecified, uncomplicated: Secondary | ICD-10-CM

## 2020-07-01 MED ORDER — POTASSIUM CHLORIDE ER 10 MEQ PO TBCR: 10.0000 meq | EXTENDED_RELEASE_TABLET | Freq: Two times a day (BID) | ORAL | 0 refills | Status: DC

## 2020-07-01 MED ORDER — METFORMIN HCL 1000 MG PO TABS: 1000.0000 mg | ORAL_TABLET | Freq: Two times a day (BID) | ORAL | 0 refills | Status: DC

## 2020-07-01 MED ORDER — LISINOPRIL 20 MG PO TABS: 20.0000 mg | ORAL_TABLET | Freq: Every day | ORAL | 0 refills | Status: DC

## 2020-07-01 MED ORDER — OMEPRAZOLE 40 MG PO CPDR: 40.0000 mg | DELAYED_RELEASE_CAPSULE | Freq: Every day | ORAL | 0 refills | Status: DC

## 2020-07-01 MED ORDER — LISINOPRIL 20 MG PO TABS
20.0000 mg | ORAL_TABLET | Freq: Every day | ORAL | 0 refills | Status: DC
Start: 2020-07-01 — End: 2020-07-01

## 2020-07-01 NOTE — Progress Notes (Signed)
BP 136/70    Pulse 85    Temp 97.9 F (36.6 C)    Ht 5\' 10"  (1.778 m)    Wt 197 lb 12.8 oz (89.7 kg)    SpO2 97%    BMI 28.38 kg/m    Subjective:    Patient ID: , male    DOB: 1961/12/12, 59 y.o.   MRN: [id number]  HPI: Robert Lyons is a 59 y.o. male presenting on 07/01/2020 for Follow-up   HPI  Pt had a negative covid 19 screening questionnaire.    Pt is a 58yoM who recently re-established care.    Pt says he is no longer having chest pains.  He has not used the NTG.   He says his light-headedness has also gone away.    Pt says he is doing well today.  He has not yet gotten the covid vaccination.     Relevant past medical, surgical, family and social history reviewed and updated as indicated. Interim medical history since our last visit reviewed. Allergies and medications reviewed and updated.    Current Outpatient Medications:    acetaminophen (TYLENOL) 500 MG tablet, Take 500-1,000 mg by mouth daily as needed., Disp: , Rfl:    lisinopril (ZESTRIL) 10 MG tablet, Take 1 tablet (10 mg total) by mouth daily., Disp: 30 tablet, Rfl: 1   nitroGLYCERIN (NITROSTAT) 0.4 MG SL tablet, Place 1 tablet (0.4 mg total) under the tongue every 5 (five) minutes as needed for chest pain., Disp: 25 tablet, Rfl: 3   omeprazole (PRILOSEC) 40 MG capsule, Take 1 capsule (40 mg total) by mouth daily., Disp: 30 capsule, Rfl: 1   Review of Systems  Per HPI unless specifically indicated above     Objective:    BP 136/70    Pulse 85    Temp 97.9 F (36.6 C)    Ht 5\' 10"  (1.778 m)    Wt 197 lb 12.8 oz (89.7 kg)    SpO2 97%    BMI 28.38 kg/m   Wt Readings from Last 3 Encounters:  07/01/20 197 lb 12.8 oz (89.7 kg)  05/27/20 196 lb 4 oz (89 kg)  05/22/18 189 lb 4 oz (85.8 kg)    Physical Exam Vitals reviewed.  Constitutional:      General: He is not in acute distress.    Appearance: He is well-developed.  HENT:     Head: Normocephalic and atraumatic.   Cardiovascular:     Rate and Rhythm: Normal rate and regular rhythm.  Pulmonary:     Effort: Pulmonary effort is normal.     Breath sounds: Normal breath sounds. No wheezing.  Abdominal:     General: Bowel sounds are normal.     Palpations: Abdomen is soft.     Tenderness: There is no abdominal tenderness.  Musculoskeletal:     Cervical back: Neck supple.     Right lower leg: No edema.     Left lower leg: No edema.  Lymphadenopathy:     Cervical: No cervical adenopathy.  Skin:    General: Skin is warm and dry.  Neurological:     Mental Status: He is alert and oriented to person, place, and time.  Psychiatric:        Behavior: Behavior normal.     Results for orders placed or performed during the hospital encounter of 05/28/20  PSA  Result Value Ref Range   Prostatic Specific Antigen 0.28 0.00 - 4.00 ng/mL  CBC  Result Value Ref Range   WBC 6.3 4.0 - 10.5 K/uL   RBC 4.16 (L) 4.22 - 5.81 MIL/uL   Hemoglobin 12.6 (L) 13.0 - 17.0 g/dL   HCT 35.5 (L) 39 - 52 %   MCV 91.6 80.0 - 100.0 fL   MCH 30.3 26.0 - 34.0 pg   MCHC 33.1 30.0 - 36.0 g/dL   RDW 73.2 20.2 - 54.2 %   Platelets 232 150 - 400 K/uL   nRBC 0.0 0.0 - 0.2 %  Lipid panel  Result Value Ref Range   Cholesterol 155 0 - 200 mg/dL   Triglycerides 706 (H) <150 mg/dL   HDL 38 (L) >23 mg/dL   Total CHOL/HDL Ratio 4.1 RATIO   VLDL 32 0 - 40 mg/dL   LDL Cholesterol 85 0 - 99 mg/dL  Comprehensive metabolic panel  Result Value Ref Range   Sodium 137 135 - 145 mmol/L   Potassium 3.2 (L) 3.5 - 5.1 mmol/L   Chloride 101 98 - 111 mmol/L   CO2 26 22 - 32 mmol/L   Glucose, Bld 254 (H) 70 - 99 mg/dL   BUN 7 6 - 20 mg/dL   Creatinine, Ser 7.62 0.61 - 1.24 mg/dL   Calcium 9.0 8.9 - 83.1 mg/dL   Total Protein 7.0 6.5 - 8.1 g/dL   Albumin 4.0 3.5 - 5.0 g/dL   AST 23 15 - 41 U/L   ALT 21 0 - 44 U/L   Alkaline Phosphatase 68 38 - 126 U/L   Total Bilirubin 0.7 0.3 - 1.2 mg/dL   GFR calc non Af Amer >60 >60 mL/min   GFR  calc Af Amer >60 >60 mL/min   Anion gap 10 5 - 15  Hemoglobin A1c  Result Value Ref Range   Hgb A1c MFr Bld 7.8 (H) 4.8 - 5.6 %   Mean Plasma Glucose 177.16 mg/dL      Assessment & Plan:    Encounter Diagnoses  Name Primary?   Essential hypertension Yes   Uncontrolled type 2 diabetes mellitus with hyperglycemia (HCC)    Anemia, unspecified type    Tobacco use disorder    Screening for colon cancer    Hypokalemia    Gastroesophageal reflux disease, unspecified whether esophagitis present     -reviewed labs with pt  -Restart metformin 1g bid.  Pt counseled to Watch diabetic diet.   -Increase lisinopril to 20mg  daily -pt to Continue omeprazole -rx K+   For one week.  Recheck labs when finished.   Pt says he will remember to get the blood test done and does not need a reminder call -Refer for DM eye exam -Gave iFOBT for colon cancer screening -Foot exam updated -pt encouraged to Get covid vaccination -pt to follow up 3 months.  He is to contact office sooner prn

## 2020-07-09 LAB — IFOBT (OCCULT BLOOD): IFOBT: NEGATIVE

## 2020-09-07 ENCOUNTER — Other Ambulatory Visit: Payer: Self-pay | Admitting: Student

## 2020-09-07 DIAGNOSIS — Z23 Encounter for immunization: Secondary | ICD-10-CM

## 2020-09-23 ENCOUNTER — Other Ambulatory Visit: Payer: Self-pay | Admitting: Physician Assistant

## 2020-09-23 DIAGNOSIS — I10 Essential (primary) hypertension: Secondary | ICD-10-CM

## 2020-09-23 DIAGNOSIS — E1165 Type 2 diabetes mellitus with hyperglycemia: Secondary | ICD-10-CM

## 2020-10-05 ENCOUNTER — Encounter: Payer: Self-pay | Admitting: Physician Assistant

## 2020-10-05 ENCOUNTER — Ambulatory Visit: Payer: Self-pay | Admitting: Physician Assistant

## 2020-10-05 ENCOUNTER — Other Ambulatory Visit: Payer: Self-pay

## 2020-10-05 VITALS — BP 140/70 | HR 94 | Temp 98.2°F | Ht 70.0 in | Wt 196.0 lb

## 2020-10-05 DIAGNOSIS — Z9119 Patient's noncompliance with other medical treatment and regimen: Secondary | ICD-10-CM

## 2020-10-05 DIAGNOSIS — I10 Essential (primary) hypertension: Secondary | ICD-10-CM

## 2020-10-05 DIAGNOSIS — E1165 Type 2 diabetes mellitus with hyperglycemia: Secondary | ICD-10-CM

## 2020-10-05 DIAGNOSIS — F17200 Nicotine dependence, unspecified, uncomplicated: Secondary | ICD-10-CM

## 2020-10-05 DIAGNOSIS — Z91199 Patient's noncompliance with other medical treatment and regimen due to unspecified reason: Secondary | ICD-10-CM

## 2020-10-05 MED ORDER — METOPROLOL TARTRATE 50 MG PO TABS
50.0000 mg | ORAL_TABLET | Freq: Two times a day (BID) | ORAL | 1 refills | Status: DC
Start: 2020-10-05 — End: 2020-10-27

## 2020-10-05 NOTE — Progress Notes (Signed)
BP 140/70   Pulse 94   Temp 98.2 F (36.8 C)   Ht 5\' 10"  (1.778 m)   Wt 196 lb (88.9 kg)   SpO2 96%   BMI 28.12 kg/m    Subjective:    Patient ID: , male    DOB: 12/03/1961, 59 y.o.   MRN: [id number]  HPI: Robert Lyons is a 59 y.o. male presenting on 10/05/2020 for Hypertension and Diabetes   HPI   Pt had a negative covid 19 screening questionnaire.   Pt is a 58yoM with follow up for DM and HTN.  He Didn't get labs done.  He didn't get his labs done in July to recheck K+ either.   He reports Diarrhea that Started Friday.  He says it is easing up/getting better.   No fever or symptoms other than the diarrhea.        Relevant past medical, surgical, family and social history reviewed and updated as indicated. Interim medical history since our last visit reviewed. Allergies and medications reviewed and updated.   Current Outpatient Medications:  .  acetaminophen (TYLENOL) 500 MG tablet, Take 500-1,000 mg by mouth daily as needed., Disp: , Rfl:  .  lisinopril (ZESTRIL) 20 MG tablet, Take 1 tablet (20 mg total) by mouth daily., Disp: 90 tablet, Rfl: 0 .  metFORMIN (GLUCOPHAGE) 1000 MG tablet, Take 1 tablet (1,000 mg total) by mouth 2 (two) times daily with a meal. (Patient taking differently: Take 1,000 mg by mouth daily. ), Disp: 180 tablet, Rfl: 0 .  nitroGLYCERIN (NITROSTAT) 0.4 MG SL tablet, Place 1 tablet (0.4 mg total) under the tongue every 5 (five) minutes as needed for chest pain., Disp: 25 tablet, Rfl: 3 .  omeprazole (PRILOSEC) 40 MG capsule, Take 1 capsule (40 mg total) by mouth daily., Disp: 90 capsule, Rfl: 0 .  metoprolol tartrate (LOPRESSOR) 50 MG tablet, Take 1 tablet (50 mg total) by mouth 2 (two) times daily., Disp: 60 tablet, Rfl: 1   Review of Systems  Per HPI unless specifically indicated above     Objective:    BP 140/70   Pulse 94   Temp 98.2 F (36.8 C)   Ht 5\' 10"  (1.778 m)   Wt 196 lb (88.9 kg)   SpO2 96%    BMI 28.12 kg/m   Wt Readings from Last 3 Encounters:  10/05/20 196 lb (88.9 kg)  07/01/20 197 lb 12.8 oz (89.7 kg)  05/27/20 196 lb 4 oz (89 kg)    Physical Exam Vitals reviewed.  Constitutional:      Appearance: He is well-developed.  HENT:     Head: Normocephalic and atraumatic.  Cardiovascular:     Rate and Rhythm: Normal rate and regular rhythm.  Pulmonary:     Effort: Pulmonary effort is normal.     Breath sounds: Normal breath sounds. No wheezing.  Abdominal:     General: Bowel sounds are normal.     Palpations: Abdomen is soft.     Tenderness: There is no abdominal tenderness.  Musculoskeletal:     Cervical back: Neck supple.  Lymphadenopathy:     Cervical: No cervical adenopathy.  Skin:    General: Skin is warm and dry.  Neurological:     Mental Status: He is alert and oriented to person, place, and time.  Psychiatric:        Behavior: Behavior normal.            Assessment & Plan:  Encounter Diagnoses  Name Primary?  . Essential hypertension Yes  . Uncontrolled type 2 diabetes mellitus with hyperglycemia (HCC)   . Tobacco use disorder   . Personal history of noncompliance with medical treatment, presenting hazards to health       -pt to get labs drawn.  Discussed with pt the importance of monitoring, particularly his K+.  He is strongly encouraged to get it drawn either when he leaves office today or first thing tomorrow morning -he has DM eye exam in November.  Pt is reminded of no-show policy to DM eye exams -pt will be seen for follow up in 3 weeks to recheck his BP and review labs.  He is to contact office sooner prn

## 2020-10-05 NOTE — Patient Instructions (Addendum)
[facility] [address] [place] [address] [phone] Wed. Nov. 10 2021 10:40 am

## 2020-10-06 ENCOUNTER — Other Ambulatory Visit (HOSPITAL_COMMUNITY)
Admission: RE | Admit: 2020-10-06 | Discharge: 2020-10-06 | Disposition: A | Discharge status: Home / Self Care | Payer: Self-pay | Source: Ambulatory Visit | Attending: Physician Assistant | Admitting: Physician Assistant

## 2020-10-06 ENCOUNTER — Other Ambulatory Visit: Payer: Self-pay

## 2020-10-06 DIAGNOSIS — E1165 Type 2 diabetes mellitus with hyperglycemia: Secondary | ICD-10-CM | POA: Insufficient documentation

## 2020-10-06 DIAGNOSIS — I10 Essential (primary) hypertension: Secondary | ICD-10-CM | POA: Insufficient documentation

## 2020-10-06 LAB — COMPREHENSIVE METABOLIC PANEL
ALT: 22 U/L (ref 0–44)
AST: 27 U/L (ref 15–41)
Albumin: 3.9 g/dL (ref 3.5–5.0)
Alkaline Phosphatase: 60 U/L (ref 38–126)
Anion gap: 10 (ref 5–15)
BUN: 10 mg/dL (ref 6–20)
CO2: 25 mmol/L (ref 22–32)
Calcium: 9.2 mg/dL (ref 8.9–10.3)
Chloride: 103 mmol/L (ref 98–111)
Creatinine, Ser: 0.97 mg/dL (ref 0.61–1.24)
GFR, Estimated: >60 mL/min (ref >60)
Glucose, Bld: 224 mg/dL — ABNORMAL HIGH (ref 70–99)
Potassium: 3.4 mmol/L — ABNORMAL LOW (ref 3.5–5.1)
Sodium: 138 mmol/L (ref 135–145)
Total Bilirubin: 0.7 mg/dL (ref 0.3–1.2)
Total Protein: 6.9 g/dL (ref 6.5–8.1)

## 2020-10-06 LAB — HEMOGLOBIN A1C
Hgb A1c MFr Bld: 8.4 % — ABNORMAL HIGH (ref 4.8–5.6)
Mean Plasma Glucose: 194.38 mg/dL

## 2020-10-06 LAB — LIPID PANEL
Cholesterol: 180 mg/dL (ref 0–200)
HDL: 39 mg/dL — ABNORMAL LOW (ref >40)
LDL Cholesterol: 86 mg/dL (ref 0–99)
Total CHOL/HDL Ratio: 4.6 RATIO
Triglycerides: 273 mg/dL — ABNORMAL HIGH (ref <150)
VLDL: 55 mg/dL — ABNORMAL HIGH (ref 0–40)

## 2020-10-07 LAB — MICROALBUMIN, URINE: Microalb, Ur: 15.4 ug/mL — ABNORMAL HIGH

## 2020-10-27 ENCOUNTER — Ambulatory Visit: Payer: Self-pay | Admitting: Physician Assistant

## 2020-10-27 ENCOUNTER — Encounter: Payer: Self-pay | Admitting: Physician Assistant

## 2020-10-27 VITALS — BP 170/90 | HR 77 | Temp 97.5°F | Ht 70.0 in | Wt 199.1 lb

## 2020-10-27 DIAGNOSIS — F17200 Nicotine dependence, unspecified, uncomplicated: Secondary | ICD-10-CM

## 2020-10-27 DIAGNOSIS — I10 Essential (primary) hypertension: Secondary | ICD-10-CM

## 2020-10-27 DIAGNOSIS — R079 Chest pain, unspecified: Secondary | ICD-10-CM

## 2020-10-27 DIAGNOSIS — Z91199 Patient's noncompliance with other medical treatment and regimen due to unspecified reason: Secondary | ICD-10-CM

## 2020-10-27 DIAGNOSIS — Z9119 Patient's noncompliance with other medical treatment and regimen: Secondary | ICD-10-CM

## 2020-10-27 DIAGNOSIS — E1165 Type 2 diabetes mellitus with hyperglycemia: Secondary | ICD-10-CM

## 2020-10-27 MED ORDER — METOPROLOL TARTRATE 50 MG PO TABS: 50.0000 mg | ORAL_TABLET | Freq: Two times a day (BID) | ORAL | 1 refills | Status: DC

## 2020-10-27 MED ORDER — OMEPRAZOLE 40 MG PO CPDR: 40.0000 mg | DELAYED_RELEASE_CAPSULE | Freq: Every day | ORAL | 0 refills | Status: DC

## 2020-10-27 MED ORDER — METFORMIN HCL 500 MG PO TABS: 500.0000 mg | ORAL_TABLET | Freq: Two times a day (BID) | ORAL | 1 refills | Status: DC

## 2020-10-27 MED ORDER — LISINOPRIL 20 MG PO TABS: 20.0000 mg | ORAL_TABLET | Freq: Every day | ORAL | 0 refills | Status: DC

## 2020-10-27 MED ORDER — SITAGLIPTIN PHOSPHATE 100 MG PO TABS: 100.0000 mg | ORAL_TABLET | Freq: Every day | ORAL | 1 refills | Status: DC

## 2020-10-27 NOTE — Progress Notes (Signed)
BP (!) 170/90   Pulse 77   Temp (!) 97.5 F (36.4 C)   Ht 5\' 10"  (1.778 m)   Wt 199 lb 1.6 oz (90.3 kg)   SpO2 97%   BMI 28.57 kg/m    Subjective:    Patient ID: , male    DOB: 1961/05/31, 59 y.o.   MRN: [id number]  HPI: Robert Lyons is a 59 y.o. male presenting on 10/27/2020 for Hypertension   HPI   Pt had a negative covid 19 screening questionnaire.   Pt is 59yoM with appointment to follow up HTN.    Pt not taking medications as prescribed.   He says he is having chest pains again.  Typically with exertion.  Pt was seen for this on 05/27/20 and had a normal EKG at that time.   Relevant past medical, surgical, family and social history reviewed and updated as indicated. Interim medical history since our last visit reviewed. Allergies and medications reviewed and updated.   Current Outpatient Medications:  .  acetaminophen (TYLENOL) 500 MG tablet, Take 500-1,000 mg by mouth daily as needed., Disp: , Rfl:  .  lisinopril (ZESTRIL) 10 MG tablet, Take 10 mg by mouth daily., Disp: , Rfl:  .  metFORMIN (GLUCOPHAGE) 1000 MG tablet, Take 1 tablet (1,000 mg total) by mouth 2 (two) times daily with a meal. (Patient taking differently: Take 1,000 mg by mouth daily. ), Disp: 180 tablet, Rfl: 0 .  metoprolol tartrate (LOPRESSOR) 50 MG tablet, Take 1 tablet (50 mg total) by mouth 2 (two) times daily., Disp: 60 tablet, Rfl: 1 .  nitroGLYCERIN (NITROSTAT) 0.4 MG SL tablet, Place 1 tablet (0.4 mg total) under the tongue every 5 (five) minutes as needed for chest pain., Disp: 25 tablet, Rfl: 3 .  omeprazole (PRILOSEC) 40 MG capsule, Take 1 capsule (40 mg total) by mouth daily., Disp: 90 capsule, Rfl: 0 .  lisinopril (ZESTRIL) 20 MG tablet, Take 1 tablet (20 mg total) by mouth daily. (Patient not taking: Reported on 10/27/2020), Disp: 90 tablet, Rfl: 0    Review of Systems  Per HPI unless specifically indicated above     Objective:    BP (!) 170/90   Pulse 77    Temp (!) 97.5 F (36.4 C)   Ht 5\' 10"  (1.778 m)   Wt 199 lb 1.6 oz (90.3 kg)   SpO2 97%   BMI 28.57 kg/m   Wt Readings from Last 3 Encounters:  10/27/20 199 lb 1.6 oz (90.3 kg)  10/05/20 196 lb (88.9 kg)  07/01/20 197 lb 12.8 oz (89.7 kg)    Physical Exam Vitals reviewed.  Constitutional:      General: He is not in acute distress.    Appearance: He is well-developed.  HENT:     Head: Normocephalic and atraumatic.  Cardiovascular:     Rate and Rhythm: Normal rate and regular rhythm.  Pulmonary:     Effort: Pulmonary effort is normal.     Breath sounds: Normal breath sounds. No wheezing.  Abdominal:     General: Bowel sounds are normal.     Palpations: Abdomen is soft.     Tenderness: There is no abdominal tenderness.  Musculoskeletal:     Cervical back: Neck supple.     Right lower leg: No edema.     Left lower leg: No edema.  Lymphadenopathy:     Cervical: No cervical adenopathy.  Skin:    General: Skin is warm and dry.  Neurological:     Mental Status: He is alert and oriented to person, place, and time.  Psychiatric:        Behavior: Behavior normal.     Results for orders placed or performed during the hospital encounter of 10/06/20  Lipid panel  Result Value Ref Range   Cholesterol 180 0 - 200 mg/dL   Triglycerides 564 (H) <150 mg/dL   HDL 39 (L) >33 mg/dL   Total CHOL/HDL Ratio 4.6 RATIO   VLDL 55 (H) 0 - 40 mg/dL   LDL Cholesterol 86 0 - 99 mg/dL  Hemoglobin I9J  Result Value Ref Range   Hgb A1c MFr Bld 8.4 (H) 4.8 - 5.6 %   Mean Plasma Glucose 194.38 mg/dL  Microalbumin, urine  Result Value Ref Range   Microalb, Ur 15.4 (H) Not Estab. ug/mL  Comprehensive metabolic panel  Result Value Ref Range   Sodium 138 135 - 145 mmol/L   Potassium 3.4 (L) 3.5 - 5.1 mmol/L   Chloride 103 98 - 111 mmol/L   CO2 25 22 - 32 mmol/L   Glucose, Bld 224 (H) 70 - 99 mg/dL   BUN 10 6 - 20 mg/dL   Creatinine, Ser 1.88 0.61 - 1.24 mg/dL   Calcium 9.2 8.9 - 41.6 mg/dL    Total Protein 6.9 6.5 - 8.1 g/dL   Albumin 3.9 3.5 - 5.0 g/dL   AST 27 15 - 41 U/L   ALT 22 0 - 44 U/L   Alkaline Phosphatase 60 38 - 126 U/L   Total Bilirubin 0.7 0.3 - 1.2 mg/dL   GFR, Estimated >60 >63 mL/min   Anion gap 10 5 - 15      Assessment & Plan:   Encounter Diagnoses  Name Primary?  . Essential hypertension Yes  . Uncontrolled type 2 diabetes mellitus with hyperglycemia (HCC)   . Tobacco use disorder   . Chest pain, unspecified type   . Personal history of noncompliance with medical treatment, presenting hazards to health     -reviewed labs with pt -Counseled pt to take correct current meds.  Get back on 20mg  lisinopril.  Continue metoprolol 50mg  bid -pt to Cut back metformin to 500mg  bid to reduce diarrhea -Add januvia.  Pt counseled to watch diabetic diet -pt has Dm eye exam- later this month -refer to cardiology for chest pain.   He has NTG to use prn. -pt is given [employer] / [employer] financial assistance application -Encouraged smoking cessation -pt to follow up 2-3 weeks to recheck BP.  He is to contact office sooner prn worsening or changes

## 2020-11-11 ENCOUNTER — Encounter: Payer: Self-pay | Admitting: Physician Assistant

## 2020-11-11 ENCOUNTER — Ambulatory Visit: Payer: Self-pay | Admitting: Physician Assistant

## 2020-11-11 VITALS — BP 160/80 | HR 81 | Temp 98.2°F | Ht 70.0 in | Wt 200.0 lb

## 2020-11-11 DIAGNOSIS — I10 Essential (primary) hypertension: Secondary | ICD-10-CM

## 2020-11-11 DIAGNOSIS — Z9114 Patient's other noncompliance with medication regimen: Secondary | ICD-10-CM

## 2020-11-11 DIAGNOSIS — E1165 Type 2 diabetes mellitus with hyperglycemia: Secondary | ICD-10-CM

## 2020-11-11 NOTE — Progress Notes (Signed)
BP (!) 160/80    Pulse 81    Temp 98.2 F (36.8 C)    Ht 5\' 10"  (1.778 m)    Wt 200 lb (90.7 kg)    SpO2 96%    BMI 28.70 kg/m    Subjective:    Patient ID: , male    DOB: December 13, 1961, 59 y.o.   MRN: [id number]  HPI: Robert Lyons is a 59 y.o. male presenting on 11/11/2020 for Hypertension   HPI     Pt had a negative covid 19 screening qustionnaire.    Chief Complaint  Patient presents with   Hypertension    Pt is still not taking his medications as prescribed.  Have had similar issues at previous appointments.    Relevant past medical, surgical, family and social history reviewed and updated as indicated. Interim medical history since our last visit reviewed. Allergies and medications reviewed and updated.   Current Outpatient Medications:    metFORMIN (GLUCOPHAGE) 500 MG tablet, Take 1 tablet (500 mg total) by mouth 2 (two) times daily with a meal. (Patient taking differently: Take 500 mg by mouth daily. ), Disp: 180 tablet, Rfl: 1   metoprolol tartrate (LOPRESSOR) 50 MG tablet, Take 1 tablet (50 mg total) by mouth 2 (two) times daily. (Patient taking differently: Take 50 mg by mouth daily. ), Disp: 180 tablet, Rfl: 1   nitroGLYCERIN (NITROSTAT) 0.4 MG SL tablet, Place 1 tablet (0.4 mg total) under the tongue every 5 (five) minutes as needed for chest pain., Disp: 25 tablet, Rfl: 3   omeprazole (PRILOSEC) 40 MG capsule, Take 1 capsule (40 mg total) by mouth daily., Disp: 90 capsule, Rfl: 0   sitaGLIPtin (JANUVIA) 100 MG tablet, Take 1 tablet (100 mg total) by mouth daily., Disp: 90 tablet, Rfl: 1   lisinopril (ZESTRIL) 20 MG tablet, Take 1 tablet (20 mg total) by mouth daily. (Patient not taking: Reported on 11/11/2020), Disp: 90 tablet, Rfl: 0    Review of Systems  Per HPI unless specifically indicated above     Objective:    BP (!) 160/80    Pulse 81    Temp 98.2 F (36.8 C)    Ht 5\' 10"  (1.778 m)    Wt 200 lb (90.7 kg)    SpO2 96%     BMI 28.70 kg/m   Wt Readings from Last 3 Encounters:  11/11/20 200 lb (90.7 kg)  10/27/20 199 lb 1.6 oz (90.3 kg)  10/05/20 196 lb (88.9 kg)    Physical Exam Vitals reviewed.  Constitutional:      General: He is not in acute distress.    Appearance: He is well-developed. He is not toxic-appearing.  HENT:     Head: Normocephalic and atraumatic.  Cardiovascular:     Rate and Rhythm: Normal rate and regular rhythm.  Pulmonary:     Effort: Pulmonary effort is normal.     Breath sounds: Normal breath sounds. No wheezing.  Abdominal:     General: Bowel sounds are normal.     Palpations: Abdomen is soft.     Tenderness: There is no abdominal tenderness.  Musculoskeletal:     Cervical back: Neck supple.     Right lower leg: No edema.     Left lower leg: No edema.  Lymphadenopathy:     Cervical: No cervical adenopathy.  Skin:    General: Skin is warm and dry.  Neurological:     Mental Status: He is alert and oriented  to person, place, and time.  Psychiatric:        Behavior: Behavior normal.             Assessment & Plan:    Encounter Diagnoses  Name Primary?   Essential hypertension Yes   Uncontrolled type 2 diabetes mellitus with hyperglycemia (HCC)    Not taking medication as directed      -counseled pt about need to take meds as prescribed and risks of not doing so which include CVA, MI, premature death -pt is encouraged to read his labels and take meds as prescribed.  Wrote 1 or 2 on top of bottles for pt to help him remember which are once daily or twice daily -pt to follow up 2 week to recheck bp and medication tolerance.  He is to contact office sooner prn

## 2020-11-25 ENCOUNTER — Ambulatory Visit: Payer: Self-pay | Admitting: Physician Assistant

## 2020-11-25 ENCOUNTER — Encounter: Payer: Self-pay | Admitting: Physician Assistant

## 2020-11-25 VITALS — BP 120/70 | HR 86 | Temp 96.6°F | Ht 70.0 in | Wt 198.2 lb

## 2020-11-25 DIAGNOSIS — E785 Hyperlipidemia, unspecified: Secondary | ICD-10-CM

## 2020-11-25 DIAGNOSIS — I10 Essential (primary) hypertension: Secondary | ICD-10-CM

## 2020-11-25 DIAGNOSIS — E1165 Type 2 diabetes mellitus with hyperglycemia: Secondary | ICD-10-CM

## 2020-11-25 NOTE — Progress Notes (Signed)
BP 120/70    Pulse 86    Temp (!) 96.6 F (35.9 C)    Ht 5\' 10"  (1.778 m)    Wt 198 lb 4 oz (89.9 kg)    SpO2 96%    BMI 28.45 kg/m    Subjective:    Patient ID: , male    DOB: 1961/05/26, 59 y.o.   MRN: [id number]  HPI: RIGHTEOUS CLAIBORNE is a 59 y.o. male presenting on 11/25/2020 for No chief complaint on file.   HPI   Pt had a negative covid 19 screening quesionnaire.   Pt is 59yo with HTN and DM who was in today for recheck BP.  He brought his meds with him today.  He is feeling well and has no complaints.     Relevant past medical, surgical, family and social history reviewed and updated as indicated. Interim medical history since our last visit reviewed. Allergies and medications reviewed and updated.   Current Outpatient Medications:    lisinopril (ZESTRIL) 20 MG tablet, Take 1 tablet (20 mg total) by mouth daily., Disp: 90 tablet, Rfl: 0   metFORMIN (GLUCOPHAGE) 500 MG tablet, Take 1 tablet (500 mg total) by mouth 2 (two) times daily with a meal. (Patient taking differently: Take 500 mg by mouth daily. ), Disp: 180 tablet, Rfl: 1   metoprolol tartrate (LOPRESSOR) 50 MG tablet, Take 1 tablet (50 mg total) by mouth 2 (two) times daily. (Patient taking differently: Take 50 mg by mouth daily. ), Disp: 180 tablet, Rfl: 1   nitroGLYCERIN (NITROSTAT) 0.4 MG SL tablet, Place 1 tablet (0.4 mg total) under the tongue every 5 (five) minutes as needed for chest pain., Disp: 25 tablet, Rfl: 3   omeprazole (PRILOSEC) 40 MG capsule, Take 1 capsule (40 mg total) by mouth daily., Disp: 90 capsule, Rfl: 0   sitaGLIPtin (JANUVIA) 100 MG tablet, Take 1 tablet (100 mg total) by mouth daily., Disp: 90 tablet, Rfl: 1     Review of Systems  Per HPI unless specifically indicated above     Objective:    BP 120/70    Pulse 86    Temp (!) 96.6 F (35.9 C)    Ht 5\' 10"  (1.778 m)    Wt 198 lb 4 oz (89.9 kg)    SpO2 96%    BMI 28.45 kg/m   Wt Readings from Last 3  Encounters:  11/25/20 198 lb 4 oz (89.9 kg)  11/11/20 200 lb (90.7 kg)  10/27/20 199 lb 1.6 oz (90.3 kg)    Physical Exam Vitals reviewed.  Constitutional:      General: He is not in acute distress.    Appearance: He is well-developed. He is not ill-appearing.  HENT:     Head: Normocephalic and atraumatic.  Cardiovascular:     Rate and Rhythm: Normal rate and regular rhythm.  Pulmonary:     Effort: Pulmonary effort is normal.     Breath sounds: Normal breath sounds. No wheezing.  Abdominal:     General: Bowel sounds are normal.     Palpations: Abdomen is soft.     Tenderness: There is no abdominal tenderness.  Musculoskeletal:     Cervical back: Neck supple.     Right lower leg: No edema.     Left lower leg: No edema.  Lymphadenopathy:     Cervical: No cervical adenopathy.  Skin:    General: Skin is warm and dry.  Neurological:     Mental Status:  He is alert and oriented to person, place, and time.  Psychiatric:        Behavior: Behavior normal.            Assessment & Plan:   Encounter Diagnosis  Name Primary?   Essential hypertension Yes     -no changes to medications but he is again encouraged to Take meds as prescribed, specifically to take bid the meds that are written to be taken bid -he will follow up 6 weeks with labs before appointment.  He is to contact office sooner prn

## 2021-01-19 ENCOUNTER — Encounter: Payer: Self-pay | Admitting: Physician Assistant

## 2021-01-19 ENCOUNTER — Ambulatory Visit: Payer: Self-pay | Admitting: Physician Assistant

## 2021-01-19 DIAGNOSIS — F17200 Nicotine dependence, unspecified, uncomplicated: Secondary | ICD-10-CM

## 2021-01-19 DIAGNOSIS — E785 Hyperlipidemia, unspecified: Secondary | ICD-10-CM

## 2021-01-19 DIAGNOSIS — E1165 Type 2 diabetes mellitus with hyperglycemia: Secondary | ICD-10-CM

## 2021-01-19 DIAGNOSIS — I10 Essential (primary) hypertension: Secondary | ICD-10-CM

## 2021-01-19 NOTE — Progress Notes (Signed)
There were no vitals taken for this visit.   Subjective:    Patient ID: Robert Lyons, male    DOB: 1961-11-18, 60 y.o.   MRN: [id number]  HPI: Robert Lyons is a 60 y.o. male presenting on 01/19/2021 for No chief complaint on file.   HPI   This is  Telemedicine appointment due to coronavirus pandemic.  It is via telephone as pt was unable to get connected through [employer].  I connected with  Robert Lyons on 01/19/21 by a video enabled telemedicine application and verified that I am speaking with the correct person using two identifiers.   I discussed the limitations of evaluation and management by telemedicine. The patient expressed understanding and agreed to proceed.  Pt is at home.  Provider is at office.    Pt is 59yoM with DM, htn and GERD. He says he is Doing okay.  He says his Feet hurt sometimes.   He is Not working currently He Does not exercise but occasionally walks He Not yet gotten covid booster but just became eligible this week.  He is planning to get it soon.  He has appointment with cardiology 2/1.  He has NTG to use if needed.   He didn't get labs drawn. He is thinking he might go in the morning.  He just hasn't had a chance yet.       Relevant past medical, surgical, family and social history reviewed and updated as indicated. Interim medical history since our last visit reviewed. Allergies and medications reviewed and updated.   Current Outpatient Medications:  .  lisinopril (ZESTRIL) 20 MG tablet, Take 1 tablet (20 mg total) by mouth daily., Disp: 90 tablet, Rfl: 0 .  metFORMIN (GLUCOPHAGE) 500 MG tablet, Take 1 tablet (500 mg total) by mouth 2 (two) times daily with a meal., Disp: 180 tablet, Rfl: 1 .  metoprolol tartrate (LOPRESSOR) 50 MG tablet, Take 1 tablet (50 mg total) by mouth 2 (two) times daily., Disp: 180 tablet, Rfl: 1 .  omeprazole (PRILOSEC) 40 MG capsule, Take 1 capsule (40 mg total) by mouth daily., Disp: 90 capsule, Rfl:  0 .  sitaGLIPtin (JANUVIA) 100 MG tablet, Take 1 tablet (100 mg total) by mouth daily., Disp: 90 tablet, Rfl: 1 .  nitroGLYCERIN (NITROSTAT) 0.4 MG SL tablet, Place 1 tablet (0.4 mg total) under the tongue every 5 (five) minutes as needed for chest pain. (Patient not taking: Reported on 01/19/2021), Disp: 25 tablet, Rfl: 3    Review of Systems  Per HPI unless specifically indicated above     Objective:    There were no vitals taken for this visit.  Wt Readings from Last 3 Encounters:  11/25/20 198 lb 4 oz (89.9 kg)  11/11/20 200 lb (90.7 kg)  10/27/20 199 lb 1.6 oz (90.3 kg)    Physical Exam Pulmonary:     Effort: No respiratory distress.     Comments: Pt is speaking in complete sentences without dyspnea Neurological:     Mental Status: He is alert and oriented to person, place, and time.  Psychiatric:        Attention and Perception: Attention normal.        Mood and Affect: Mood normal.        Speech: Speech normal.        Behavior: Behavior is cooperative.            Assessment & Plan:   Encounter Diagnoses  Name Primary?  . Essential  hypertension Yes  . Uncontrolled type 2 diabetes mellitus with hyperglycemia (HCC)   . Hyperlipidemia, unspecified hyperlipidemia type   . Tobacco use disorder       -Pt to Get labs drawn. He will be called with results -no changes today -pt to see Cardiology as scheduled -encouraged pt to get covid booster -encouraged smoking cesstion -pt to follow up 3 months.  He is to contact office sooner prn

## 2021-01-26 ENCOUNTER — Other Ambulatory Visit: Payer: Self-pay

## 2021-01-26 ENCOUNTER — Encounter: Payer: Self-pay | Admitting: *Deleted

## 2021-01-26 ENCOUNTER — Encounter: Payer: Self-pay | Admitting: Cardiology

## 2021-01-26 ENCOUNTER — Ambulatory Visit (INDEPENDENT_AMBULATORY_CARE_PROVIDER_SITE_OTHER): Payer: Self-pay | Admitting: Cardiology

## 2021-01-26 VITALS — BP 142/68 | HR 86 | Ht 71.0 in | Wt 203.0 lb

## 2021-01-26 DIAGNOSIS — R0789 Other chest pain: Secondary | ICD-10-CM

## 2021-01-26 DIAGNOSIS — E1165 Type 2 diabetes mellitus with hyperglycemia: Secondary | ICD-10-CM

## 2021-01-26 DIAGNOSIS — R079 Chest pain, unspecified: Secondary | ICD-10-CM

## 2021-01-26 DIAGNOSIS — Z720 Tobacco use: Secondary | ICD-10-CM

## 2021-01-26 DIAGNOSIS — I10 Essential (primary) hypertension: Secondary | ICD-10-CM

## 2021-01-26 NOTE — Patient Instructions (Signed)
Medication Instructions:  Your physician recommends that you continue on your current medications as directed. Please refer to the Current Medication list given to you today.  *If you need a refill on your cardiac medications before your next appointment, please call your pharmacy*   Lab Work: NONE   If you have labs (blood work) drawn today and your tests are completely normal, you will receive your results only by: [name] MyChart Message (if you have MyChart) OR . A paper copy in the mail If you have any lab test that is abnormal or we need to change your treatment, we will call you to review the results.   Testing/Procedures: Your physician has requested that you have a lexiscan myoview. For further information please visit [email or web address] Please follow instruction sheet, as given.  Follow-Up: At [facility], you and your health needs are our priority.  As part of our continuing mission to provide you with exceptional heart care, we have created designated Provider Care Teams.  These Care Teams include your primary Cardiologist (physician) and Advanced Practice Providers (APPs -  Physician Assistants and Nurse Practitioners) who all work together to provide you with the care you need, when you need it.  We recommend signing up for the patient portal called "MyChart".  Sign up information is provided on this After Visit Summary.  MyChart is used to connect with patients for Virtual Visits (Telemedicine).  Patients are able to view lab/test results, encounter notes, upcoming appointments, etc.  Non-urgent messages can be sent to your provider as well.   To learn more about what you can do with MyChart, go to [email or web address].    Your next appointment:    Pending test results   The format for your next appointment:   In Person  Provider:   Dr. Diona Browner    Other Instructions Thank you for choosing [facility]!

## 2021-01-26 NOTE — Progress Notes (Signed)
Cardiology Office Note  Date: 01/26/2021   ID: AHAD COLARUSSO, DOB June 24, 1961, MRN [id number]  PCP:  Jacquelin Hawking, PA-C  Cardiologist:  Nona Dell, MD Electrophysiologist:  None   Chief Complaint  Patient presents with  . Chest Pain    History of Present Illness: Robert Lyons is a 60 y.o. male referred for cardiology consultation by Ms. McElroy PA-C for the evaluation of chest pain.  We discussed his symptoms today.  He tells me that for the last few "years" he has been experiencing a relatively sharp and sudden left-sided chest discomfort without definite precipitant.  This could happen with walking or sitting on the couch watching television.  He states that symptoms are generally very brief.  He was prescribed nitroglycerin by PCP and has tried it a few times, thinks that it may have helped, but again the symptoms are very brief in general.  He does not describe any worsening shortness of breath, no palpitations or syncope.  Cardiac risk factors include age and gender, hypertension, tobacco abuse and type 2 diabetes mellitus.  His LDL was 86 recently with HDL 39.  He is not on statin therapy.  I reviewed his medications which are listed below.  I personally reviewed his ECG today which shows sinus rhythm with nonspecific ST changes in lead motion artifact.  Past Medical History:  Diagnosis Date  . GERD (gastroesophageal reflux disease)   . Hypertension   . Pneumonia   . Type 2 diabetes mellitus (HCC)     Past Surgical History:  Procedure Laterality Date  . No prior surgery      Current Outpatient Medications  Medication Sig Dispense Refill  . lisinopril (ZESTRIL) 20 MG tablet Take 1 tablet (20 mg total) by mouth daily. 90 tablet 0  . metFORMIN (GLUCOPHAGE) 500 MG tablet Take 1 tablet (500 mg total) by mouth 2 (two) times daily with a meal. 180 tablet 1  . metoprolol tartrate (LOPRESSOR) 50 MG tablet Take 1 tablet (50 mg total) by mouth 2 (two) times  daily. 180 tablet 1  . nitroGLYCERIN (NITROSTAT) 0.4 MG SL tablet Place 1 tablet (0.4 mg total) under the tongue every 5 (five) minutes as needed for chest pain. 25 tablet 3  . omeprazole (PRILOSEC) 40 MG capsule Take 1 capsule (40 mg total) by mouth daily. 90 capsule 0  . sitaGLIPtin (JANUVIA) 100 MG tablet Take 1 tablet (100 mg total) by mouth daily. 90 tablet 1   No current facility-administered medications for this visit.   Allergies:  Patient has no known allergies.   Social History: The patient  reports that he has been smoking cigarettes. He has a 15.00 pack-year smoking history. He has never used smokeless tobacco. He reports that he does not drink alcohol and does not use drugs.   Family History: The patient's family history includes Diabetes in his maternal aunt, maternal uncle, and mother; Hypertension in his mother.  ROS: No syncope.  No claudication.  Physical Exam: VS:  BP (!) 142/68   Pulse 86   Ht 5\' 11"  (1.803 m)   Wt 203 lb (92.1 kg)   SpO2 97%   BMI 28.31 kg/m , BMI Body mass index is 28.31 kg/m.  Wt Readings from Last 3 Encounters:  01/26/21 203 lb (92.1 kg)  11/25/20 198 lb 4 oz (89.9 kg)  11/11/20 200 lb (90.7 kg)    General: Patient appears comfortable at rest. HEENT: Conjunctiva and lids normal, wearing a mask. Neck: Supple, no  elevated JVP or carotid bruits, no thyromegaly. Lungs: Clear to auscultation, nonlabored breathing at rest. Cardiac: Regular rate and rhythm, no S3 or significant systolic murmur, no pericardial rub. Abdomen: Soft, nontender, bowel sounds present. Extremities: No pitting edema, distal pulses 2+. Skin: Warm and dry. Musculoskeletal: No kyphosis. Neuropsychiatric: Alert and oriented x3, affect grossly appropriate.  ECG:  An ECG dated 05/27/2020 was personally reviewed today and demonstrated:  Normal sinus rhythm.  Recent Labwork: 05/28/2020: Hemoglobin 12.6; Platelets 232 10/06/2020: ALT 22; AST 27; BUN 10; Creatinine, Ser 0.97;  Potassium 3.4; Sodium 138     Component Value Date/Time   CHOL 180 10/06/2020 0811   TRIG 273 (H) 10/06/2020 0811   HDL 39 (L) 10/06/2020 0811   CHOLHDL 4.6 10/06/2020 0811   VLDL 55 (H) 10/06/2020 0811   LDLCALC 86 10/06/2020 0811    Other Studies Reviewed Today:  No prior cardiac testing for review today.  Assessment and Plan:  1.  Relatively longstanding history of recurrent, atypical chest pain as described above in a 60 year old male with history of type 2 diabetes mellitus, hypertension, and tobacco abuse.  ECG is abnormal but nonspecific.  He is currently on lisinopril, Lopressor, and has as needed nitroglycerin available.  Plan to proceed with a Lexiscan Myoview for further ischemic evaluation.  2.  Type 2 diabetes mellitus, on Glucophage and Januvia.  Low an A1c was 8.4%.  3.  Essential hypertension, on lisinopril and Lopressor.  Systolic in the 140s today.  4.  Tobacco abuse.  We discussed smoking cessation today.  He states he has been thinking about this more.  Medication Adjustments/Labs and Tests Ordered: Current medicines are reviewed at length with the patient today.  Concerns regarding medicines are outlined above.   Tests Ordered: Orders Placed This Encounter  Procedures  . NM Myocar Multi W/Spect W/Wall Motion / EF  . EKG 12-Lead    Medication Changes: No orders of the defined types were placed in this encounter.   Disposition:  Follow up test results.  Signed, Robert Sidle, MD, [facility] 01/26/2021 1:39 PM    [facility] at [facility] [address], [place], [place] [address] Phone: [phone]; Fax: [phone]

## 2021-02-01 ENCOUNTER — Encounter: Payer: Self-pay | Admitting: Physician Assistant

## 2021-02-05 ENCOUNTER — Other Ambulatory Visit: Payer: Self-pay

## 2021-02-05 ENCOUNTER — Encounter (HOSPITAL_COMMUNITY)
Admission: RE | Admit: 2021-02-05 | Discharge: 2021-02-05 | Disposition: A | Discharge status: Home / Self Care | Payer: Self-pay | Source: Ambulatory Visit | Attending: Cardiology | Admitting: Cardiology

## 2021-02-05 ENCOUNTER — Encounter (HOSPITAL_BASED_OUTPATIENT_CLINIC_OR_DEPARTMENT_OTHER)
Admission: RE | Admit: 2021-02-05 | Discharge: 2021-02-05 | Disposition: A | Discharge status: Home / Self Care | Payer: Self-pay | Source: Ambulatory Visit | Attending: Cardiology | Admitting: Cardiology

## 2021-02-05 ENCOUNTER — Telehealth: Payer: Self-pay

## 2021-02-05 DIAGNOSIS — R079 Chest pain, unspecified: Secondary | ICD-10-CM

## 2021-02-05 DIAGNOSIS — R0789 Other chest pain: Secondary | ICD-10-CM

## 2021-02-05 LAB — NM MYOCAR MULTI W/SPECT W/WALL MOTION / EF
LV dias vol: 128 mL (ref 62–150)
LV sys vol: 65 mL
Peak HR: 87 {beats}/min
RATE: 0.31
Rest HR: 67 {beats}/min
SDS: 0
SRS: 1
SSS: 1
TID: 1.1

## 2021-02-05 MED ORDER — REGADENOSON 0.4 MG/5ML IV SOLN
INTRAVENOUS | Status: AC
  Administered 2021-02-05: 0.4 mg via INTRAVENOUS
  Filled 2021-02-05: qty 5

## 2021-02-05 MED ORDER — TECHNETIUM TC 99M TETROFOSMIN IV KIT
10.0000 | PACK | Freq: Once | INTRAVENOUS | Status: AC | PRN
  Administered 2021-02-05: 11 via INTRAVENOUS

## 2021-02-05 MED ORDER — TECHNETIUM TC 99M TETROFOSMIN IV KIT
30.0000 | PACK | Freq: Once | INTRAVENOUS | Status: AC | PRN
  Administered 2021-02-05: 31 via INTRAVENOUS

## 2021-02-05 MED ORDER — SODIUM CHLORIDE FLUSH 0.9 % IV SOLN
INTRAVENOUS | Status: AC
  Administered 2021-02-05: 10 mL via INTRAVENOUS
  Filled 2021-02-05: qty 10

## 2021-02-05 NOTE — Telephone Encounter (Signed)
Patient given test results, echo order placed, forward to schedulers

## 2021-02-05 NOTE — Telephone Encounter (Signed)
-----   Message from Jonelle Sidle, MD sent at 02/05/2021  1:43 PM EST ----- Results reviewed.  Please let him know that the stress test was reassuring, does not show clear evidence of obstructive CAD as cause of his chest pain.  Would like to clarify cardiac function and assess LVEF by echocardiogram, please order.  If this is reassuring, no further cardiac testing is planned.

## 2021-02-12 ENCOUNTER — Ambulatory Visit (HOSPITAL_COMMUNITY)
Admission: RE | Admit: 2021-02-12 | Discharge: 2021-02-12 | Disposition: A | Discharge status: Home / Self Care | Payer: Self-pay | Source: Ambulatory Visit | Attending: Cardiology | Admitting: Cardiology

## 2021-02-12 ENCOUNTER — Other Ambulatory Visit: Payer: Self-pay

## 2021-02-12 DIAGNOSIS — R0789 Other chest pain: Secondary | ICD-10-CM | POA: Insufficient documentation

## 2021-02-12 LAB — ECHOCARDIOGRAM COMPLETE
Area-P 1/2: 2.75 cm2
S' Lateral: 2.29 cm

## 2021-02-12 NOTE — Progress Notes (Signed)
*  PRELIMINARY RESULTS* Echocardiogram 2D Echocardiogram has been performed.  Jeryl Columbia 02/12/2021, 12:40 PM

## 2021-04-26 ENCOUNTER — Other Ambulatory Visit (HOSPITAL_COMMUNITY)
Admission: RE | Admit: 2021-04-26 | Discharge: 2021-04-26 | Disposition: A | Discharge status: Home / Self Care | Payer: Self-pay | Source: Ambulatory Visit | Attending: Physician Assistant | Admitting: Physician Assistant

## 2021-04-26 ENCOUNTER — Other Ambulatory Visit: Payer: Self-pay

## 2021-04-26 ENCOUNTER — Ambulatory Visit: Payer: Self-pay | Admitting: Physician Assistant

## 2021-04-26 DIAGNOSIS — E1165 Type 2 diabetes mellitus with hyperglycemia: Secondary | ICD-10-CM | POA: Insufficient documentation

## 2021-04-26 DIAGNOSIS — E876 Hypokalemia: Secondary | ICD-10-CM

## 2021-04-26 DIAGNOSIS — I10 Essential (primary) hypertension: Secondary | ICD-10-CM | POA: Insufficient documentation

## 2021-04-26 DIAGNOSIS — E785 Hyperlipidemia, unspecified: Secondary | ICD-10-CM | POA: Insufficient documentation

## 2021-04-26 LAB — LIPID PANEL
Cholesterol: 181 mg/dL (ref 0–200)
HDL: 42 mg/dL (ref >40)
LDL Cholesterol: 111 mg/dL — ABNORMAL HIGH (ref 0–99)
Total CHOL/HDL Ratio: 4.3 RATIO
Triglycerides: 141 mg/dL (ref <150)
VLDL: 28 mg/dL (ref 0–40)

## 2021-04-26 LAB — COMPREHENSIVE METABOLIC PANEL
ALT: 24 U/L (ref 0–44)
AST: 26 U/L (ref 15–41)
Albumin: 3.9 g/dL (ref 3.5–5.0)
Alkaline Phosphatase: 67 U/L (ref 38–126)
Anion gap: 8 (ref 5–15)
BUN: 10 mg/dL (ref 8–23)
CO2: 24 mmol/L (ref 22–32)
Calcium: 8.9 mg/dL (ref 8.9–10.3)
Chloride: 105 mmol/L (ref 98–111)
Creatinine, Ser: 0.85 mg/dL (ref 0.61–1.24)
GFR, Estimated: >60 mL/min (ref >60)
Glucose, Bld: 246 mg/dL — ABNORMAL HIGH (ref 70–99)
Potassium: 3.8 mmol/L (ref 3.5–5.1)
Sodium: 137 mmol/L (ref 135–145)
Total Bilirubin: 0.8 mg/dL (ref 0.3–1.2)
Total Protein: 7.1 g/dL (ref 6.5–8.1)

## 2021-04-26 LAB — HEMOGLOBIN A1C
Hgb A1c MFr Bld: 8.5 % — ABNORMAL HIGH (ref 4.8–5.6)
Mean Plasma Glucose: 197.25 mg/dL

## 2021-04-29 ENCOUNTER — Other Ambulatory Visit: Payer: Self-pay

## 2021-04-29 ENCOUNTER — Encounter: Payer: Self-pay | Admitting: Physician Assistant

## 2021-04-29 ENCOUNTER — Ambulatory Visit: Payer: Self-pay | Admitting: Physician Assistant

## 2021-04-29 VITALS — BP 146/78 | HR 63 | Temp 98.0°F

## 2021-04-29 DIAGNOSIS — E1165 Type 2 diabetes mellitus with hyperglycemia: Secondary | ICD-10-CM

## 2021-04-29 DIAGNOSIS — F17200 Nicotine dependence, unspecified, uncomplicated: Secondary | ICD-10-CM

## 2021-04-29 DIAGNOSIS — I10 Essential (primary) hypertension: Secondary | ICD-10-CM

## 2021-04-29 MED ORDER — SITAGLIPTIN PHOSPHATE 100 MG PO TABS
100.0000 mg | ORAL_TABLET | Freq: Every day | ORAL | 0 refills | Status: DC
Start: 2021-04-29 — End: 2021-08-05

## 2021-04-29 MED ORDER — LISINOPRIL 40 MG PO TABS: 40.0000 mg | ORAL_TABLET | Freq: Every day | ORAL | 0 refills | Status: DC

## 2021-04-29 NOTE — Progress Notes (Signed)
BP (!) 146/78   Pulse 63   Temp 98 F (36.7 C)   SpO2 96%    Subjective:    Patient ID: Robert Lyons, male    DOB: Apr 26, 1961, 59 y.o.   MRN: [id number]  HPI: Robert Lyons is a 60 y.o. male presenting on 04/29/2021 for Diabetes, Hypertension, and Hyperlipidemia   HPI  Pt had negative covid 19 screening questionnaire.    Chief Complaint  Patient presents with  . Diabetes  . Hypertension  . Hyperlipidemia  \\  Pt recently seen by cardiology for atypical CP and pt had lexiscan myoview which was unremarkable.  Pt is out of some of his meds.  He says he feels pretty good today.    Relevant past medical, surgical, family and social history reviewed and updated as indicated. Interim medical history since our last visit reviewed. Allergies and medications reviewed and updated.   Current Outpatient Medications:  .  lisinopril (ZESTRIL) 20 MG tablet, Take 1 tablet (20 mg total) by mouth daily., Disp: 90 tablet, Rfl: 0 .  metFORMIN (GLUCOPHAGE) 500 MG tablet, Take 1 tablet (500 mg total) by mouth 2 (two) times daily with a meal., Disp: 180 tablet, Rfl: 1 .  metoprolol tartrate (LOPRESSOR) 50 MG tablet, Take 1 tablet (50 mg total) by mouth 2 (two) times daily., Disp: 180 tablet, Rfl: 1 .  omeprazole (PRILOSEC) 40 MG capsule, Take 1 capsule (40 mg total) by mouth daily., Disp: 90 capsule, Rfl: 0 .  nitroGLYCERIN (NITROSTAT) 0.4 MG SL tablet, Place 1 tablet (0.4 mg total) under the tongue every 5 (five) minutes as needed for chest pain. (Patient not taking: Reported on 04/29/2021), Disp: 25 tablet, Rfl: 3 .  sitaGLIPtin (JANUVIA) 100 MG tablet, Take 1 tablet (100 mg total) by mouth daily. (Patient not taking: Reported on 04/29/2021), Disp: 90 tablet, Rfl: 1   Review of Systems  Per HPI unless specifically indicated above     Objective:    BP (!) 146/78   Pulse 63   Temp 98 F (36.7 C)   SpO2 96%   Wt Readings from Last 3 Encounters:  01/26/21 203 lb (92.1 kg)   11/25/20 198 lb 4 oz (89.9 kg)  11/11/20 200 lb (90.7 kg)    Physical Exam Vitals reviewed.  Constitutional:      General: He is not in acute distress.    Appearance: He is well-developed. He is not toxic-appearing.  HENT:     Head: Normocephalic and atraumatic.  Cardiovascular:     Rate and Rhythm: Normal rate and regular rhythm.  Pulmonary:     Effort: Pulmonary effort is normal.     Breath sounds: Normal breath sounds. No wheezing.  Abdominal:     General: Bowel sounds are normal.     Palpations: Abdomen is soft.     Tenderness: There is no abdominal tenderness.  Musculoskeletal:     Cervical back: Neck supple.     Right lower leg: No edema.     Left lower leg: No edema.  Lymphadenopathy:     Cervical: No cervical adenopathy.  Skin:    General: Skin is warm and dry.  Neurological:     Mental Status: He is alert and oriented to person, place, and time.  Psychiatric:        Attention and Perception: Attention normal.        Speech: Speech normal.        Behavior: Behavior normal. Behavior is cooperative.  Results for orders placed or performed during the hospital encounter of 04/26/21  Lipid panel  Result Value Ref Range   Cholesterol 181 0 - 200 mg/dL   Triglycerides 962 <229 mg/dL   HDL 42 >79 mg/dL   Total CHOL/HDL Ratio 4.3 RATIO   VLDL 28 0 - 40 mg/dL   LDL Cholesterol 892 (H) 0 - 99 mg/dL  Comprehensive metabolic panel  Result Value Ref Range   Sodium 137 135 - 145 mmol/L   Potassium 3.8 3.5 - 5.1 mmol/L   Chloride 105 98 - 111 mmol/L   CO2 24 22 - 32 mmol/L   Glucose, Bld 246 (H) 70 - 99 mg/dL   BUN 10 8 - 23 mg/dL   Creatinine, Ser 1.19 0.61 - 1.24 mg/dL   Calcium 8.9 8.9 - 41.7 mg/dL   Total Protein 7.1 6.5 - 8.1 g/dL   Albumin 3.9 3.5 - 5.0 g/dL   AST 26 15 - 41 U/L   ALT 24 0 - 44 U/L   Alkaline Phosphatase 67 38 - 126 U/L   Total Bilirubin 0.8 0.3 - 1.2 mg/dL   GFR, Estimated >40 >81 mL/min   Anion gap 8 5 - 15  Hemoglobin A1c  Result  Value Ref Range   Hgb A1c MFr Bld 8.5 (H) 4.8 - 5.6 %   Mean Plasma Glucose 197.25 mg/dL      Assessment & Plan:    Encounter Diagnoses  Name Primary?  [name] Uncontrolled type 2 diabetes mellitus with hyperglycemia (HCC) Yes  . Essential hypertension   . Tobacco use disorder       -Reviewed labs with pt -[facility] forms given to renew -Pt counseled to avoid running out of meds -bp high today.  Was high at cardology appointment in february.  will Increase lisinopril -encouraged smoking cessation -pt to follow up 3 months.  He is to contact office sooner prn

## 2021-06-16 ENCOUNTER — Telehealth: Payer: Self-pay

## 2021-06-16 NOTE — Telephone Encounter (Signed)
Attempted to call client to reschedule [facility] appointment for 06/18/21 unable to reach, phone out of service.  Francee Nodal RN [employer]

## 2021-07-15 ENCOUNTER — Other Ambulatory Visit: Payer: Self-pay | Admitting: Physician Assistant

## 2021-07-15 DIAGNOSIS — E1165 Type 2 diabetes mellitus with hyperglycemia: Secondary | ICD-10-CM

## 2021-07-15 DIAGNOSIS — D649 Anemia, unspecified: Secondary | ICD-10-CM

## 2021-07-15 DIAGNOSIS — Z125 Encounter for screening for malignant neoplasm of prostate: Secondary | ICD-10-CM

## 2021-07-15 DIAGNOSIS — E785 Hyperlipidemia, unspecified: Secondary | ICD-10-CM

## 2021-07-15 DIAGNOSIS — I10 Essential (primary) hypertension: Secondary | ICD-10-CM

## 2021-07-21 ENCOUNTER — Other Ambulatory Visit: Payer: Self-pay | Admitting: Physician Assistant

## 2021-07-21 MED ORDER — OMEPRAZOLE 40 MG PO CPDR: 40.0000 mg | DELAYED_RELEASE_CAPSULE | Freq: Every day | ORAL | 0 refills | Status: DC

## 2021-07-21 MED ORDER — LISINOPRIL 40 MG PO TABS
40.0000 mg | ORAL_TABLET | Freq: Every day | ORAL | 0 refills | Status: DC
Start: 2021-07-21 — End: 2021-08-05

## 2021-07-21 MED ORDER — METOPROLOL TARTRATE 50 MG PO TABS: 50.0000 mg | ORAL_TABLET | Freq: Two times a day (BID) | ORAL | 0 refills | Status: DC

## 2021-07-21 MED ORDER — METFORMIN HCL 500 MG PO TABS: 500.0000 mg | ORAL_TABLET | Freq: Two times a day (BID) | ORAL | 0 refills | Status: DC

## 2021-07-26 ENCOUNTER — Other Ambulatory Visit: Payer: Self-pay

## 2021-07-26 ENCOUNTER — Other Ambulatory Visit (HOSPITAL_COMMUNITY)
Admission: RE | Admit: 2021-07-26 | Discharge: 2021-07-26 | Disposition: A | Discharge status: Home / Self Care | Payer: Self-pay | Source: Ambulatory Visit | Attending: Physician Assistant | Admitting: Physician Assistant

## 2021-07-26 DIAGNOSIS — D649 Anemia, unspecified: Secondary | ICD-10-CM

## 2021-07-26 DIAGNOSIS — E785 Hyperlipidemia, unspecified: Secondary | ICD-10-CM

## 2021-07-26 DIAGNOSIS — Z125 Encounter for screening for malignant neoplasm of prostate: Secondary | ICD-10-CM

## 2021-07-26 DIAGNOSIS — E1165 Type 2 diabetes mellitus with hyperglycemia: Secondary | ICD-10-CM

## 2021-07-26 DIAGNOSIS — I10 Essential (primary) hypertension: Secondary | ICD-10-CM

## 2021-07-26 LAB — COMPREHENSIVE METABOLIC PANEL
ALT: 25 U/L (ref 0–44)
AST: 26 U/L (ref 15–41)
Albumin: 3.9 g/dL (ref 3.5–5.0)
Alkaline Phosphatase: 85 U/L (ref 38–126)
Anion gap: 7 (ref 5–15)
BUN: 10 mg/dL (ref 8–23)
CO2: 26 mmol/L (ref 22–32)
Calcium: 9.2 mg/dL (ref 8.9–10.3)
Chloride: 104 mmol/L (ref 98–111)
Creatinine, Ser: 0.93 mg/dL (ref 0.61–1.24)
GFR, Estimated: >60 mL/min (ref >60)
Glucose, Bld: 296 mg/dL — ABNORMAL HIGH (ref 70–99)
Potassium: 3.6 mmol/L (ref 3.5–5.1)
Sodium: 137 mmol/L (ref 135–145)
Total Bilirubin: 0.9 mg/dL (ref 0.3–1.2)
Total Protein: 7.1 g/dL (ref 6.5–8.1)

## 2021-07-26 LAB — LIPID PANEL
Cholesterol: 182 mg/dL (ref 0–200)
HDL: 36 mg/dL — ABNORMAL LOW (ref >40)
LDL Cholesterol: 105 mg/dL — ABNORMAL HIGH (ref 0–99)
Total CHOL/HDL Ratio: 5.1 RATIO
Triglycerides: 206 mg/dL — ABNORMAL HIGH (ref <150)
VLDL: 41 mg/dL — ABNORMAL HIGH (ref 0–40)

## 2021-07-26 LAB — HEMOGLOBIN AND HEMATOCRIT, BLOOD
HCT: 39.3 % (ref 39.0–52.0)
Hemoglobin: 13.5 g/dL (ref 13.0–17.0)

## 2021-07-26 LAB — HEMOGLOBIN A1C
Hgb A1c MFr Bld: 8.8 % — ABNORMAL HIGH (ref 4.8–5.6)
Mean Plasma Glucose: 205.86 mg/dL

## 2021-07-26 LAB — PSA: Prostatic Specific Antigen: 0.34 ng/mL (ref 0.00–4.00)

## 2021-07-28 ENCOUNTER — Ambulatory Visit: Payer: Self-pay | Admitting: Physician Assistant

## 2021-07-29 ENCOUNTER — Ambulatory Visit: Payer: Self-pay | Admitting: Physician Assistant

## 2021-08-05 ENCOUNTER — Encounter: Payer: Self-pay | Admitting: Physician Assistant

## 2021-08-05 ENCOUNTER — Other Ambulatory Visit: Payer: Self-pay | Admitting: Physician Assistant

## 2021-08-05 ENCOUNTER — Other Ambulatory Visit: Payer: Self-pay

## 2021-08-05 ENCOUNTER — Ambulatory Visit: Payer: Self-pay | Admitting: Physician Assistant

## 2021-08-05 VITALS — BP 132/76 | HR 96 | Temp 97.9°F | Wt 193.0 lb

## 2021-08-05 DIAGNOSIS — Z1211 Encounter for screening for malignant neoplasm of colon: Secondary | ICD-10-CM

## 2021-08-05 DIAGNOSIS — E785 Hyperlipidemia, unspecified: Secondary | ICD-10-CM

## 2021-08-05 DIAGNOSIS — K219 Gastro-esophageal reflux disease without esophagitis: Secondary | ICD-10-CM

## 2021-08-05 DIAGNOSIS — I10 Essential (primary) hypertension: Secondary | ICD-10-CM

## 2021-08-05 DIAGNOSIS — F17200 Nicotine dependence, unspecified, uncomplicated: Secondary | ICD-10-CM

## 2021-08-05 DIAGNOSIS — E1165 Type 2 diabetes mellitus with hyperglycemia: Secondary | ICD-10-CM

## 2021-08-05 DIAGNOSIS — Z9114 Patient's other noncompliance with medication regimen: Secondary | ICD-10-CM

## 2021-08-05 MED ORDER — OMEPRAZOLE 40 MG PO CPDR: 40.0000 mg | DELAYED_RELEASE_CAPSULE | Freq: Every day | ORAL | 0 refills | Status: DC

## 2021-08-05 MED ORDER — METFORMIN HCL 500 MG PO TABS: 500.0000 mg | ORAL_TABLET | Freq: Two times a day (BID) | ORAL | 0 refills | Status: DC

## 2021-08-05 MED ORDER — SITAGLIPTIN PHOSPHATE 100 MG PO TABS: 100.0000 mg | ORAL_TABLET | Freq: Every day | ORAL | 0 refills | Status: DC

## 2021-08-05 MED ORDER — LISINOPRIL 40 MG PO TABS: 40.0000 mg | ORAL_TABLET | Freq: Every day | ORAL | 0 refills | Status: DC

## 2021-08-05 MED ORDER — METOPROLOL TARTRATE 50 MG PO TABS: 50.0000 mg | ORAL_TABLET | Freq: Two times a day (BID) | ORAL | 0 refills | Status: DC

## 2021-08-05 NOTE — Progress Notes (Signed)
BP 132/76   Pulse 96   Temp 97.9 F (36.6 C)   Wt 193 lb (87.5 kg)   SpO2 95%   BMI 26.92 kg/m    Subjective:    Patient ID: Robert Lyons, male    DOB: 28-Aug-1961, 60 y.o.   MRN: [id number]  HPI: Robert Lyons is a 60 y.o. male presenting on 08/05/2021 for Diabetes, Hypertension, and Hyperlipidemia   HPI   Pt had a negative covid 19 screening questionnaire.  Chief Complaint  Patient presents with   Diabetes   Hypertension   Hyperlipidemia    Pt says he had Heartburn last night- tums helped a little bit but didn't make it go away.  He also took a NTG and it slowed down the pain.  He has not been taking his omeprazole.  He's been out of almost all his meds for couple weeks.    Pt had myoview and echo in february that were reassuring.      Relevant past medical, surgical, family and social history reviewed and updated as indicated. Interim medical history since our last visit reviewed. Allergies and medications reviewed and updated.    Current Outpatient Medications:    metoprolol tartrate (LOPRESSOR) 50 MG tablet, Take 1 tablet (50 mg total) by mouth 2 (two) times daily., Disp: 60 tablet, Rfl: 0   nitroGLYCERIN (NITROSTAT) 0.4 MG SL tablet, Place 1 tablet (0.4 mg total) under the tongue every 5 (five) minutes as needed for chest pain., Disp: 25 tablet, Rfl: 3   lisinopril (ZESTRIL) 40 MG tablet, Take 1 tablet (40 mg total) by mouth daily. (Patient not taking: Reported on 08/05/2021), Disp: 30 tablet, Rfl: 0   metFORMIN (GLUCOPHAGE) 500 MG tablet, Take 1 tablet (500 mg total) by mouth 2 (two) times daily with a meal. (Patient not taking: Reported on 08/05/2021), Disp: 60 tablet, Rfl: 0   omeprazole (PRILOSEC) 40 MG capsule, Take 1 capsule (40 mg total) by mouth daily. (Patient not taking: Reported on 08/05/2021), Disp: 30 capsule, Rfl: 0   sitaGLIPtin (JANUVIA) 100 MG tablet, Take 1 tablet (100 mg total) by mouth daily. (Patient not taking: Reported on  08/05/2021), Disp: 90 tablet, Rfl: 0   Review of Systems  Per HPI unless specifically indicated above     Objective:    BP 132/76   Pulse 96   Temp 97.9 F (36.6 C)   Wt 193 lb (87.5 kg)   SpO2 95%   BMI 26.92 kg/m   Wt Readings from Last 3 Encounters:  08/05/21 193 lb (87.5 kg)  01/26/21 203 lb (92.1 kg)  11/25/20 198 lb 4 oz (89.9 kg)    Physical Exam Vitals reviewed.  Constitutional:      General: He is not in acute distress.    Appearance: He is well-developed. He is not ill-appearing.  HENT:     Head: Normocephalic and atraumatic.  Eyes:     Extraocular Movements: Extraocular movements intact.     Conjunctiva/sclera: Conjunctivae normal.     Pupils: Pupils are equal, round, and reactive to light.  Neck:     Thyroid: No thyromegaly.  Cardiovascular:     Rate and Rhythm: Normal rate and regular rhythm.  Pulmonary:     Effort: Pulmonary effort is normal.     Breath sounds: Normal breath sounds. No wheezing or rales.  Abdominal:     General: Bowel sounds are normal.     Palpations: Abdomen is soft. There is no mass.  Tenderness: There is no abdominal tenderness.  Musculoskeletal:     Cervical back: Neck supple.     Right lower leg: No edema.     Left lower leg: No edema.  Lymphadenopathy:     Cervical: No cervical adenopathy.  Skin:    General: Skin is warm and dry.     Findings: No rash.  Neurological:     Mental Status: He is alert and oriented to person, place, and time.  Psychiatric:        Behavior: Behavior normal.    Results for orders placed or performed during the hospital encounter of 07/26/21  PSA  Result Value Ref Range   Prostatic Specific Antigen 0.34 0.00 - 4.00 ng/mL  Hemoglobin and hematocrit, blood  Result Value Ref Range   Hemoglobin 13.5 13.0 - 17.0 g/dL   HCT 16.1 09.6 - 04.5 %  Hemoglobin A1c  Result Value Ref Range   Hgb A1c MFr Bld 8.8 (H) 4.8 - 5.6 %   Mean Plasma Glucose 205.86 mg/dL  Lipid panel  Result Value Ref  Range   Cholesterol 182 0 - 200 mg/dL   Triglycerides 409 (H) <150 mg/dL   HDL 36 (L) >81 mg/dL   Total CHOL/HDL Ratio 5.1 RATIO   VLDL 41 (H) 0 - 40 mg/dL   LDL Cholesterol 191 (H) 0 - 99 mg/dL  Comprehensive metabolic panel  Result Value Ref Range   Sodium 137 135 - 145 mmol/L   Potassium 3.6 3.5 - 5.1 mmol/L   Chloride 104 98 - 111 mmol/L   CO2 26 22 - 32 mmol/L   Glucose, Bld 296 (H) 70 - 99 mg/dL   BUN 10 8 - 23 mg/dL   Creatinine, Ser 4.78 0.61 - 1.24 mg/dL   Calcium 9.2 8.9 - 29.5 mg/dL   Total Protein 7.1 6.5 - 8.1 g/dL   Albumin 3.9 3.5 - 5.0 g/dL   AST 26 15 - 41 U/L   ALT 25 0 - 44 U/L   Alkaline Phosphatase 85 38 - 126 U/L   Total Bilirubin 0.9 0.3 - 1.2 mg/dL   GFR, Estimated >62 >13 mL/min   Anion gap 7 5 - 15      Assessment & Plan:    Encounter Diagnoses  Name Primary?   Uncontrolled type 2 diabetes mellitus with hyperglycemia (HCC) Yes   Essential hypertension    Hyperlipidemia, unspecified hyperlipidemia type    Gastroesophageal reflux disease, unspecified whether esophagitis present    Tobacco use disorder    Not taking medication as directed    Screening for colon cancer       -reviewed labs with pt -pt was encouraged to avoid running out of his medications -refills were sent to [facility] -pt was given FIT test for colon cancer screening -diabetic foot exam was updated -encouraged smoking cessation -pt to follow up 3 months.  He is to contact office sooner prn

## 2021-08-12 LAB — IFOBT (OCCULT BLOOD): IFOBT: NEGATIVE

## 2021-09-28 ENCOUNTER — Emergency Department (HOSPITAL_COMMUNITY): Payer: Self-pay

## 2021-09-28 ENCOUNTER — Other Ambulatory Visit: Payer: Self-pay

## 2021-09-28 ENCOUNTER — Emergency Department (HOSPITAL_COMMUNITY)
Admission: EM | Admit: 2021-09-28 | Discharge: 2021-09-29 | Disposition: A | Discharge status: Home / Self Care | Payer: Self-pay | Attending: Emergency Medicine | Admitting: Emergency Medicine

## 2021-09-28 ENCOUNTER — Encounter (HOSPITAL_COMMUNITY): Payer: Self-pay | Admitting: Emergency Medicine

## 2021-09-28 DIAGNOSIS — Z7984 Long term (current) use of oral hypoglycemic drugs: Secondary | ICD-10-CM | POA: Insufficient documentation

## 2021-09-28 DIAGNOSIS — E119 Type 2 diabetes mellitus without complications: Secondary | ICD-10-CM | POA: Insufficient documentation

## 2021-09-28 DIAGNOSIS — K859 Acute pancreatitis without necrosis or infection, unspecified: Secondary | ICD-10-CM

## 2021-09-28 DIAGNOSIS — Z79899 Other long term (current) drug therapy: Secondary | ICD-10-CM | POA: Insufficient documentation

## 2021-09-28 DIAGNOSIS — F17210 Nicotine dependence, cigarettes, uncomplicated: Secondary | ICD-10-CM | POA: Insufficient documentation

## 2021-09-28 DIAGNOSIS — R1012 Left upper quadrant pain: Secondary | ICD-10-CM | POA: Insufficient documentation

## 2021-09-28 DIAGNOSIS — I10 Essential (primary) hypertension: Secondary | ICD-10-CM | POA: Insufficient documentation

## 2021-09-28 LAB — CBC
HCT: 38.1 % — ABNORMAL LOW (ref 39.0–52.0)
Hemoglobin: 13.1 g/dL (ref 13.0–17.0)
MCH: 31.1 pg (ref 26.0–34.0)
MCHC: 34.4 g/dL (ref 30.0–36.0)
MCV: 90.5 fL (ref 80.0–100.0)
Platelets: 250 10*3/uL (ref 150–400)
RBC: 4.21 MIL/uL — ABNORMAL LOW (ref 4.22–5.81)
RDW: 11.7 % (ref 11.5–15.5)
WBC: 9.2 10*3/uL (ref 4.0–10.5)
nRBC: 0 % (ref 0.0–0.2)

## 2021-09-28 LAB — BASIC METABOLIC PANEL
Anion gap: 9 (ref 5–15)
BUN: 8 mg/dL (ref 8–23)
CO2: 26 mmol/L (ref 22–32)
Calcium: 9.8 mg/dL (ref 8.9–10.3)
Chloride: 102 mmol/L (ref 98–111)
Creatinine, Ser: 1.08 mg/dL (ref 0.61–1.24)
GFR, Estimated: >60 mL/min (ref >60)
Glucose, Bld: 339 mg/dL — ABNORMAL HIGH (ref 70–99)
Potassium: 4.1 mmol/L (ref 3.5–5.1)
Sodium: 137 mmol/L (ref 135–145)

## 2021-09-28 NOTE — ED Triage Notes (Signed)
Pt c/o left flank pain for a long time. Pt states the pain has just got worse today.

## 2021-09-28 NOTE — ED Provider Notes (Signed)
Emergency Medicine Provider Triage Evaluation Note  Robert Lyons , a 60 y.o. male  was evaluated in triage.  Pt complains of pain in his left abdomen which prevented him from sleeping last night.  He smokes cigarettes and has mild cough and occasional shortness of breath.  He denies chest pain, weakness or dizziness..  Review of Systems  Positive: Abdominal pain, cough Negative: No diarrhea or vomiting  Physical Exam  BP 126/66 (BP Location: Right Arm)   Pulse 87   Temp 98.3 F (36.8 C) (Oral)   Resp 18   Ht 5\' 11"  (1.803 m)   Wt 88.5 kg   SpO2 100%   BMI 27.20 kg/m  Gen:   Awake, he is uncomfortable Resp:  Normal effort  Abdomen:   Mild left upper tenderness. MSK:   Moves extremities without difficulty no edema Other:  Sitting in a chair slumped over  Medical Decision Making  Medically screening exam initiated at 11:10 PM.  Appropriate orders placed.  was informed that the remainder of the evaluation will be completed by another provider, this initial triage assessment does not replace that evaluation, and the importance of remaining in the ED until their evaluation is complete.  Evaluation for flank pain and chest symptoms.   Dessa Phi, MD 09/28/21 [phone]

## 2021-09-29 LAB — URINALYSIS, ROUTINE W REFLEX MICROSCOPIC
Bacteria, UA: NONE SEEN
Bilirubin Urine: NEGATIVE
Glucose, UA: >=500 mg/dL — AB
Hgb urine dipstick: NEGATIVE
Ketones, ur: NEGATIVE mg/dL
Leukocytes,Ua: NEGATIVE
Nitrite: NEGATIVE
Protein, ur: NEGATIVE mg/dL
Specific Gravity, Urine: 1.02 (ref 1.005–1.030)
pH: 6 (ref 5.0–8.0)

## 2021-09-29 LAB — HEPATIC FUNCTION PANEL
ALT: 17 U/L (ref 0–44)
AST: 21 U/L (ref 15–41)
Albumin: 4.5 g/dL (ref 3.5–5.0)
Alkaline Phosphatase: 83 U/L (ref 38–126)
Bilirubin, Direct: 0.1 mg/dL (ref 0.0–0.2)
Indirect Bilirubin: 0.6 mg/dL (ref 0.3–0.9)
Total Bilirubin: 0.7 mg/dL (ref 0.3–1.2)
Total Protein: 8 g/dL (ref 6.5–8.1)

## 2021-09-29 LAB — LIPASE, BLOOD: Lipase: 85 U/L — ABNORMAL HIGH (ref 11–51)

## 2021-09-29 MED ORDER — SODIUM CHLORIDE 0.9 % IV BOLUS
1000.0000 mL | Freq: Once | INTRAVENOUS | Status: AC
  Administered 2021-09-29: 1000 mL via INTRAVENOUS

## 2021-09-29 MED ORDER — OXYCODONE-ACETAMINOPHEN 5-325 MG PO TABS: 1.0000 | ORAL_TABLET | Freq: Four times a day (QID) | ORAL | 0 refills | Status: DC | PRN

## 2021-09-29 MED ORDER — ONDANSETRON HCL 4 MG/2ML IJ SOLN
4.0000 mg | Freq: Once | INTRAMUSCULAR | Status: AC
  Administered 2021-09-29: 4 mg via INTRAVENOUS
  Filled 2021-09-29: qty 2

## 2021-09-29 MED ORDER — OXYCODONE-ACETAMINOPHEN 5-325 MG PO TABS
2.0000 | ORAL_TABLET | Freq: Once | ORAL | Status: AC
  Administered 2021-09-29: 2 via ORAL
  Filled 2021-09-29: qty 2

## 2021-09-29 MED ORDER — HYDROMORPHONE HCL 1 MG/ML IJ SOLN
1.0000 mg | Freq: Once | INTRAMUSCULAR | Status: AC
  Administered 2021-09-29: 1 mg via INTRAVENOUS
  Filled 2021-09-29: qty 1

## 2021-09-29 NOTE — ED Provider Notes (Signed)
[facility] EMERGENCY DEPARTMENT Provider Note   CSN: [id number] Arrival date & time: 09/28/21  1939     History Chief Complaint  Patient presents with   Flank Pain    Robert Lyons is a 60 y.o. male.  Patient is a 60 year old male with past medical history of hypertension, GERD, type 2 diabetes.  Patient presenting today for evaluation of pain to the left upper quadrant and left side.  This has been worsening over the past week or so.  He describes constant pain that is worse when he presses and moves.  He denies any bowel or bladder complaints.  He denies any fevers or chills.  The history is provided by the patient.  Flank Pain This is a new problem. Episode onset: 1 week ago. The problem occurs constantly. The problem has been gradually worsening. Associated symptoms include abdominal pain. Nothing relieves the symptoms. He has tried nothing for the symptoms.      Past Medical History:  Diagnosis Date   GERD (gastroesophageal reflux disease)    Hypertension    Pneumonia    Type 2 diabetes mellitus (HCC)     Patient Active Problem List   Diagnosis Date Noted   Type II diabetes mellitus, uncontrolled 08/15/2017   Essential hypertension 07/10/2017   Hyperlipidemia 07/10/2017   Cigarette nicotine dependence without complication 07/10/2017    Past Surgical History:  Procedure Laterality Date   No prior surgery         Family History  Problem Relation Age of Onset   Diabetes Mother    Hypertension Mother    Diabetes Maternal Aunt    Diabetes Maternal Uncle     Social History   Tobacco Use   Smoking status: Every Day    Packs/day: 1.00    Years: 15.00    Pack years: 15.00    Types: Cigarettes   Smokeless tobacco: Never  Vaping Use   Vaping Use: Never used  Substance Use Topics   Alcohol use: No    Comment: none since 2014   Drug use: No    Home Medications Prior to Admission medications   Medication Sig Start Date End Date Taking? Authorizing  Provider  lisinopril (ZESTRIL) 40 MG tablet Take 1 tablet (40 mg total) by mouth daily. 08/05/21   Jacquelin Hawking, PA-C  metFORMIN (GLUCOPHAGE) 500 MG tablet Take 1 tablet (500 mg total) by mouth 2 (two) times daily with a meal. 08/05/21   Jacquelin Hawking, PA-C  metoprolol tartrate (LOPRESSOR) 50 MG tablet Take 1 tablet (50 mg total) by mouth 2 (two) times daily. 08/05/21   Jacquelin Hawking, PA-C  nitroGLYCERIN (NITROSTAT) 0.4 MG SL tablet Place 1 tablet (0.4 mg total) under the tongue every 5 (five) minutes as needed for chest pain. 05/27/20   Jacquelin Hawking, PA-C  omeprazole (PRILOSEC) 40 MG capsule Take 1 capsule (40 mg total) by mouth daily. 08/05/21   Jacquelin Hawking, PA-C  sitaGLIPtin (JANUVIA) 100 MG tablet Take 1 tablet (100 mg total) by mouth daily. 08/05/21   Jacquelin Hawking, PA-C    Allergies    Patient has no known allergies.  Review of Systems   Review of Systems  Gastrointestinal:  Positive for abdominal pain.  Genitourinary:  Positive for flank pain.  All other systems reviewed and are negative.  Physical Exam Updated Vital Signs BP 126/66 (BP Location: Right Arm)   Pulse 87   Temp 98.3 F (36.8 C) (Oral)   Resp 18   Ht 5\' 11"  (1.803  m)   Wt 88.5 kg   SpO2 100%   BMI 27.20 kg/m   Physical Exam Vitals and nursing note reviewed.  Constitutional:      General: He is not in acute distress.    Appearance: He is well-developed. He is not diaphoretic.  HENT:     Head: Normocephalic and atraumatic.  Cardiovascular:     Rate and Rhythm: Normal rate and regular rhythm.     Heart sounds: No murmur heard.   No friction rub.  Pulmonary:     Effort: Pulmonary effort is normal. No respiratory distress.     Breath sounds: Normal breath sounds. No wheezing or rales.  Abdominal:     General: Bowel sounds are normal. There is no distension.     Palpations: Abdomen is soft.     Tenderness: There is abdominal tenderness. There is left CVA tenderness.     Comments: There is  tenderness to the left upper quadrant and left lateral abdomen.  Musculoskeletal:        General: Normal range of motion.     Cervical back: Normal range of motion and neck supple.  Skin:    General: Skin is warm and dry.  Neurological:     Mental Status: He is alert and oriented to person, place, and time.     Coordination: Coordination normal.    ED Results / Procedures / Treatments   Labs (all labs ordered are listed, but only abnormal results are displayed) Labs Reviewed  BASIC METABOLIC PANEL - Abnormal; Notable for the following components:      Result Value   Glucose, Bld 339 (*)    All other components within normal limits  CBC - Abnormal; Notable for the following components:   RBC 4.21 (*)    HCT 38.1 (*)    All other components within normal limits  URINALYSIS, ROUTINE W REFLEX MICROSCOPIC  HEPATIC FUNCTION PANEL  LIPASE, BLOOD    EKG EKG Interpretation  Date/Time:  Wednesday September 29 2021 00:31:35 EDT Ventricular Rate:  83 PR Interval:  162 QRS Duration: 90 QT Interval:  370 QTC Calculation: 434 R Axis:   12 Text Interpretation: Normal sinus rhythm Minimal voltage criteria for LVH, may be normal variant ( R in aVL ) Borderline ECG Confirmed by Geoffery Lyons ([id number]) on 09/29/2021 12:36:34 AM  Radiology DG Chest 2 View  Result Date: 09/28/2021 CLINICAL DATA:  Cough EXAM: CHEST - 2 VIEW COMPARISON:  02/23/2014 FINDINGS: The heart size and mediastinal contours are within normal limits. Both lungs are clear. Degenerative changes of the spine. IMPRESSION: No active cardiopulmonary disease. Electronically Signed   By: Jasmine Pang M.D.   On: 09/28/2021 23:31   CT Renal Stone Study  Result Date: 09/28/2021 CLINICAL DATA:  Left-sided pain EXAM: CT ABDOMEN AND PELVIS WITHOUT CONTRAST TECHNIQUE: Multidetector CT imaging of the abdomen and pelvis was performed following the standard protocol without IV contrast. COMPARISON:  09/16/2011 CT FINDINGS: Lower chest: No  acute abnormality. Hepatobiliary: No focal liver abnormality is seen. No gallstones, gallbladder wall thickening, or biliary dilatation. Pancreas: Subtle fat stranding about the body of the pancreas, suspicious for mild pancreatitis. No organized fluid collections. Spleen: Normal in size without focal abnormality. Adrenals/Urinary Tract: Adrenal glands are normal. Kidneys show no hydronephrosis. Low-density lesions within the bilateral kidneys are probably cysts but unable to characterize without contrast. The bladder is unremarkable. Stomach/Bowel: Stomach is within normal limits. Appendix appears normal. No evidence of bowel wall thickening, distention, or inflammatory changes.  Vascular/Lymphatic: Mild aortic atherosclerosis. No aneurysm. No suspicious nodes Reproductive: Prostate is unremarkable. Other: Negative for pelvic effusion or free air Musculoskeletal: No acute or significant osseous findings. IMPRESSION: 1. Negative for hydronephrosis or ureteral stone. 2. Suspicion of subtle edema/fat stranding at the pancreatic body as may be seen with mild pancreatitis. Suggest correlation with enzymes. Electronically Signed   By: Jasmine Pang M.D.   On: 09/28/2021 23:36    Procedures Procedures   Medications Ordered in ED Medications  oxyCODONE-acetaminophen (PERCOCET/ROXICET) 5-325 MG per tablet 2 tablet (has no administration in time range)    ED Course  I have reviewed the triage vital signs and the nursing notes.  Pertinent labs & imaging results that were available during my care of the patient were reviewed by me and considered in my medical decision making (see chart for details).    MDM Rules/Calculators/A&P  Patient presenting here with left-sided abdominal and flank pain that seems to be related to subtle pancreatitis.  His lipase is slightly elevated at 85, but laboratory studies are otherwise unremarkable.  He seems to be feeling better after receiving IV fluids and pain medicine.   Patient is nontoxic-appearing and I feel as though discharge is appropriate.  He is to adhere to a clear liquid diet, take pain medication as prescribed, and follow-up with primary doctor if not improving.  Final Clinical Impression(s) / ED Diagnoses Final diagnoses:  None    Rx / DC Orders ED Discharge Orders     None        Geoffery Lyons, MD 09/29/21 [phone]

## 2021-09-29 NOTE — Discharge Instructions (Addendum)
Clear liquid diet for the next 48 hours, then slowly advance diet as tolerated.  Take Percocet as prescribed as needed for pain.  Return to the emergency department if you develop worsening pain, high fever, bloody stool or vomit, or other new and concerning symptoms.

## 2021-10-20 ENCOUNTER — Other Ambulatory Visit: Payer: Self-pay | Admitting: Physician Assistant

## 2021-10-20 DIAGNOSIS — E785 Hyperlipidemia, unspecified: Secondary | ICD-10-CM

## 2021-10-20 DIAGNOSIS — E1165 Type 2 diabetes mellitus with hyperglycemia: Secondary | ICD-10-CM

## 2021-10-20 DIAGNOSIS — I10 Essential (primary) hypertension: Secondary | ICD-10-CM

## 2021-11-03 ENCOUNTER — Ambulatory Visit: Payer: Self-pay | Admitting: Physician Assistant

## 2021-11-03 ENCOUNTER — Encounter: Payer: Self-pay | Admitting: Physician Assistant

## 2021-11-03 VITALS — BP 158/83 | HR 101 | Temp 97.3°F | Wt 188.5 lb

## 2021-11-03 DIAGNOSIS — F17200 Nicotine dependence, unspecified, uncomplicated: Secondary | ICD-10-CM

## 2021-11-03 DIAGNOSIS — E785 Hyperlipidemia, unspecified: Secondary | ICD-10-CM

## 2021-11-03 DIAGNOSIS — E1165 Type 2 diabetes mellitus with hyperglycemia: Secondary | ICD-10-CM

## 2021-11-03 DIAGNOSIS — I10 Essential (primary) hypertension: Secondary | ICD-10-CM

## 2021-11-03 DIAGNOSIS — Z9114 Patient's other noncompliance with medication regimen: Secondary | ICD-10-CM

## 2021-11-03 NOTE — Progress Notes (Signed)
BP (!) 158/83   Pulse (!) 101   Temp (!) 97.3 F (36.3 C)   Wt 188 lb 8 oz (85.5 kg)   SpO2 97%   BMI 26.29 kg/m    Subjective:    Patient ID: Robert Lyons, male    DOB: 1961-11-03, 60 y.o.   MRN: [id number]  HPI: Robert Lyons is a 60 y.o. male presenting on 11/03/2021 for Hypertension, Hyperlipidemia, and Diabetes   HPI   Chief Complaint  Patient presents with   Hypertension   Hyperlipidemia   Diabetes    Pt stopped all his meds for about a week due to abd cramping.  His stomach is back to normal now.    He renewed with [facility] he Denies anxiety / depression He says he is doing well    Relevant past medical, surgical, family and social history reviewed and updated as indicated. Interim medical history since our last visit reviewed. Allergies and medications reviewed and updated.    CURRENT MEDS: None  Prescribed meds:  Current Outpatient Medications:    lisinopril (ZESTRIL) 40 MG tablet, Take 1 tablet (40 mg total) by mouth daily. (Patient not taking: Reported on 11/03/2021), Disp: 90 tablet, Rfl: 0   metFORMIN (GLUCOPHAGE) 500 MG tablet, Take 1 tablet (500 mg total) by mouth 2 (two) times daily with a meal. (Patient not taking: Reported on 11/03/2021), Disp: 180 tablet, Rfl: 0   metoprolol tartrate (LOPRESSOR) 50 MG tablet, Take 1 tablet (50 mg total) by mouth 2 (two) times daily. (Patient not taking: Reported on 11/03/2021), Disp: 180 tablet, Rfl: 0   nitroGLYCERIN (NITROSTAT) 0.4 MG SL tablet, Place 1 tablet (0.4 mg total) under the tongue every 5 (five) minutes as needed for chest pain. (Patient not taking: Reported on 11/03/2021), Disp: 25 tablet, Rfl: 3   omeprazole (PRILOSEC) 40 MG capsule, Take 1 capsule (40 mg total) by mouth daily. (Patient not taking: Reported on 11/03/2021), Disp: 90 capsule, Rfl: 0   oxyCODONE-acetaminophen (PERCOCET) 5-325 MG tablet, Take 1-2 tablets by mouth every 6 (six) hours as needed. (Patient not taking: Reported on  11/03/2021), Disp: 15 tablet, Rfl: 0   sitaGLIPtin (JANUVIA) 100 MG tablet, Take 1 tablet (100 mg total) by mouth daily. (Patient not taking: Reported on 11/03/2021), Disp: 90 tablet, Rfl: 0   Review of Systems  Per HPI unless specifically indicated above     Objective:    BP (!) 158/83   Pulse (!) 101   Temp (!) 97.3 F (36.3 C)   Wt 188 lb 8 oz (85.5 kg)   SpO2 97%   BMI 26.29 kg/m   Wt Readings from Last 3 Encounters:  11/03/21 188 lb 8 oz (85.5 kg)  09/28/21 195 lb (88.5 kg)  08/05/21 193 lb (87.5 kg)    Physical Exam Vitals reviewed.  Constitutional:      General: He is not in acute distress.    Appearance: He is well-developed. He is not ill-appearing.  HENT:     Head: Normocephalic and atraumatic.  Cardiovascular:     Rate and Rhythm: Normal rate and regular rhythm.  Pulmonary:     Effort: Pulmonary effort is normal.     Breath sounds: Normal breath sounds. No wheezing.  Abdominal:     General: Bowel sounds are normal.     Palpations: Abdomen is soft.     Tenderness: There is no abdominal tenderness.  Musculoskeletal:     Cervical back: Neck supple.     Right lower  leg: No edema.     Left lower leg: No edema.  Lymphadenopathy:     Cervical: No cervical adenopathy.  Skin:    General: Skin is warm and dry.  Neurological:     Mental Status: He is alert and oriented to person, place, and time.  Psychiatric:        Behavior: Behavior normal.          Assessment & Plan:    Encounter Diagnoses  Name Primary?   Uncontrolled type 2 diabetes mellitus with hyperglycemia (HCC) Yes   Essential hypertension    Hyperlipidemia, unspecified hyperlipidemia type    Not taking medication as directed    Tobacco use disorder        -pt to Restart all meds except metformin.  After 4 or 5 days, restart metformin He is to contact office if he has any stomach problems! -he Already got flu shot -Will just wait on labs since he's been off all his meds -pt to  follow up 3 months.  He is to contact office sooner for any problems

## 2021-11-04 ENCOUNTER — Ambulatory Visit: Payer: Self-pay | Admitting: Physician Assistant

## 2022-02-03 ENCOUNTER — Ambulatory Visit: Payer: Self-pay | Admitting: Physician Assistant

## 2022-02-15 ENCOUNTER — Telehealth: Payer: Self-pay

## 2022-02-15 NOTE — Telephone Encounter (Signed)
Spoke with pt who wanted to confirm appt, informed appt is 02/16/22 at 130pm. Pt states he has not had labs done, informed he can get labs done before appt, pt states he is feeling bad but not sick & says he may not have a ride to appt tomorrow, informed to let us know if he cannot make appt so he does not get No Show, pt voiced understanding

## 2022-02-16 ENCOUNTER — Ambulatory Visit: Payer: Self-pay | Admitting: Physician Assistant

## 2022-02-23 ENCOUNTER — Encounter (HOSPITAL_COMMUNITY): Payer: Self-pay

## 2022-02-23 ENCOUNTER — Other Ambulatory Visit: Payer: Self-pay

## 2022-02-23 ENCOUNTER — Emergency Department (HOSPITAL_COMMUNITY): Payer: Self-pay

## 2022-02-23 ENCOUNTER — Emergency Department (HOSPITAL_COMMUNITY)
Admission: EM | Admit: 2022-02-23 | Discharge: 2022-02-23 | Disposition: A | Discharge status: Home / Self Care | Payer: Self-pay | Attending: Emergency Medicine | Admitting: Emergency Medicine

## 2022-02-23 DIAGNOSIS — R110 Nausea: Secondary | ICD-10-CM | POA: Insufficient documentation

## 2022-02-23 DIAGNOSIS — Z79899 Other long term (current) drug therapy: Secondary | ICD-10-CM | POA: Insufficient documentation

## 2022-02-23 DIAGNOSIS — R000 Tachycardia, unspecified: Secondary | ICD-10-CM | POA: Insufficient documentation

## 2022-02-23 DIAGNOSIS — I10 Essential (primary) hypertension: Secondary | ICD-10-CM | POA: Insufficient documentation

## 2022-02-23 DIAGNOSIS — R0789 Other chest pain: Secondary | ICD-10-CM | POA: Insufficient documentation

## 2022-02-23 DIAGNOSIS — R079 Chest pain, unspecified: Secondary | ICD-10-CM

## 2022-02-23 DIAGNOSIS — F17200 Nicotine dependence, unspecified, uncomplicated: Secondary | ICD-10-CM | POA: Insufficient documentation

## 2022-02-23 DIAGNOSIS — Z20822 Contact with and (suspected) exposure to covid-19: Secondary | ICD-10-CM | POA: Insufficient documentation

## 2022-02-23 DIAGNOSIS — E119 Type 2 diabetes mellitus without complications: Secondary | ICD-10-CM | POA: Insufficient documentation

## 2022-02-23 DIAGNOSIS — R42 Dizziness and giddiness: Secondary | ICD-10-CM | POA: Insufficient documentation

## 2022-02-23 DIAGNOSIS — Z7984 Long term (current) use of oral hypoglycemic drugs: Secondary | ICD-10-CM | POA: Insufficient documentation

## 2022-02-23 LAB — BASIC METABOLIC PANEL
Anion gap: 11 (ref 5–15)
BUN: 10 mg/dL (ref 8–23)
CO2: 23 mmol/L (ref 22–32)
Calcium: 9.4 mg/dL (ref 8.9–10.3)
Chloride: 102 mmol/L (ref 98–111)
Creatinine, Ser: 0.97 mg/dL (ref 0.61–1.24)
GFR, Estimated: >60 mL/min (ref >60)
Glucose, Bld: 300 mg/dL — ABNORMAL HIGH (ref 70–99)
Potassium: 3.7 mmol/L (ref 3.5–5.1)
Sodium: 136 mmol/L (ref 135–145)

## 2022-02-23 LAB — CBC
HCT: 39.7 % (ref 39.0–52.0)
Hemoglobin: 13.2 g/dL (ref 13.0–17.0)
MCH: 30 pg (ref 26.0–34.0)
MCHC: 33.2 g/dL (ref 30.0–36.0)
MCV: 90.2 fL (ref 80.0–100.0)
Platelets: 219 10*3/uL (ref 150–400)
RBC: 4.4 MIL/uL (ref 4.22–5.81)
RDW: 12.3 % (ref 11.5–15.5)
WBC: 7.7 10*3/uL (ref 4.0–10.5)
nRBC: 0 % (ref 0.0–0.2)

## 2022-02-23 LAB — RESP PANEL BY RT-PCR (FLU A&B, COVID) ARPGX2
Influenza A by PCR: NEGATIVE
Influenza B by PCR: NEGATIVE
SARS Coronavirus 2 by RT PCR: NEGATIVE

## 2022-02-23 LAB — TROPONIN I (HIGH SENSITIVITY)
Troponin I (High Sensitivity): 3 ng/L (ref <18)
Troponin I (High Sensitivity): 3 ng/L (ref <18)

## 2022-02-23 MED ORDER — CYCLOBENZAPRINE HCL 10 MG PO TABS: 10.0000 mg | ORAL_TABLET | Freq: Two times a day (BID) | ORAL | 0 refills | Status: DC | PRN

## 2022-02-23 MED ORDER — CYCLOBENZAPRINE HCL 10 MG PO TABS
10.0000 mg | ORAL_TABLET | Freq: Once | ORAL | Status: AC
  Administered 2022-02-23: 10 mg via ORAL
  Filled 2022-02-23: qty 1

## 2022-02-23 MED ORDER — HYDROMORPHONE HCL 1 MG/ML IJ SOLN
0.5000 mg | Freq: Once | INTRAMUSCULAR | Status: AC
  Administered 2022-02-23: 0.5 mg via INTRAVENOUS
  Filled 2022-02-23: qty 1

## 2022-02-23 MED ORDER — ASPIRIN 325 MG PO TABS
325.0000 mg | ORAL_TABLET | Freq: Once | ORAL | Status: AC
  Administered 2022-02-23: 325 mg via ORAL
  Filled 2022-02-23: qty 1

## 2022-02-23 NOTE — ED Provider Notes (Signed)
[facility] EMERGENCY DEPARTMENT Provider Note   CSN: [id number] Arrival date & time: 02/23/22  1305     History  Chief Complaint  Patient presents with   Chest Pain    Robert Lyons is a 61 y.o. male.  HPI  Patient with medical history including diabetes, hypertension, current tobacco user presents with complaints of chest pain.  Patient is a started 3 days ago, started while he was sitting on his couch watching TV states pain is in the middle of the chest and left chest will occasionally go into his back, pain was initially episodic but now become more frequent, patient states pain is worsened with movements particularly with walking or moving his upper extremities, describes as a pulse like sensation, not a tearing like or stabbing-like sensation, does not radiate into his left arm or neck, will occasion become hot but denies become diaphoretic will occasion become lightheaded and dizzy and feels some nausea he states he has had this in the past he states it feels the same but this is more frequent than usual.  He denies any illicit drug use does not drink alcohol, he has no cardiac history, no connective tissue disorders, no history of dissection or AAA.  I have reviewed patient's chart was seen by Dr. Diona Browner of cardiology in February of last year he had a echo as well as a stress test both which were negative for acute findings patient's presentation was inconsistent with CAD.  Home Medications Prior to Admission medications   Medication Sig Start Date End Date Taking? Authorizing Provider  cyclobenzaprine (FLEXERIL) 10 MG tablet Take 1 tablet (10 mg total) by mouth 2 (two) times daily as needed for muscle spasms. 02/23/22  Yes Carroll Sage, PA-C  lisinopril (ZESTRIL) 40 MG tablet Take 1 tablet (40 mg total) by mouth daily. Patient not taking: Reported on 11/03/2021 08/05/21   Jacquelin Hawking, PA-C  metFORMIN (GLUCOPHAGE) 500 MG tablet Take 1 tablet (500 mg total) by mouth  2 (two) times daily with a meal. Patient not taking: Reported on 11/03/2021 08/05/21   Jacquelin Hawking, PA-C  metoprolol tartrate (LOPRESSOR) 50 MG tablet Take 1 tablet (50 mg total) by mouth 2 (two) times daily. Patient not taking: Reported on 11/03/2021 08/05/21   Jacquelin Hawking, PA-C  nitroGLYCERIN (NITROSTAT) 0.4 MG SL tablet Place 1 tablet (0.4 mg total) under the tongue every 5 (five) minutes as needed for chest pain. Patient not taking: Reported on 11/03/2021 05/27/20   Jacquelin Hawking, PA-C  omeprazole (PRILOSEC) 40 MG capsule Take 1 capsule (40 mg total) by mouth daily. Patient not taking: Reported on 11/03/2021 08/05/21   Jacquelin Hawking, PA-C  oxyCODONE-acetaminophen (PERCOCET) 5-325 MG tablet Take 1-2 tablets by mouth every 6 (six) hours as needed. Patient not taking: Reported on 11/03/2021 09/29/21   Geoffery Lyons, MD  sitaGLIPtin (JANUVIA) 100 MG tablet Take 1 tablet (100 mg total) by mouth daily. Patient not taking: Reported on 11/03/2021 08/05/21   Jacquelin Hawking, PA-C      Allergies    Patient has no known allergies.    Review of Systems   Review of Systems  Constitutional:  Negative for chills and fever.  Respiratory:  Positive for shortness of breath.   Cardiovascular:  Positive for chest pain.  Gastrointestinal:  Negative for abdominal pain, nausea and vomiting.  Neurological:  Negative for headaches.   Physical Exam Updated Vital Signs BP (!) 153/80    Pulse 90    Temp 98 F (36.7 C) (  Oral)    Resp 20    Wt 88.5 kg    SpO2 97%    BMI 27.20 kg/m  Physical Exam Vitals and nursing note reviewed.  Constitutional:      General: He is not in acute distress.    Appearance: He is not ill-appearing.  HENT:     Head: Normocephalic and atraumatic.     Nose: No congestion.  Eyes:     Conjunctiva/sclera: Conjunctivae normal.  Cardiovascular:     Rate and Rhythm: Regular rhythm. Tachycardia present.     Pulses: Normal pulses.     Heart sounds: No murmur heard.   No  friction rub. No gallop.  Pulmonary:     Effort: No respiratory distress.     Breath sounds: No wheezing, rhonchi or rales.     Comments: Patient chest pain was focalized and reproducible, noted pain on his mid sternum as well as left pectoral muscle, there is no overlying skin changes no ecchymosis no other gross hemorrhage present. Abdominal:     Palpations: Abdomen is soft.     Tenderness: There is no abdominal tenderness. There is no right CVA tenderness or left CVA tenderness.  Musculoskeletal:     Right lower leg: No edema.     Left lower leg: No edema.  Skin:    General: Skin is warm and dry.  Neurological:     Mental Status: He is alert.  Psychiatric:        Mood and Affect: Mood normal.    ED Results / Procedures / Treatments   Labs (all labs ordered are listed, but only abnormal results are displayed) Labs Reviewed  BASIC METABOLIC PANEL - Abnormal; Notable for the following components:      Result Value   Glucose, Bld 300 (*)    All other components within normal limits  RESP PANEL BY RT-PCR (FLU A&B, COVID) ARPGX2  CBC  TROPONIN I (HIGH SENSITIVITY)  TROPONIN I (HIGH SENSITIVITY)    EKG EKG Interpretation  Date/Time:  Wednesday February 23 2022 13:14:09 EST Ventricular Rate:  103 PR Interval:  162 QRS Duration: 96 QT Interval:  350 QTC Calculation: 459 R Axis:   28 Text Interpretation: Sinus tachycardia since last tracing no significant change Confirmed by Eber Hong ([id number]) on 02/23/2022 1:21:23 PM  Radiology DG Chest Port 1 View  Result Date: 02/23/2022 CLINICAL DATA:  Intermittent chest pain over the last 3 days EXAM: PORTABLE CHEST 1 VIEW COMPARISON:  Chest radiograph 09/28/2021 FINDINGS: The cardiomediastinal silhouette is normal. There is no focal consolidation or pulmonary edema. There is no pleural effusion or pneumothorax. There is no acute osseous abnormality. IMPRESSION: No radiographic evidence of acute cardiopulmonary process. Electronically  Signed   By: Lesia Hausen M.D.   On: 02/23/2022 13:39    Procedures Procedures    Medications Ordered in ED Medications  aspirin tablet 325 mg (325 mg Oral Given 02/23/22 1410)  HYDROmorphone (DILAUDID) injection 0.5 mg (0.5 mg Intravenous Given 02/23/22 1411)  cyclobenzaprine (FLEXERIL) tablet 10 mg (10 mg Oral Given 02/23/22 1410)    ED Course/ Medical Decision Making/ A&P                           Medical Decision Making Amount and/or Complexity of Data Reviewed Labs: ordered. Radiology: ordered.  Risk OTC drugs. Prescription drug management.   This patient presents to the ED for concern of chest pain, this involves an extensive number of treatment  options, and is a complaint that carries with it a high risk of complications and morbidity.  The differential diagnosis includes ACS, PE, dissection, pneumonia    Additional history obtained:  Additional history obtained from electronic medical record External records from outside source obtained and reviewed including please see HPI for further detail   Co morbidities that complicate the patient evaluation  Diabetes, hypertension, tobacco user  Social Determinants of Health:  N/A    Lab Tests:  I Ordered, and personally interpreted labs.  The pertinent results include: CBC unremarkable,   Imaging Studies ordered:  I ordered imaging studies including chest x-ray I independently visualized and interpreted imaging which showed unremarkable I agree with the radiologist interpretation   Cardiac Monitoring:  The patient was maintained on a cardiac monitor.  I personally viewed and interpreted the cardiac monitored which showed an underlying rhythm of: EKG sinus tach without signs of ischemia   Medicines ordered and prescription drug management:  I ordered medication including aspirin, Flexeril, Dilaudid for chest pain I have reviewed the patients home medicines and have made adjustments as  needed   Reevaluation:  During my exam patient was having chest pain was reproducible and focalized likely this is muscular will provide with pain medications and reassess.  Patient was reassessed she was found resting comfortably, vital signs have improved heart rate in the mid to low 80s, showing no signs of acute distress he states he is feeling better, still has slight pain we will continue to monitor  Reassessed updated lab or imaging still there is resting comfortably no signs acute distress he has no complaints is ready for discharge.   Rule out I have low suspicion for ACS as history is atypical i.e. pain is focalized reproducible and also had echo as well as a stress test last year which was negative.  EKG was sinus rhythm without signs of ischemia, patient had negative delta troponin.  Low suspicion for PE patient is nontachypneic nonhypoxic, he was tachycardic on arrival but this has since resolved I suspect this was likely secondary due to pain.  Patient is not endorsing pleuritic chest pain no unilateral leg swelling not on anticoag's no history of Pes, his pain was also reproducible on my exam.  Low suspicion for AAA or aortic dissection as history is atypical, patient has low risk factors.  Low suspicion for systemic infection as patient is nontoxic-appearing, vital signs reassuring, no obvious source infection noted on exam.     Dispostion and problem list  After consideration of the diagnostic results and the patients response to treatment, I feel that the patent would benefit from discharge.  Chest pain since resolved-very low suspicion for CAD at this time I suspect this is more muscular in nature, will fight with a muscle relaxer, have him follow-up with his PCP and her cardiologist for further evaluation.  Strict return precautions.            Final Clinical Impression(s) / ED Diagnoses Final diagnoses:  Atypical chest pain    Rx / DC Orders ED Discharge  Orders          Ordered    cyclobenzaprine (FLEXERIL) 10 MG tablet  2 times daily PRN        02/23/22 1718              Carroll Sage, PA-C 02/23/22 1719    Eber Hong, MD 02/25/22 [phone]

## 2022-02-23 NOTE — ED Triage Notes (Signed)
Chest pain intermittent over last three days, became constant today 10/10 accompanied by nausea, dizziness, lightheadedness, sob, diaphoresis at times.   ?

## 2022-02-23 NOTE — Discharge Instructions (Signed)
Lab work and imaging reassuring given you a muscle relaxer please take as prescribed you may also use over-the-counter pain medication as needed.  Please try to stop smoking as this can also anterior chest pain. ? ?Please follow-up with your PCP and her cardiologist. ? ?Come back to the emergency department if you develop chest pain, shortness of breath, severe abdominal pain, uncontrolled nausea, vomiting, diarrhea. ? ?

## 2022-02-24 ENCOUNTER — Telehealth: Payer: Self-pay

## 2022-02-24 NOTE — Telephone Encounter (Signed)
Call from pt to inform he was just released from hospital & to ask when appt is, informed pt next appt 03/23/22 at 2pm. ?

## 2022-03-23 ENCOUNTER — Ambulatory Visit: Payer: Self-pay | Admitting: Physician Assistant

## 2022-03-23 ENCOUNTER — Encounter: Payer: Self-pay | Admitting: Physician Assistant

## 2022-03-23 ENCOUNTER — Other Ambulatory Visit (HOSPITAL_COMMUNITY)
Admission: RE | Admit: 2022-03-23 | Discharge: 2022-03-23 | Disposition: A | Discharge status: Home / Self Care | Payer: Self-pay | Source: Ambulatory Visit | Attending: Physician Assistant | Admitting: Physician Assistant

## 2022-03-23 VITALS — BP 178/81 | HR 103 | Temp 97.3°F | Wt 202.0 lb

## 2022-03-23 DIAGNOSIS — E785 Hyperlipidemia, unspecified: Secondary | ICD-10-CM

## 2022-03-23 DIAGNOSIS — I10 Essential (primary) hypertension: Secondary | ICD-10-CM | POA: Insufficient documentation

## 2022-03-23 DIAGNOSIS — Z9114 Patient's other noncompliance with medication regimen: Secondary | ICD-10-CM

## 2022-03-23 DIAGNOSIS — E1165 Type 2 diabetes mellitus with hyperglycemia: Secondary | ICD-10-CM

## 2022-03-23 DIAGNOSIS — F17200 Nicotine dependence, unspecified, uncomplicated: Secondary | ICD-10-CM

## 2022-03-23 LAB — COMPREHENSIVE METABOLIC PANEL
ALT: 45 U/L — ABNORMAL HIGH (ref 0–44)
AST: 65 U/L — ABNORMAL HIGH (ref 15–41)
Albumin: 4.3 g/dL (ref 3.5–5.0)
Alkaline Phosphatase: 69 U/L (ref 38–126)
Anion gap: 10 (ref 5–15)
BUN: 9 mg/dL (ref 8–23)
CO2: 28 mmol/L (ref 22–32)
Calcium: 9.5 mg/dL (ref 8.9–10.3)
Chloride: 102 mmol/L (ref 98–111)
Creatinine, Ser: 1.04 mg/dL (ref 0.61–1.24)
GFR, Estimated: >60 mL/min (ref >60)
Glucose, Bld: 274 mg/dL — ABNORMAL HIGH (ref 70–99)
Potassium: 4.1 mmol/L (ref 3.5–5.1)
Sodium: 140 mmol/L (ref 135–145)
Total Bilirubin: 0.9 mg/dL (ref 0.3–1.2)
Total Protein: 7.6 g/dL (ref 6.5–8.1)

## 2022-03-23 LAB — LIPID PANEL
Cholesterol: 205 mg/dL — ABNORMAL HIGH (ref 0–200)
HDL: 42 mg/dL (ref >40)
LDL Cholesterol: 123 mg/dL — ABNORMAL HIGH (ref 0–99)
Total CHOL/HDL Ratio: 4.9 RATIO
Triglycerides: 198 mg/dL — ABNORMAL HIGH (ref <150)
VLDL: 40 mg/dL (ref 0–40)

## 2022-03-23 LAB — HEMOGLOBIN A1C
Hgb A1c MFr Bld: 7.9 % — ABNORMAL HIGH (ref 4.8–5.6)
Mean Plasma Glucose: 180.03 mg/dL

## 2022-03-23 MED ORDER — METOPROLOL TARTRATE 50 MG PO TABS: 50.0000 mg | ORAL_TABLET | Freq: Two times a day (BID) | ORAL | 0 refills | Status: DC

## 2022-03-23 MED ORDER — OMEPRAZOLE 40 MG PO CPDR: 40.0000 mg | DELAYED_RELEASE_CAPSULE | Freq: Every day | ORAL | 0 refills | Status: DC

## 2022-03-23 MED ORDER — METFORMIN HCL 500 MG PO TABS: 500.0000 mg | ORAL_TABLET | Freq: Two times a day (BID) | ORAL | 0 refills | Status: DC

## 2022-03-23 MED ORDER — NITROGLYCERIN 0.4 MG SL SUBL
0.4000 mg | SUBLINGUAL_TABLET | SUBLINGUAL | 99 refills | Status: DC | PRN
Start: 2022-03-23 — End: 2022-12-02

## 2022-03-23 MED ORDER — SITAGLIPTIN PHOSPHATE 100 MG PO TABS: 100.0000 mg | ORAL_TABLET | Freq: Every day | ORAL | 0 refills | Status: DC

## 2022-03-23 MED ORDER — LISINOPRIL 40 MG PO TABS: 40.0000 mg | ORAL_TABLET | Freq: Every day | ORAL | 0 refills | Status: DC

## 2022-03-23 NOTE — Progress Notes (Signed)
? ?BP (!) 178/81   Pulse (!) 103   Temp (!) 97.3 ?F (36.3 ?C)   Wt 202 lb (91.6 kg)   SpO2 97%   BMI 28.17 kg/m?   ? ?Subjective:  ? ? Patient ID: Robert Lyons, male    DOB: 04-17-1961, 61 y.o.   MRN: [id number] ? ?HPI: ?Robert Lyons is a 61 y.o. male presenting on 03/23/2022 for Diabetes, Hyperlipidemia, and Hypertension ? ? ?HPI ? ? ?Chief Complaint  ?Patient presents with  ? Diabetes  ? Hyperlipidemia  ? Hypertension  ? ? ?Pt ran out of all his meds for maybe 2 or 3 weeks ? ?Pt says he is doing fine.  He says he has No  cp.   Some sob but he continues to smoke.  ? ?Pt went to ER 3/1 with cp.    He is not having any pain now.  He saw cardiologist last year and had negative echo and lexiscan myoview ? ? ? ? ?Relevant past medical, surgical, family and social history reviewed and updated as indicated. Interim medical history since our last visit reviewed. ?Allergies and medications reviewed and updated. ? ? ?Current Outpatient Medications:  ?  cyclobenzaprine (FLEXERIL) 10 MG tablet, Take 1 tablet (10 mg total) by mouth 2 (two) times daily as needed for muscle spasms., Disp: 20 tablet, Rfl: 0 ?  lisinopril (ZESTRIL) 40 MG tablet, Take 1 tablet (40 mg total) by mouth daily., Disp: 90 tablet, Rfl: 0 ?  metoprolol tartrate (LOPRESSOR) 50 MG tablet, Take 1 tablet (50 mg total) by mouth 2 (two) times daily., Disp: 180 tablet, Rfl: 0 ?  metFORMIN (GLUCOPHAGE) 500 MG tablet, Take 1 tablet (500 mg total) by mouth 2 (two) times daily with a meal. (Patient not taking: Reported on 03/23/2022), Disp: 180 tablet, Rfl: 0 ?  nitroGLYCERIN (NITROSTAT) 0.4 MG SL tablet, Place 1 tablet (0.4 mg total) under the tongue every 5 (five) minutes as needed for chest pain. (Patient not taking: Reported on 11/03/2021), Disp: 25 tablet, Rfl: 3 ?  omeprazole (PRILOSEC) 40 MG capsule, Take 1 capsule (40 mg total) by mouth daily. (Patient not taking: Reported on 11/03/2021), Disp: 90 capsule, Rfl: 0 ?  oxyCODONE-acetaminophen  (PERCOCET) 5-325 MG tablet, Take 1-2 tablets by mouth every 6 (six) hours as needed. (Patient not taking: Reported on 11/03/2021), Disp: 15 tablet, Rfl: 0 ?  sitaGLIPtin (JANUVIA) 100 MG tablet, Take 1 tablet (100 mg total) by mouth daily. (Patient not taking: Reported on 03/23/2022), Disp: 90 tablet, Rfl: 0 ? ? ? ? ?Review of Systems ? ?Per HPI unless specifically indicated above ? ?   ?Objective:  ?  ?BP (!) 178/81   Pulse (!) 103   Temp (!) 97.3 ?F (36.3 ?C)   Wt 202 lb (91.6 kg)   SpO2 97%   BMI 28.17 kg/m?   ?Wt Readings from Last 3 Encounters:  ?03/23/22 202 lb (91.6 kg)  ?02/23/22 195 lb (88.5 kg)  ?11/03/21 188 lb 8 oz (85.5 kg)  ?  ?Physical Exam ?Vitals reviewed.  ?Constitutional:   ?   General: He is not in acute distress. ?   Appearance: He is well-developed. He is not ill-appearing.  ?HENT:  ?   Head: Normocephalic and atraumatic.  ?Cardiovascular:  ?   Rate and Rhythm: Normal rate and regular rhythm.  ?Pulmonary:  ?   Effort: Pulmonary effort is normal.  ?   Breath sounds: Normal breath sounds. No wheezing.  ?Abdominal:  ?   General:  Bowel sounds are normal.  ?   Palpations: Abdomen is soft.  ?   Tenderness: There is no abdominal tenderness.  ?Musculoskeletal:  ?   Cervical back: Neck supple.  ?   Right lower leg: No edema.  ?   Left lower leg: No edema.  ?Lymphadenopathy:  ?   Cervical: No cervical adenopathy.  ?Skin: ?   General: Skin is warm and dry.  ?Neurological:  ?   Mental Status: He is alert and oriented to person, place, and time.  ?Psychiatric:     ?   Behavior: Behavior normal.  ? ? ?Results for orders placed or performed during the hospital encounter of 03/23/22  ?Comprehensive metabolic panel  ?Result Value Ref Range  ? Sodium 140 135 - 145 mmol/L  ? Potassium 4.1 3.5 - 5.1 mmol/L  ? Chloride 102 98 - 111 mmol/L  ? CO2 28 22 - 32 mmol/L  ? Glucose, Bld 274 (H) 70 - 99 mg/dL  ? BUN 9 8 - 23 mg/dL  ? Creatinine, Ser 1.04 0.61 - 1.24 mg/dL  ? Calcium 9.5 8.9 - 10.3 mg/dL  ? Total Protein  7.6 6.5 - 8.1 g/dL  ? Albumin 4.3 3.5 - 5.0 g/dL  ? AST 65 (H) 15 - 41 U/L  ? ALT 45 (H) 0 - 44 U/L  ? Alkaline Phosphatase 69 38 - 126 U/L  ? Total Bilirubin 0.9 0.3 - 1.2 mg/dL  ? GFR, Estimated >60 >60 mL/min  ? Anion gap 10 5 - 15  ?Lipid panel  ?Result Value Ref Range  ? Cholesterol 205 (H) 0 - 200 mg/dL  ? Triglycerides 198 (H) <150 mg/dL  ? HDL 42 >40 mg/dL  ? Total CHOL/HDL Ratio 4.9 RATIO  ? VLDL 40 0 - 40 mg/dL  ? LDL Cholesterol 123 (H) 0 - 99 mg/dL  ? ?   ?Assessment & Plan:  ? ? ?Encounter Diagnoses  ?Name Primary?  ? Uncontrolled type 2 diabetes mellitus with hyperglycemia (HCC) Yes  ? Essential hypertension   ? Hyperlipidemia, unspecified hyperlipidemia type   ? Not taking medication as directed   ? Tobacco use disorder   ? ? ? ?-reviewed labs with pt.  A1c is pending.  He will be called with that result when available ?-pt was Counseled to avoid running out of meds ?-Refer for [facility] eye exam ([place] only) ?-Discussed ntg.  Discussed that he had normal cardiac evaluation last year so pain was not likely cardiac. He wants rx anyway so it is given ?-counseled Smoking cessation and gave information including on a cessation class ?-pt to follow up  3 months.  He is to contact office sooner prn ?

## 2022-06-14 ENCOUNTER — Other Ambulatory Visit: Payer: Self-pay | Admitting: Physician Assistant

## 2022-06-14 DIAGNOSIS — E1165 Type 2 diabetes mellitus with hyperglycemia: Secondary | ICD-10-CM

## 2022-06-14 DIAGNOSIS — I10 Essential (primary) hypertension: Secondary | ICD-10-CM

## 2022-06-14 DIAGNOSIS — Z125 Encounter for screening for malignant neoplasm of prostate: Secondary | ICD-10-CM

## 2022-06-14 DIAGNOSIS — E785 Hyperlipidemia, unspecified: Secondary | ICD-10-CM

## 2022-06-27 ENCOUNTER — Ambulatory Visit: Payer: Self-pay | Admitting: Physician Assistant

## 2022-07-06 ENCOUNTER — Ambulatory Visit: Payer: Self-pay | Admitting: Physician Assistant

## 2022-07-06 ENCOUNTER — Encounter: Payer: Self-pay | Admitting: Physician Assistant

## 2022-07-06 VITALS — BP 126/66 | HR 83 | Temp 96.7°F | Wt 197.0 lb

## 2022-07-06 DIAGNOSIS — E785 Hyperlipidemia, unspecified: Secondary | ICD-10-CM

## 2022-07-06 DIAGNOSIS — E1165 Type 2 diabetes mellitus with hyperglycemia: Secondary | ICD-10-CM

## 2022-07-06 DIAGNOSIS — I10 Essential (primary) hypertension: Secondary | ICD-10-CM

## 2022-07-06 DIAGNOSIS — F17200 Nicotine dependence, unspecified, uncomplicated: Secondary | ICD-10-CM

## 2022-07-06 DIAGNOSIS — K219 Gastro-esophageal reflux disease without esophagitis: Secondary | ICD-10-CM

## 2022-07-06 NOTE — Progress Notes (Signed)
BP 126/66   Pulse 83   Temp (!) 96.7 F (35.9 C)   Wt 197 lb (89.4 kg)   SpO2 97%   BMI 27.48 kg/m    Subjective:    Patient ID: Robert Lyons, male    DOB: [birth year], 61 y.o.   MRN: [id number]  HPI: Robert Lyons is a 61 y.o. male presenting on 07/06/2022 for Diabetes, Hyperlipidemia, and Hypertension   HPI  Chief Complaint  Patient presents with   Diabetes   Hyperlipidemia   Hypertension     Pt Didn't get labs drawn He Still smokes.  He says he does not drink etoh He says he feels well and has no complaints    Relevant past medical, surgical, family and social history reviewed and updated as indicated. Interim medical history since our last visit reviewed. Allergies and medications reviewed and updated.   Current Outpatient Medications:    lisinopril (ZESTRIL) 40 MG tablet, Take 1 tablet (40 mg total) by mouth daily., Disp: 90 tablet, Rfl: 0   metFORMIN (GLUCOPHAGE) 500 MG tablet, Take 1 tablet (500 mg total) by mouth 2 (two) times daily with a meal., Disp: 180 tablet, Rfl: 0   metoprolol tartrate (LOPRESSOR) 50 MG tablet, Take 1 tablet (50 mg total) by mouth 2 (two) times daily., Disp: 180 tablet, Rfl: 0   omeprazole (PRILOSEC) 40 MG capsule, Take 1 capsule (40 mg total) by mouth daily., Disp: 90 capsule, Rfl: 0   sitaGLIPtin (JANUVIA) 100 MG tablet, Take 1 tablet (100 mg total) by mouth daily., Disp: 90 tablet, Rfl: 0   cyclobenzaprine (FLEXERIL) 10 MG tablet, Take 1 tablet (10 mg total) by mouth 2 (two) times daily as needed for muscle spasms. (Patient not taking: Reported on 07/06/2022), Disp: 20 tablet, Rfl: 0   nitroGLYCERIN (NITROSTAT) 0.4 MG SL tablet, Place 1 tablet (0.4 mg total) under the tongue every 5 (five) minutes as needed for chest pain. (Patient not taking: Reported on 07/06/2022), Disp: 25 tablet, Rfl: PRN    Review of Systems  Per HPI unless specifically indicated above     Objective:    BP 126/66   Pulse 83   Temp (!) 96.7 F  (35.9 C)   Wt 197 lb (89.4 kg)   SpO2 97%   BMI 27.48 kg/m   Wt Readings from Last 3 Encounters:  07/06/22 197 lb (89.4 kg)  03/23/22 202 lb (91.6 kg)  02/23/22 195 lb (88.5 kg)    Physical Exam Vitals reviewed.  Constitutional:      General: He is not in acute distress.    Appearance: He is well-developed. He is not ill-appearing.  HENT:     Head: Normocephalic and atraumatic.  Cardiovascular:     Rate and Rhythm: Normal rate and regular rhythm.  Pulmonary:     Effort: Pulmonary effort is normal.     Breath sounds: Normal breath sounds. No wheezing.  Abdominal:     General: Bowel sounds are normal.     Palpations: Abdomen is soft.     Tenderness: There is no abdominal tenderness.  Musculoskeletal:     Cervical back: Neck supple.     Right lower leg: No edema.     Left lower leg: No edema.  Lymphadenopathy:     Cervical: No cervical adenopathy.  Skin:    General: Skin is warm and dry.  Neurological:     Mental Status: He is alert and oriented to person, place, and time.  Psychiatric:  Behavior: Behavior normal.         Assessment & Plan:   Encounter Diagnoses  Name Primary?   Uncontrolled type 2 diabetes mellitus with hyperglycemia (HCC) Yes   Essential hypertension    Hyperlipidemia, unspecified hyperlipidemia type    Tobacco use disorder    Gastroesophageal reflux disease, unspecified whether esophagitis present      Htn- Well controlled  Dm- Pt to get fasting labs drawn.  He will be called with results.  Discussed availability of eye exams and he agrees to go to [place] if needed.    -pt will be scheduled to RTO 3 months.  He will be re-scheduled if needed after review of his labs that he is going to get done this week.  Pt is reminded that he needs to update enrollment through [facility]

## 2022-09-06 ENCOUNTER — Encounter: Payer: Self-pay | Admitting: Physician Assistant

## 2022-10-05 ENCOUNTER — Encounter: Payer: Self-pay | Admitting: Physician Assistant

## 2022-10-05 ENCOUNTER — Other Ambulatory Visit (HOSPITAL_COMMUNITY)
Admission: RE | Admit: 2022-10-05 | Discharge: 2022-10-05 | Disposition: A | Discharge status: Home / Self Care | Payer: Self-pay | Source: Ambulatory Visit | Attending: Physician Assistant | Admitting: Physician Assistant

## 2022-10-05 ENCOUNTER — Ambulatory Visit: Payer: Self-pay | Admitting: Physician Assistant

## 2022-10-05 VITALS — BP 146/78 | HR 86 | Temp 97.2°F | Wt 196.0 lb

## 2022-10-05 DIAGNOSIS — K029 Dental caries, unspecified: Secondary | ICD-10-CM

## 2022-10-05 DIAGNOSIS — I10 Essential (primary) hypertension: Secondary | ICD-10-CM | POA: Insufficient documentation

## 2022-10-05 DIAGNOSIS — Z125 Encounter for screening for malignant neoplasm of prostate: Secondary | ICD-10-CM | POA: Insufficient documentation

## 2022-10-05 DIAGNOSIS — E1165 Type 2 diabetes mellitus with hyperglycemia: Secondary | ICD-10-CM

## 2022-10-05 DIAGNOSIS — F17200 Nicotine dependence, unspecified, uncomplicated: Secondary | ICD-10-CM

## 2022-10-05 DIAGNOSIS — K219 Gastro-esophageal reflux disease without esophagitis: Secondary | ICD-10-CM

## 2022-10-05 DIAGNOSIS — E785 Hyperlipidemia, unspecified: Secondary | ICD-10-CM | POA: Insufficient documentation

## 2022-10-05 DIAGNOSIS — H269 Unspecified cataract: Secondary | ICD-10-CM

## 2022-10-05 LAB — HEMOGLOBIN A1C
Hgb A1c MFr Bld: 7.9 % — ABNORMAL HIGH (ref 4.8–5.6)
Mean Plasma Glucose: 180.03 mg/dL

## 2022-10-05 LAB — COMPREHENSIVE METABOLIC PANEL
ALT: 17 U/L (ref 0–44)
AST: 25 U/L (ref 15–41)
Albumin: 3.9 g/dL (ref 3.5–5.0)
Alkaline Phosphatase: 68 U/L (ref 38–126)
Anion gap: 9 (ref 5–15)
BUN: 10 mg/dL (ref 8–23)
CO2: 24 mmol/L (ref 22–32)
Calcium: 9.6 mg/dL (ref 8.9–10.3)
Chloride: 104 mmol/L (ref 98–111)
Creatinine, Ser: 1.08 mg/dL (ref 0.61–1.24)
GFR, Estimated: >60 mL/min (ref >60)
Glucose, Bld: 281 mg/dL — ABNORMAL HIGH (ref 70–99)
Potassium: 3.4 mmol/L — ABNORMAL LOW (ref 3.5–5.1)
Sodium: 137 mmol/L (ref 135–145)
Total Bilirubin: 0.6 mg/dL (ref 0.3–1.2)
Total Protein: 7.2 g/dL (ref 6.5–8.1)

## 2022-10-05 LAB — LIPID PANEL
Cholesterol: 185 mg/dL (ref 0–200)
HDL: 42 mg/dL (ref >40)
LDL Cholesterol: 96 mg/dL (ref 0–99)
Total CHOL/HDL Ratio: 4.4 RATIO
Triglycerides: 236 mg/dL — ABNORMAL HIGH (ref <150)
VLDL: 47 mg/dL — ABNORMAL HIGH (ref 0–40)

## 2022-10-05 LAB — PSA: Prostatic Specific Antigen: 0.42 ng/mL (ref 0.00–4.00)

## 2022-10-05 MED ORDER — ATORVASTATIN CALCIUM 20 MG PO TABS: 20.0000 mg | ORAL_TABLET | Freq: Every day | ORAL | 0 refills | Status: DC

## 2022-10-05 MED ORDER — AMOXICILLIN 500 MG PO CAPS: 500.0000 mg | ORAL_CAPSULE | Freq: Three times a day (TID) | ORAL | 0 refills | Status: AC

## 2022-10-05 MED ORDER — LISINOPRIL 40 MG PO TABS: 40.0000 mg | ORAL_TABLET | Freq: Every day | ORAL | 0 refills | Status: DC

## 2022-10-05 MED ORDER — METOPROLOL TARTRATE 50 MG PO TABS: 50.0000 mg | ORAL_TABLET | Freq: Two times a day (BID) | ORAL | 0 refills | Status: DC

## 2022-10-05 MED ORDER — OMEPRAZOLE 40 MG PO CPDR: 40.0000 mg | DELAYED_RELEASE_CAPSULE | Freq: Every day | ORAL | 0 refills | Status: DC

## 2022-10-05 MED ORDER — SITAGLIPTIN PHOSPHATE 100 MG PO TABS: 100.0000 mg | ORAL_TABLET | Freq: Every day | ORAL | 0 refills | Status: DC

## 2022-10-05 MED ORDER — METFORMIN HCL 500 MG PO TABS: 500.0000 mg | ORAL_TABLET | Freq: Two times a day (BID) | ORAL | 0 refills | Status: DC

## 2022-10-05 NOTE — Progress Notes (Signed)
BP (!) 146/78   Pulse 86   Temp (!) 97.2 F (36.2 C)   Wt 196 lb (88.9 kg)   SpO2 95%   BMI 27.34 kg/m    Subjective:    Patient ID: Robert Lyons, male    DOB: 11-16-1961, 61 y.o.   MRN: [id number]  HPI: Robert Lyons is a 61 y.o. male presenting on 10/05/2022 for Diabetes, Hypertension, and Hyperlipidemia   HPI   Chief Complaint  Patient presents with   Diabetes   Hypertension   Hyperlipidemia    Pt says he is Not doing well today.  He says his Eyes are getting worse, his Legs are  hurting, his tooth is hurting and he just had argument with his significant other just before appointment.  He thinks that is why his bp is up today.   Relevant past medical, surgical, family and social history reviewed and updated as indicated. Interim medical history since our last visit reviewed. Allergies and medications reviewed and updated.    Current Outpatient Medications:    lisinopril (ZESTRIL) 40 MG tablet, Take 1 tablet (40 mg total) by mouth daily., Disp: 90 tablet, Rfl: 0   metFORMIN (GLUCOPHAGE) 500 MG tablet, Take 1 tablet (500 mg total) by mouth 2 (two) times daily with a meal., Disp: 180 tablet, Rfl: 0   metoprolol tartrate (LOPRESSOR) 50 MG tablet, Take 1 tablet (50 mg total) by mouth 2 (two) times daily., Disp: 180 tablet, Rfl: 0   omeprazole (PRILOSEC) 40 MG capsule, Take 1 capsule (40 mg total) by mouth daily., Disp: 90 capsule, Rfl: 0   sitaGLIPtin (JANUVIA) 100 MG tablet, Take 1 tablet (100 mg total) by mouth daily., Disp: 90 tablet, Rfl: 0   cyclobenzaprine (FLEXERIL) 10 MG tablet, Take 1 tablet (10 mg total) by mouth 2 (two) times daily as needed for muscle spasms. (Patient not taking: Reported on 07/06/2022), Disp: 20 tablet, Rfl: 0   nitroGLYCERIN (NITROSTAT) 0.4 MG SL tablet, Place 1 tablet (0.4 mg total) under the tongue every 5 (five) minutes as needed for chest pain. (Patient not taking: Reported on 07/06/2022), Disp: 25 tablet, Rfl: PRN    Review of  Systems  Per HPI unless specifically indicated above     Objective:    BP (!) 146/78   Pulse 86   Temp (!) 97.2 F (36.2 C)   Wt 196 lb (88.9 kg)   SpO2 95%   BMI 27.34 kg/m   Wt Readings from Last 3 Encounters:  10/05/22 196 lb (88.9 kg)  07/06/22 197 lb (89.4 kg)  03/23/22 202 lb (91.6 kg)    Physical Exam Vitals reviewed.  Constitutional:      General: He is not in acute distress.    Appearance: He is well-developed. He is not toxic-appearing.     Comments: Smells very strongly of gasoline (rode lawnmower to appt today)  HENT:     Head: Normocephalic and atraumatic.     Mouth/Throat:     Dentition: Abnormal dentition. Dental tenderness and dental caries present.     Comments: Tooth decayed at root as pictured Cardiovascular:     Rate and Rhythm: Normal rate and regular rhythm.  Pulmonary:     Effort: Pulmonary effort is normal.     Breath sounds: Normal breath sounds. No wheezing.  Abdominal:     General: Bowel sounds are normal.     Palpations: Abdomen is soft.     Tenderness: There is no abdominal tenderness.  Musculoskeletal:  Cervical back: Neck supple.     Right lower leg: No edema.     Left lower leg: No edema.  Lymphadenopathy:     Cervical: No cervical adenopathy.  Skin:    General: Skin is warm and dry.  Neurological:     Mental Status: He is alert and oriented to person, place, and time.  Psychiatric:        Behavior: Behavior normal.        Results for orders placed or performed during the hospital encounter of 10/05/22  Comprehensive metabolic panel  Result Value Ref Range   Sodium 137 135 - 145 mmol/L   Potassium 3.4 (L) 3.5 - 5.1 mmol/L   Chloride 104 98 - 111 mmol/L   CO2 24 22 - 32 mmol/L   Glucose, Bld 281 (H) 70 - 99 mg/dL   BUN 10 8 - 23 mg/dL   Creatinine, Ser 2.84 0.61 - 1.24 mg/dL   Calcium 9.6 8.9 - 13.2 mg/dL   Total Protein 7.2 6.5 - 8.1 g/dL   Albumin 3.9 3.5 - 5.0 g/dL   AST 25 15 - 41 U/L   ALT 17 0 - 44 U/L    Alkaline Phosphatase 68 38 - 126 U/L   Total Bilirubin 0.6 0.3 - 1.2 mg/dL   GFR, Estimated >44 >01 mL/min   Anion gap 9 5 - 15  Lipid panel  Result Value Ref Range   Cholesterol 185 0 - 200 mg/dL   Triglycerides 027 (H) <150 mg/dL   HDL 42 >25 mg/dL   Total CHOL/HDL Ratio 4.4 RATIO   VLDL 47 (H) 0 - 40 mg/dL   LDL Cholesterol 96 0 - 99 mg/dL           Assessment & Plan:    Encounter Diagnoses  Name Primary?   Uncontrolled type 2 diabetes mellitus with hyperglycemia (HCC) Yes   Essential hypertension    Hyperlipidemia, unspecified hyperlipidemia type    Tobacco use disorder    Gastroesophageal reflux disease, unspecified whether esophagitis present    Cataract of both eyes, unspecified cataract type    Tooth decayed      -reviewed labs with pt.  A1c and psa still pending.  Will call pt when results available -add atorvastatin -continue other meds.  refills sent to [facility] -send to dentist. Rx amoxil -send eye doctor about cataracts (to  [facility]).  Discussed transportation and pt says he will be able to get there -pt cautioned to not burn himself when he smokes in light of his smelling of gasoline -pt to follow up 3 months.  He is to contact office sooner prn

## 2022-10-06 LAB — MICROALBUMIN, URINE: Microalb, Ur: 7 ug/mL — ABNORMAL HIGH

## 2022-10-12 ENCOUNTER — Other Ambulatory Visit: Payer: Self-pay | Admitting: Physician Assistant

## 2022-10-12 MED ORDER — METFORMIN HCL 850 MG PO TABS: 850.0000 mg | ORAL_TABLET | Freq: Two times a day (BID) | ORAL | 1 refills | Status: DC

## 2022-10-13 ENCOUNTER — Telehealth: Payer: Self-pay

## 2022-11-25 DIAGNOSIS — Z419 Encounter for procedure for purposes other than remedying health state, unspecified: Secondary | ICD-10-CM | POA: Diagnosis not present

## 2022-12-02 ENCOUNTER — Encounter: Payer: Self-pay | Admitting: Internal Medicine

## 2022-12-02 ENCOUNTER — Ambulatory Visit (INDEPENDENT_AMBULATORY_CARE_PROVIDER_SITE_OTHER): Payer: Medicaid Other | Admitting: Internal Medicine

## 2022-12-02 VITALS — BP 157/78 | HR 90 | Ht 71.0 in | Wt 196.6 lb

## 2022-12-02 DIAGNOSIS — Z0001 Encounter for general adult medical examination with abnormal findings: Secondary | ICD-10-CM | POA: Diagnosis not present

## 2022-12-02 DIAGNOSIS — I10 Essential (primary) hypertension: Secondary | ICD-10-CM

## 2022-12-02 DIAGNOSIS — E785 Hyperlipidemia, unspecified: Secondary | ICD-10-CM

## 2022-12-02 DIAGNOSIS — Z114 Encounter for screening for human immunodeficiency virus [HIV]: Secondary | ICD-10-CM

## 2022-12-02 DIAGNOSIS — K219 Gastro-esophageal reflux disease without esophagitis: Secondary | ICD-10-CM

## 2022-12-02 DIAGNOSIS — E119 Type 2 diabetes mellitus without complications: Secondary | ICD-10-CM

## 2022-12-02 DIAGNOSIS — F17210 Nicotine dependence, cigarettes, uncomplicated: Secondary | ICD-10-CM

## 2022-12-02 DIAGNOSIS — E1136 Type 2 diabetes mellitus with diabetic cataract: Secondary | ICD-10-CM | POA: Diagnosis not present

## 2022-12-02 DIAGNOSIS — H269 Unspecified cataract: Secondary | ICD-10-CM

## 2022-12-02 DIAGNOSIS — Z1159 Encounter for screening for other viral diseases: Secondary | ICD-10-CM

## 2022-12-02 MED ORDER — PANTOPRAZOLE SODIUM 40 MG PO TBEC: 40.0000 mg | DELAYED_RELEASE_TABLET | Freq: Every day | ORAL | 2 refills | Status: DC

## 2022-12-02 NOTE — Progress Notes (Unsigned)
New Patient Office Visit  Subjective    Patient ID: Robert Lyons, male    DOB: 12-26-1961  Age: 61 y.o. MRN: [id number]  CC:  Chief Complaint  Patient presents with   Establish Care   HPI Robert Lyons presents to establish care.  He is a 61 year old male with a past medical history significant for T2DM, GERD, HTN, HLD, and current tobacco use.  He was previously followed at the free clinic of [facility].  Mr. Homeier reports feeling well today.  Aside from wishing to establish care, he requests a referral to ophthalmology for treatment of cataracts.  Acute concerns, chronic medical conditions, and outstanding preventative care items discussed today are individually addressed A/P below.  Outpatient Encounter Medications as of 12/02/2022  Medication Sig   atorvastatin (LIPITOR) 20 MG tablet Take 1 tablet (20 mg total) by mouth daily.   lisinopril (ZESTRIL) 40 MG tablet Take 1 tablet (40 mg total) by mouth daily.   metFORMIN (GLUCOPHAGE) 850 MG tablet Take 1 tablet (850 mg total) by mouth 2 (two) times daily with a meal.   metoprolol tartrate (LOPRESSOR) 50 MG tablet Take 1 tablet (50 mg total) by mouth 2 (two) times daily.   pantoprazole (PROTONIX) 40 MG tablet Take 1 tablet (40 mg total) by mouth daily.   sitaGLIPtin (JANUVIA) 100 MG tablet Take 1 tablet (100 mg total) by mouth daily.   [DISCONTINUED] omeprazole (PRILOSEC) 40 MG capsule Take 1 capsule (40 mg total) by mouth daily.   [DISCONTINUED] cyclobenzaprine (FLEXERIL) 10 MG tablet Take 1 tablet (10 mg total) by mouth 2 (two) times daily as needed for muscle spasms. (Patient not taking: Reported on 07/06/2022)   [DISCONTINUED] nitroGLYCERIN (NITROSTAT) 0.4 MG SL tablet Place 1 tablet (0.4 mg total) under the tongue every 5 (five) minutes as needed for chest pain. (Patient not taking: Reported on 07/06/2022)   No facility-administered encounter medications on file as of 12/02/2022.    Past Medical History:  Diagnosis  Date   GERD (gastroesophageal reflux disease)    Hypertension    Pneumonia    Type 2 diabetes mellitus ([place])     Past Surgical History:  Procedure Laterality Date   No prior surgery      Family History  Problem Relation Age of Onset   Diabetes Mother    Hypertension Mother    Diabetes Maternal Aunt    Diabetes Maternal Uncle     Social History   Socioeconomic History   Marital status: Single    Spouse name: Not on file   Number of children: Not on file   Years of education: Not on file   Highest education level: Not on file  Occupational History   Not on file  Tobacco Use   Smoking status: Every Day    Packs/day: 1.00    Years: 15.00    Total pack years: 15.00    Types: Cigarettes   Smokeless tobacco: Never  Vaping Use   Vaping Use: Never used  Substance and Sexual Activity   Alcohol use: No    Comment: none since 2014   Drug use: No   Sexual activity: Not on file  Other Topics Concern   Not on file  Social History Narrative   Not on file   Social Determinants of Health   Financial Resource Strain: Not on file  Food Insecurity: Not on file  Transportation Needs: Not on file  Physical Activity: Not on file  Stress: Not on file  Social Connections: Not on file  Intimate Partner Violence: Not on file   Review of Systems  Constitutional:  Negative for chills and fever.  HENT:  Negative for sore throat.   Respiratory:  Negative for cough and shortness of breath.   Cardiovascular:  Negative for chest pain, palpitations and leg swelling.  Gastrointestinal:  Negative for abdominal pain, blood in stool, constipation, diarrhea, nausea and vomiting.  Genitourinary:  Negative for dysuria and hematuria.  Musculoskeletal:  Negative for myalgias.  Skin:  Negative for itching and rash.  Neurological:  Negative for dizziness and headaches.  Psychiatric/Behavioral:  Negative for depression and suicidal ideas.    Objective    BP (!) 157/78   Pulse 90   Ht 5'  11" (1.803 m)   Wt 196 lb 9.6 oz (89.2 kg)   SpO2 96%   BMI 27.42 kg/m   Physical Exam Vitals reviewed.  Constitutional:      General: He is not in acute distress.    Appearance: Normal appearance. He is not ill-appearing.  HENT:     Head: Normocephalic and atraumatic.     Right Ear: External ear normal.     Left Ear: External ear normal.     Nose: Nose normal. No congestion or rhinorrhea.     Mouth/Throat:     Mouth: Mucous membranes are moist.     Pharynx: Oropharynx is clear.  Eyes:     Extraocular Movements: Extraocular movements intact.     Conjunctiva/sclera: Conjunctivae normal.     Pupils: Pupils are equal, round, and reactive to light.  Cardiovascular:     Rate and Rhythm: Normal rate and regular rhythm.     Pulses: Normal pulses.     Heart sounds: Normal heart sounds. No murmur heard. Pulmonary:     Effort: Pulmonary effort is normal.     Breath sounds: Normal breath sounds. No wheezing, rhonchi or rales.  Abdominal:     General: Abdomen is flat. Bowel sounds are normal. There is no distension.     Palpations: Abdomen is soft.     Tenderness: There is no abdominal tenderness.  Musculoskeletal:        General: No swelling or deformity. Normal range of motion.     Cervical back: Normal range of motion.  Skin:    General: Skin is warm and dry.     Capillary Refill: Capillary refill takes less than 2 seconds.  Neurological:     General: No focal deficit present.     Mental Status: He is alert and oriented to person, place, and time.     Motor: No weakness.  Psychiatric:        Mood and Affect: Mood normal.        Behavior: Behavior normal.        Thought Content: Thought content normal.    Last CBC Lab Results  Component Value Date   WBC 8.0 12/02/2022   HGB 13.9 12/02/2022   HCT 41.5 12/02/2022   MCV 92 12/02/2022   MCH 30.7 12/02/2022   RDW 12.1 12/02/2022   PLT 234 [id number]   Last metabolic panel Lab Results  Component Value Date   GLUCOSE  300 (H) 12/02/2022   NA 138 12/02/2022   K 4.1 12/02/2022   CL 99 12/02/2022   CO2 24 12/02/2022   BUN 12 12/02/2022   CREATININE 1.00 12/02/2022   GFRNONAA >60 10/05/2022   CALCIUM 10.2 12/02/2022   PROT 7.3 12/02/2022   ALBUMIN 4.8 12/02/2022  LABGLOB 2.5 12/02/2022   AGRATIO 1.9 12/02/2022   BILITOT 0.6 12/02/2022   ALKPHOS 88 12/02/2022   AST 18 12/02/2022   ALT 18 12/02/2022   ANIONGAP 9 10/05/2022   Last lipids Lab Results  Component Value Date   CHOL 189 12/02/2022   HDL 47 12/02/2022   LDLCALC 99 12/02/2022   TRIG 255 (H) 12/02/2022   CHOLHDL 4.0 12/02/2022   Last hemoglobin A1c Lab Results  Component Value Date   HGBA1C 8.7 (H) 12/02/2022   Assessment & Plan:   Problem List Items Addressed This Visit       Essential hypertension    He is currently prescribed lisinopril 40 mg daily and metoprolol tartrate 50 mg twice daily for treatment of hypertension.  His blood pressure today is 157/78. -No medication changes today.  We will plan for follow-up in 4 weeks for HTN.      GERD (gastroesophageal reflux disease)    Currently prescribed omeprazole 40 mg daily for treatment of GERD, however he states that this does not completely relieve his symptoms. -Start Protonix 40 mg daily and discontinue omeprazole.  Reassess symptoms at follow-up in 4 weeks.      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HCC)    His most recent A1c was 7.9 in October.  He is currently prescribed metformin 850 mg twice daily as well as Januvia 100 mg daily.  He does not regularly check his blood sugar. -No medication changes today -A1c and urine microalbumin/creatinine ratio ordered      Hyperlipidemia    Currently prescribed atorvastatin 20 mg daily for treatment of hyperlipidemia.  Repeat lipid panel ordered today.      Cigarette nicotine dependence without complication    Currently smokes 1 pack of cigarettes per day and is precontemplative when asked about  cessation. -The patient was counseled on the dangers of tobacco use, and was advised to quit.  Reviewed strategies to maximize success, including removing cigarettes and smoking materials from environment, stress management, substitution of other forms of reinforcement, support of family/friends, and written materials.       Cataracts, both eyes - Primary    He endorses bilateral cataracts today.  Likely a complication of T2DM.  He has previously been referred to ophthalmology for treatment, but has not been able to follow-up.  He requests a new ophthalmology referral today.      Encounter for general adult medical examination with abnormal findings    Presenting today to establish care.  Previous records and labs been reviewed. -Repeat labs ordered today -We will plan to address outstanding preventative care items at follow-up in 4 weeks      Return in about 4 weeks (around 12/30/2022).   Billie Lade, MD

## 2022-12-02 NOTE — Patient Instructions (Signed)
It was a pleasure to see you today.  Thank you for giving [place] the opportunity to be involved in your care.  Below is a brief recap of your visit and next steps.  We will plan to see you again in 4 weeks.  Summary You have established care today We will check labs Start protonix and discontinue omeprazole for treatment of reflux.

## 2022-12-03 LAB — HIV ANTIBODY (ROUTINE TESTING W REFLEX): HIV Screen 4th Generation wRfx: NONREACTIVE

## 2022-12-03 LAB — HCV AB W REFLEX TO QUANT PCR: HCV Ab: NONREACTIVE

## 2022-12-03 LAB — HCV INTERPRETATION

## 2022-12-04 LAB — CMP14+EGFR
ALT: 18 IU/L (ref 0–44)
AST: 18 IU/L (ref 0–40)
Albumin/Globulin Ratio: 1.9 (ref 1.2–2.2)
Albumin: 4.8 g/dL (ref 3.9–4.9)
Alkaline Phosphatase: 88 IU/L (ref 44–121)
BUN/Creatinine Ratio: 12 (ref 10–24)
BUN: 12 mg/dL (ref 8–27)
Bilirubin Total: 0.6 mg/dL (ref 0.0–1.2)
CO2: 24 mmol/L (ref 20–29)
Calcium: 10.2 mg/dL (ref 8.6–10.2)
Chloride: 99 mmol/L (ref 96–106)
Creatinine, Ser: 1 mg/dL (ref 0.76–1.27)
Globulin, Total: 2.5 g/dL (ref 1.5–4.5)
Glucose: 300 mg/dL — ABNORMAL HIGH (ref 70–99)
Potassium: 4.1 mmol/L (ref 3.5–5.2)
Sodium: 138 mmol/L (ref 134–144)
Total Protein: 7.3 g/dL (ref 6.0–8.5)
eGFR: 85 mL/min/{1.73_m2} (ref >59)

## 2022-12-04 LAB — CBC WITH DIFFERENTIAL/PLATELET
Basophils Absolute: 0.1 10*3/uL (ref 0.0–0.2)
Basos: 1 %
EOS (ABSOLUTE): 0.2 10*3/uL (ref 0.0–0.4)
Eos: 2 %
Hematocrit: 41.5 % (ref 37.5–51.0)
Hemoglobin: 13.9 g/dL (ref 13.0–17.7)
Immature Grans (Abs): 0 10*3/uL (ref 0.0–0.1)
Immature Granulocytes: 1 %
Lymphocytes Absolute: 2.2 10*3/uL (ref 0.7–3.1)
Lymphs: 28 %
MCH: 30.7 pg (ref 26.6–33.0)
MCHC: 33.5 g/dL (ref 31.5–35.7)
MCV: 92 fL (ref 79–97)
Monocytes Absolute: 0.8 10*3/uL (ref 0.1–0.9)
Monocytes: 10 %
Neutrophils Absolute: 4.7 10*3/uL (ref 1.4–7.0)
Neutrophils: 58 %
Platelets: 234 10*3/uL (ref 150–450)
RBC: 4.53 x10E6/uL (ref 4.14–5.80)
RDW: 12.1 % (ref 11.6–15.4)
WBC: 8 10*3/uL (ref 3.4–10.8)

## 2022-12-04 LAB — TSH+FREE T4
Free T4: 1.18 ng/dL (ref 0.82–1.77)
TSH: 1.89 u[IU]/mL (ref 0.450–4.500)

## 2022-12-04 LAB — HEMOGLOBIN A1C
Est. average glucose Bld gHb Est-mCnc: 203 mg/dL
Hgb A1c MFr Bld: 8.7 % — ABNORMAL HIGH (ref 4.8–5.6)

## 2022-12-04 LAB — LIPID PANEL
Chol/HDL Ratio: 4 ratio (ref 0.0–5.0)
Cholesterol, Total: 189 mg/dL (ref 100–199)
HDL: 47 mg/dL (ref >39)
LDL Chol Calc (NIH): 99 mg/dL (ref 0–99)
Triglycerides: 255 mg/dL — ABNORMAL HIGH (ref 0–149)
VLDL Cholesterol Cal: 43 mg/dL — ABNORMAL HIGH (ref 5–40)

## 2022-12-04 LAB — B12 AND FOLATE PANEL
Folate: 9.7 ng/mL (ref >3.0)
Vitamin B-12: 381 pg/mL (ref 232–1245)

## 2022-12-04 LAB — VITAMIN D 25 HYDROXY (VIT D DEFICIENCY, FRACTURES): Vit D, 25-Hydroxy: 15.4 ng/mL — ABNORMAL LOW (ref 30.0–100.0)

## 2022-12-05 ENCOUNTER — Other Ambulatory Visit: Payer: Self-pay | Admitting: Internal Medicine

## 2022-12-05 DIAGNOSIS — E559 Vitamin D deficiency, unspecified: Secondary | ICD-10-CM

## 2022-12-05 LAB — MICROALBUMIN / CREATININE URINE RATIO
Creatinine, Urine: 85.1 mg/dL
Microalb/Creat Ratio: 6 mg/g creat (ref 0–29)
Microalbumin, Urine: 4.9 ug/mL

## 2022-12-05 MED ORDER — VITAMIN D (ERGOCALCIFEROL) 1.25 MG (50000 UNIT) PO CAPS: 50000.0000 [IU] | ORAL_CAPSULE | ORAL | 0 refills | Status: DC

## 2022-12-08 DIAGNOSIS — Z0001 Encounter for general adult medical examination with abnormal findings: Secondary | ICD-10-CM | POA: Insufficient documentation

## 2022-12-08 DIAGNOSIS — K219 Gastro-esophageal reflux disease without esophagitis: Secondary | ICD-10-CM | POA: Insufficient documentation

## 2022-12-08 DIAGNOSIS — H269 Unspecified cataract: Secondary | ICD-10-CM | POA: Insufficient documentation

## 2022-12-08 NOTE — Assessment & Plan Note (Signed)
Currently prescribed omeprazole 40 mg daily for treatment of GERD, however he states that this does not completely relieve his symptoms. -Start Protonix 40 mg daily and discontinue omeprazole.  Reassess symptoms at follow-up in 4 weeks.

## 2022-12-08 NOTE — Assessment & Plan Note (Signed)
Currently smokes 1 pack of cigarettes per day and is precontemplative when asked about cessation. -The patient was counseled on the dangers of tobacco use, and was advised to quit.  Reviewed strategies to maximize success, including removing cigarettes and smoking materials from environment, stress management, substitution of other forms of reinforcement, support of family/friends, and written materials.

## 2022-12-08 NOTE — Assessment & Plan Note (Signed)
He is currently prescribed lisinopril 40 mg daily and metoprolol tartrate 50 mg twice daily for treatment of hypertension.  His blood pressure today is 157/78. -No medication changes today.  We will plan for follow-up in 4 weeks for HTN.

## 2022-12-08 NOTE — Assessment & Plan Note (Signed)
He endorses bilateral cataracts today.  Likely a complication of T2DM.  He has previously been referred to ophthalmology for treatment, but has not been able to follow-up.  He requests a new ophthalmology referral today.

## 2022-12-08 NOTE — Assessment & Plan Note (Signed)
Presenting today to establish care.  Previous records and labs been reviewed. -Repeat labs ordered today -We will plan to address outstanding preventative care items at follow-up in 4 weeks

## 2022-12-08 NOTE — Assessment & Plan Note (Signed)
Currently prescribed atorvastatin 20 mg daily for treatment of hyperlipidemia.  Repeat lipid panel ordered today. 

## 2022-12-08 NOTE — Assessment & Plan Note (Signed)
His most recent A1c was 7.9 in October.  He is currently prescribed metformin 850 mg twice daily as well as Januvia 100 mg daily.  He does not regularly check his blood sugar. -No medication changes today -A1c and urine microalbumin/creatinine ratio ordered

## 2022-12-20 ENCOUNTER — Telehealth: Payer: Self-pay | Admitting: Internal Medicine

## 2022-12-20 DIAGNOSIS — E1136 Type 2 diabetes mellitus with diabetic cataract: Secondary | ICD-10-CM

## 2022-12-20 NOTE — Telephone Encounter (Signed)
Pt called stating he forgot to mention that he is needing a glucose monitor & testing strips. He wants ONETOUCH ULTRA.

## 2022-12-21 ENCOUNTER — Telehealth: Payer: Self-pay | Admitting: Internal Medicine

## 2022-12-21 ENCOUNTER — Other Ambulatory Visit: Payer: Self-pay

## 2022-12-21 ENCOUNTER — Encounter: Payer: Self-pay | Admitting: Internal Medicine

## 2022-12-21 DIAGNOSIS — E1136 Type 2 diabetes mellitus with diabetic cataract: Secondary | ICD-10-CM

## 2022-12-21 MED ORDER — ONETOUCH ULTRALINK W/DEVICE KIT: 1.0000 | PACK | 0 refills | Status: DC | PRN

## 2022-12-21 MED ORDER — ONETOUCH ULTRASOFT LANCETS MISC: 12 refills | Status: DC

## 2022-12-21 MED ORDER — ONETOUCH ULTRA VI STRP: ORAL_STRIP | 12 refills | Status: DC

## 2022-12-21 NOTE — Telephone Encounter (Signed)
Sent in

## 2022-12-21 NOTE — Telephone Encounter (Signed)
Patient needs meds sent in to [facility]    Lancets [facility] ULTRASOFT) lancets  glucose blood (ONETOUCH ULTRA) test strip    Blood Glucose Monitoring Suppl (ONETOUCH ULTRALINK) w/Device KIT

## 2022-12-21 NOTE — Telephone Encounter (Signed)
Orders placed.

## 2022-12-22 DIAGNOSIS — H01004 Unspecified blepharitis left upper eyelid: Secondary | ICD-10-CM | POA: Diagnosis not present

## 2022-12-22 DIAGNOSIS — Z7689 Persons encountering health services in other specified circumstances: Secondary | ICD-10-CM | POA: Diagnosis not present

## 2022-12-22 DIAGNOSIS — H11153 Pinguecula, bilateral: Secondary | ICD-10-CM | POA: Diagnosis not present

## 2022-12-22 DIAGNOSIS — H2513 Age-related nuclear cataract, bilateral: Secondary | ICD-10-CM | POA: Diagnosis not present

## 2022-12-22 DIAGNOSIS — H01002 Unspecified blepharitis right lower eyelid: Secondary | ICD-10-CM | POA: Diagnosis not present

## 2022-12-22 DIAGNOSIS — H01001 Unspecified blepharitis right upper eyelid: Secondary | ICD-10-CM | POA: Diagnosis not present

## 2022-12-22 DIAGNOSIS — H01005 Unspecified blepharitis left lower eyelid: Secondary | ICD-10-CM | POA: Diagnosis not present

## 2022-12-26 DIAGNOSIS — Z419 Encounter for procedure for purposes other than remedying health state, unspecified: Secondary | ICD-10-CM | POA: Diagnosis not present

## 2022-12-29 ENCOUNTER — Telehealth: Payer: Self-pay | Admitting: Internal Medicine

## 2022-12-29 ENCOUNTER — Other Ambulatory Visit: Payer: Self-pay

## 2022-12-29 DIAGNOSIS — E1136 Type 2 diabetes mellitus with diabetic cataract: Secondary | ICD-10-CM

## 2022-12-29 MED ORDER — ONETOUCH ULTRASOFT LANCETS MISC: 12 refills | Status: DC

## 2022-12-29 MED ORDER — ONETOUCH ULTRALINK W/DEVICE KIT: 1.0000 | PACK | 0 refills | Status: DC | PRN

## 2022-12-29 MED ORDER — ONETOUCH ULTRA VI STRP: ORAL_STRIP | 12 refills | Status: DC

## 2022-12-29 NOTE — Telephone Encounter (Signed)
Refills sent and pt aware  

## 2022-12-29 NOTE — Telephone Encounter (Signed)
Patient needs Blood Glucose Monitoring Suppl (ONETOUCH ULTRALINK) w/Device KIT   glucose blood (ONETOUCH ULTRA) test strip   Lancets (ONETOUCH ULTRASOFT) lancets   Sent in to [facility] to change pharmacy to [facility] for all new prescription and refills

## 2023-01-02 ENCOUNTER — Ambulatory Visit: Payer: Medicaid Other | Admitting: Internal Medicine

## 2023-01-05 ENCOUNTER — Ambulatory Visit (INDEPENDENT_AMBULATORY_CARE_PROVIDER_SITE_OTHER): Payer: Medicaid Other | Admitting: Internal Medicine

## 2023-01-05 ENCOUNTER — Encounter: Payer: Self-pay | Admitting: Internal Medicine

## 2023-01-05 VITALS — BP 146/82 | HR 79 | Ht 71.0 in | Wt 197.6 lb

## 2023-01-05 DIAGNOSIS — I10 Essential (primary) hypertension: Secondary | ICD-10-CM

## 2023-01-05 DIAGNOSIS — E559 Vitamin D deficiency, unspecified: Secondary | ICD-10-CM | POA: Diagnosis not present

## 2023-01-05 DIAGNOSIS — E1136 Type 2 diabetes mellitus with diabetic cataract: Secondary | ICD-10-CM

## 2023-01-05 DIAGNOSIS — E785 Hyperlipidemia, unspecified: Secondary | ICD-10-CM

## 2023-01-05 MED ORDER — AMLODIPINE BESYLATE 5 MG PO TABS: 5.0000 mg | ORAL_TABLET | Freq: Every day | ORAL | 2 refills | Status: DC

## 2023-01-05 MED ORDER — VITAMIN D (ERGOCALCIFEROL) 1.25 MG (50000 UNIT) PO CAPS: 50000.0000 [IU] | ORAL_CAPSULE | ORAL | 0 refills | Status: DC

## 2023-01-05 MED ORDER — TIRZEPATIDE 2.5 MG/0.5ML ~~LOC~~ SOAJ: 2.5000 mg | SUBCUTANEOUS | 0 refills | Status: DC

## 2023-01-05 MED ORDER — ATORVASTATIN CALCIUM 40 MG PO TABS: 40.0000 mg | ORAL_TABLET | Freq: Every day | ORAL | 3 refills | Status: DC

## 2023-01-05 NOTE — Patient Instructions (Signed)
It was a pleasure to see you today.  Thank you for giving [place] the opportunity to be involved in your care.  Below is a brief recap of your visit and next steps.  We will plan to see you again in 4 weeks.  Summary Add amlodipine 5 mg daily for blood pressure Increase atorvastatin 40 mg daily for cholesterol Start Mounjaro 2.5 mg weekly injections for diabetes Start vitamin D weekly supplementation for vitamin D deficiency  STOP  Januvia and atorvastatin 20 mg

## 2023-01-05 NOTE — Assessment & Plan Note (Signed)
Presenting today for HTN follow-up.  His blood pressure remains above goal, 150/73.  He is currently prescribed lisinopril 40 mg daily and metoprolol tartrate 50 mg twice daily. -Add amlodipine 5 mg daily -Follow-up in 4 weeks for HTN check

## 2023-01-05 NOTE — Progress Notes (Signed)
Established Patient Office Visit  Subjective   Patient ID: Robert Lyons, male    DOB: 1961-03-07  Age: 62 y.o. MRN: [id number]  Chief Complaint  Patient presents with   Hypertension    Follow up   Robert Lyons returns to care today for HTN follow-up.  He was last seen by me on 12/02/22 to establish care.  His blood pressure was elevated at that time.  4-week follow-up was arranged for HTN check.  He was also started on Protonix for improved treatment of GERD.  A new referral to ophthalmology was also placed due to a history of bilateral cataracts.  Baseline labs were ordered.  There have been no acute interval events. Robert Lyons states that he feels well today. His GERD symptoms have improved and he reports that he will undergo cataract surgery next month.   Past Medical History:  Diagnosis Date   GERD (gastroesophageal reflux disease)    Hypertension    Pneumonia    Type 2 diabetes mellitus ([place])    Past Surgical History:  Procedure Laterality Date   No prior surgery     Social History   Tobacco Use   Smoking status: Every Day    Packs/day: 1.00    Years: 15.00    Total pack years: 15.00    Types: Cigarettes   Smokeless tobacco: Never  Vaping Use   Vaping Use: Never used  Substance Use Topics   Alcohol use: No    Comment: none since 2014   Drug use: No   Family History  Problem Relation Age of Onset   Diabetes Mother    Hypertension Mother    Diabetes Maternal Aunt    Diabetes Maternal Uncle    No Known Allergies  Review of Systems  Constitutional:  Negative for chills and fever.  HENT:  Negative for sore throat.   Respiratory:  Negative for cough and shortness of breath.   Cardiovascular:  Negative for chest pain, palpitations and leg swelling.  Gastrointestinal:  Negative for abdominal pain, blood in stool, constipation, diarrhea, nausea and vomiting.  Genitourinary:  Negative for dysuria and hematuria.  Musculoskeletal:  Negative for myalgias.   Skin:  Negative for itching and rash.  Neurological:  Negative for dizziness and headaches.  Psychiatric/Behavioral:  Negative for depression and suicidal ideas.      Objective:     BP (!) 146/82   Pulse 79   Ht 5\' 11"  (1.803 m)   Wt 197 lb 9.6 oz (89.6 kg)   SpO2 97%   BMI 27.56 kg/m  BP Readings from Last 3 Encounters:  01/05/23 (!) 146/82  12/02/22 (!) 157/78  10/05/22 (!) 146/78   Physical Exam Vitals reviewed.  Constitutional:      General: He is not in acute distress.    Appearance: Normal appearance. He is not ill-appearing.  HENT:     Head: Normocephalic and atraumatic.     Right Ear: External ear normal.     Left Ear: External ear normal.     Nose: Nose normal. No congestion or rhinorrhea.     Mouth/Throat:     Mouth: Mucous membranes are moist.     Pharynx: Oropharynx is clear.  Eyes:     Extraocular Movements: Extraocular movements intact.     Conjunctiva/sclera: Conjunctivae normal.     Pupils: Pupils are equal, round, and reactive to light.  Cardiovascular:     Rate and Rhythm: Normal rate and regular rhythm.     Pulses:  Normal pulses.     Heart sounds: Normal heart sounds. No murmur heard. Pulmonary:     Effort: Pulmonary effort is normal.     Breath sounds: Normal breath sounds. No wheezing, rhonchi or rales.  Abdominal:     General: Abdomen is flat. Bowel sounds are normal. There is no distension.     Palpations: Abdomen is soft.     Tenderness: There is no abdominal tenderness.  Musculoskeletal:        General: No swelling or deformity. Normal range of motion.     Cervical back: Normal range of motion.  Skin:    General: Skin is warm and dry.     Capillary Refill: Capillary refill takes less than 2 seconds.  Neurological:     General: No focal deficit present.     Mental Status: He is alert and oriented to person, place, and time.     Motor: No weakness.  Psychiatric:        Mood and Affect: Mood normal.        Behavior: Behavior normal.         Thought Content: Thought content normal.    Last CBC Lab Results  Component Value Date   WBC 8.0 12/02/2022   HGB 13.9 12/02/2022   HCT 41.5 12/02/2022   MCV 92 12/02/2022   MCH 30.7 12/02/2022   RDW 12.1 12/02/2022   PLT 234 12/02/2022   Last metabolic panel Lab Results  Component Value Date   GLUCOSE 300 (H) 12/02/2022   NA 138 12/02/2022   K 4.1 12/02/2022   CL 99 12/02/2022   CO2 24 12/02/2022   BUN 12 12/02/2022   CREATININE 1.00 12/02/2022   EGFR 85 12/02/2022   CALCIUM 10.2 12/02/2022   PROT 7.3 12/02/2022   ALBUMIN 4.8 12/02/2022   LABGLOB 2.5 12/02/2022   AGRATIO 1.9 12/02/2022   BILITOT 0.6 12/02/2022   ALKPHOS 88 12/02/2022   AST 18 12/02/2022   ALT 18 12/02/2022   ANIONGAP 9 10/05/2022   Last lipids Lab Results  Component Value Date   CHOL 189 12/02/2022   HDL 47 12/02/2022   LDLCALC 99 12/02/2022   TRIG 255 (H) 12/02/2022   CHOLHDL 4.0 12/02/2022   Last hemoglobin A1c Lab Results  Component Value Date   HGBA1C 8.7 (H) 12/02/2022   Last thyroid functions Lab Results  Component Value Date   TSH 1.890 12/02/2022   Last vitamin D Lab Results  Component Value Date   VD25OH 15.4 (L) 12/02/2022   Last vitamin B12 and Folate Lab Results  Component Value Date   VITAMINB12 381 12/02/2022   FOLATE 9.7 12/02/2022   The 10-year ASCVD risk score (Arnett DK, et al., 2019) is: 49.9%    Assessment & Plan:   Problem List Items Addressed This Visit       Essential hypertension - Primary    Presenting today for HTN follow-up.  His blood pressure remains above goal, 150/73.  He is currently prescribed lisinopril 40 mg daily and metoprolol tartrate 50 mg twice daily. -Add amlodipine 5 mg daily -Follow-up in 4 weeks for HTN check      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HCC)    A1c increased to 8.7 when updated last month.  He is currently prescribed metformin 850 mg twice daily as well as Januvia 100 mg daily.  He does  not regularly check his blood sugar. -Start Mounjaro 2.5 mg weekly and discontinue Januvia -Follow-up in 4 weeks  Hyperlipidemia    Lipid panel updated last month.  Total cholesterol 189 and LDL 99.  His 10-year ASCVD risk score today is 54.8%. -Increase atorvastatin 40 mg daily -Follow-up in 4 weeks      Vitamin D deficiency    Vitamin D level 15 on labs from last month.  High-dose, weekly vitamin D supplementation x 12 weeks was prescribed at that time, however he has filled the prescription. -New order for vitamin D supplementation placed today       Return in about 4 weeks (around 02/02/2023) for HTN, DM, HLD.    [name], MD

## 2023-01-05 NOTE — Assessment & Plan Note (Signed)
Lipid panel updated last month.  Total cholesterol 189 and LDL 99.  His 10-year ASCVD risk score today is 54.8%. -Increase atorvastatin 40 mg daily -Follow-up in 4 weeks

## 2023-01-05 NOTE — Assessment & Plan Note (Signed)
Vitamin D level 15 on labs from last month.  High-dose, weekly vitamin D supplementation x 12 weeks was prescribed at that time, however he has filled the prescription. -New order for vitamin D supplementation placed today

## 2023-01-05 NOTE — Assessment & Plan Note (Signed)
A1c increased to 8.7 when updated last month.  He is currently prescribed metformin 850 mg twice daily as well as Januvia 100 mg daily.  He does not regularly check his blood sugar. -Start Mounjaro 2.5 mg weekly and discontinue Januvia -Follow-up in 4 weeks

## 2023-01-06 ENCOUNTER — Other Ambulatory Visit: Payer: Self-pay

## 2023-01-06 DIAGNOSIS — E1136 Type 2 diabetes mellitus with diabetic cataract: Secondary | ICD-10-CM

## 2023-01-06 MED ORDER — OZEMPIC (0.25 OR 0.5 MG/DOSE) 2 MG/3ML ~~LOC~~ SOPN: 0.2500 mg | PEN_INJECTOR | SUBCUTANEOUS | 0 refills | Status: DC

## 2023-01-09 ENCOUNTER — Other Ambulatory Visit: Payer: Self-pay

## 2023-01-09 DIAGNOSIS — Z1211 Encounter for screening for malignant neoplasm of colon: Secondary | ICD-10-CM

## 2023-01-16 ENCOUNTER — Telehealth: Payer: Self-pay | Admitting: Internal Medicine

## 2023-01-16 NOTE — Telephone Encounter (Signed)
Pt has questions in regards to a sample kit he was given?

## 2023-01-17 ENCOUNTER — Ambulatory Visit: Payer: Self-pay | Admitting: Physician Assistant

## 2023-01-17 NOTE — Telephone Encounter (Signed)
Mailbox full

## 2023-01-17 NOTE — Telephone Encounter (Signed)
Pt returned call

## 2023-01-18 ENCOUNTER — Emergency Department (HOSPITAL_COMMUNITY): Payer: Medicaid Other

## 2023-01-18 ENCOUNTER — Inpatient Hospital Stay (HOSPITAL_COMMUNITY): Payer: Medicaid Other

## 2023-01-18 ENCOUNTER — Encounter (HOSPITAL_COMMUNITY): Payer: Self-pay | Admitting: *Deleted

## 2023-01-18 ENCOUNTER — Other Ambulatory Visit: Payer: Self-pay

## 2023-01-18 ENCOUNTER — Inpatient Hospital Stay (HOSPITAL_COMMUNITY)
Admission: EM | Admit: 2023-01-18 | Discharge: 2023-01-19 | DRG: 065 | Disposition: A | Discharge status: Home / Self Care | Payer: Medicaid Other | Attending: Family Medicine | Admitting: Family Medicine

## 2023-01-18 DIAGNOSIS — I639 Cerebral infarction, unspecified: Secondary | ICD-10-CM | POA: Diagnosis not present

## 2023-01-18 DIAGNOSIS — K219 Gastro-esophageal reflux disease without esophagitis: Secondary | ICD-10-CM | POA: Diagnosis present

## 2023-01-18 DIAGNOSIS — R000 Tachycardia, unspecified: Secondary | ICD-10-CM | POA: Diagnosis present

## 2023-01-18 DIAGNOSIS — Z720 Tobacco use: Secondary | ICD-10-CM

## 2023-01-18 DIAGNOSIS — R1012 Left upper quadrant pain: Secondary | ICD-10-CM | POA: Diagnosis not present

## 2023-01-18 DIAGNOSIS — Z833 Family history of diabetes mellitus: Secondary | ICD-10-CM | POA: Diagnosis not present

## 2023-01-18 DIAGNOSIS — E785 Hyperlipidemia, unspecified: Secondary | ICD-10-CM | POA: Diagnosis not present

## 2023-01-18 DIAGNOSIS — E876 Hypokalemia: Secondary | ICD-10-CM | POA: Diagnosis not present

## 2023-01-18 DIAGNOSIS — R109 Unspecified abdominal pain: Secondary | ICD-10-CM | POA: Diagnosis present

## 2023-01-18 DIAGNOSIS — E1165 Type 2 diabetes mellitus with hyperglycemia: Secondary | ICD-10-CM | POA: Diagnosis present

## 2023-01-18 DIAGNOSIS — R531 Weakness: Secondary | ICD-10-CM | POA: Diagnosis not present

## 2023-01-18 DIAGNOSIS — F17210 Nicotine dependence, cigarettes, uncomplicated: Secondary | ICD-10-CM | POA: Diagnosis not present

## 2023-01-18 DIAGNOSIS — Z7984 Long term (current) use of oral hypoglycemic drugs: Secondary | ICD-10-CM

## 2023-01-18 DIAGNOSIS — Z8249 Family history of ischemic heart disease and other diseases of the circulatory system: Secondary | ICD-10-CM

## 2023-01-18 DIAGNOSIS — Z79899 Other long term (current) drug therapy: Secondary | ICD-10-CM | POA: Diagnosis not present

## 2023-01-18 DIAGNOSIS — R0789 Other chest pain: Secondary | ICD-10-CM | POA: Diagnosis not present

## 2023-01-18 DIAGNOSIS — R29705 NIHSS score 5: Secondary | ICD-10-CM | POA: Diagnosis present

## 2023-01-18 DIAGNOSIS — Z7985 Long-term (current) use of injectable non-insulin antidiabetic drugs: Secondary | ICD-10-CM | POA: Diagnosis not present

## 2023-01-18 DIAGNOSIS — R079 Chest pain, unspecified: Secondary | ICD-10-CM | POA: Diagnosis present

## 2023-01-18 DIAGNOSIS — Z716 Tobacco abuse counseling: Secondary | ICD-10-CM

## 2023-01-18 DIAGNOSIS — E1139 Type 2 diabetes mellitus with other diabetic ophthalmic complication: Secondary | ICD-10-CM | POA: Diagnosis present

## 2023-01-18 DIAGNOSIS — I6329 Cerebral infarction due to unspecified occlusion or stenosis of other precerebral arteries: Principal | ICD-10-CM | POA: Diagnosis present

## 2023-01-18 DIAGNOSIS — R519 Headache, unspecified: Secondary | ICD-10-CM | POA: Diagnosis not present

## 2023-01-18 DIAGNOSIS — G8194 Hemiplegia, unspecified affecting left nondominant side: Secondary | ICD-10-CM | POA: Diagnosis present

## 2023-01-18 DIAGNOSIS — I613 Nontraumatic intracerebral hemorrhage in brain stem: Secondary | ICD-10-CM | POA: Diagnosis not present

## 2023-01-18 DIAGNOSIS — R29898 Other symptoms and signs involving the musculoskeletal system: Secondary | ICD-10-CM | POA: Diagnosis not present

## 2023-01-18 DIAGNOSIS — G8929 Other chronic pain: Secondary | ICD-10-CM | POA: Diagnosis present

## 2023-01-18 DIAGNOSIS — I6389 Other cerebral infarction: Secondary | ICD-10-CM | POA: Diagnosis not present

## 2023-01-18 DIAGNOSIS — E1136 Type 2 diabetes mellitus with diabetic cataract: Secondary | ICD-10-CM

## 2023-01-18 DIAGNOSIS — R599 Enlarged lymph nodes, unspecified: Secondary | ICD-10-CM | POA: Diagnosis present

## 2023-01-18 DIAGNOSIS — I10 Essential (primary) hypertension: Secondary | ICD-10-CM | POA: Diagnosis present

## 2023-01-18 LAB — BASIC METABOLIC PANEL
Anion gap: 11 (ref 5–15)
BUN: 11 mg/dL (ref 8–23)
CO2: 23 mmol/L (ref 22–32)
Calcium: 9 mg/dL (ref 8.9–10.3)
Chloride: 101 mmol/L (ref 98–111)
Creatinine, Ser: 1.06 mg/dL (ref 0.61–1.24)
GFR, Estimated: >60 mL/min (ref >60)
Glucose, Bld: 389 mg/dL — ABNORMAL HIGH (ref 70–99)
Potassium: 3.2 mmol/L — ABNORMAL LOW (ref 3.5–5.1)
Sodium: 135 mmol/L (ref 135–145)

## 2023-01-18 LAB — DIFFERENTIAL
Abs Immature Granulocytes: 0.02 10*3/uL (ref 0.00–0.07)
Basophils Absolute: 0.1 10*3/uL (ref 0.0–0.1)
Basophils Relative: 1 %
Eosinophils Absolute: 0.1 10*3/uL (ref 0.0–0.5)
Eosinophils Relative: 2 %
Immature Granulocytes: 0 %
Lymphocytes Relative: 27 %
Lymphs Abs: 1.9 10*3/uL (ref 0.7–4.0)
Monocytes Absolute: 0.8 10*3/uL (ref 0.1–1.0)
Monocytes Relative: 11 %
Neutro Abs: 4.2 10*3/uL (ref 1.7–7.7)
Neutrophils Relative %: 59 %

## 2023-01-18 LAB — TROPONIN I (HIGH SENSITIVITY)
Troponin I (High Sensitivity): 3 ng/L (ref <18)
Troponin I (High Sensitivity): 3 ng/L (ref <18)

## 2023-01-18 LAB — URINALYSIS, ROUTINE W REFLEX MICROSCOPIC
Bacteria, UA: NONE SEEN
Bilirubin Urine: NEGATIVE
Glucose, UA: >=500 mg/dL — AB
Hgb urine dipstick: NEGATIVE
Ketones, ur: NEGATIVE mg/dL
Nitrite: NEGATIVE
Protein, ur: NEGATIVE mg/dL
Specific Gravity, Urine: 1.015 (ref 1.005–1.030)
pH: 6 (ref 5.0–8.0)

## 2023-01-18 LAB — RAPID URINE DRUG SCREEN, HOSP PERFORMED
Amphetamines: NOT DETECTED
Barbiturates: NOT DETECTED
Benzodiazepines: NOT DETECTED
Cocaine: NOT DETECTED
Opiates: NOT DETECTED
Tetrahydrocannabinol: NOT DETECTED

## 2023-01-18 LAB — CBC
HCT: 38.1 % — ABNORMAL LOW (ref 39.0–52.0)
Hemoglobin: 12.9 g/dL — ABNORMAL LOW (ref 13.0–17.0)
MCH: 30.3 pg (ref 26.0–34.0)
MCHC: 33.9 g/dL (ref 30.0–36.0)
MCV: 89.4 fL (ref 80.0–100.0)
Platelets: 206 10*3/uL (ref 150–400)
RBC: 4.26 MIL/uL (ref 4.22–5.81)
RDW: 11.9 % (ref 11.5–15.5)
WBC: 7.2 10*3/uL (ref 4.0–10.5)
nRBC: 0 % (ref 0.0–0.2)

## 2023-01-18 LAB — HEPATIC FUNCTION PANEL
ALT: 19 U/L (ref 0–44)
AST: 28 U/L (ref 15–41)
Albumin: 4.1 g/dL (ref 3.5–5.0)
Alkaline Phosphatase: 58 U/L (ref 38–126)
Bilirubin, Direct: 0.2 mg/dL (ref 0.0–0.2)
Indirect Bilirubin: 0.8 mg/dL (ref 0.3–0.9)
Total Bilirubin: 1 mg/dL (ref 0.3–1.2)
Total Protein: 7.1 g/dL (ref 6.5–8.1)

## 2023-01-18 LAB — PROTIME-INR
INR: 1.1 (ref 0.8–1.2)
Prothrombin Time: 14.3 seconds (ref 11.4–15.2)

## 2023-01-18 LAB — ETHANOL: Alcohol, Ethyl (B): <10 mg/dL (ref <10)

## 2023-01-18 LAB — MAGNESIUM: Magnesium: 1.5 mg/dL — ABNORMAL LOW (ref 1.7–2.4)

## 2023-01-18 LAB — GLUCOSE, CAPILLARY: Glucose-Capillary: 383 mg/dL — ABNORMAL HIGH (ref 70–99)

## 2023-01-18 LAB — LIPASE, BLOOD: Lipase: 27 U/L (ref 11–51)

## 2023-01-18 LAB — APTT: aPTT: 26 seconds (ref 24–36)

## 2023-01-18 MED ORDER — ACETAMINOPHEN 160 MG/5ML PO SOLN: 650.0000 mg | ORAL | Status: DC | PRN

## 2023-01-18 MED ORDER — METOPROLOL TARTRATE 50 MG PO TABS
50.0000 mg | ORAL_TABLET | Freq: Two times a day (BID) | ORAL | Status: DC
  Administered 2023-01-19: 50 mg via ORAL
  Filled 2023-01-18: qty 1

## 2023-01-18 MED ORDER — IOHEXOL 350 MG/ML SOLN
100.0000 mL | Freq: Once | INTRAVENOUS | Status: AC | PRN
  Administered 2023-01-18: 100 mL via INTRAVENOUS

## 2023-01-18 MED ORDER — PANTOPRAZOLE SODIUM 40 MG PO TBEC
40.0000 mg | DELAYED_RELEASE_TABLET | Freq: Every day | ORAL | Status: DC
  Administered 2023-01-18 – 2023-01-19 (×2): 40 mg via ORAL
  Filled 2023-01-18 (×2): qty 1

## 2023-01-18 MED ORDER — ASPIRIN 81 MG PO CHEW
81.0000 mg | CHEWABLE_TABLET | Freq: Once | ORAL | Status: AC
  Administered 2023-01-18: 81 mg via ORAL
  Filled 2023-01-18: qty 1

## 2023-01-18 MED ORDER — LABETALOL HCL 5 MG/ML IV SOLN: 10.0000 mg | INTRAVENOUS | Status: DC | PRN

## 2023-01-18 MED ORDER — CLOPIDOGREL BISULFATE 75 MG PO TABS
300.0000 mg | ORAL_TABLET | Freq: Once | ORAL | Status: AC
  Administered 2023-01-18: 300 mg via ORAL
  Filled 2023-01-18: qty 4

## 2023-01-18 MED ORDER — AMLODIPINE BESYLATE 5 MG PO TABS
5.0000 mg | ORAL_TABLET | Freq: Every day | ORAL | Status: DC
  Administered 2023-01-19: 5 mg via ORAL
  Filled 2023-01-18: qty 1

## 2023-01-18 MED ORDER — ENOXAPARIN SODIUM 40 MG/0.4ML IJ SOSY
40.0000 mg | PREFILLED_SYRINGE | Freq: Every day | INTRAMUSCULAR | Status: DC
  Administered 2023-01-18: 40 mg via SUBCUTANEOUS
  Filled 2023-01-18: qty 0.4

## 2023-01-18 MED ORDER — SODIUM CHLORIDE 0.9 % IV BOLUS
500.0000 mL | Freq: Once | INTRAVENOUS | Status: AC
  Administered 2023-01-18: 500 mL via INTRAVENOUS

## 2023-01-18 MED ORDER — CLOPIDOGREL BISULFATE 75 MG PO TABS
75.0000 mg | ORAL_TABLET | Freq: Every day | ORAL | Status: DC
  Administered 2023-01-19: 75 mg via ORAL
  Filled 2023-01-18: qty 1

## 2023-01-18 MED ORDER — ACETAMINOPHEN 325 MG PO TABS: 650.0000 mg | ORAL_TABLET | ORAL | Status: DC | PRN

## 2023-01-18 MED ORDER — METOPROLOL TARTRATE 50 MG PO TABS: 50.0000 mg | ORAL_TABLET | Freq: Two times a day (BID) | ORAL | Status: DC

## 2023-01-18 MED ORDER — INSULIN ASPART 100 UNIT/ML IJ SOLN
0.0000 [IU] | Freq: Three times a day (TID) | INTRAMUSCULAR | Status: DC
  Administered 2023-01-19: 5 [IU] via SUBCUTANEOUS
  Administered 2023-01-19: 8 [IU] via SUBCUTANEOUS
  Administered 2023-01-19: 5 [IU] via SUBCUTANEOUS

## 2023-01-18 MED ORDER — INSULIN ASPART 100 UNIT/ML IJ SOLN
0.0000 [IU] | Freq: Every day | INTRAMUSCULAR | Status: DC
  Administered 2023-01-18: 5 [IU] via SUBCUTANEOUS

## 2023-01-18 MED ORDER — ATORVASTATIN CALCIUM 40 MG PO TABS
40.0000 mg | ORAL_TABLET | Freq: Every day | ORAL | Status: DC
  Administered 2023-01-18 – 2023-01-19 (×2): 40 mg via ORAL
  Filled 2023-01-18 (×2): qty 1

## 2023-01-18 MED ORDER — POTASSIUM CHLORIDE CRYS ER 20 MEQ PO TBCR
40.0000 meq | EXTENDED_RELEASE_TABLET | Freq: Once | ORAL | Status: AC
  Administered 2023-01-18: 40 meq via ORAL
  Filled 2023-01-18: qty 2

## 2023-01-18 MED ORDER — ACETAMINOPHEN 650 MG RE SUPP: 650.0000 mg | RECTAL | Status: DC | PRN

## 2023-01-18 MED ORDER — STROKE: EARLY STAGES OF RECOVERY BOOK: Freq: Once | Status: AC

## 2023-01-18 MED ORDER — SENNOSIDES-DOCUSATE SODIUM 8.6-50 MG PO TABS: 1.0000 | ORAL_TABLET | Freq: Every evening | ORAL | Status: DC | PRN

## 2023-01-18 NOTE — Consult Note (Signed)
[facility] Telemedicine Consult     Requesting Provider: Acie Fredrickson Consult Participants: Patient  Location of the provider: [place], [place] Location of the patient: [facility]   This consult was provided via telemedicine with 2-way video and audio communication. The patient/family was informed that care would be provided in this way and agreed to receive care in this manner.      Chief Complaint: Left sided weakness   HPI: 62 yo M with a history of hypertension, hyperlipidemia, diabetes who presents with left-sided weakness that started a few days ago.  He states that he was dragging his left leg, but noticed it was worse on awakening today and therefore presented to the emergency department for further evaluation.  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  He has been admitted for further evaluation and physical therapy.       LKW: 1/21 tpa given?: No, out of window IR Thrombectomy? No, no LVO Modified Rankin Scale: 0-Completely asymptomatic and back to baseline post- stroke   Exam:     Vitals:    01/18/23 1328 01/18/23 1430  BP: 137/72 130/80  Pulse: (!) 111 94  Resp: 14 12  Temp: 98.3 F (36.8 C)    SpO2: 99% 100%      General: In bed, nad    1A: Level of Consciousness - 0 1B: Ask Month and Age - 0 1C: 'Blink Eyes' & 'Squeeze Hands' - 0 2: Test Horizontal Extraocular Movements - 0 3: Test Visual Fields - 0 4: Test Facial Palsy - 0 5A: Test Left Arm Motor Drift - 1 5B: Test Right Arm Motor Drift - 0 6A: Test Left Leg Motor Drift - 2 6B: Test Right Leg Motor Drift - 1 7: Test Limb Ataxia - 0 8: Test Sensation - 1 9: Test Language/Aphasia- 0 10: Test Dysarthria - 0 11: Test Extinction/Inattention - 0 NIHSS score: 5     Imaging Reviewed: MRI brain-right pontine infarct   Labs reviewed in epic and pertinent values follow: CBC is unremarkable BMP with hypokalemia and elevated glucose,  otherwise unremarkable     Assessment: 62 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  This is likely related to his small vessel risk factors of hypertension, hyperlipidemia, diabetes.  He is already at the point where we can begin gently correcting his blood pressure, so no need to hold all of his antihypertensives, but would not aggressively control it for a few days given his worsening this morning.  I would start him on both aspirin and Plavix for 3 weeks followed by aspirin monotherapy (not on any blood thinners at baseline).     Recommendations:  - HgbA1c, fasting lipid panel - Frequent neuro checks - Echocardiogram - Prophylactic therapy-Antiplatelet med: Aspirin - dose 81mg  and plavix 75mg  daily after 300mg  load for 3 weeks followed by aspirin monotherapy - Risk factor modification - Telemetry monitoring - PT consult, OT consult, Speech consult -His LDL is greater than 70, I would advance his statin therapy -Target hemoglobin A1c of less than seven     Roland Rack, MD [facility] [phone]   If 7pm- 7am, please page neurology on call as listed in [place].

## 2023-01-18 NOTE — Assessment & Plan Note (Addendum)
Chronic abdominal pain x 2 months.  No associated GI symptoms, no weight loss. + significant NSAID use. Last abdominal imaging 2012.  -CT abdomen and pelvis with contrast - Check Lipase

## 2023-01-18 NOTE — ED Provider Notes (Signed)
[place] EMERGENCY DEPARTMENT AT [facility] Provider Note   CSN: [id number] Arrival date & time: 01/18/23  1315     History {Add pertinent medical, surgical, social history, OB history to HPI:1} Chief Complaint  Patient presents with   Chest Pain   Weakness    Robert Lyons is a 62 y.o. male.   Chest Pain Associated symptoms: dizziness, headache, shortness of breath and weakness   Associated symptoms: no abdominal pain, no dysphagia, no fever, no nausea, no numbness, no palpitations and no vomiting   Weakness Associated symptoms: chest pain, dizziness, headaches and shortness of breath   Associated symptoms: no abdominal pain, no arthralgias, no dysuria, no fever, no nausea, no seizures and no vomiting       Robert Lyons is a 62 y.o. male with past medical history of GERD, type 2 diabetes, and hypertension who presents to the Emergency Department complaining of diffuse chest pain for multiple weeks. Pain is gradually worsening  with intermittent sharp stabbing pains.  He also has some shortness of breath with exertion.  He also describes having intermittent sharp stabbing pains to the top of his head also for weeks and gradually worsening.  Chest pain and head pain are not concurrent.  He has been experiencing some dizziness with movement and left-sided weakness/heaviness at least 3 days maybe longer, pt is unable to quantify.  He denies fever, cough, visual changes, neck pain or stiffness, nausea and vomiting  Pt is poor historian   Home Medications Prior to Admission medications   Medication Sig Start Date End Date Taking? Authorizing Provider  amLODipine (NORVASC) 5 MG tablet Take 1 tablet (5 mg total) by mouth daily. 01/05/23 04/05/23  Billie Lade, MD  atorvastatin (LIPITOR) 40 MG tablet Take 1 tablet (40 mg total) by mouth daily. 01/05/23   Billie Lade, MD  Blood Glucose Monitoring Suppl [facility] [place]) w/Device KIT 1 each by Does not apply  route as needed. 12/29/22   Billie Lade, MD  glucose blood [facility] ULTRA) test strip Use as instructed 12/29/22   Billie Lade, MD  Lancets [facility] ULTRASOFT) lancets Use as instructed once daily dx e11.9 12/29/22   Billie Lade, MD  lisinopril (ZESTRIL) 40 MG tablet Take 1 tablet (40 mg total) by mouth daily. 10/05/22   Jacquelin Hawking, PA-C  metFORMIN (GLUCOPHAGE) 850 MG tablet Take 1 tablet (850 mg total) by mouth 2 (two) times daily with a meal. 10/12/22   Jacquelin Hawking, PA-C  metoprolol tartrate (LOPRESSOR) 50 MG tablet Take 1 tablet (50 mg total) by mouth 2 (two) times daily. 10/05/22   Jacquelin Hawking, PA-C  pantoprazole (PROTONIX) 40 MG tablet Take 1 tablet (40 mg total) by mouth daily. 12/02/22 03/02/23  Billie Lade, MD  Semaglutide,0.25 or 0.5MG /DOS, (OZEMPIC, 0.25 OR 0.5 MG/DOSE,) 2 MG/3ML SOPN Inject 0.25 mg into the skin once a week. 01/06/23   Billie Lade, MD  tirzepatide [facility]) 2.5 MG/0.5ML Pen Inject 2.5 mg into the skin once a week. 01/05/23   Billie Lade, MD  Vitamin D, Ergocalciferol, (DRISDOL) 1.25 MG (50000 UNIT) CAPS capsule Take 1 capsule (50,000 Units total) by mouth every 7 (seven) days for 12 doses. 01/05/23 03/24/23  Billie Lade, MD      Allergies    Patient has no known allergies.    Review of Systems   Review of Systems  Constitutional:  Negative for appetite change, chills and fever.  HENT:  Negative for  congestion and trouble swallowing.   Eyes:  Negative for visual disturbance.  Respiratory:  Positive for shortness of breath. Negative for chest tightness.   Cardiovascular:  Positive for chest pain. Negative for palpitations and leg swelling.  Gastrointestinal:  Negative for abdominal pain, nausea and vomiting.  Genitourinary:  Negative for dysuria.  Musculoskeletal:  Negative for arthralgias, neck pain and neck stiffness.  Skin:  Negative for rash.  Neurological:  Positive for dizziness, weakness and headaches. Negative for  seizures, syncope, facial asymmetry, speech difficulty and numbness.  Psychiatric/Behavioral:  Negative for confusion.     Physical Exam Updated Vital Signs BP 137/72   Pulse (!) 111   Temp 98.3 F (36.8 C) (Oral)   Resp 14   SpO2 99%  Physical Exam Vitals and nursing note reviewed.  Constitutional:      General: He is not in acute distress.    Appearance: He is well-developed. He is not ill-appearing or toxic-appearing.  Eyes:     Extraocular Movements: Extraocular movements intact.     Conjunctiva/sclera: Conjunctivae normal.     Pupils: Pupils are equal, round, and reactive to light.  Cardiovascular:     Rate and Rhythm: Normal rate and regular rhythm.     Pulses: Normal pulses.  Pulmonary:     Effort: Pulmonary effort is normal. No respiratory distress.     Breath sounds: No wheezing.  Chest:     Chest wall: No tenderness.  Abdominal:     General: There is no distension.     Palpations: Abdomen is soft.     Tenderness: There is no abdominal tenderness.  Musculoskeletal:     Cervical back: Normal range of motion. No rigidity.     Right lower leg: No edema.     Left lower leg: No edema.  Skin:    General: Skin is warm.     Capillary Refill: Capillary refill takes less than 2 seconds.  Neurological:     General: No focal deficit present.     Mental Status: He is alert and oriented to person, place, and time.     GCS: GCS eye subscore is 4. GCS verbal subscore is 5. GCS motor subscore is 6.     Cranial Nerves: No dysarthria or facial asymmetry.     Motor: Weakness and pronator drift present.     Coordination: Coordination normal. Finger-Nose-Finger Test and Heel to [facility] Test normal.     Comments: CN II-XII intact.  Speech clear.  No facial droop.  Mild left pronator drift.  Grip strength slightly weaker on left compared to right.       ED Results / Procedures / Treatments   Labs (all labs ordered are listed, but only abnormal results are displayed) Labs Reviewed   BASIC METABOLIC PANEL - Abnormal; Notable for the following components:      Result Value   Potassium 3.2 (*)    Glucose, Bld 389 (*)    All other components within normal limits  CBC - Abnormal; Notable for the following components:   Hemoglobin 12.9 (*)    HCT 38.1 (*)    All other components within normal limits  ETHANOL  PROTIME-INR  APTT  DIFFERENTIAL  HEPATIC FUNCTION PANEL  RAPID URINE DRUG SCREEN, HOSP PERFORMED  URINALYSIS, ROUTINE W REFLEX MICROSCOPIC  TROPONIN I (HIGH SENSITIVITY)  TROPONIN I (HIGH SENSITIVITY)    EKG EKG Interpretation  Date/Time:  Wednesday January 18 2023 13:30:10 EST Ventricular Rate:  112 PR Interval:  154 QRS  Duration: 96 QT Interval:  344 QTC Calculation: 469 R Axis:   32 Text Interpretation: Sinus tachycardia Left ventricular hypertrophy with repolarization abnormality ( R in aVL , Romhilt-Estes ) Possible Inferior infarct , age undetermined Abnormal ECG Confirmed by Carmin Muskrat [phone]) on 01/18/2023 1:34:22 PM  Radiology MR BRAIN WO CONTRAST  Result Date: 01/18/2023 CLINICAL DATA:  Headache. EXAM: MRI HEAD WITHOUT  CONTRAST MRA HEAD WITHOUT CONTRAST TECHNIQUE: Multiplanar, multi-echo pulse sequences of the brain and surrounding structures were acquired without intravenous contrast. Angiographic images of the Circle of Willis were acquired using MRA technique without intravenous contrast. 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 Hemorrhage. No hydrocephalus. There is sequela of mild chronic microvascular ischemic change. No extra-axial fluid collection. Vascular: Normal flow voids. Skull and upper cervical spine: Normal marrow signal. Sinuses/Orbits: No acute or significant finding. Other: None. MRA HEAD FINDINGS Anterior circulation: Normal flow signal.  No stenosis or occlusion. Posterior circulation: Normal flow signal. No stenosis or occlusion.  Anatomic variants: None IMPRESSION: 1. 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 2. No intracranial large vessel occlusion or significant stenosis. Findings were discussed with Kem Parkinson, PA on 01/18/23 at 5:07 PM via telephone Electronically Signed   By: Marin Roberts M.D.   On: 01/18/2023 17:08   MR ANGIO HEAD WO CONTRAST  Result Date: 01/18/2023 CLINICAL DATA:  Headache. EXAM: MRI HEAD WITHOUT  CONTRAST MRA HEAD WITHOUT CONTRAST TECHNIQUE: Multiplanar, multi-echo pulse sequences of the brain and surrounding structures were acquired without intravenous contrast. Angiographic images of the Circle of Willis were acquired using MRA technique without intravenous contrast. 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 Hemorrhage. No hydrocephalus. There is sequela of mild chronic microvascular ischemic change. No extra-axial fluid collection. Vascular: Normal flow voids. Skull and upper cervical spine: Normal marrow signal. Sinuses/Orbits: No acute or significant finding. Other: None. MRA HEAD FINDINGS Anterior circulation: Normal flow signal.  No stenosis or occlusion. Posterior circulation: Normal flow signal. No stenosis or occlusion. Anatomic variants: None IMPRESSION: 1. 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 2. No intracranial large vessel occlusion or significant stenosis. Findings were discussed with Kem Parkinson, PA on 01/18/23 at 5:07 PM via telephone Electronically Signed   By: Marin Roberts M.D.   On: 01/18/2023 17:08   CT HEAD WO CONTRAST  Result Date: 01/18/2023 CLINICAL DATA:  Neuro deficit, acute, stroke suspected. Left-sided weakness and headache for 3 days. EXAM: CT HEAD WITHOUT CONTRAST TECHNIQUE: Contiguous axial images were obtained from the base of the skull through the vertex without intravenous contrast. RADIATION DOSE REDUCTION: This exam was performed according to the departmental  dose-optimization program which includes automated exposure control, adjustment of the mA and/or kV according to patient size and/or use of iterative reconstruction technique. COMPARISON:  Head CT and MRI 11/19/2012 FINDINGS: Brain: There is no evidence of an acute infarct, intracranial hemorrhage, mass, midline shift, or extra-axial fluid collection. The ventricles and sulci are normal. Vascular: Calcific atherosclerosis at the skull base. No hyperdense vessel. Skull: No fracture or suspicious osseous lesion. Sinuses/Orbits: Visualized paranasal sinuses and mastoid air cells are clear. Unremarkable orbits. Other: None. IMPRESSION: Negative head CT. Electronically Signed   By: Logan Bores M.D.   On: 01/18/2023 14:43   DG Chest 2 View  Result Date: 01/18/2023  CLINICAL DATA:  LEFT side chest pain intermittently, LEFT-sided weakness since Monday EXAM: CHEST - 2 VIEW COMPARISON:  02/23/2022 FINDINGS: Normal heart size, mediastinal contours, and pulmonary vascularity. Lungs clear. No pleural effusion or pneumothorax. Bones unremarkable. IMPRESSION: Normal exam. Electronically Signed   By: Lavonia Dana M.D.   On: 01/18/2023 13:56    Procedures Procedures  {Document cardiac monitor, telemetry assessment procedure when appropriate:1}  Medications Ordered in ED Medications  aspirin chewable tablet 81 mg (has no administration in time range)  clopidogrel (PLAVIX) tablet 300 mg (has no administration in time range)  clopidogrel (PLAVIX) tablet 75 mg (has no administration in time range)  potassium chloride SA (KLOR-CON M) CR tablet 40 mEq (40 mEq Oral Given 01/18/23 1702)  sodium chloride 0.9 % bolus 500 mL (500 mLs Intravenous New Bag/Given 01/18/23 1709)    ED Course/ Medical Decision Making/ A&P   {   Click here for ABCD2, HEART and other calculatorsREFRESH Note before signing :1}                          Medical Decision Making Patient here today 1 month complaints.  Complains of diffuse pain to the  top of his head, sharp stabbing in quality and intermittent.  He is also experiencing sharp stabbing pains of his upper chest.  The symptoms have been present for "a long time" states more than 1 to 2 weeks.  He also endorses having some left-sided weakness and dizziness that has been present for at least 3 days again maybe longer patient unable to quantify.  He denies any visual changes speech changes or sudden thunderclap headache.  He also complains of some exertional dyspnea.  On my exam, patient mentating well, vital signs reviewed, mildly tachycardic without tachypnea or hypoxia.  Blood pressure reassuring.  He does have some left-sided weakness of the left upper extremity compared to right.  There is a mild left-sided pronator drift as well.  He does have some generalized weakness of the lower extremities but I do not appreciate a focal deficit.  Differential would include but not limited to stroke, TIA, ACS, subarachnoid.  Symptoms also possibly related to infectious or metabolic process although I feel this is less likely.  Code stroke not activated due to duration of patient's symptoms  Amount and/or Complexity of Data Reviewed Labs: ordered.    Details: Labs interpreted labs interpreted by me, no evidence of leukocytosis, hemoglobin 12.9.  1 point decreased from 1 month ago.  Chemistries shows mild hypokalemia with potassium of 3.2.  Blood sugar 389 anion gap unremarkable.  Liver studies reassuring.  INR 1.1 blood alcohol less than 10, delta Trop unchanged Radiology: ordered.    Details: 2 view chest without acute cardiopulmonary process  CT head ordered for further evaluation, negative for acute intracranial changes.  I have high clinical suspicion for stroke, MRI brain ordered Shows acute infarct of the right pons, no evidence of LVO ECG/medicine tests: ordered.    Details: EKG shows sinus tachycardia with left ventricular hypertrophy with repolarization abnormality Discussion of  management or test interpretation with external provider(s): Findings discussed with neurology, Dr. Leonel Ramsay who recommends hospital admission here aspirin and clopidogrel  Risk Prescription drug management.     {Document critical care time when appropriate:1} {Document review of labs and clinical decision tools ie heart score, Chads2Vasc2 etc:1}  {Document your independent review of radiology images, and any outside records:1} {Document your discussion with family members, caretakers, and  with consultants:1} {Document social determinants of health affecting pt's care:1} {Document your decision making why or why not admission, treatments were needed:1} Final Clinical Impression(s) / ED Diagnoses Final diagnoses:  None    Rx / DC Orders ED Discharge Orders     None

## 2023-01-18 NOTE — ED Triage Notes (Signed)
Pt is here for left sided chest pain intermittent "for the longest" and left sided weakness since Monday.  No facial droop, speech clear.  Pt is alert and oriented

## 2023-01-18 NOTE — Assessment & Plan Note (Signed)
K- 3.2 - Replete K - Check mag

## 2023-01-18 NOTE — Assessment & Plan Note (Addendum)
Presenting with left-sided weakness upper and lower extremity.  No other focal neurologic deficits.  Symptoms at least/ more than 48 hours duration.  Outside window for intervention.  MRI brain-acute infarct right pons.  MRA head-no large vessel occlusion. -Evaluated by teleneurologist-admission for stroke workup, aspirin and Plavix for 3 weeks then aspirin monotherapy - Echocardiogram -Carotid Dopplers -PT OT evaluation - HgbA1c-8.7 - Lipid panel -Resume Atorvastatin 40 mg daily -Will resume blood pressure medications in a.m. as symptoms have been greater than 48 hours

## 2023-01-18 NOTE — H&P (Signed)
History and Physical    Robert Lyons YNW:[id number] DOB: 15-Feb-1961 DOA: 01/18/2023  PCP: [name], MD   Patient coming from: Home  I have personally briefly reviewed patient's old medical records in [place]  Chief Complaint: Chest pain, headache, left sided weakness  HPI: Robert Lyons is a 62 y.o. male with medical history significant for diabetes mellitus, hypertension.  Patient presented to the ED with multiple complaints-patient, chest pain, abdominal pain, and left-sided weakness. Patient reports left-sided chest pain has been going on for about 2 months probably more, chest pain is not dependent on activity, no known aggravating or relieving factors.  Reports associated difficulty breathing, unable to tell if with exertion or not.  Describes chest pain as pressure-like and sharp.  Lasting about 30 seconds then resolves.  No radiation. Reports onset of heaviness of his left upper and lower extremity that started at least 2 days ago likely more than that.  No slurred speech no facial asymmetry.  He does not take aspirin daily.  No prior history of strokes. Smokes about 3 cigarettes daily.  No alcohol abuse. Reports ongoing left-sided abdominal pain over the past 3 weeks to months.  No vomiting no loose stools, has maintained good oral intake.  Denies weight loss.  ED Course: Blood pressure 130s to 150s.  Stable vitals.  Potassium 3.2.  Chest x-ray clear.  Troponin 3 > 3.  MRI shows acute infarct right pons. EKG sinus tachycardia rate 112, no significant changes. Teleneurologist was consulted, evaluated in ED by Dr. Katherine Roan, recommends admission, aspirin and Plavix. 536mls bolus given.  Review of Systems: As per HPI all other systems reviewed and negative.  Past Medical History:  Diagnosis Date   GERD (gastroesophageal reflux disease)    Hypertension    Pneumonia    Type 2 diabetes mellitus ([place])     Past Surgical History:  Procedure Laterality Date    No prior surgery       reports that he has been smoking cigarettes. He has a 15.00 pack-year smoking history. He has never used smokeless tobacco. He reports that he does not drink alcohol and does not use drugs.  No Known Allergies  Family History  Problem Relation Age of Onset   Diabetes Mother    Hypertension Mother    Diabetes Maternal Aunt    Diabetes Maternal Uncle    Prior to Admission medications   Medication Sig Start Date End Date Taking? Authorizing Provider  acetaminophen (TYLENOL) 500 MG tablet Take 1,000 mg by mouth every 6 (six) hours as needed.   Yes [provider]  amLODipine (NORVASC) 5 MG tablet Take 1 tablet (5 mg total) by mouth daily. 01/05/23 04/05/23 Yes [name], MD  atorvastatin (LIPITOR) 40 MG tablet Take 1 tablet (40 mg total) by mouth daily. 01/05/23  Yes [name], MD  ibuprofen (ADVIL) 600 MG tablet Take 600 mg by mouth every 8 (eight) hours as needed. 10/17/22  Yes [provider]  lisinopril (ZESTRIL) 40 MG tablet Take 1 tablet (40 mg total) by mouth daily. 10/05/22  Yes Soyla Dryer, PA-C  metFORMIN (GLUCOPHAGE) 850 MG tablet Take 1 tablet (850 mg total) by mouth 2 (two) times daily with a meal. 10/12/22  Yes Soyla Dryer, PA-C  metoprolol tartrate (LOPRESSOR) 50 MG tablet Take 1 tablet (50 mg total) by mouth 2 (two) times daily. 10/05/22  Yes Soyla Dryer, PA-C  moxifloxacin (VIGAMOX) 0.5 % ophthalmic solution Apply to eye. 12/30/22  Yes [provider]  pantoprazole (PROTONIX) 40 MG tablet Take 1 tablet (40 mg total) by mouth daily. 12/02/22 03/02/23 Yes [name], MD  prednisoLONE acetate (PRED FORTE) 1 % ophthalmic suspension SMARTSIG:In Eye(s) 12/30/22  Yes [provider]  Vitamin D, Ergocalciferol, (DRISDOL) 1.25 MG (50000 UNIT) CAPS capsule Take 1 capsule (50,000 Units total) by mouth every 7 (seven) days for 12 doses. 01/05/23 03/24/23 Yes [name], MD  Blood Glucose Monitoring  Suppl [facility] ULTRALINK) w/Device KIT 1 each by Does not apply route as needed. 12/29/22   [name], MD  glucose blood [facility] ULTRA) test strip Use as instructed 12/29/22   [name], MD  Lancets [facility] ULTRASOFT) lancets Use as instructed once daily dx e11.9 12/29/22   [name], MD  Semaglutide,0.25 or 0.5MG /DOS, (OZEMPIC, 0.25 OR 0.5 MG/DOSE,) 2 MG/3ML SOPN Inject 0.25 mg into the skin once a week. 01/06/23   [name], MD  tirzepatide [facility]) 2.5 MG/0.5ML Pen Inject 2.5 mg into the skin once a week. Patient not taking: Reported on 01/18/2023 01/05/23   [name], MD    Physical Exam: Vitals:   01/18/23 1328 01/18/23 1430  BP: 137/72 130/80  Pulse: (!) 111 94  Resp: 14 12  Temp: 98.3 F (36.8 C)   TempSrc: Oral   SpO2: 99% 100%    Constitutional: NAD, calm, comfortable Vitals:   01/18/23 1328 01/18/23 1430  BP: 137/72 130/80  Pulse: (!) 111 94  Resp: 14 12  Temp: 98.3 F (36.8 C)   TempSrc: Oral   SpO2: 99% 100%   Eyes: PERRL, lids and conjunctivae normal ENMT: Mucous membranes are moist.   Neck: normal, supple, no masses, no thyromegaly Respiratory: clear to auscultation bilaterally, no wheezing, no crackles. Normal respiratory effort. No accessory muscle use.  Cardiovascular: Regular rate and rhythm, no murmurs / rubs / gallops. No extremity edema.  Extremities warm. Abdomen: Abdomen distended, soft, left sided tenderness, no masses palpated. No hepatosplenomegaly. .  Musculoskeletal: no clubbing / cyanosis. No joint deformity upper and lower extremities. Good ROM, no contractures. Normal muscle tone.  Skin: no rashes, lesions, ulcers. No induration Neurologic: Neurological:     Mental Status: he is alert.     GCS: GCS eye subscore is 4. GCS verbal subscore is 5. GCS motor subscore is 6.     Comments: Mental Status:  Alert, oriented, thought content appropriate, able to give a coherent history. Speech fluent without evidence of  aphasia. Able to follow 2 step commands without difficulty.  Cranial Nerves:  II:  Peripheral visual fields grossly normal, pupils equal, round, reactive to light III,IV, VI: ptosis not present, extra-ocular motions intact bilaterally  V,VII: smile symmetric, eyebrows raise symmetric, facial light touch sensation equal VIII: hearing grossly normal to voice  Motor:  Normal tone.  4+/5 strength bilateral lower extremity strength less on the left, slightly weaker on the left Sensory: Sensation intact to light touch in all extremities.      Psychiatric: Normal judgment and insight. Alert and oriented x 3. Normal mood.   Labs on Admission: I have personally reviewed following labs and imaging studies  CBC: Recent Labs  Lab 01/18/23 1402  WBC 7.2  NEUTROABS 4.2  HGB 12.9*  HCT 38.1*  MCV 89.4  PLT 161   Basic Metabolic Panel: Recent Labs  Lab 01/18/23 1402  NA 135  K 3.2*  CL 101  CO2 23  GLUCOSE 389*  BUN 11  CREATININE  1.06  CALCIUM 9.0   GFR: Estimated Creatinine Clearance: 76 mL/min (by C-G formula based on SCr of 1.06 mg/dL). Liver Function Tests: Recent Labs  Lab 01/18/23 1402  AST 28  ALT 19  ALKPHOS 58  BILITOT 1.0  PROT 7.1  ALBUMIN 4.1   Coagulation Profile: Recent Labs  Lab 01/18/23 1402  INR 1.1    Radiological Exams on Admission: MR BRAIN WO CONTRAST  Result Date: 01/18/2023 CLINICAL DATA:  Headache. EXAM: MRI HEAD WITHOUT  CONTRAST MRA HEAD WITHOUT CONTRAST TECHNIQUE: Multiplanar, multi-echo pulse sequences of the brain and surrounding structures were acquired without intravenous contrast. Angiographic images of the Circle of Willis were acquired using MRA technique without intravenous contrast. 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 Hemorrhage. No hydrocephalus. There is sequela of mild chronic microvascular ischemic change. No extra-axial fluid  collection. Vascular: Normal flow voids. Skull and upper cervical spine: Normal marrow signal. Sinuses/Orbits: No acute or significant finding. Other: None. MRA HEAD FINDINGS Anterior circulation: Normal flow signal.  No stenosis or occlusion. Posterior circulation: Normal flow signal. No stenosis or occlusion. Anatomic variants: None IMPRESSION: 1. 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 2. No intracranial large vessel occlusion or significant stenosis. Findings were discussed with Kem Parkinson, PA on 01/18/23 at 5:07 PM via telephone Electronically Signed   By: Marin Roberts M.D.   On: 01/18/2023 17:08   MR ANGIO HEAD WO CONTRAST  Result Date: 01/18/2023 CLINICAL DATA:  Headache. EXAM: MRI HEAD WITHOUT  CONTRAST MRA HEAD WITHOUT CONTRAST TECHNIQUE: Multiplanar, multi-echo pulse sequences of the brain and surrounding structures were acquired without intravenous contrast. Angiographic images of the Circle of Willis were acquired using MRA technique without intravenous contrast. 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 Hemorrhage. No hydrocephalus. There is sequela of mild chronic microvascular ischemic change. No extra-axial fluid collection. Vascular: Normal flow voids. Skull and upper cervical spine: Normal marrow signal. Sinuses/Orbits: No acute or significant finding. Other: None. MRA HEAD FINDINGS Anterior circulation: Normal flow signal.  No stenosis or occlusion. Posterior circulation: Normal flow signal. No stenosis or occlusion. Anatomic variants: None IMPRESSION: 1. 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 2. No intracranial large vessel occlusion or significant stenosis. Findings were discussed with Kem Parkinson, PA on 01/18/23 at 5:07 PM via telephone Electronically Signed   By: Marin Roberts M.D.   On: 01/18/2023 17:08   CT HEAD WO CONTRAST  Result Date: 01/18/2023 CLINICAL DATA:  Neuro  deficit, acute, stroke suspected. Left-sided weakness and headache for 3 days. EXAM: CT HEAD WITHOUT CONTRAST TECHNIQUE: Contiguous axial images were obtained from the base of the skull through the vertex without intravenous contrast. RADIATION DOSE REDUCTION: This exam was performed according to the departmental dose-optimization program which includes automated exposure control, adjustment of the mA and/or kV according to patient size and/or use of iterative reconstruction technique. COMPARISON:  Head CT and MRI 11/19/2012 FINDINGS: Brain: There is no evidence of an acute infarct, intracranial hemorrhage, mass, midline shift, or extra-axial fluid collection. The ventricles and sulci are normal. Vascular: Calcific atherosclerosis at the skull base. No hyperdense vessel. Skull: No fracture or suspicious osseous lesion. Sinuses/Orbits: Visualized paranasal sinuses and mastoid air cells are clear. Unremarkable orbits. Other: None. IMPRESSION: Negative head CT. Electronically Signed   By: Zenia Resides  Mosetta Putt M.D.   On: 01/18/2023 14:43   DG Chest 2 View  Result Date: 01/18/2023 CLINICAL DATA:  LEFT side chest pain intermittently, LEFT-sided weakness since Monday EXAM: CHEST - 2 VIEW COMPARISON:  02/23/2022 FINDINGS: Normal heart size, mediastinal contours, and pulmonary vascularity. Lungs clear. No pleural effusion or pneumothorax. Bones unremarkable. IMPRESSION: Normal exam. Electronically Signed   By: Ulyses Southward M.D.   On: 01/18/2023 13:56    EKG: Independently reviewed.  Sinus tachycardia rate 112.  LVH.  QTc 469.  No significant change from prior.  Assessment/Plan Principal Problem:   Acute CVA (cerebrovascular accident) [facility]) Active Problems:   Abdominal pain   Chest pain   Hypokalemia   Essential hypertension   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HCC)   Tobacco abuse   Assessment and Plan: * Acute CVA (cerebrovascular accident) (HCC) Presenting with left-sided weakness upper and  lower extremity.  No other focal neurologic deficits.  Symptoms at least/ more than 48 hours duration.  Outside window for intervention.  MRI brain-acute infarct right pons.  MRA head-no large vessel occlusion. -Evaluated by teleneurologist-admission for stroke workup, aspirin and Plavix for 3 weeks then aspirin monotherapy - Echocardiogram -Carotid Dopplers -PT OT evaluation - HgbA1c-8.7 - Lipid panel -Resume Atorvastatin 40 mg daily -Will resume blood pressure medications in a.m. as symptoms have been greater than 48 hours  Hypokalemia K- 3.2 - Replete K - Check mag  Chest pain Atypical chest pains associated difficulty breathing x about 2 months.  Troponins unremarkable 3 > 3.  EKG unchanged. -Obtain CTA chest - UDS - ECHO as part of stroke work up   Abdominal pain Chronic abdominal pain x 2 months.  No associated GI symptoms, no weight loss. + significant NSAID use. Last abdominal imaging 2012.  -CT abdomen and pelvis with contrast - Check Lipase   Tobacco abuse Ongoing tobacco abuse.  Smokes about 3 cigarettes daily. -  Declined nicotine patch -Counseled on the risks of ongoing tobacco abuse, and to quit smoking cigarettes  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HCC) Uncontrolled, A1c 8.7. -Hold metformin, semaglutide, Mounjaro - SSi- M  Essential hypertension Stable. - Will resume metoprolol 50 BID, Norvasc 5 -Hold lisinopril 40 mg daily for now with contrast exposure   DVT prophylaxis: Lovenox Code Status: Full Family Communication: Significant other at bedside Disposition Plan: ~2 days Consults called: None Admission status: Inpt tele I certify that at the point of admission it is my clinical judgment that the patient will require inpatient hospital care spanning beyond 2 midnights from the point of admission due to high intensity of service, high risk for further deterioration and high frequency of surveillance required.     Author: Onnie Boer, MD 01/18/2023 9:30 PM  For on call review [email or web address].

## 2023-01-18 NOTE — Assessment & Plan Note (Addendum)
Stable. - Will resume metoprolol 50 BID, Norvasc 5 -Hold lisinopril 40 mg daily for now with contrast exposure

## 2023-01-18 NOTE — Assessment & Plan Note (Addendum)
Atypical chest pains associated difficulty breathing x about 2 months.  Troponins unremarkable 3 > 3.  EKG unchanged. -Obtain CTA chest - UDS - ECHO as part of stroke work up

## 2023-01-18 NOTE — ED Notes (Signed)
Patient transported to MRI.

## 2023-01-18 NOTE — ED Notes (Signed)
See triage notes. Pt just arrived back from xray. Pt c/o sharp pains in head, sharp pains to all over chest into back for "days" intermittent. C/o sob. No resp distress or sob noted. Abd distended but soft. No ble swelling. C/o left sided weakness and hard to walk x 2 days. Pt a/o. Color wnl. Non diaphoretic. PA at bedside. NAD

## 2023-01-18 NOTE — Assessment & Plan Note (Signed)
Uncontrolled, A1c 8.7. -Hold metformin, semaglutide, Darcel Bayley - SSi- M

## 2023-01-18 NOTE — Assessment & Plan Note (Signed)
Ongoing tobacco abuse.  Smokes about 3 cigarettes daily. -  Declined nicotine patch -Counseled on the risks of ongoing tobacco abuse, and to quit smoking cigarettes

## 2023-01-19 ENCOUNTER — Inpatient Hospital Stay (HOSPITAL_COMMUNITY): Payer: Medicaid Other

## 2023-01-19 DIAGNOSIS — N281 Cyst of kidney, acquired: Secondary | ICD-10-CM | POA: Diagnosis not present

## 2023-01-19 DIAGNOSIS — R109 Unspecified abdominal pain: Secondary | ICD-10-CM | POA: Diagnosis not present

## 2023-01-19 DIAGNOSIS — I6523 Occlusion and stenosis of bilateral carotid arteries: Secondary | ICD-10-CM | POA: Diagnosis not present

## 2023-01-19 DIAGNOSIS — R0602 Shortness of breath: Secondary | ICD-10-CM | POA: Diagnosis not present

## 2023-01-19 DIAGNOSIS — R079 Chest pain, unspecified: Secondary | ICD-10-CM

## 2023-01-19 DIAGNOSIS — K5730 Diverticulosis of large intestine without perforation or abscess without bleeding: Secondary | ICD-10-CM | POA: Diagnosis not present

## 2023-01-19 DIAGNOSIS — Z8673 Personal history of transient ischemic attack (TIA), and cerebral infarction without residual deficits: Secondary | ICD-10-CM | POA: Diagnosis not present

## 2023-01-19 DIAGNOSIS — I6389 Other cerebral infarction: Secondary | ICD-10-CM

## 2023-01-19 DIAGNOSIS — I639 Cerebral infarction, unspecified: Secondary | ICD-10-CM | POA: Diagnosis not present

## 2023-01-19 DIAGNOSIS — R531 Weakness: Secondary | ICD-10-CM | POA: Diagnosis not present

## 2023-01-19 LAB — ECHOCARDIOGRAM COMPLETE
AR max vel: 2.92 cm2
AV Area VTI: 2.95 cm2
AV Area mean vel: 2.56 cm2
AV Mean grad: 2 mmHg
AV Peak grad: 4.6 mmHg
Ao pk vel: 1.07 m/s
Area-P 1/2: 3.7 cm2
Height: 71 in
MV VTI: 4.34 cm2
S' Lateral: 3 cm
Weight: 3072 oz

## 2023-01-19 LAB — LIPID PANEL
Cholesterol: 81 mg/dL (ref 0–200)
HDL: 34 mg/dL — ABNORMAL LOW (ref >40)
LDL Cholesterol: 29 mg/dL (ref 0–99)
Total CHOL/HDL Ratio: 2.4 RATIO
Triglycerides: 89 mg/dL (ref <150)
VLDL: 18 mg/dL (ref 0–40)

## 2023-01-19 LAB — GLUCOSE, CAPILLARY: Glucose-Capillary: 241 mg/dL — ABNORMAL HIGH (ref 70–99)

## 2023-01-19 MED ORDER — METOPROLOL TARTRATE 50 MG PO TABS: 50.0000 mg | ORAL_TABLET | Freq: Two times a day (BID) | ORAL | 4 refills | Status: DC

## 2023-01-19 MED ORDER — CALCIUM CARBONATE ANTACID 500 MG PO CHEW
400.0000 mg | CHEWABLE_TABLET | Freq: Once | ORAL | Status: AC
  Administered 2023-01-19: 400 mg via ORAL
  Filled 2023-01-19: qty 2

## 2023-01-19 MED ORDER — MAGNESIUM SULFATE 4 GM/100ML IV SOLN
4.0000 g | Freq: Once | INTRAVENOUS | Status: AC
  Administered 2023-01-19: 4 g via INTRAVENOUS
  Filled 2023-01-19: qty 100

## 2023-01-19 MED ORDER — IOHEXOL 300 MG/ML  SOLN
75.0000 mL | Freq: Once | INTRAMUSCULAR | Status: AC | PRN
  Administered 2023-01-19: 75 mL via INTRAVENOUS

## 2023-01-19 MED ORDER — LISINOPRIL 40 MG PO TABS: 40.0000 mg | ORAL_TABLET | Freq: Every day | ORAL | 3 refills | Status: DC

## 2023-01-19 MED ORDER — ASPIRIN 81 MG PO TBEC: 81.0000 mg | DELAYED_RELEASE_TABLET | Freq: Every day | ORAL | 11 refills | Status: AC

## 2023-01-19 MED ORDER — SODIUM CHLORIDE 0.9 % IV BOLUS
1000.0000 mL | Freq: Once | INTRAVENOUS | Status: AC
  Administered 2023-01-19: 1000 mL via INTRAVENOUS

## 2023-01-19 MED ORDER — ATORVASTATIN CALCIUM 80 MG PO TABS: 80.0000 mg | ORAL_TABLET | Freq: Every day | ORAL | 11 refills | Status: DC

## 2023-01-19 MED ORDER — METFORMIN HCL 850 MG PO TABS: 850.0000 mg | ORAL_TABLET | Freq: Two times a day (BID) | ORAL | 4 refills | Status: DC

## 2023-01-19 MED ORDER — PANTOPRAZOLE SODIUM 40 MG PO TBEC: 40.0000 mg | DELAYED_RELEASE_TABLET | Freq: Every day | ORAL | 11 refills | Status: DC

## 2023-01-19 MED ORDER — AMLODIPINE BESYLATE 5 MG PO TABS: 5.0000 mg | ORAL_TABLET | Freq: Every day | ORAL | 9 refills | Status: DC

## 2023-01-19 MED ORDER — ASPIRIN 81 MG PO TBEC: 81.0000 mg | DELAYED_RELEASE_TABLET | Freq: Every day | ORAL | Status: DC

## 2023-01-19 MED ORDER — OZEMPIC (0.25 OR 0.5 MG/DOSE) 2 MG/3ML ~~LOC~~ SOPN: 0.2500 mg | PEN_INJECTOR | SUBCUTANEOUS | 3 refills | Status: DC

## 2023-01-19 MED ORDER — CLOPIDOGREL BISULFATE 75 MG PO TABS: 75.0000 mg | ORAL_TABLET | Freq: Every day | ORAL | 11 refills | Status: DC

## 2023-01-19 NOTE — Inpatient Diabetes Management (Addendum)
Inpatient Diabetes Program Recommendations  AACE/ADA: New Consensus Statement on Inpatient Glycemic Control   Target Ranges:  Prepandial:   less than 140 mg/dL      Peak postprandial:   less than 180 mg/dL (1-2 hours)      Critically ill patients:  140 - 180 mg/dL    Latest Reference Range & Units 01/18/23 21:26  Glucose-Capillary 70 - 99 mg/dL 383 (H)  Novolog 5 units @21 :58    Latest Reference Range & Units 01/18/23 14:02  Glucose 70 - 99 mg/dL 389 (H)    Latest Reference Range & Units 10/05/22 08:38 12/02/22 15:38  Hemoglobin A1C 4.8 - 5.6 % 7.9 (H) 8.7 (H)   Review of Glycemic Control  Diabetes history: DM2 Outpatient Diabetes medications: Metformin 850 mg BID, Ozmepic 0.25 mg Qweek (has not started taking Ozempic yet) Current orders for Inpatient glycemic control: Novolog 0-15 units TID with meals, Novolog 0-5 units QHS  Inpatient Diabetes Program Recommendations:    Insulin: Nursing reports CBG 279 mg/dl this morning (not reflected in chart at this time).  Please consider ordering Semglee 8 units Q24H.  Outpatient DM medication: Patient is taking Metformin 850 mg BID for DM. Patient was told to stop Januvia recently on 01/05/23 by PCP and told to start Ozempic. Patient reports he filled Ozempic and just got it last week but he has not started and unsure about taking the medication because of concern about losing more weight. Patient has taken insulin in the past and if discharged on insulin, he would prefer using insulin pens. Anticipate patient will need additional DM medications to get DM under better control.  NOTE: Patient admitted with acute CVA, hypokalemia, chest pain, and abdominal pain. In reviewing the chart, noted patient seen PCP on 01/05/23 and per office note patient was taking Metformin 850 mg BID and Januvia 100 mg daily; A1C was 8.7% on 12/02/22 so patient was asked to stop Januvia and start on Mounjaro 2.5 mg Qweek. Per home medication list, patient is not taking  Mounjaro but is taking Ozempic 0.25 mg Qweek.  Patient reports that he has not started taking Ozempic yet.  NOTE: Spoke with patient about diabetes and home regimen for diabetes control. Patient reports being followed by PCP for diabetes management and currently taking Metformin 850 mg BID as an outpatient for diabetes control. Patient reports that he recently was prescribed Ozempic which he just got filled last week but he has not started taking it. Patient states that he did not start the [place] because it says it could cause weight loss and patient is already concerned that he has lost 7 pounds since he seen PCP on 01/05/23. So he was planing to talk with PCP about it before he decided if he was going to take it. Patient reports that he stopped taking Januvia as directed by PCP.  Patient reports checking glucose 2-3 times per day at home and it has been in the 200-300's mg/dl recently. Discussed initial glucose 389 mg/dl and explained that he may need additional DM medication as outpatient to get DM under control. Patient reports that he has taking insulin (vial/syringe) in the past. Patient states he would be amendable to taking insulin again if needed and he would prefer to use an insulin pen. Patient states he knows how to use insulin pens and that he has family that uses insulin pens and he feels it is easier than vial/syringe.   Discussed glucose and A1C goals. Discussed importance of checking CBGs and  maintaining good CBG control to prevent long-term and short-term complications.  Stressed to the patient the importance of improving glycemic control to prevent further complications from uncontrolled diabetes especially given CVA.  Patient appeared annoyed several times during conversation and  states he is getting very moody because he is hungry and they won't allow him to eat yet. Provided emotional support.  Encouraged patient to follow up with PCP in the near future.  Patient verbalized understanding of  information discussed and reports no further questions at this time related to diabetes.  Thanks, Barnie Alderman, RN, MSN, [place] Diabetes Coordinator Inpatient Diabetes Program [phone] (Team Pager from 8am to [place])

## 2023-01-19 NOTE — Evaluation (Signed)
Physical Therapy Evaluation Patient Details Name: Robert Lyons MRN: [id number] DOB: Feb 12, 1961 Today's Date: 01/19/2023  History of Present Illness  Robert Lyons is a 62 y.o. male with medical history significant for diabetes mellitus, hypertension.  Patient presented to the ED with multiple complaints-patient, chest pain, abdominal pain, and left-sided weakness.  Patient reports left-sided chest pain has been going on for about 2 months probably more, chest pain is not dependent on activity, no known aggravating or relieving factors.  Reports associated difficulty breathing, unable to tell if with exertion or not.  Describes chest pain as pressure-like and sharp.  Lasting about 30 seconds then resolves.  No radiation.  Reports onset of heaviness of his left upper and lower extremity that started at least 2 days ago likely more than that.  No slurred speech no facial asymmetry.  He does not take aspirin daily.  No prior history of strokes.  Smokes about 3 cigarettes daily.  No alcohol abuse.  Reports ongoing left-sided abdominal pain over the past 3 weeks to months.  No vomiting no loose stools, has maintained good oral intake.  Denies weight loss.   Clinical Impression  Patient functioning near baseline for functional mobility and gait other than having difficulty advancing LLE during gait training required verbal cues to lead with LLE to increase awareness and proprioception with good carryover demonstrated.  Patient tolerated sitting up at bedside after therapy with his granddaughter present in room.  Patient will benefit from continued skilled physical therapy in hospital and recommended venue below to increase strength, balance, endurance for safe ADLs and gait.      Recommendations for follow up therapy are one component of a multi-disciplinary discharge planning process, led by the attending physician.  Recommendations may be updated based on patient status, additional functional  criteria and insurance authorization.  Follow Up Recommendations Outpatient PT      Assistance Recommended at Discharge PRN  Patient can return home with the following  A little help with walking and/or transfers;Help with stairs or ramp for entrance;Assistance with cooking/housework    Equipment Recommendations None recommended by PT  Recommendations for Other Services       Functional Status Assessment Patient has had a recent decline in their functional status and demonstrates the ability to make significant improvements in function in a reasonable and predictable amount of time.     Precautions / Restrictions Precautions Precautions: None Restrictions Weight Bearing Restrictions: No      Mobility  Bed Mobility Overal bed mobility: Independent                  Transfers Overall transfer level: Needs assistance   Transfers: Sit to/from Stand, Bed to chair/wheelchair/BSC Sit to Stand: Supervision, Min guard   Step pivot transfers: Supervision, Min guard       General transfer comment: increased time, labored movement    Ambulation/Gait Ambulation/Gait assistance: Supervision, Modified independent (Device/Increase time) Gait Distance (Feet): 100 Feet Assistive device: None Gait Pattern/deviations: Decreased step length - left, Antalgic, Decreased stride length Gait velocity: decreased     General Gait Details: slightly labored cadence with mild diffiuclty advancing LLE due to weakness, fair/good return for ambulating in room, hallways without loss of balance  Stairs            Wheelchair Mobility    Modified Rankin (Stroke Patients Only)       Balance Overall balance assessment: Needs assistance Sitting-balance support: Feet supported, No upper extremity supported Sitting balance-Leahy Scale:  Good Sitting balance - Comments: seated EOB   Standing balance support: During functional activity, No upper extremity supported Standing  balance-Leahy Scale: Fair Standing balance comment: fair/good without AD                             Pertinent Vitals/Pain Pain Assessment Pain Assessment: No/denies pain    Home Living Family/patient expects to be discharged to:: Private residence Living Arrangements: Non-relatives/Friends Available Help at Discharge: Other (Comment) Type of Home: House Home Access: Stairs to enter Entrance Stairs-Rails: None Entrance Stairs-Number of Steps: 2 Alternate Level Stairs-Number of Steps: Patient states he does not go upstairs Home Layout: Two level;Able to live on main level with bedroom/bathroom;Full bath on main level Home Equipment: None      Prior Function Prior Level of Function : Independent/Modified Independent             Mobility Comments: Community ambulator without AD; Pt has not driven in 3 months. ADLs Comments: Indepndent     Hand Dominance   Dominant Hand: Right    Extremity/Trunk Assessment   Upper Extremity Assessment Upper Extremity Assessment: Defer to OT evaluation LUE Deficits / Details: 4+/5 grossly other than for wrist extension at 4-/5 and grip at 4-/5. Poor fine motor coordination but with confounding variable of history of hand fracture that limited fine motor function at baseline. LUE Sensation: decreased light touch LUE Coordination: decreased fine motor    Lower Extremity Assessment Lower Extremity Assessment: LLE deficits/detail LLE Deficits / Details: grossly 4+/5 LLE Sensation: decreased light touch;decreased proprioception LLE Coordination: decreased gross motor    Cervical / Trunk Assessment Cervical / Trunk Assessment: Normal  Communication   Communication: No difficulties  Cognition Arousal/Alertness: Awake/alert Behavior During Therapy: WFL for tasks assessed/performed Overall Cognitive Status: Within Functional Limits for tasks assessed                                          General  Comments      Exercises     Assessment/Plan    PT Assessment Patient needs continued PT services  PT Problem List Decreased strength;Decreased activity tolerance;Decreased balance;Decreased mobility       PT Treatment Interventions DME instruction;Gait training;Stair training;Functional mobility training;Therapeutic activities;Therapeutic exercise;Balance training;Patient/family education    PT Goals (Current goals can be found in the Care Plan section)  Acute Rehab PT Goals Patient Stated Goal: return home with family to assist PT Goal Formulation: With patient Time For Goal Achievement: 01/21/23 Potential to Achieve Goals: Good    Frequency Min 2X/week     Co-evaluation PT/OT/SLP Co-Evaluation/Treatment: Yes Reason for Co-Treatment: To address functional/ADL transfers PT goals addressed during session: Mobility/safety with mobility;Balance OT goals addressed during session: ADL's and self-care       AM-PAC PT "6 Clicks" Mobility  Outcome Measure Help needed turning from your back to your side while in a flat bed without using bedrails?: None Help needed moving from lying on your back to sitting on the side of a flat bed without using bedrails?: None Help needed moving to and from a bed to a chair (including a wheelchair)?: A Little Help needed standing up from a chair using your arms (e.g., wheelchair or bedside chair)?: A Little Help needed to walk in hospital room?: A Little Help needed climbing 3-5 steps with a railing? : A Little  6 Click Score: 20    End of Session Equipment Utilized During Treatment: Gait belt Activity Tolerance: Patient tolerated treatment well;Patient limited by fatigue Patient left: in bed;with call bell/phone within reach;with family/visitor present Nurse Communication: Mobility status PT Visit Diagnosis: Unsteadiness on feet (R26.81);Other abnormalities of gait and mobility (R26.89);Muscle weakness (generalized) (M62.81)    Time:  1308-6578 PT Time Calculation (min) (ACUTE ONLY): 29 min   Charges:   PT Evaluation $PT Eval Moderate Complexity: 1 Mod PT Treatments $Therapeutic Activity: 23-37 mins        11:53 AM, 01/19/23 Lonell Grandchild, MPT Physical Therapist with [employer] [phone] office [phone] mobile phone

## 2023-01-19 NOTE — Plan of Care (Signed)
  Problem: Acute Rehab PT Goals(only PT should resolve) Goal: Pt Will Go Supine/Side To Sit Outcome: Progressing Flowsheets (Taken 01/19/2023 1155) Pt will go Supine/Side to Sit: Independently Goal: Patient Will Transfer Sit To/From Stand Outcome: Progressing Flowsheets (Taken 01/19/2023 1155) Patient will transfer sit to/from stand: Independently Goal: Pt Will Transfer Bed To Chair/Chair To Bed Outcome: Progressing Flowsheets (Taken 01/19/2023 1155) Pt will Transfer Bed to Chair/Chair to Bed: with modified independence Goal: Pt Will Ambulate Outcome: Progressing Flowsheets (Taken 01/19/2023 1155) Pt will Ambulate:  > 125 feet  with modified independence   11:56 AM, 01/19/23 Lonell Grandchild, MPT Physical Therapist with [facility] 336 [phone] office [phone] mobile phone

## 2023-01-19 NOTE — Progress Notes (Signed)
Discharge instructions given on medications and follow up visits,patient verbalized understanding .Prescriptions sent to Pharmacy of choice documented on AVS. IV discontinued, catheter intact. Accompanied by staff to an awaiting vehicle. 

## 2023-01-19 NOTE — TOC Initial Note (Signed)
Transition of Care [facility]) - Initial/Assessment Note    Patient Details  Name: Robert Lyons MRN: [id number] Date of Birth: [birth year]  [facility] CM/SW Contact:    Shade Flood, LCSW Phone Number: 01/19/2023, 2:27 PM  Clinical Narrative:                  Pt admitted from home. PT/OT recommending outpatient therapy at dc. Spoke with pt family today to review recommendations. Family states that they are agreeable to the OP referral to AP site and that they will assist pt in making arrangements for transportation.  Referrals sent. TOC will follow.  Expected Discharge Plan: OP Rehab Barriers to Discharge: Continued Medical Work up   Patient Goals and CMS Choice Patient states their goals for this hospitalization and ongoing recovery are:: get better CMS Medicare.gov Compare Post Acute Care list provided to:: Patient Represenative (must comment) Choice offered to / list presented to : Adult Children      Expected Discharge Plan and Services In-house Referral: Clinical Social Work     Living arrangements for the past 2 months: Single Family Home                                      Prior Living Arrangements/Services Living arrangements for the past 2 months: Single Family Home Lives with:: Self Patient language and need for interpreter reviewed:: Yes Do you feel safe going back to the place where you live?: Yes      Need for Family Participation in Patient Care: Yes (Comment) Care giver support system in place?: Yes (comment)   Criminal Activity/Legal Involvement Pertinent to Current Situation/Hospitalization: No - Comment as needed  Activities of Daily Living Home Assistive Devices/Equipment: None ADL Screening (condition at time of admission) Patient's cognitive ability adequate to safely complete daily activities?: No Is the patient deaf or have difficulty hearing?: No Does the patient have difficulty seeing, even when wearing  glasses/contacts?: No Does the patient have difficulty concentrating, remembering, or making decisions?: No Patient able to express need for assistance with ADLs?: Yes Does the patient have difficulty dressing or bathing?: No Independently performs ADLs?: Yes (appropriate for developmental age) Does the patient have difficulty walking or climbing stairs?: No Weakness of Legs: Left Weakness of Arms/Hands: Left  Permission Sought/Granted Permission sought to share information with : Facility Art therapist granted to share information with : Yes, Verbal Permission Granted     Permission granted to share info w AGENCY: OP rehab        Emotional Assessment       Orientation: : Oriented to Self, Oriented to Place, Oriented to  Time, Oriented to Situation Alcohol / Substance Use: Not Applicable Psych Involvement: No (comment)  Admission diagnosis:  Acute CVA (cerebrovascular accident) [facility]) [I63.9] Patient Active Problem List   Diagnosis Date Noted   Acute CVA (cerebrovascular accident) ([place]) 01/18/2023   Abdominal pain 01/18/2023   Chest pain 01/18/2023   Tobacco abuse 01/18/2023   Hypokalemia 01/18/2023   Vitamin D deficiency 01/05/2023   GERD (gastroesophageal reflux disease) 12/08/2022   Cataracts, both eyes 12/08/2022   Encounter for general adult medical examination with abnormal findings 12/08/2022   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[place]) 40/98/1191   Essential hypertension 07/10/2017   Hyperlipidemia 07/10/2017   Cigarette nicotine dependence without complication 47/82/9562   PCP:  [name], MD Pharmacy:  [place] [place] [address] Phone: [phone] Fax: [phone]     Social Determinants of Health (SDOH) Social History: SDOH Screenings   Food Insecurity: No Food Insecurity (01/18/2023)  Housing: Low Risk  (01/18/2023)  Transportation Needs: No  Transportation Needs (01/18/2023)  Utilities: Not At Risk (01/18/2023)  Depression (PHQ2-9): Low Risk  (01/05/2023)  Recent Concern: Depression (PHQ2-9) - Medium Risk (12/02/2022)  Tobacco Use: High Risk (01/18/2023)   SDOH Interventions:     Readmission Risk Interventions     No data to display

## 2023-01-19 NOTE — Pre-Procedure Instructions (Signed)
Messaged Mackie Pai at Dr Imagene Gurney office about patients current in patient stay.

## 2023-01-19 NOTE — Discharge Summary (Signed)
Robert Lyons, is a 62 y.o. male  DOB 29-Jun-1961  MRN [id number].  Admission date:  01/18/2023  Admitting Physician  Bethena Roys, MD  Discharge Date:  01/19/2023   Primary MD  [name], MD  Recommendations for primary care physician for things to follow:   1)Please take Aspirin 81 mg daily along with Plavix 75 mg daily for 21 days then after that STOP the Plavix  and continue ONLY Aspirin 81 mg daily indefinitely--for secondary stroke Prevention   2) complete abstinence from tobacco advised--May use over-the-counter nicotine patch to help you quit smoking  3)Avoid ibuprofen/Advil/Aleve/Motrin/Goody Powders/Naproxen/BC powders/Meloxicam/Diclofenac/Indomethacin and other Nonsteroidal anti-inflammatory medications as these will make you more likely to bleed and can cause stomach ulcers, can also cause Kidney problems.  4)Your have an enlarged lymph node in your neck--- radiologist recommends PET scan as an outpatient to make sure this is not related to cancer--- with your permission I spoke with your daughter Ms. Fransisca Connors who tells me that she will drive you to the oncology appointment to schedule the PET scan  5)Please follow-up with hematologist/oncologist Dr. Derek Jack, MD---in 2 to 3 weeks to discuss possibly scheduling PET scan to look at the enlarged lymph node in your neck -Address: inside [facility] (4th Floor), [place], [place], [place] [address] Phone: [phone]  Admission Diagnosis  Acute CVA (cerebrovascular accident) [facility]) [I63.9]   Discharge Diagnosis  Acute CVA (cerebrovascular accident) [facility]) [I63.9]    Principal Problem:   Acute CVA (cerebrovascular accident) [facility]) Active Problems:   Abdominal pain   Chest pain   Hypokalemia   Essential hypertension   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[place])   Tobacco abuse       Past Medical History:  Diagnosis Date   GERD (gastroesophageal reflux disease)    Hypertension    Pneumonia    Type 2 diabetes mellitus ([place])     Past Surgical History:  Procedure Laterality Date   No prior surgery         HPI  from the history and physical done on the day of admission:     Chief Complaint: Chest pain, headache, left sided weakness   HPI: CHIMAOBI CASEBOLT is a 62 y.o. male with medical history significant for diabetes mellitus, hypertension.  Patient presented to the ED with multiple complaints-patient, chest pain, abdominal pain, and left-sided weakness. Patient reports left-sided chest pain has been going on for about 2 months probably more, chest pain is not dependent on activity, no known aggravating or relieving factors.  Reports associated difficulty breathing, unable to tell if with exertion or not.  Describes chest pain as pressure-like and sharp.  Lasting about 30 seconds then resolves.  No radiation. Reports onset of heaviness of his left upper and lower extremity that started at least 2 days ago likely more than that.  No slurred speech no facial asymmetry.  He does not take aspirin daily.  No prior history of strokes. Smokes about 3  cigarettes daily.  No alcohol abuse. Reports ongoing left-sided abdominal pain over the past 3 weeks to months.  No vomiting no loose stools, has maintained good oral intake.  Denies weight loss.   ED Course: Blood pressure 130s to 150s.  Stable vitals.  Potassium 3.2.  Chest x-ray clear.  Troponin 3 > 3.  MRI shows acute infarct right pons. EKG sinus tachycardia rate 112, no significant changes. Teleneurologist was consulted, evaluated in ED by Dr. Petra Kuba, recommends admission, aspirin and Plavix. bolus given.   Review of Systems: As per HPI all other systems reviewed and negative.    Hospital Course:     Assessment and Plan: * Acute CVA (cerebrovascular accident) (HCC) Presenting with left-sided  weakness upper and lower extremity.  No other focal neurologic deficits.  Symptoms at least/ more than 48 hours duration.  Outside window for intervention.  - MRI brain-acute infarct right pons.  MRA head-no large vessel occlusion. -Neurology consult from Dr. Amada Jupiter appreciated - Echocardiogram-with EF of 5560% without regional wall motion normalities, Grade 1 diastolic dysfunction noted -Carotid Dopplers without hemodynamically significant stenosis - HgbA1c-8.7 -LDL is 29, total cholesterol 81 HDL 34 -Even if his lipid panel is within desired limits, patient should still take Lipitor/Statin for it's Pleiotropic effects (beyond cholesterol lowering benefits) -- Take Lipitor 40 mg dailyPlease take Aspirin 81 mg daily along with Plavix 75 mg daily for 21 days then after that STOP the Plavix  and continue ONLY Aspirin 81 mg daily indefinitely--for secondary stroke Prevention (Per The multicenter SAMMPRIS trial)   Hypokalemia/hypomagnesemia -Replace and recheck  Chest pain Atypical chest pains associated difficulty breathing x about 2 months.  - Troponins unremarkable 3 > 3.  EKG unchanged. -Ruled out for ACS by serial troponins and EKG -CTA chest without acute PE or acute cardiopulmonary findings   Abdominal pain Chronic abdominal pain x 2 months.  No associated GI symptoms, no weight loss. + significant NSAID use.  -CT abdomen and pelvis with contrast-without acute abdominal pelvic findings -Lipase WNL   Tobacco abuse Ongoing tobacco abuse.   --Counseled on the risks of ongoing tobacco abuse, and to quit smoking cigarettes  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HCC) Uncontrolled, A1c 8.7--  Reflecting uncontrolled diabetes with hyperglycemia PTA -Resume PTA diabetic medications  Essential hypertension Stable. -Continue metoprolol and amlodipine -May restart lisinopril  Enlarged lymph node/abnormal neck and imaging finding-- -Lesion identified at the CTA  chest likely represents a right paratracheal lymph node measuring 3.1 x 1.1 x 1.3 cm. Lesion is immediately posterior to the thyroid lobe be very atypical to be a pedunculated thyroid lesion and with a normal calcium level, parathyroid adenoma is also very unlikely. Given the size of this node, it is concerning for malignancy. No primary lesions are evident in the neck. Recommend PET scan for further workup of occult malignancy. 2. Smaller paratracheal nodes are noted more inferiorly, the closest node measures 9 mm. --- Contacted patient and patient's daughter  Ms. Maeola Sarah who tells me that she will drive pt to the oncology appointment to schedule the PET scan -Outpatient follow-up with Dr. Ellin Saba and PET scan study as outpatient strongly advised  Discharge Condition: stable  Follow UP   Follow-up Information     Doreatha Massed, MD. Schedule an appointment as soon as possible for a visit in 1 week(s).   Specialty: Hematology Contact information: [address] [place] [address] [phone]  Consults obtained -neurology  Diet and Activity recommendation:  As advised  Discharge Instructions    Discharge Instructions     Ambulatory referral to Occupational Therapy   Complete by: As directed    Ambulatory referral to Physical Therapy   Complete by: As directed    Call MD for:  difficulty breathing, headache or visual disturbances   Complete by: As directed    Call MD for:  persistant dizziness or light-headedness   Complete by: As directed    Call MD for:  persistant nausea and vomiting   Complete by: As directed    Call MD for:  temperature >100.4   Complete by: As directed    Diet - low sodium heart healthy   Complete by: As directed    Discharge instructions   Complete by: As directed    1)Please take Aspirin 81 mg daily along with Plavix 75 mg daily for 21 days then after that STOP the Plavix  and continue ONLY Aspirin 81  mg daily indefinitely--for secondary stroke Prevention   2) complete abstinence from tobacco advised--May use over-the-counter nicotine patch to help you quit smoking  3)Avoid ibuprofen/Advil/Aleve/Motrin/Goody Powders/Naproxen/BC powders/Meloxicam/Diclofenac/Indomethacin and other Nonsteroidal anti-inflammatory medications as these will make you more likely to bleed and can cause stomach ulcers, can also cause Kidney problems.  4)Your have an enlarged lymph node in your neck--- radiologist recommends PET scan as an outpatient to make sure this is not related to cancer--- with your permission I spoke with your daughter Ms. Fransisca Connors who tells me that she will drive you to the oncology appointment to schedule the PET scan  5)Please follow-up with hematologist/oncologist Dr. Derek Jack, MD---in 2 to 3 weeks to discuss possibly scheduling PET scan to look at the enlarged lymph node in your neck -Address: inside [facility] (4th Floor), [place], [place], [place] [address] Phone: [phone]   Increase activity slowly   Complete by: As directed         Discharge Medications     Allergies as of 01/19/2023   No Known Allergies      Medication List     STOP taking these medications    ibuprofen 600 MG tablet Commonly known as: ADVIL   tirzepatide 2.5 MG/0.5ML Pen Commonly known as: MOUNJARO       TAKE these medications    acetaminophen 500 MG tablet Commonly known as: TYLENOL Take 1,000 mg by mouth every 6 (six) hours as needed.   amLODipine 5 MG tablet Commonly known as: NORVASC Take 1 tablet (5 mg total) by mouth daily.   aspirin EC 81 MG tablet Take 1 tablet (81 mg total) by mouth daily with breakfast. Please take Aspirin 81 mg daily along with Plavix 75 mg daily for 21 days then after that STOP the Plavix  and continue ONLY Aspirin 81 mg daily indefinitely--for secondary stroke Prevention Start taking on: January 20, 2023   atorvastatin 80 MG  tablet Commonly known as: Lipitor Take 1 tablet (80 mg total) by mouth daily. What changed:  medication strength how much to take   clopidogrel 75 MG tablet Commonly known as: PLAVIX Take 1 tablet (75 mg total) by mouth daily. Please take Aspirin 81 mg daily along with Plavix 75 mg daily for 21 days then after that STOP the Plavix  and continue ONLY Aspirin 81 mg daily indefinitely--for secondary stroke Prevention Start taking on: January 20, 2023   lisinopril 40 MG tablet Commonly known as: ZESTRIL Take  1 tablet (40 mg total) by mouth daily.   metFORMIN 850 MG tablet Commonly known as: GLUCOPHAGE Take 1 tablet (850 mg total) by mouth 2 (two) times daily with a meal.   metoprolol tartrate 50 MG tablet Commonly known as: LOPRESSOR Take 1 tablet (50 mg total) by mouth 2 (two) times daily.   moxifloxacin 0.5 % ophthalmic solution Commonly known as: VIGAMOX Apply to eye.   OneTouch Ultra test strip Generic drug: glucose blood Use as instructed   OneTouch UltraLink w/Device Kit 1 each by Does not apply route as needed.   onetouch ultrasoft lancets Use as instructed once daily dx e11.9   Ozempic (0.25 or 0.5 MG/DOSE) 2 MG/3ML Sopn Generic drug: Semaglutide(0.25 or 0.5MG /DOS) Inject 0.25 mg into the skin every Sunday. Start taking on: January 22, 2023 What changed: when to take this   pantoprazole 40 MG tablet Commonly known as: Protonix Take 1 tablet (40 mg total) by mouth daily.   prednisoLONE acetate 1 % ophthalmic suspension Commonly known as: PRED FORTE SMARTSIG:In Eye(s)   Vitamin D (Ergocalciferol) 1.25 MG (50000 UNIT) Caps capsule Commonly known as: DRISDOL Take 1 capsule (50,000 Units total) by mouth every 7 (seven) days for 12 doses.        Major procedures and Radiology Reports - PLEASE review detailed and final reports for all details, in brief -   CT SOFT TISSUE NECK W CONTRAST  Result Date: 01/19/2023 CLINICAL DATA:  Abnormality on CT angio  chest. Partially visualized nodule adjacent to the trachea. EXAM: CT NECK WITH CONTRAST TECHNIQUE: Multidetector CT imaging of the neck was performed using the standard protocol following the bolus administration of intravenous contrast. RADIATION DOSE REDUCTION: This exam was performed according to the departmental dose-optimization program which includes automated exposure control, adjustment of the mA and/or kV according to patient size and/or use of iterative reconstruction technique. CONTRAST:  69mL OMNIPAQUE IOHEXOL 300 MG/ML  SOLN COMPARISON:  CT angio chest 01/19/2023 FINDINGS: Pharynx and larynx: No focal mucosal or submucosal lesions are present. Nasopharynx is clear. Soft palate and tongue base are within normal limits. Vallecula and epiglottis are within normal limits. Aryepiglottic folds and piriform sinuses are clear. Vocal cords are midline and symmetric. Trachea is clear. Salivary glands: 11 mm ovoid hyperdensity in the posterior aspect of the left parotid gland likely represents an intraparotid lymph node. No other focal other smaller intraparotid lymph nodes are present bilaterally. Submandibular and parotid glands are otherwise within normal limits. Thyroid: Normal Lymph nodes: Lesion identified at the CTA chest measures 3.1 x 1.1 x 1.3 cm. A 9 mm right paratracheal lymph node is present slightly more inferiorly. No enlarged cervical chain nodes are present. Vascular: Atherosclerotic calcifications are present at the carotid bifurcations bilaterally without significant stenosis. Some calcifications are also present along the undersurface of the aortic arch. Great vessel origins are unremarkable. Limited intracranial: Within normal limits. Visualized orbits: The globes and orbits are within normal limits. Mastoids and visualized paranasal sinuses: The paranasal sinuses and mastoid air cells are clear. Skeleton: Vertebral body heights and alignment are normal. No acute or focal osseous lesions are  present. Upper chest: The lung apices are clear. Small paratracheal nodes are again noted. IMPRESSION: 1. Lesion identified at the CTA chest likely represents a right paratracheal lymph node measuring 3.1 x 1.1 x 1.3 cm. Lesion is immediately posterior to the thyroid lobe be very atypical to be a pedunculated thyroid lesion and with a normal calcium level, parathyroid adenoma is also very unlikely.  Given the size of this node, it is concerning for malignancy. No primary lesions are evident in the neck. Recommend PET scan for further workup of occult malignancy. 2. Smaller paratracheal nodes are noted more inferiorly, the closest node measures 9 mm. 3. No enlarged cervical chain nodes. 4.  Aortic Atherosclerosis (ICD10-I70.0). Electronically Signed   By: San Morelle M.D.   On: 01/19/2023 16:52   ECHOCARDIOGRAM COMPLETE  Result Date: 01/19/2023    ECHOCARDIOGRAM REPORT   Patient Name:   KEVAUGHN CUTBIRTH Date of Exam: 01/19/2023 Medical Rec #:  [id number]         Height:       71.0 in Accession #:    [id number]        Weight:       192.0 lb Date of Birth:  1961-02-27        BSA:          2.072 m Patient Age:    50 years          BP:           150/80 mmHg Patient Gender: M                 HR:           75 bpm. Exam Location:  [facility] Procedure: 2D Echo, Cardiac Doppler and Color Doppler Indications:    Stroke, Chest Pain  History:        Patient has prior history of Echocardiogram examinations, most                 recent 02/12/2021. Stroke, Signs/Symptoms:Chest Pain; Risk                 Factors:Hypertension, Diabetes, Dyslipidemia and Current Smoker.  Sonographer:    Wenda Low Referring Phys: [place]  1. Left ventricular ejection fraction, by estimation, is 55 to 60%. The left ventricle has normal function. The left ventricle has no regional wall motion abnormalities. There is moderate concentric left ventricular hypertrophy. Left ventricular diastolic parameters are  consistent with Grade I diastolic dysfunction (impaired relaxation).  2. Right ventricular systolic function is normal. The right ventricular size is normal. Tricuspid regurgitation signal is inadequate for assessing PA pressure.  3. The mitral valve is grossly normal. Trivial mitral valve regurgitation.  4. The aortic valve is tricuspid. Aortic valve regurgitation is not visualized.  5. The inferior vena cava is normal in size with greater than 50% respiratory variability, suggesting right atrial pressure of 3 mmHg. Comparison(s): No significant change from prior study. Prior images reviewed side by side. FINDINGS  Left Ventricle: Left ventricular ejection fraction, by estimation, is 55 to 60%. The left ventricle has normal function. The left ventricle has no regional wall motion abnormalities. The left ventricular internal cavity size was normal in size. There is  moderate concentric left ventricular hypertrophy. Left ventricular diastolic parameters are consistent with Grade I diastolic dysfunction (impaired relaxation). Right Ventricle: The right ventricular size is normal. No increase in right ventricular wall thickness. Right ventricular systolic function is normal. Tricuspid regurgitation signal is inadequate for assessing PA pressure. Left Atrium: Left atrial size was normal in size. Right Atrium: Right atrial size was normal in size. Pericardium: There is no evidence of pericardial effusion. Mitral Valve: The mitral valve is grossly normal. Trivial mitral valve regurgitation. MV peak gradient, 1.5 mmHg. The mean mitral valve gradient is 0.0 mmHg. Tricuspid Valve: The tricuspid valve is grossly normal. Tricuspid valve  regurgitation is trivial. Aortic Valve: The aortic valve is tricuspid. Aortic valve regurgitation is not visualized. Aortic valve mean gradient measures 2.0 mmHg. Aortic valve peak gradient measures 4.6 mmHg. Aortic valve area, by VTI measures 2.95 cm. Pulmonic Valve: The pulmonic valve was  grossly normal. Pulmonic valve regurgitation is trivial. Aorta: The aortic root is normal in size and structure. Venous: The inferior vena cava is normal in size with greater than 50% respiratory variability, suggesting right atrial pressure of 3 mmHg. IAS/Shunts: No atrial level shunt detected by color flow Doppler.  LEFT VENTRICLE PLAX 2D LVIDd:         4.10 cm   Diastology LVIDs:         3.00 cm   LV e' medial:    6.42 cm/s LV PW:         1.50 cm   LV E/e' medial:  7.7 LV IVS:        1.40 cm   LV e' lateral:   9.25 cm/s LVOT diam:     2.10 cm   LV E/e' lateral: 5.3 LV SV:         72 LV SV Index:   35 LVOT Area:     3.46 cm  RIGHT VENTRICLE RV Basal diam:  3.85 cm RV Mid diam:    3.50 cm RV S prime:     8.59 cm/s TAPSE (M-mode): 2.6 cm LEFT ATRIUM             Index        RIGHT ATRIUM           Index LA diam:        3.90 cm 1.88 cm/m   RA Area:     19.60 cm LA Vol (A2C):   57.9 ml 27.94 ml/m  RA Volume:   61.30 ml  29.58 ml/m LA Vol (A4C):   53.9 ml 26.01 ml/m LA Biplane Vol: 60.7 ml 29.29 ml/m  AORTIC VALVE                    PULMONIC VALVE AV Area (Vmax):    2.92 cm     PV Vmax:       0.66 m/s AV Area (Vmean):   2.56 cm     PV Peak grad:  1.7 mmHg AV Area (VTI):     2.95 cm AV Vmax:           107.00 cm/s AV Vmean:          71.800 cm/s AV VTI:            0.244 m AV Peak Grad:      4.6 mmHg AV Mean Grad:      2.0 mmHg LVOT Vmax:         90.30 cm/s LVOT Vmean:        53.000 cm/s LVOT VTI:          0.208 m LVOT/AV VTI ratio: 0.85  AORTA Ao Root diam: 3.80 cm MITRAL VALVE MV Area (PHT): 3.70 cm    SHUNTS MV Area VTI:   4.34 cm    Systemic VTI:  0.21 m MV Peak grad:  1.5 mmHg    Systemic Diam: 2.10 cm MV Mean grad:  0.0 mmHg MV Vmax:       0.62 m/s MV Vmean:      31.8 cm/s MV Decel Time: 205 msec MV E velocity: 49.20 cm/s MV A velocity: 61.90 cm/s MV E/A ratio:  0.79  Rozann Lesches MD Electronically signed by Rozann Lesches MD Signature Date/Time: 01/19/2023/11:42:05 AM    Final    CT Angio Chest  Pulmonary Embolism (PE) W or WO Contrast  Result Date: 01/19/2023 CLINICAL DATA:  Chest pain and shortness of breath. Left-sided abdominal pain. EXAM: CT ANGIOGRAPHY CHEST CT ABDOMEN AND PELVIS WITH CONTRAST TECHNIQUE: Multidetector CT imaging of the chest was performed using the standard protocol during bolus administration of intravenous contrast. Multiplanar CT image reconstructions and MIPs were obtained to evaluate the vascular anatomy. Multidetector CT imaging of the abdomen and pelvis was performed using the standard protocol during bolus administration of intravenous contrast. RADIATION DOSE REDUCTION: This exam was performed according to the departmental dose-optimization program which includes automated exposure control, adjustment of the mA and/or kV according to patient size and/or use of iterative reconstruction technique. CONTRAST:  124mL OMNIPAQUE IOHEXOL 350 MG/ML SOLN COMPARISON:  CT abdomen pelvis dated 09/28/2021. FINDINGS: CTA CHEST FINDINGS Cardiovascular: There is no cardiomegaly or pericardial effusion. Mild atherosclerotic calcification of the aortic arch. No aneurysmal dilatation or dissection. The origins of the great vessels of the aortic arch appear patent. No pulmonary artery embolus identified. Mediastinum/Nodes: There is no hilar or mediastinal adenopathy. The esophagus is grossly unremarkable. A partially visualized 1.6 x 1.3 cm ovoid soft tissue nodule to the right of trachea (5/4) concerning for an enlarged lymph node. Further evaluation with neck CT with IV contrast recommended. No mediastinal fluid collection. Lungs/Pleura: No focal consolidation, pleural effusion, or pneumothorax. The central airways are patent. Musculoskeletal: Degenerative changes of the spine. No acute osseous pathology. Review of the MIP images confirms the above findings. CT ABDOMEN and PELVIS FINDINGS No intra-abdominal free air or free fluid. Hepatobiliary: Subcentimeter hepatic hypodense focus is too  small to characterize. The liver is otherwise unremarkable. No biliary ductal dilatation. The gallbladder is unremarkable. Pancreas: Unremarkable. No pancreatic ductal dilatation or surrounding inflammatory changes. Spleen: Normal in size without focal abnormality. Adrenals/Urinary Tract: The adrenal glands are unremarkable. Small bilateral renal cysts. No imaging follow-up. There is no hydronephrosis on either side. There is symmetric enhancement and excretion of contrast by both kidneys. The visualized ureters and urinary bladder appear unremarkable. Stomach/Bowel: There is moderate stool throughout the colon. Scattered distal colonic diverticula without active inflammatory changes. There is no bowel obstruction active inflammation. The appendix is normal. Vascular/Lymphatic: Mild aortoiliac atherosclerotic disease. 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 The iliac arteries remain patent. The IVC is unremarkable. No portal venous gas. There is no adenopathy. Reproductive: The prostate and seminal vesicles are grossly unremarkable. No pelvic mass. Other: None Musculoskeletal: Mild degenerative changes. No acute osseous pathology. Review of the MIP images confirms the above findings. IMPRESSION: 1. No acute intrathoracic, abdominal, or pelvic pathology. No pulmonary artery embolus identified. 2. Mild colonic diverticulosis. No bowel obstruction. Normal appendix. 3. Partially visualized 1.6 x 1.3 cm ovoid soft tissue nodule to the right of the trachea concerning for an enlarged lymph node. Further evaluation with neck CT with IV contrast recommended. 4.  Aortic Atherosclerosis (ICD10-I70.0). Electronically Signed   By: Anner Crete M.D.   On: 01/19/2023 00:42   CT ABDOMEN PELVIS W CONTRAST  Result Date: 01/19/2023 CLINICAL DATA:  Chest pain and shortness of breath. Left-sided abdominal pain. EXAM: CT ANGIOGRAPHY CHEST CT ABDOMEN AND PELVIS WITH CONTRAST TECHNIQUE:  Multidetector CT imaging of the chest was performed using the standard protocol during bolus administration of intravenous contrast. Multiplanar CT image reconstructions and MIPs were  obtained to evaluate the vascular anatomy. Multidetector CT imaging of the abdomen and pelvis was performed using the standard protocol during bolus administration of intravenous contrast. RADIATION DOSE REDUCTION: This exam was performed according to the departmental dose-optimization program which includes automated exposure control, adjustment of the mA and/or kV according to patient size and/or use of iterative reconstruction technique. CONTRAST:  17mL OMNIPAQUE IOHEXOL 350 MG/ML SOLN COMPARISON:  CT abdomen pelvis dated 09/28/2021. FINDINGS: CTA CHEST FINDINGS Cardiovascular: There is no cardiomegaly or pericardial effusion. Mild atherosclerotic calcification of the aortic arch. No aneurysmal dilatation or dissection. The origins of the great vessels of the aortic arch appear patent. No pulmonary artery embolus identified. Mediastinum/Nodes: There is no hilar or mediastinal adenopathy. The esophagus is grossly unremarkable. A partially visualized 1.6 x 1.3 cm ovoid soft tissue nodule to the right of trachea (5/4) concerning for an enlarged lymph node. Further evaluation with neck CT with IV contrast recommended. No mediastinal fluid collection. Lungs/Pleura: No focal consolidation, pleural effusion, or pneumothorax. The central airways are patent. Musculoskeletal: Degenerative changes of the spine. No acute osseous pathology. Review of the MIP images confirms the above findings. CT ABDOMEN and PELVIS FINDINGS No intra-abdominal free air or free fluid. Hepatobiliary: Subcentimeter hepatic hypodense focus is too small to characterize. The liver is otherwise unremarkable. No biliary ductal dilatation. The gallbladder is unremarkable. Pancreas: Unremarkable. No pancreatic ductal dilatation or surrounding inflammatory changes. Spleen:  Normal in size without focal abnormality. Adrenals/Urinary Tract: The adrenal glands are unremarkable. Small bilateral renal cysts. No imaging follow-up. There is no hydronephrosis on either side. There is symmetric enhancement and excretion of contrast by both kidneys. The visualized ureters and urinary bladder appear unremarkable. Stomach/Bowel: There is moderate stool throughout the colon. Scattered distal colonic diverticula without active inflammatory changes. There is no bowel obstruction active inflammation. The appendix is normal. Vascular/Lymphatic: Mild aortoiliac atherosclerotic disease. 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 The iliac arteries remain patent. The IVC is unremarkable. No portal venous gas. There is no adenopathy. Reproductive: The prostate and seminal vesicles are grossly unremarkable. No pelvic mass. Other: None Musculoskeletal: Mild degenerative changes. No acute osseous pathology. Review of the MIP images confirms the above findings. IMPRESSION: 1. No acute intrathoracic, abdominal, or pelvic pathology. No pulmonary artery embolus identified. 2. Mild colonic diverticulosis. No bowel obstruction. Normal appendix. 3. Partially visualized 1.6 x 1.3 cm ovoid soft tissue nodule to the right of the trachea concerning for an enlarged lymph node. Further evaluation with neck CT with IV contrast recommended. 4.  Aortic Atherosclerosis (ICD10-I70.0). Electronically Signed   By: Anner Crete M.D.   On: 01/19/2023 00:42   MR BRAIN WO CONTRAST  Result Date: 01/18/2023 CLINICAL DATA:  Headache. EXAM: MRI HEAD WITHOUT  CONTRAST MRA HEAD WITHOUT CONTRAST TECHNIQUE: Multiplanar, multi-echo pulse sequences of the brain and surrounding structures were acquired without intravenous contrast. Angiographic images of the Circle of Willis were acquired using MRA technique without intravenous contrast. 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 Hemorrhage. No hydrocephalus. There is sequela of mild chronic microvascular ischemic change. No extra-axial fluid collection. Vascular: Normal flow voids. Skull and upper cervical spine: Normal marrow signal. Sinuses/Orbits: No acute or significant finding. Other: None. MRA HEAD FINDINGS Anterior circulation: Normal flow signal.  No stenosis or occlusion. Posterior circulation: Normal flow signal. No stenosis or occlusion. Anatomic variants: None IMPRESSION: 1.  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 2. No intracranial large vessel occlusion or significant stenosis. Findings were discussed with Kem Parkinson, PA on 01/18/23 at 5:07 PM via telephone Electronically Signed   By: Marin Roberts M.D.   On: 01/18/2023 17:08   MR ANGIO HEAD WO CONTRAST  Result Date: 01/18/2023 CLINICAL DATA:  Headache. EXAM: MRI HEAD WITHOUT  CONTRAST MRA HEAD WITHOUT CONTRAST TECHNIQUE: Multiplanar, multi-echo pulse sequences of the brain and surrounding structures were acquired without intravenous contrast. Angiographic images of the Circle of Willis were acquired using MRA technique without intravenous contrast. 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 Hemorrhage. No hydrocephalus. There is sequela of mild chronic microvascular ischemic change. No extra-axial fluid collection. Vascular: Normal flow voids. Skull and upper cervical spine: Normal marrow signal. Sinuses/Orbits: No acute or significant finding. Other: None. MRA HEAD FINDINGS Anterior circulation: Normal flow signal.  No stenosis or occlusion. Posterior circulation: Normal flow signal. No stenosis or occlusion. Anatomic variants: None IMPRESSION: 1. 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 2. No intracranial large vessel occlusion or significant stenosis. Findings were discussed with Kem Parkinson, PA on 01/18/23 at 5:07 PM via telephone Electronically Signed   By: Marin Roberts M.D.   On: 01/18/2023 17:08   CT HEAD WO CONTRAST  Result Date: 01/18/2023 CLINICAL DATA:  Neuro deficit, acute, stroke suspected. Left-sided weakness and headache for 3 days. EXAM: CT HEAD WITHOUT CONTRAST TECHNIQUE: Contiguous axial images were obtained from the base of the skull through the vertex without intravenous contrast. RADIATION DOSE REDUCTION: This exam was performed according to the departmental dose-optimization program which includes automated exposure control, adjustment of the mA and/or kV according to patient size and/or use of iterative reconstruction technique. COMPARISON:  Head CT and MRI 11/19/2012 FINDINGS: Brain: There is no evidence of an acute infarct, intracranial hemorrhage, mass, midline shift, or extra-axial fluid collection. The ventricles and sulci are normal. Vascular: Calcific atherosclerosis at the skull base. No hyperdense vessel. Skull: No fracture or suspicious osseous lesion. Sinuses/Orbits: Visualized paranasal sinuses and mastoid air cells are clear. Unremarkable orbits. Other: None. IMPRESSION: Negative head CT. Electronically Signed   By: Logan Bores M.D.   On: 01/18/2023 14:43   DG Chest 2 View  Result Date: 01/18/2023 CLINICAL DATA:  LEFT side chest pain intermittently, LEFT-sided weakness since Monday EXAM: CHEST - 2 VIEW COMPARISON:  02/23/2022 FINDINGS: Normal heart size, mediastinal contours, and pulmonary vascularity. Lungs clear. No pleural effusion or pneumothorax. Bones unremarkable. IMPRESSION: Normal exam. Electronically Signed   By: Lavonia Dana M.D.   On: 01/18/2023 13:56      Today   Subjective    Aaron Mose today has no new concerns -Left-sided weakness improving -     -Discussed with patient's girlfriend [name] at bedside -With patient's permission called and updated patient's daughter Ms Fransisca Connors   Patient has been seen and  examined prior to discharge   Objective   Blood pressure 137/72, pulse 77, temperature 97.8 F (36.6 C), temperature source Oral, resp. rate 17, height 5\' 11"  (1.803 m), weight 87.1 kg, SpO2 98 %.   Intake/Output Summary (Last 24 hours) at 01/19/2023 1806 Last data filed at 01/19/2023 1740 Gross per 24 hour  Intake 1037.22 ml  Output --  Net 1037.22 ml    Exam Gen:- Awake Alert, no acute distress  HEENT:- [place].AT, No sclera icterus  Neck-Supple Neck,No JVD,.  Lungs-  CTAB , good air movement bilaterally CV- S1, S2 normal, regular Abd-  +ve B.Sounds, Abd Soft, No tenderness,    Extremity/Skin:- No  edema,   good pulses Psych-affect is appropriate, oriented x3 Neuro-speech difficultly improved left upper and left lower extremity weakness and numbness,  no tremors    Data Review   CBC w Diff:  Lab Results  Component Value Date   WBC 7.2 01/18/2023   HGB 12.9 (L) 01/18/2023   HGB 13.9 12/02/2022   HCT 38.1 (L) 01/18/2023   HCT 41.5 12/02/2022   PLT 206 01/18/2023   PLT 234 12/02/2022   LYMPHOPCT 27 01/18/2023   BANDSPCT 0 01/09/2009   MONOPCT 11 01/18/2023   EOSPCT 2 01/18/2023   BASOPCT 1 01/18/2023    CMP:  Lab Results  Component Value Date   NA 135 01/18/2023   NA 138 12/02/2022   K 3.2 (L) 01/18/2023   CL 101 01/18/2023   CO2 23 01/18/2023   BUN 11 01/18/2023   BUN 12 12/02/2022   CREATININE 1.06 01/18/2023   PROT 7.1 01/18/2023   PROT 7.3 12/02/2022   ALBUMIN 4.1 01/18/2023   ALBUMIN 4.8 12/02/2022   BILITOT 1.0 01/18/2023   BILITOT 0.6 12/02/2022   ALKPHOS 58 01/18/2023   AST 28 01/18/2023   ALT 19 01/18/2023  .  Total Discharge time is about 33 minutes  Roxan Hockey M.D on 01/19/2023 at 6:06 PM  Go to [email or web address] -  for contact info  [facility] - Office  [phone]

## 2023-01-19 NOTE — Progress Notes (Signed)
Chart reviewed:   LDL 29, no changes needed to statin  Echo- no embolic source.   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  This is likely related to his small vessel risk factors of hypertension, hyperlipidemia, diabetes .  Recommendations: 1) Continue ASA 81mg  daily + Plavix 75mg  daily x 3 weeks, followed by asa 81mg  daily 2) DM control with gaol A1C < 7 3) continue home lipitor 40mg  qhs 4) PT,OT,ST 5) Please call with any further questions or concerns.   Roland Rack, MD [facility] [phone]  If 7pm- 7am, please page neurology on call as listed in [place].

## 2023-01-19 NOTE — Evaluation (Signed)
Occupational Therapy Evaluation Patient Details Name: Robert Lyons MRN: [id number] DOB: January 15, 1961 Today's Date: 01/19/2023   History of Present Illness Robert Lyons is a 62 y.o. male with medical history significant for diabetes mellitus, hypertension.  Patient presented to the ED with multiple complaints-patient, chest pain, abdominal pain, and left-sided weakness.  Patient reports left-sided chest pain has been going on for about 2 months probably more, chest pain is not dependent on activity, no known aggravating or relieving factors.  Reports associated difficulty breathing, unable to tell if with exertion or not.  Describes chest pain as pressure-like and sharp.  Lasting about 30 seconds then resolves.  No radiation.  Reports onset of heaviness of his left upper and lower extremity that started at least 2 days ago likely more than that.  No slurred speech no facial asymmetry.  He does not take aspirin daily.  No prior history of strokes.  Smokes about 3 cigarettes daily.  No alcohol abuse.  Reports ongoing left-sided abdominal pain over the past 3 weeks to months.  No vomiting no loose stools, has maintained good oral intake.  Denies weight loss.   Clinical Impression   Pt agreeable to OT and PT co-evaluation. Pt independent at baseline. Mild increase in L UE weakness noted. Deficits in balance and mobilizing L LE for ambulation. Possible L UE fine motor deficits but pt is limited in this area at baseline from a previous hand fracture. Pt was left in bed with call bell within reach. Pt is not recommended for further acute OT services and will be discharged to care of nursing staff for remaining length of stay.      Recommendations for follow up therapy are one component of a multi-disciplinary discharge planning process, led by the attending physician.  Recommendations may be updated based on patient status, additional functional criteria and insurance authorization.   Follow Up  Recommendations  Outpatient OT     Assistance Recommended at Discharge Set up Supervision/Assistance  Patient can return home with the following A little help with walking and/or transfers;Help with stairs or ramp for entrance;Assist for transportation    Functional Status Assessment  Patient has had a recent decline in their functional status and demonstrates the ability to make significant improvements in function in a reasonable and predictable amount of time.  Equipment Recommendations  None recommended by OT    Recommendations for Other Services       Precautions / Restrictions Precautions Precautions: None Restrictions Weight Bearing Restrictions: No      Mobility Bed Mobility Overal bed mobility: Independent                  Transfers Overall transfer level: Needs assistance   Transfers: Sit to/from Stand, Bed to chair/wheelchair/BSC Sit to Stand: Supervision, Min guard     Step pivot transfers: Supervision, Min guard     General transfer comment: Mild labored movement with difficulty mobilizing L LE.      Balance Overall balance assessment: Needs assistance Sitting-balance support: No upper extremity supported, Feet supported Sitting balance-Leahy Scale: Good Sitting balance - Comments: seated EOB   Standing balance support: No upper extremity supported, During functional activity Standing balance-Leahy Scale: Fair Standing balance comment: without AD                           ADL either performed or assessed with clinical judgement   ADL Overall ADL's : Needs assistance/impaired  Grooming: Supervision/safety;Min guard;Standing       Lower Body Bathing: Modified independent;Sitting/lateral leans       Lower Body Dressing: Modified independent;Sitting/lateral leans   Toilet Transfer: Supervision/safety;Min guard;Ambulation Toilet Transfer Details (indicate cue type and reason): Simulated via ambulation in hall and return  to EOB.         Functional mobility during ADLs: Supervision/safety;Min guard       Vision Baseline Vision/History: 4 Cataracts Ability to See in Adequate Light: 1 Impaired Patient Visual Report: No change from baseline Vision Assessment?: No apparent visual deficits                Pertinent Vitals/Pain Pain Assessment Pain Assessment: No/denies pain     Hand Dominance Right   Extremity/Trunk Assessment Upper Extremity Assessment Upper Extremity Assessment: LUE deficits/detail LUE Deficits / Details: 4+/5 grossly other than for wrist extension at 4-/5 and grip at 4-/5. Poor fine motor coordination but with confounding variable of history of hand fracture that limited fine motor function at baseline. LUE Sensation: decreased light touch LUE Coordination: decreased fine motor   Lower Extremity Assessment Lower Extremity Assessment: Defer to PT evaluation   Cervical / Trunk Assessment Cervical / Trunk Assessment: Normal   Communication Communication Communication: No difficulties   Cognition Arousal/Alertness: Awake/alert Behavior During Therapy: WFL for tasks assessed/performed Overall Cognitive Status: Within Functional Limits for tasks assessed                                                        Home Living Family/patient expects to be discharged to:: Private residence Living Arrangements: Non-relatives/Friends Available Help at Discharge: Other (Comment) (girlfriend; possibly 24/7 starting Friday with girlfriend not having to work.) Type of Home: House Home Access: Stairs to enter [employer] of Steps: 2 Entrance Stairs-Rails: None Home Layout: Two level;Able to live on main level with bedroom/bathroom     Bathroom Shower/Tub: Teacher, early years/pre: Standard Bathroom Accessibility: Yes How Accessible: Accessible via walker;Accessible via wheelchair Home Equipment: None          Prior  Functioning/Environment Prior Level of Function : Independent/Modified Independent             Mobility Comments: Community ambulator without AD; Pt has not driven in 3 months. ADLs Comments: Indepndent        OT Problem List: Decreased strength;Decreased activity tolerance;Impaired balance (sitting and/or standing)                      Co-evaluation PT/OT/SLP Co-Evaluation/Treatment: Yes Reason for Co-Treatment: To address functional/ADL transfers   OT goals addressed during session: ADL's and self-care                       End of Session Equipment Utilized During Treatment: Gait belt  Activity Tolerance: Patient tolerated treatment well Patient left: in bed;with call bell/phone within reach;with family/visitor present  OT Visit Diagnosis: Unsteadiness on feet (R26.81);Other abnormalities of gait and mobility (R26.89);Muscle weakness (generalized) (M62.81);Other symptoms and signs involving the nervous system (H41.740)                Time: 8144-8185 OT Time Calculation (min): 21 min Charges:  OT General Charges $OT Visit: 1 Visit OT Evaluation $OT Eval Low Complexity: 1 Low  [place] OT,  MOT   Larey Seat 01/19/2023, 9:31 AM

## 2023-01-19 NOTE — Discharge Instructions (Addendum)
1)Please take Aspirin 81 mg daily along with Plavix 75 mg daily for 21 days then after that STOP the Plavix  and continue ONLY Aspirin 81 mg daily indefinitely--for secondary stroke Prevention   2) complete abstinence from tobacco advised--May use over-the-counter nicotine patch to help you quit smoking  3)Avoid ibuprofen/Advil/Aleve/Motrin/Goody Powders/Naproxen/BC powders/Meloxicam/Diclofenac/Indomethacin and other Nonsteroidal anti-inflammatory medications as these will make you more likely to bleed and can cause stomach ulcers, can also cause Kidney problems.  4)Your have an enlarged lymph node in your neck--- radiologist recommends PET scan as an outpatient to make sure this is not related to cancer--- with your permission I spoke with your daughter Ms. Fransisca Connors who tells me that she will drive you to the oncology appointment to schedule the PET scan  5)Please follow-up with hematologist/oncologist Dr. Derek Jack, MD---in 2 to 3 weeks to discuss possibly scheduling PET scan to look at the enlarged lymph node in your neck -Address: inside [facility] (4th Floor), [place], [place], [place] [address] Phone: [phone]    TRANSPORTATION Agency Name: [facility] [place] ([facility]/RCATS/skat) Address: [address], [place], [place] [address] Phone: [phone] Website: www.adtsrc.org Services Offered: Meals on [employer]. Home care, at home assisted living,  [place] for [place], RCATS and SKAT transportation system. Agency Name: [facility] Address: [address], [place], [place] [address] Phone: [phone] Email: [email or web address] Website: [email or web address] Services Offered: Transportation for a fee

## 2023-01-19 NOTE — Progress Notes (Signed)
*  PRELIMINARY RESULTS* Echocardiogram 2D Echocardiogram has been performed.  Robert Lyons 01/19/2023, 10:19 AM

## 2023-01-20 ENCOUNTER — Encounter (HOSPITAL_COMMUNITY)
Admission: RE | Admit: 2023-01-20 | Discharge: 2023-01-20 | Disposition: A | Discharge status: Home / Self Care | Payer: Medicaid Other | Source: Ambulatory Visit | Attending: Ophthalmology | Admitting: Ophthalmology

## 2023-01-24 ENCOUNTER — Encounter (HOSPITAL_COMMUNITY): Payer: Self-pay | Admitting: *Deleted

## 2023-01-24 ENCOUNTER — Emergency Department (HOSPITAL_COMMUNITY)
Admission: EM | Admit: 2023-01-24 | Discharge: 2023-01-24 | Disposition: A | Discharge status: Home / Self Care | Payer: Medicaid Other | Attending: Emergency Medicine | Admitting: Emergency Medicine

## 2023-01-24 ENCOUNTER — Other Ambulatory Visit: Payer: Self-pay

## 2023-01-24 ENCOUNTER — Emergency Department (HOSPITAL_COMMUNITY): Payer: Medicaid Other

## 2023-01-24 DIAGNOSIS — K5900 Constipation, unspecified: Secondary | ICD-10-CM | POA: Insufficient documentation

## 2023-01-24 DIAGNOSIS — Z7982 Long term (current) use of aspirin: Secondary | ICD-10-CM | POA: Insufficient documentation

## 2023-01-24 DIAGNOSIS — E860 Dehydration: Secondary | ICD-10-CM | POA: Insufficient documentation

## 2023-01-24 DIAGNOSIS — Z7902 Long term (current) use of antithrombotics/antiplatelets: Secondary | ICD-10-CM | POA: Insufficient documentation

## 2023-01-24 DIAGNOSIS — R42 Dizziness and giddiness: Secondary | ICD-10-CM | POA: Insufficient documentation

## 2023-01-24 DIAGNOSIS — Z8673 Personal history of transient ischemic attack (TIA), and cerebral infarction without residual deficits: Secondary | ICD-10-CM | POA: Insufficient documentation

## 2023-01-24 LAB — CBC WITH DIFFERENTIAL/PLATELET
Abs Immature Granulocytes: 0.03 10*3/uL (ref 0.00–0.07)
Basophils Absolute: 0.1 10*3/uL (ref 0.0–0.1)
Basophils Relative: 1 %
Eosinophils Absolute: 0.1 10*3/uL (ref 0.0–0.5)
Eosinophils Relative: 2 %
HCT: 42.3 % (ref 39.0–52.0)
Hemoglobin: 14.6 g/dL (ref 13.0–17.0)
Immature Granulocytes: 0 %
Lymphocytes Relative: 30 %
Lymphs Abs: 2.4 10*3/uL (ref 0.7–4.0)
MCH: 30.8 pg (ref 26.0–34.0)
MCHC: 34.5 g/dL (ref 30.0–36.0)
MCV: 89.2 fL (ref 80.0–100.0)
Monocytes Absolute: 0.8 10*3/uL (ref 0.1–1.0)
Monocytes Relative: 10 %
Neutro Abs: 4.6 10*3/uL (ref 1.7–7.7)
Neutrophils Relative %: 57 %
Platelets: 270 10*3/uL (ref 150–400)
RBC: 4.74 MIL/uL (ref 4.22–5.81)
RDW: 11.8 % (ref 11.5–15.5)
WBC: 8 10*3/uL (ref 4.0–10.5)
nRBC: 0 % (ref 0.0–0.2)

## 2023-01-24 LAB — BASIC METABOLIC PANEL
Anion gap: 13 (ref 5–15)
BUN: 19 mg/dL (ref 8–23)
CO2: 22 mmol/L (ref 22–32)
Calcium: 10.5 mg/dL — ABNORMAL HIGH (ref 8.9–10.3)
Chloride: 103 mmol/L (ref 98–111)
Creatinine, Ser: 1.29 mg/dL — ABNORMAL HIGH (ref 0.61–1.24)
GFR, Estimated: >60 mL/min (ref >60)
Glucose, Bld: 166 mg/dL — ABNORMAL HIGH (ref 70–99)
Potassium: 4.2 mmol/L (ref 3.5–5.1)
Sodium: 138 mmol/L (ref 135–145)

## 2023-01-24 LAB — COLOGUARD

## 2023-01-24 MED ORDER — SODIUM CHLORIDE 0.9 % IV BOLUS
1000.0000 mL | Freq: Once | INTRAVENOUS | Status: AC
  Administered 2023-01-24: 1000 mL via INTRAVENOUS

## 2023-01-24 MED ORDER — MECLIZINE HCL 25 MG PO TABS: 25.0000 mg | ORAL_TABLET | Freq: Three times a day (TID) | ORAL | 0 refills | Status: DC | PRN

## 2023-01-24 NOTE — ED Provider Notes (Signed)
[facility] Provider Note   CSN: [id number] Arrival date & time: 01/24/23  3664     History {Add pertinent medical, surgical, social history, OB history to HPI:1} Chief Complaint  Patient presents with   Dizziness    Robert Lyons is a 62 y.o. male.  Patient has a history of a recent stroke.  Patient complains of dizziness since his stroke and also constipation.   Dizziness      Home Medications Prior to Admission medications   Medication Sig Start Date End Date Taking? Authorizing Provider  acetaminophen (TYLENOL) 500 MG tablet Take 1,000 mg by mouth every 6 (six) hours as needed.   Yes [provider]  amLODipine (NORVASC) 5 MG tablet Take 1 tablet (5 mg total) by mouth daily. 01/19/23 04/19/23 Yes Roxan Hockey, MD  aspirin EC 81 MG tablet Take 1 tablet (81 mg total) by mouth daily with breakfast. Please take Aspirin 81 mg daily along with Plavix 75 mg daily for 21 days then after that STOP the Plavix  and continue ONLY Aspirin 81 mg daily indefinitely--for secondary stroke Prevention 01/20/23  Yes Emokpae, Courage, MD  atorvastatin (LIPITOR) 80 MG tablet Take 1 tablet (80 mg total) by mouth daily. 01/19/23 01/19/24 Yes Emokpae, Courage, MD  clopidogrel (PLAVIX) 75 MG tablet Take 1 tablet (75 mg total) by mouth daily. Please take Aspirin 81 mg daily along with Plavix 75 mg daily for 21 days then after that STOP the Plavix  and continue ONLY Aspirin 81 mg daily indefinitely--for secondary stroke Prevention 01/20/23  Yes Emokpae, Courage, MD  lisinopril (ZESTRIL) 40 MG tablet Take 1 tablet (40 mg total) by mouth daily. 01/19/23  Yes Roxan Hockey, MD  meclizine (ANTIVERT) 25 MG tablet Take 1 tablet (25 mg total) by mouth 3 (three) times daily as needed for dizziness. 01/24/23  Yes Milton Ferguson, MD  metFORMIN (GLUCOPHAGE) 850 MG tablet Take 1 tablet (850 mg total) by mouth 2 (two) times daily with a meal. 01/19/23  Yes  Emokpae, Courage, MD  metoprolol tartrate (LOPRESSOR) 50 MG tablet Take 1 tablet (50 mg total) by mouth 2 (two) times daily. 01/19/23  Yes Emokpae, Courage, MD  pantoprazole (PROTONIX) 40 MG tablet Take 1 tablet (40 mg total) by mouth daily. 01/19/23 04/19/23 Yes Emokpae, Courage, MD  Vitamin D, Ergocalciferol, (DRISDOL) 1.25 MG (50000 UNIT) CAPS capsule Take 1 capsule (50,000 Units total) by mouth every 7 (seven) days for 12 doses. 01/05/23 03/24/23 Yes [name], MD  Blood Glucose Monitoring Suppl [facility] ULTRALINK) w/Device KIT 1 each by Does not apply route as needed. 12/29/22   [name], MD  glucose blood [facility] ULTRA) test strip Use as instructed 12/29/22   [name], MD  Lancets [facility] ULTRASOFT) lancets Use as instructed once daily dx e11.9 12/29/22   [name], MD  moxifloxacin (VIGAMOX) 0.5 % ophthalmic solution Apply to eye. Patient not taking: Reported on 01/24/2023 12/30/22   [provider]  prednisoLONE acetate (PRED FORTE) 1 % ophthalmic suspension SMARTSIG:In Eye(s) Patient not taking: Reported on 01/24/2023 12/30/22   [provider]  Semaglutide,0.25 or 0.5MG /DOS, (OZEMPIC, 0.25 OR 0.5 MG/DOSE,) 2 MG/3ML SOPN Inject 0.25 mg into the skin every Sunday. Patient not taking: Reported on 01/24/2023 01/22/23   Roxan Hockey, MD      Allergies    Patient has no known allergies.    Review of Systems   Review of Systems  Neurological:  Positive for  dizziness.    Physical Exam Updated Vital Signs BP 132/78   Pulse 100   Temp (!) 97.5 F (36.4 C) (Oral)   Resp 19   Ht 5\' 11"  (1.803 m)   Wt 83.9 kg   SpO2 97%   BMI 25.80 kg/m  Physical Exam  ED Results / Procedures / Treatments   Labs (all labs ordered are listed, but only abnormal results are displayed) Labs Reviewed  BASIC METABOLIC PANEL - Abnormal; Notable for the following components:      Result Value   Glucose, Bld 166 (*)    Creatinine, Ser 1.29 (*)    Calcium 10.5 (*)     All other components within normal limits  CBC WITH DIFFERENTIAL/PLATELET    EKG None  Radiology DG Abdomen 1 View  Result Date: 01/24/2023 CLINICAL DATA:  Constipation EXAM: ABDOMEN - 1 VIEW COMPARISON:  None Available. FINDINGS: The bowel gas pattern is normal. There is a large amount of stool throughout the entire colon. No radio-opaque calculi or other significant radiographic abnormality are seen. IMPRESSION: Large amount of stool throughout the entire colon. Electronically Signed   By: Ronney Asters M.D.   On: 01/24/2023 22:19    Procedures Procedures  {Document cardiac monitor, telemetry assessment procedure when appropriate:1}  Medications Ordered in ED Medications  sodium chloride 0.9 % bolus 1,000 mL (1,000 mLs Intravenous New Bag/Given 01/24/23 2225)    ED Course/ Medical Decision Making/ A&P   {   Click here for ABCD2, HEART and other calculatorsREFRESH Note before signing :1}                          Medical Decision Making Amount and/or Complexity of Data Reviewed Labs: ordered. Radiology: ordered.   Patient with dizziness with dehydration and constipation.  Patient improved some with normal saline.  He is going to get a magnesium citrate bottle and drink that for his constipation.  He is going to stay hydrated and is given some Antivert also for the dizziness and will follow-up with PCP  {Document critical care time when appropriate:1} {Document review of labs and clinical decision tools ie heart score, Chads2Vasc2 etc:1}  {Document your independent review of radiology images, and any outside records:1} {Document your discussion with family members, caretakers, and with consultants:1} {Document social determinants of health affecting pt's care:1} {Document your decision making why or why not admission, treatments were needed:1} Final Clinical Impression(s) / ED Diagnoses Final diagnoses:  Dizziness  Dehydration  Constipation, unspecified constipation type     Rx / DC Orders ED Discharge Orders          Ordered    meclizine (ANTIVERT) 25 MG tablet  3 times daily PRN        01/24/23 2238

## 2023-01-24 NOTE — ED Notes (Signed)
Patient transported to X-ray 

## 2023-01-24 NOTE — Discharge Instructions (Signed)
Drink plenty of fluids.  Get 1 bottle of magnesium citrate from the drugstore.  Drink one half of this bottle and wait 2 to 3 hours.  If you do not have a bowel movement then go ahead and drink the other half.  Follow-up with your doctors as planned.  Stay hydrated.  You have been given some medicine to help with dizziness

## 2023-01-24 NOTE — ED Triage Notes (Signed)
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  States dizziness gradually getting worse, has not called his MD concerning this. Also c/o constipation.  LBM 4-5 days ago.

## 2023-01-26 ENCOUNTER — Telehealth: Payer: Self-pay | Admitting: *Deleted

## 2023-01-26 DIAGNOSIS — Z419 Encounter for procedure for purposes other than remedying health state, unspecified: Secondary | ICD-10-CM | POA: Diagnosis not present

## 2023-01-26 NOTE — Patient Outreach (Signed)
  Care Coordination [facility] Note Transition Care Management Follow-up Telephone Call Date of discharge and from where: 01/24/23 from [facility] ED How have you been since you were released from the hospital? "I'm doing ok" Any questions or concerns? No  Items Reviewed: Did the pt receive and understand the discharge instructions provided? Yes  Medications obtained and verified? No Patient declined medication review Other? No  Any new allergies since your discharge? No  Dietary orders reviewed? Yes Do you have support at home? Yes   Home Care and Equipment/Supplies: Were home health services ordered? no If so, what is the name of the agency? N/A  Has the agency set up a time to come to the patient's home? not applicable Were any new equipment or medical supplies ordered?  No What is the name of the medical supply agency? N/A Were you able to get the supplies/equipment? not applicable Do you have any questions related to the use of the equipment or supplies? No  Functional Questionnaire: (I = Independent and D = Dependent) ADLs: I  Bathing/Dressing- I  Meal Prep- I  Eating- I  Maintaining continence- I  Transferring/Ambulation- I  Managing Meds- I  Follow up appointments reviewed:  PCP Hospital f/u appt confirmed? Yes  Scheduled to see PCP on 02/02/23. [facility] f/u appt confirmed? Yes  Scheduled to see Oncology on 01/31/21. Are transportation arrangements needed? No  If their condition worsens, is the pt aware to call PCP or go to the Emergency Dept.? Yes Was the patient provided with contact information for the PCP's office or ED? No Was to pt encouraged to call back with questions or concerns? Yes  SDOH assessments and interventions completed:   Yes   RNCM provided patient with information on Case Management with [facility] Team. Patient declined CM services at this time.  Lurena Joiner RN, BSN   [employer] manager

## 2023-01-27 ENCOUNTER — Ambulatory Visit: Admit: 2023-01-27 | Payer: Medicaid Other | Admitting: Ophthalmology

## 2023-01-27 SURGERY — PHACOEMULSIFICATION, CATARACT, WITH IOL INSERTION
Anesthesia: Monitor Anesthesia Care | Laterality: Right

## 2023-01-31 ENCOUNTER — Encounter (HOSPITAL_COMMUNITY): Payer: Medicaid Other | Admitting: Occupational Therapy

## 2023-01-31 ENCOUNTER — Encounter: Payer: Self-pay | Admitting: Hematology

## 2023-01-31 ENCOUNTER — Inpatient Hospital Stay: Payer: Medicaid Other | Attending: Hematology | Admitting: Hematology

## 2023-01-31 VITALS — BP 132/77 | HR 108 | Temp 97.8°F | Resp 18 | Ht 71.0 in | Wt 190.3 lb

## 2023-01-31 DIAGNOSIS — R590 Localized enlarged lymph nodes: Secondary | ICD-10-CM | POA: Insufficient documentation

## 2023-01-31 DIAGNOSIS — Z8673 Personal history of transient ischemic attack (TIA), and cerebral infarction without residual deficits: Secondary | ICD-10-CM | POA: Diagnosis not present

## 2023-01-31 DIAGNOSIS — R634 Abnormal weight loss: Secondary | ICD-10-CM | POA: Insufficient documentation

## 2023-01-31 DIAGNOSIS — E119 Type 2 diabetes mellitus without complications: Secondary | ICD-10-CM | POA: Insufficient documentation

## 2023-01-31 DIAGNOSIS — I10 Essential (primary) hypertension: Secondary | ICD-10-CM | POA: Diagnosis not present

## 2023-01-31 NOTE — Progress Notes (Signed)
[facility] NOTE  Patient Care Team: [name], MD as PCP - General (Internal Medicine) Satira Sark, MD as PCP - Cardiology (Cardiology)  CHIEF COMPLAINTS/PURPOSE OF CONSULTATION:  Right paratracheal lymphadenopathy  HISTORY OF PRESENTING ILLNESS:  Robert Lyons 62 y.o. male is seen in consultation today at the request of ER for right paratracheal adenopathy.  He was recently hospitalized for CVA on 01/19/2023.  As he complained of chest pain, CT CAP was done which showed partially visualized 1.6 x 1.3 cm ovoid soft tissue nodule to the right of the trachea concerning for enlarged lymph node.  This was followed with a CT soft tissue neck which showed 3.1 x 1.1 x 1.3 cm right paratracheal lymph node immediately posterior to the thyroid lobe.  Small paratracheal nodes are noted more inferiorly, closest node measuring 9 mm.  No enlarged cervical chain lymph nodes.  No clear head and neck primary seen.  He reports 7 pound weight loss in the last 1 month since his CVA.  He had some weakness on the left side and will start physical therapy on 02/10/2023.  He had decreased vision due to bilateral cataracts.  He lives at his home with his wife.  He reports some slight dysphagia and decreased appetite after the stroke.  Prior to the stroke, he did not have any dysphagia.  MEDICAL HISTORY:  Past Medical History:  Diagnosis Date   GERD (gastroesophageal reflux disease)    Hypertension    Pneumonia    Type 2 diabetes mellitus ([place])     SURGICAL HISTORY: Past Surgical History:  Procedure Laterality Date   No prior surgery      SOCIAL HISTORY: Social History   Socioeconomic History   Marital status: Single    Spouse name: Not on file   Number of children: Not on file   Years of education: Not on file   Highest education level: Not on file  Occupational History   Not on file  Tobacco Use   Smoking status: Former    Packs/day: 1.00    Years: 15.00     Total pack years: 15.00    Types: Cigarettes    Quit date: 01/17/2023    Years since quitting: 0.0   Smokeless tobacco: Never  Vaping Use   Vaping Use: Never used  Substance and Sexual Activity   Alcohol use: No    Comment: none since 2014   Drug use: No   Sexual activity: Not on file  Other Topics Concern   Not on file  Social History Narrative   Not on file   Social Determinants of Health   Financial Resource Strain: Not on file  Food Insecurity: No Food Insecurity (01/31/2023)   Hunger Vital Sign    Worried About Running Out of Food in the Last Year: Never true    Ran Out of Food in the Last Year: Never true  Transportation Needs: Unmet Transportation Needs (01/31/2023)   PRAPARE - Hydrologist (Medical): Yes    Lack of Transportation (Non-Medical): No  Physical Activity: Not on file  Stress: Not on file  Social Connections: Not on file  Intimate Partner Violence: Not At Risk (01/31/2023)   Humiliation, Afraid, Rape, and Kick questionnaire    Fear of Current or Ex-Partner: No    Emotionally Abused: No    Physically Abused: No    Sexually Abused: No    FAMILY HISTORY: Family History  Problem Relation  Age of Onset   Diabetes Mother    Hypertension Mother    Diabetes Maternal Aunt    Diabetes Maternal Uncle     ALLERGIES:  has No Known Allergies.  MEDICATIONS:  Current Outpatient Medications  Medication Sig Dispense Refill   acetaminophen (TYLENOL) 500 MG tablet Take 1,000 mg by mouth every 6 (six) hours as needed.     amLODipine (NORVASC) 5 MG tablet Take 1 tablet (5 mg total) by mouth daily. 30 tablet 9   aspirin EC 81 MG tablet Take 1 tablet (81 mg total) by mouth daily with breakfast. Please take Aspirin 81 mg daily along with Plavix 75 mg daily for 21 days then after that STOP the Plavix  and continue ONLY Aspirin 81 mg daily indefinitely--for secondary stroke Prevention 30 tablet 11   atorvastatin (LIPITOR) 80 MG tablet Take 1 tablet  (80 mg total) by mouth daily. 30 tablet 11   Blood Glucose Monitoring Suppl (ONETOUCH ULTRALINK) w/Device KIT 1 each by Does not apply route as needed. 1 kit 0   clopidogrel (PLAVIX) 75 MG tablet Take 1 tablet (75 mg total) by mouth daily. Please take Aspirin 81 mg daily along with Plavix 75 mg daily for 21 days then after that STOP the Plavix  and continue ONLY Aspirin 81 mg daily indefinitely--for secondary stroke Prevention 30 tablet 11   glucose blood (ONETOUCH ULTRA) test strip Use as instructed 100 each 12   Lancets (ONETOUCH ULTRASOFT) lancets Use as instructed once daily dx e11.9 100 each 12   lisinopril (ZESTRIL) 40 MG tablet Take 1 tablet (40 mg total) by mouth daily. 90 tablet 3   meclizine (ANTIVERT) 25 MG tablet Take 1 tablet (25 mg total) by mouth 3 (three) times daily as needed for dizziness. 12 tablet 0   metFORMIN (GLUCOPHAGE) 850 MG tablet Take 1 tablet (850 mg total) by mouth 2 (two) times daily with a meal. 180 tablet 4   metoprolol tartrate (LOPRESSOR) 50 MG tablet Take 1 tablet (50 mg total) by mouth 2 (two) times daily. 180 tablet 4   moxifloxacin (VIGAMOX) 0.5 % ophthalmic solution Apply to eye.     pantoprazole (PROTONIX) 40 MG tablet Take 1 tablet (40 mg total) by mouth daily. 30 tablet 11   prednisoLONE acetate (PRED FORTE) 1 % ophthalmic suspension      Semaglutide,0.25 or 0.5MG /DOS, (OZEMPIC, 0.25 OR 0.5 MG/DOSE,) 2 MG/3ML SOPN Inject 0.25 mg into the skin every Sunday. 3 mL 3   Vitamin D, Ergocalciferol, (DRISDOL) 1.25 MG (50000 UNIT) CAPS capsule Take 1 capsule (50,000 Units total) by mouth every 7 (seven) days for 12 doses. 12 capsule 0   No current facility-administered medications for this visit.    REVIEW OF SYSTEMS:   Constitutional: Denies fevers, chills or abnormal night sweats Eyes: Denies blurriness of vision, double vision or watery eyes Ears, nose, mouth, throat, and face: Denies mucositis or sore throat Respiratory: Positive for shortness of  breath. Cardiovascular: Denies palpitation, chest discomfort or lower extremity swelling Gastrointestinal:  Denies nausea, heartburn or change in bowel habits Skin: Denies abnormal skin rashes Lymphatics: Denies new lymphadenopathy or easy bruising Neurological: Positive for numbness on the left side of the body since CVA. Behavioral/Psych: Mood is stable, no new changes  All other systems were reviewed with the patient and are negative.  PHYSICAL EXAMINATION: ECOG PERFORMANCE STATUS: 1 - Symptomatic but completely ambulatory  Vitals:   01/31/23 0818  BP: 132/77  Pulse: (!) 108  Resp: 18  Temp:  97.8 F (36.6 C)  SpO2: 99%   Filed Weights   01/31/23 0818  Weight: 190 lb 4.8 oz (86.3 kg)    GENERAL:alert, no distress and comfortable SKIN: skin color, texture, turgor are normal, no rashes or significant lesions EYES: normal, conjunctiva are pink and non-injected, sclera clear OROPHARYNX:no exudate, no erythema and lips, buccal mucosa, and tongue normal  NECK: supple, thyroid normal size, non-tender, without nodularity LYMPH:  no palpable lymphadenopathy in the cervical, axillary or inguinal LUNGS: clear to auscultation and percussion with normal breathing effort HEART: regular rate & rhythm and no murmurs and no lower extremity edema ABDOMEN:abdomen soft, non-tender and normal bowel sounds Musculoskeletal:no cyanosis of digits and no clubbing  PSYCH: alert & oriented x 3 with fluent speech NEURO: no focal motor/sensory deficits  LABORATORY DATA:  I have reviewed the data as listed Lab Results  Component Value Date   WBC 8.0 01/24/2023   HGB 14.6 01/24/2023   HCT 42.3 01/24/2023   MCV 89.2 01/24/2023   PLT 270 01/24/2023     Chemistry      Component Value Date/Time   NA 138 01/24/2023 2020   NA 138 12/02/2022 1538   K 4.2 01/24/2023 2020   CL 103 01/24/2023 2020   CO2 22 01/24/2023 2020   BUN 19 01/24/2023 2020   BUN 12 12/02/2022 1538   CREATININE 1.29 (H)  01/24/2023 2020      Component Value Date/Time   CALCIUM 10.5 (H) 01/24/2023 2020   ALKPHOS 58 01/18/2023 1402   AST 28 01/18/2023 1402   ALT 19 01/18/2023 1402   BILITOT 1.0 01/18/2023 1402   BILITOT 0.6 12/02/2022 1538       RADIOGRAPHIC STUDIES: I have personally reviewed the radiological images as listed and agreed with the findings in the report. DG Abdomen 1 View  Result Date: 01/24/2023 CLINICAL DATA:  Constipation EXAM: ABDOMEN - 1 VIEW COMPARISON:  None Available. FINDINGS: The bowel gas pattern is normal. There is a large amount of stool throughout the entire colon. No radio-opaque calculi or other significant radiographic abnormality are seen. IMPRESSION: Large amount of stool throughout the entire colon. Electronically Signed   By: Darliss Cheney M.D.   On: 01/24/2023 22:19   US Carotid Bilateral (at [facility] and [facility] only)  Result Date: 01/20/2023 CLINICAL DATA:  Left-sided weakness.  Acute infarct in the pons. EXAM: BILATERAL CAROTID DUPLEX ULTRASOUND TECHNIQUE: Wallace Cullens scale imaging, color Doppler and duplex ultrasound were performed of bilateral carotid and vertebral arteries in the neck. COMPARISON:  CT neck 01/19/2023 FINDINGS: Criteria: Quantification of carotid stenosis is based on velocity parameters that correlate the residual internal carotid diameter with NASCET-based stenosis levels, using the diameter of the distal internal carotid lumen as the denominator for stenosis measurement. The following velocity measurements were obtained: RIGHT ICA: 125/38 cm/sec CCA: 75/17 cm/sec SYSTOLIC ICA/CCA RATIO:  2.0 ECA: 186 cm/sec LEFT ICA: 75/25 cm/sec CCA: 109/28 cm/sec SYSTOLIC ICA/CCA RATIO:  0.9 ECA: 80 cm/sec RIGHT CAROTID ARTERY: Heterogeneous plaque in the distal common carotid artery and carotid bulb. External carotid artery is patent with normal waveform. Small amount of echogenic plaque in the proximal internal carotid artery. Normal waveforms and velocities in the internal carotid  artery. RIGHT VERTEBRAL ARTERY: Antegrade flow and normal waveform in the right vertebral artery. LEFT CAROTID ARTERY: Heterogeneous plaque in the left common carotid artery. Small amount of plaque at the left carotid bulb. External carotid artery is patent with normal waveform. Small amount of heterogeneous plaque in  the left internal carotid artery. Normal waveforms and velocities in the internal carotid artery. LEFT VERTEBRAL ARTERY: Antegrade flow and normal waveform in the left vertebral artery. IMPRESSION: 1. Atherosclerotic plaque involving bilateral carotid arteries. Estimated degree of stenosis in the internal carotid arteries is less than 50% bilaterally. 2. Patent vertebral arteries with antegrade flow. Electronically Signed   By: Richarda Overlie M.D.   On: 01/20/2023 09:07   CT SOFT TISSUE NECK W CONTRAST  Result Date: 01/19/2023 CLINICAL DATA:  Abnormality on CT angio chest. Partially visualized nodule adjacent to the trachea. EXAM: CT NECK WITH CONTRAST TECHNIQUE: Multidetector CT imaging of the neck was performed using the standard protocol following the bolus administration of intravenous contrast. RADIATION DOSE REDUCTION: This exam was performed according to the departmental dose-optimization program which includes automated exposure control, adjustment of the mA and/or kV according to patient size and/or use of iterative reconstruction technique. CONTRAST:  57mL OMNIPAQUE IOHEXOL 300 MG/ML  SOLN COMPARISON:  CT angio chest 01/19/2023 FINDINGS: Pharynx and larynx: No focal mucosal or submucosal lesions are present. Nasopharynx is clear. Soft palate and tongue base are within normal limits. Vallecula and epiglottis are within normal limits. Aryepiglottic folds and piriform sinuses are clear. Vocal cords are midline and symmetric. Trachea is clear. Salivary glands: 11 mm ovoid hyperdensity in the posterior aspect of the left parotid gland likely represents an intraparotid lymph node. No other focal  other smaller intraparotid lymph nodes are present bilaterally. Submandibular and parotid glands are otherwise within normal limits. Thyroid: Normal Lymph nodes: Lesion identified at the CTA chest measures 3.1 x 1.1 x 1.3 cm. A 9 mm right paratracheal lymph node is present slightly more inferiorly. No enlarged cervical chain nodes are present. Vascular: Atherosclerotic calcifications are present at the carotid bifurcations bilaterally without significant stenosis. Some calcifications are also present along the undersurface of the aortic arch. Great vessel origins are unremarkable. Limited intracranial: Within normal limits. Visualized orbits: The globes and orbits are within normal limits. Mastoids and visualized paranasal sinuses: The paranasal sinuses and mastoid air cells are clear. Skeleton: Vertebral body heights and alignment are normal. No acute or focal osseous lesions are present. Upper chest: The lung apices are clear. Small paratracheal nodes are again noted. IMPRESSION: 1. Lesion identified at the CTA chest likely represents a right paratracheal lymph node measuring 3.1 x 1.1 x 1.3 cm. Lesion is immediately posterior to the thyroid lobe be very atypical to be a pedunculated thyroid lesion and with a normal calcium level, parathyroid adenoma is also very unlikely. Given the size of this node, it is concerning for malignancy. No primary lesions are evident in the neck. Recommend PET scan for further workup of occult malignancy. 2. Smaller paratracheal nodes are noted more inferiorly, the closest node measures 9 mm. 3. No enlarged cervical chain nodes. 4.  Aortic Atherosclerosis (ICD10-I70.0). Electronically Signed   By: Marin Roberts M.D.   On: 01/19/2023 16:52   ECHOCARDIOGRAM COMPLETE  Result Date: 01/19/2023    ECHOCARDIOGRAM REPORT   Patient Name:   YOSKAR MURRILLO Date of Exam: 01/19/2023 Medical Rec #:  [id number]         Height:       71.0 in Accession #:    [id number]        Weight:        192.0 lb Date of Birth:  08-28-1961        BSA:          2.072 m Patient Age:  61 years          BP:           150/80 mmHg Patient Gender: M                 HR:           75 bpm. [facility] Procedure: 2D Echo, Cardiac Doppler and Color Doppler Indications:    Stroke, Chest Pain  History:        Patient has prior history of Echocardiogram examinations, most                 recent 02/12/2021. Stroke, Signs/Symptoms:Chest Pain; Risk                 Factors:Hypertension, Diabetes, Dyslipidemia and Current Smoker.  Sonographer:    Mikki Harbororothy Buchanan Referring Phys: [phone] Heloise BeechamEJIROGHENE E EMOKPAE IMPRESSIONS  1. Left ventricular ejection fraction, by estimation, is 55 to 60%. The left ventricle has normal function. The left ventricle has no regional wall motion abnormalities. There is moderate concentric left ventricular hypertrophy. Left ventricular diastolic parameters are consistent with Grade I diastolic dysfunction (impaired relaxation).  2. Right ventricular systolic function is normal. The right ventricular size is normal. Tricuspid regurgitation signal is inadequate for assessing PA pressure.  3. The mitral valve is grossly normal. Trivial mitral valve regurgitation.  4. The aortic valve is tricuspid. Aortic valve regurgitation is not visualized.  5. The inferior vena cava is normal in size with greater than 50% respiratory variability, suggesting right atrial pressure of 3 mmHg. Comparison(s): No significant change from prior study. Prior images reviewed side by side. FINDINGS  Left Ventricle: Left ventricular ejection fraction, by estimation, is 55 to 60%. The left ventricle has normal function. The left ventricle has no regional wall motion abnormalities. The left ventricular internal cavity size was normal in size. There is  moderate concentric left ventricular hypertrophy. Left ventricular diastolic parameters are consistent with Grade I diastolic dysfunction (impaired relaxation). Right  Ventricle: The right ventricular size is normal. No increase in right ventricular wall thickness. Right ventricular systolic function is normal. Tricuspid regurgitation signal is inadequate for assessing PA pressure. Left Atrium: Left atrial size was normal in size. Right Atrium: Right atrial size was normal in size. Pericardium: There is no evidence of pericardial effusion. Mitral Valve: The mitral valve is grossly normal. Trivial mitral valve regurgitation. MV peak gradient, 1.5 mmHg. The mean mitral valve gradient is 0.0 mmHg. Tricuspid Valve: The tricuspid valve is grossly normal. Tricuspid valve regurgitation is trivial. Aortic Valve: The aortic valve is tricuspid. Aortic valve regurgitation is not visualized. Aortic valve mean gradient measures 2.0 mmHg. Aortic valve peak gradient measures 4.6 mmHg. Aortic valve area, by VTI measures 2.95 cm. Pulmonic Valve: The pulmonic valve was grossly normal. Pulmonic valve regurgitation is trivial. Aorta: The aortic root is normal in size and structure. Venous: The inferior vena cava is normal in size with greater than 50% respiratory variability, suggesting right atrial pressure of 3 mmHg. IAS/Shunts: No atrial level shunt detected by color flow Doppler.  LEFT VENTRICLE PLAX 2D LVIDd:         4.10 cm   Diastology LVIDs:         3.00 cm   LV e' medial:    6.42 cm/s LV PW:         1.50 cm   LV E/e' medial:  7.7 LV IVS:        1.40 cm   LV  e' lateral:   9.25 cm/s LVOT diam:     2.10 cm   LV E/e' lateral: 5.3 LV SV:         72 LV SV Index:   35 LVOT Area:     3.46 cm  RIGHT VENTRICLE RV Basal diam:  3.85 cm RV Mid diam:    3.50 cm RV S prime:     8.59 cm/s TAPSE (M-mode): 2.6 cm LEFT ATRIUM             Index        RIGHT ATRIUM           Index LA diam:        3.90 cm 1.88 cm/m   RA Area:     19.60 cm LA Vol (A2C):   57.9 ml 27.94 ml/m  RA Volume:   61.30 ml  29.58 ml/m LA Vol (A4C):   53.9 ml 26.01 ml/m LA Biplane Vol: 60.7 ml 29.29 ml/m  AORTIC VALVE                     PULMONIC VALVE AV Area (Vmax):    2.92 cm     PV Vmax:       0.66 m/s AV Area (Vmean):   2.56 cm     PV Peak grad:  1.7 mmHg AV Area (VTI):     2.95 cm AV Vmax:           107.00 cm/s AV Vmean:          71.800 cm/s AV VTI:            0.244 m AV Peak Grad:      4.6 mmHg AV Mean Grad:      2.0 mmHg LVOT Vmax:         90.30 cm/s LVOT Vmean:        53.000 cm/s LVOT VTI:          0.208 m LVOT/AV VTI ratio: 0.85  AORTA Ao Root diam: 3.80 cm MITRAL VALVE MV Area (PHT): 3.70 cm    SHUNTS MV Area VTI:   4.34 cm    Systemic VTI:  0.21 m MV Peak grad:  1.5 mmHg    Systemic Diam: 2.10 cm MV Mean grad:  0.0 mmHg MV Vmax:       0.62 m/s MV Vmean:      31.8 cm/s MV Decel Time: 205 msec MV E velocity: 49.20 cm/s MV A velocity: 61.90 cm/s MV E/A ratio:  0.79 Nona DellSamuel Mcdowell MD Electronically signed by Nona DellSamuel Mcdowell MD Signature Date/Time: 01/19/2023/11:42:05 AM    Final    CT Angio Chest Pulmonary Embolism (PE) W or WO Contrast  Result Date: 01/19/2023 CLINICAL DATA:  Chest pain and shortness of breath. Left-sided abdominal pain. EXAM: CT ANGIOGRAPHY CHEST CT ABDOMEN AND PELVIS WITH CONTRAST TECHNIQUE: Multidetector CT imaging of the chest was performed using the standard protocol during bolus administration of intravenous contrast. Multiplanar CT image reconstructions and MIPs were obtained to evaluate the vascular anatomy. Multidetector CT imaging of the abdomen and pelvis was performed using the standard protocol during bolus administration of intravenous contrast. RADIATION DOSE REDUCTION: This exam was performed according to the departmental dose-optimization program which includes automated exposure control, adjustment of the mA and/or kV according to patient size and/or use of iterative reconstruction technique. CONTRAST:  100mL OMNIPAQUE IOHEXOL 350 MG/ML SOLN COMPARISON:  CT abdomen pelvis dated 09/28/2021. FINDINGS: CTA CHEST FINDINGS Cardiovascular: There is no cardiomegaly  or pericardial effusion. Mild  atherosclerotic calcification of the aortic arch. No aneurysmal dilatation or dissection. The origins of the great vessels of the aortic arch appear patent. No pulmonary artery embolus identified. Mediastinum/Nodes: There is no hilar or mediastinal adenopathy. The esophagus is grossly unremarkable. A partially visualized 1.6 x 1.3 cm ovoid soft tissue nodule to the right of trachea (5/4) concerning for an enlarged lymph node. Further evaluation with neck CT with IV contrast recommended. No mediastinal fluid collection. Lungs/Pleura: No focal consolidation, pleural effusion, or pneumothorax. The central airways are patent. Musculoskeletal: Degenerative changes of the spine. No acute osseous pathology. Review of the MIP images confirms the above findings. CT ABDOMEN and PELVIS FINDINGS No intra-abdominal free air or free fluid. Hepatobiliary: Subcentimeter hepatic hypodense focus is too small to characterize. The liver is otherwise unremarkable. No biliary ductal dilatation. The gallbladder is unremarkable. Pancreas: Unremarkable. No pancreatic ductal dilatation or surrounding inflammatory changes. Spleen: Normal in size without focal abnormality. Adrenals/Urinary Tract: The adrenal glands are unremarkable. Small bilateral renal cysts. No imaging follow-up. There is no hydronephrosis on either side. There is symmetric enhancement and excretion of contrast by both kidneys. The visualized ureters and urinary bladder appear unremarkable. Stomach/Bowel: There is moderate stool throughout the colon. Scattered distal colonic diverticula without active inflammatory changes. There is no bowel obstruction active inflammation. The appendix is normal. Vascular/Lymphatic: Mild aortoiliac atherosclerotic disease. 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 The iliac arteries remain patent. The IVC is unremarkable. No portal venous gas. There is no adenopathy. Reproductive: The prostate and  seminal vesicles are grossly unremarkable. No pelvic mass. Other: None Musculoskeletal: Mild degenerative changes. No acute osseous pathology. Review of the MIP images confirms the above findings. IMPRESSION: 1. No acute intrathoracic, abdominal, or pelvic pathology. No pulmonary artery embolus identified. 2. Mild colonic diverticulosis. No bowel obstruction. Normal appendix. 3. Partially visualized 1.6 x 1.3 cm ovoid soft tissue nodule to the right of the trachea concerning for an enlarged lymph node. Further evaluation with neck CT with IV contrast recommended. 4.  Aortic Atherosclerosis (ICD10-I70.0). Electronically Signed   By: Anner Crete M.D.   On: 01/19/2023 00:42   CT ABDOMEN PELVIS W CONTRAST  Result Date: 01/19/2023 CLINICAL DATA:  Chest pain and shortness of breath. Left-sided abdominal pain. EXAM: CT ANGIOGRAPHY CHEST CT ABDOMEN AND PELVIS WITH CONTRAST TECHNIQUE: Multidetector CT imaging of the chest was performed using the standard protocol during bolus administration of intravenous contrast. Multiplanar CT image reconstructions and MIPs were obtained to evaluate the vascular anatomy. Multidetector CT imaging of the abdomen and pelvis was performed using the standard protocol during bolus administration of intravenous contrast. RADIATION DOSE REDUCTION: This exam was performed according to the departmental dose-optimization program which includes automated exposure control, adjustment of the mA and/or kV according to patient size and/or use of iterative reconstruction technique. CONTRAST:  154mL OMNIPAQUE IOHEXOL 350 MG/ML SOLN COMPARISON:  CT abdomen pelvis dated 09/28/2021. FINDINGS: CTA CHEST FINDINGS Cardiovascular: There is no cardiomegaly or pericardial effusion. Mild atherosclerotic calcification of the aortic arch. No aneurysmal dilatation or dissection. The origins of the great vessels of the aortic arch appear patent. No pulmonary artery embolus identified. Mediastinum/Nodes: There  is no hilar or mediastinal adenopathy. The esophagus is grossly unremarkable. A partially visualized 1.6 x 1.3 cm ovoid soft tissue nodule to the right of trachea (5/4) concerning for an enlarged lymph node. Further evaluation with neck CT with IV contrast recommended. No mediastinal fluid collection. Lungs/Pleura:  No focal consolidation, pleural effusion, or pneumothorax. The central airways are patent. Musculoskeletal: Degenerative changes of the spine. No acute osseous pathology. Review of the MIP images confirms the above findings. CT ABDOMEN and PELVIS FINDINGS No intra-abdominal free air or free fluid. Hepatobiliary: Subcentimeter hepatic hypodense focus is too small to characterize. The liver is otherwise unremarkable. No biliary ductal dilatation. The gallbladder is unremarkable. Pancreas: Unremarkable. No pancreatic ductal dilatation or surrounding inflammatory changes. Spleen: Normal in size without focal abnormality. Adrenals/Urinary Tract: The adrenal glands are unremarkable. Small bilateral renal cysts. No imaging follow-up. There is no hydronephrosis on either side. There is symmetric enhancement and excretion of contrast by both kidneys. The visualized ureters and urinary bladder appear unremarkable. Stomach/Bowel: There is moderate stool throughout the colon. Scattered distal colonic diverticula without active inflammatory changes. There is no bowel obstruction active inflammation. The appendix is normal. Vascular/Lymphatic: Mild aortoiliac atherosclerotic disease. 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 The iliac arteries remain patent. The IVC is unremarkable. No portal venous gas. There is no adenopathy. Reproductive: The prostate and seminal vesicles are grossly unremarkable. No pelvic mass. Other: None Musculoskeletal: Mild degenerative changes. No acute osseous pathology. Review of the MIP images confirms the above findings. IMPRESSION: 1. No acute  intrathoracic, abdominal, or pelvic pathology. No pulmonary artery embolus identified. 2. Mild colonic diverticulosis. No bowel obstruction. Normal appendix. 3. Partially visualized 1.6 x 1.3 cm ovoid soft tissue nodule to the right of the trachea concerning for an enlarged lymph node. Further evaluation with neck CT with IV contrast recommended. 4.  Aortic Atherosclerosis (ICD10-I70.0). Electronically Signed   By: Elgie CollardArash  Radparvar M.D.   On: 01/19/2023 00:42   MR BRAIN WO CONTRAST  Result Date: 01/18/2023 CLINICAL DATA:  Headache. EXAM: MRI HEAD WITHOUT  CONTRAST MRA HEAD WITHOUT CONTRAST TECHNIQUE: Multiplanar, multi-echo pulse sequences of the brain and surrounding structures were acquired without intravenous contrast. Angiographic images of the Circle of Willis were acquired using MRA technique without intravenous contrast. 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 Hemorrhage. No hydrocephalus. There is sequela of mild chronic microvascular ischemic change. No extra-axial fluid collection. Vascular: Normal flow voids. Skull and upper cervical spine: Normal marrow signal. Sinuses/Orbits: No acute or significant finding. Other: None. MRA HEAD FINDINGS Anterior circulation: Normal flow signal.  No stenosis or occlusion. Posterior circulation: Normal flow signal. No stenosis or occlusion. Anatomic variants: None IMPRESSION: 1. 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 2. No intracranial large vessel occlusion or significant stenosis. Findings were discussed with Pauline Ausammy Triplett, PA on 01/18/23 at 5:07 PM via telephone Electronically Signed   By: Lorenza CambridgeHemant  Desai M.D.   On: 01/18/2023 17:08   MR ANGIO HEAD WO CONTRAST  Result Date: 01/18/2023 CLINICAL DATA:  Headache. EXAM: MRI HEAD WITHOUT  CONTRAST MRA HEAD WITHOUT CONTRAST TECHNIQUE: Multiplanar, multi-echo pulse sequences of the brain and surrounding structures were  acquired without intravenous contrast. Angiographic images of the Circle of Willis were acquired using MRA technique without intravenous contrast. 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 Hemorrhage. No hydrocephalus. There is sequela of mild chronic microvascular ischemic change. No extra-axial fluid collection. Vascular: Normal flow voids. Skull and upper cervical spine: Normal marrow signal. Sinuses/Orbits: No acute or significant finding. Other: None. MRA HEAD FINDINGS Anterior  circulation: Normal flow signal.  No stenosis or occlusion. Posterior circulation: Normal flow signal. No stenosis or occlusion. Anatomic variants: None IMPRESSION: 1. 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 2. No intracranial large vessel occlusion or significant stenosis. Findings were discussed with Pauline Aus, PA on 01/18/23 at 5:07 PM via telephone Electronically Signed   By: Lorenza Cambridge M.D.   On: 01/18/2023 17:08   CT HEAD WO CONTRAST  Result Date: 01/18/2023 CLINICAL DATA:  Neuro deficit, acute, stroke suspected. Left-sided weakness and headache for 3 days. EXAM: CT HEAD WITHOUT CONTRAST TECHNIQUE: Contiguous axial images were obtained from the base of the skull through the vertex without intravenous contrast. RADIATION DOSE REDUCTION: This exam was performed according to the departmental dose-optimization program which includes automated exposure control, adjustment of the mA and/or kV according to patient size and/or use of iterative reconstruction technique. COMPARISON:  Head CT and MRI 11/19/2012 FINDINGS: Brain: There is no evidence of an acute infarct, intracranial hemorrhage, mass, midline shift, or extra-axial fluid collection. The ventricles and sulci are normal. Vascular: Calcific atherosclerosis at the skull base. No hyperdense vessel. Skull: No fracture or suspicious osseous lesion. Sinuses/Orbits: Visualized  paranasal sinuses and mastoid air cells are clear. Unremarkable orbits. Other: None. IMPRESSION: Negative head CT. Electronically Signed   By: Sebastian Ache M.D.   On: 01/18/2023 14:43   DG Chest 2 View  Result Date: 01/18/2023 CLINICAL DATA:  LEFT side chest pain intermittently, LEFT-sided weakness since Monday EXAM: CHEST - 2 VIEW COMPARISON:  02/23/2022 FINDINGS: Normal heart size, mediastinal contours, and pulmonary vascularity. Lungs clear. No pleural effusion or pneumothorax. Bones unremarkable. IMPRESSION: Normal exam. Electronically Signed   By: Ulyses Southward M.D.   On: 01/18/2023 13:56    ASSESSMENT:  1.  Right paratracheal lymphadenopathy: - Presentation with stroke on 01/19/2023. - CT CAP (01/19/2023): Partially visualized 1.6 x 1.3 cm ovoid soft tissue nodule in the right of the trachea concerning for enlarged lymph node.  No acute intrathoracic, abdominal or pelvic pathology. - CT soft tissue neck (01/19/2023): Right paratracheal lymph node measuring 3.1 x 1.1 x 1.3 cm.  Lesion is immediately posterior to the thyroid lobe and be very atypical to be a pedunculated thyroid lesion and with a normal calcium level.  Small paratracheal nodes more inferiorly, closest 9 mm. - Reports 7 pound weight loss in the last 1 month.  Reports slight dysphagia since CVA. - He has CVA with resulting left-sided weakness.  He also has some numbness on the left side of the body.  He has decreased vision due to bilateral cataracts.  2.  Social/family history: - Lives at home with his wife.  He is currently retired and [employer] for living.  He quit his work after stroke.  He smoked 1 pack/day for the last 40 years, lately smoking 3 to 4 cigarettes/day and quit completely after the stroke. - Maternal grandmother's sister had cancer.  PLAN:  1.  Right paratracheal lymphadenopathy: - I have reviewed images with the patient and his wife. - Recommend PET CT scan. - Recommend ENT evaluation for panendoscopy and  biopsy. - RTC after PET scan.   Orders Placed This Encounter  Procedures   NM PET Image Initial (PI) Skull Base To Thigh    Standing Status:   Future    Standing Expiration Date:   01/31/2024    Order Specific Question:   If indicated for the ordered procedure, I authorize the administration of a radiopharmaceutical per Radiology protocol  Answer:   Yes    Order Specific Question:   Preferred imaging location?    Answer:   [facility]    Order Specific Question:   Release to patient    Answer:   Immediate   Ambulatory referral to Social Work    Referral Priority:   Routine    Referral Type:   Consultation    Referral Reason:   Specialty Services Required    Referred to Provider:   Doreatha Massed, MD    Number of Visits Requested:   1    All questions were answered. The patient knows to call the clinic with any problems, questions or concerns.      Doreatha Massed, MD 01/31/2023 4:54 PM

## 2023-01-31 NOTE — Patient Instructions (Signed)
[place]  Discharge Instructions  You were seen and examined today by Dr. Delton Coombes. Dr. Delton Coombes is a medical oncologist, meaning that he specializes in the treatment of cancer diagnoses. Dr. Delton Coombes discussed your past medical history, family history of cancers, and the events that led to you being here today.  You were referred to Dr. Delton Coombes due to an abnormal CT scan finding that revealed an enlarged lymph node next to your windpipe (trachea). This can be concerning for cancer, as lymph nodes should be enlarged unless there is infection or cancer.  Dr. Delton Coombes has recommended a PET scan. A PET scan is a specialized CT scan that illuminates where there is cancer present in the body. If there is anything that is illuminated, we will then arrange for the biopsy.  Dr. Delton Coombes will refer you to an ear, nose and throat specialist.  Follow-up as scheduled.  Thank you for choosing [facility] to provide your oncology and hematology care.   To afford each patient quality time with our provider, please arrive at least 15 minutes before your scheduled appointment time. You may need to reschedule your appointment if you arrive late (10 or more minutes). Arriving late affects you and other patients whose appointments are after yours.  Also, if you miss three or more appointments without notifying the office, you may be dismissed from the clinic at the provider's discretion.    Again, thank you for choosing [facility].  Our hope is that these requests will decrease the amount of time that you wait before being seen by our physicians.   If you have a lab appointment with the [facility] please come in thru the Main Entrance and check in at the main information desk.           _____________________________________________________________  Should you have questions after your visit to [facility], please  contact our office at [phone] and follow the prompts.  Our office hours are 8:00 a.m. to 4:30 p.m. Monday - Thursday and 8:00 a.m. to 2:30 p.m. Friday.  Please note that voicemails left after 4:00 p.m. may not be returned until the following business day.  We are closed weekends and all major holidays.  You do have access to a nurse 24-7, just call the main number to the clinic [phone] and do not press any options, hold on the line and a nurse will answer the phone.    For prescription refill requests, have your pharmacy contact our office and allow 72 hours.    Masks are optional in the cancer centers. If you would like for your care team to wear a mask while they are taking care of you, please let them know. You may have one support person who is at least 64 years old accompany you for your appointments.

## 2023-02-01 ENCOUNTER — Ambulatory Visit (HOSPITAL_COMMUNITY): Payer: Medicaid Other

## 2023-02-02 ENCOUNTER — Encounter: Payer: Self-pay | Admitting: Internal Medicine

## 2023-02-02 ENCOUNTER — Inpatient Hospital Stay: Payer: Medicaid Other | Admitting: Licensed Clinical Social Worker

## 2023-02-02 ENCOUNTER — Ambulatory Visit (INDEPENDENT_AMBULATORY_CARE_PROVIDER_SITE_OTHER): Payer: Medicaid Other | Admitting: Internal Medicine

## 2023-02-02 VITALS — BP 122/79 | HR 79 | Ht 71.0 in | Wt 192.0 lb

## 2023-02-02 DIAGNOSIS — Z720 Tobacco use: Secondary | ICD-10-CM

## 2023-02-02 DIAGNOSIS — I10 Essential (primary) hypertension: Secondary | ICD-10-CM | POA: Diagnosis not present

## 2023-02-02 DIAGNOSIS — Z7689 Persons encountering health services in other specified circumstances: Secondary | ICD-10-CM | POA: Diagnosis not present

## 2023-02-02 DIAGNOSIS — R590 Localized enlarged lymph nodes: Secondary | ICD-10-CM

## 2023-02-02 DIAGNOSIS — I639 Cerebral infarction, unspecified: Secondary | ICD-10-CM | POA: Diagnosis not present

## 2023-02-02 DIAGNOSIS — E785 Hyperlipidemia, unspecified: Secondary | ICD-10-CM | POA: Diagnosis not present

## 2023-02-02 DIAGNOSIS — E1136 Type 2 diabetes mellitus with diabetic cataract: Secondary | ICD-10-CM

## 2023-02-02 MED ORDER — ONETOUCH ULTRA VI STRP: ORAL_STRIP | 12 refills | Status: DC

## 2023-02-02 NOTE — Progress Notes (Signed)
Established Patient Office Visit  Subjective   Patient ID: Robert Lyons, male    DOB: 1961/08/07  Age: 62 y.o. MRN: [id number]  Chief Complaint  Patient presents with   Hypertension    Follow up   Diabetes    Follow up   Hyperlipidemia    Follow up   Robert Lyons returns to care today for HTN follow-up.  He was last seen by me on 1/11 at which time amlodipine 5 mg daily was added to his antihypertensive regimen.  Mounjaro 2.5 mg weekly was started for improved treatment of diabetes.  Lastly, atorvastatin was increased to 40 mg daily given his significantly elevated 10-year ASCVD risk score.  In the interim, Robert Lyons presented to the emergency department on 1/24 for chest pain as well as left upper and lower extremity weakness.  His workup subsequently revealed an acute infarct in the right pons.  He was admitted to [facility] and started on DAPT (ASA/Plavix).  He was discharged home the following day (1/25).  Robert Lyons then returned to the ED on 1/30 endorsing dizziness.  He was treated with meclizine and his symptoms resolved.  There have otherwise been no acute interval events.   Robert Lyons reports feeling fairly well today.  He has no acute concerns to discuss aside from wishing to review his recent medical history.  He continues to endorse left upper and lower extremity weakness there is otherwise asymptomatic.  Past Medical History:  Diagnosis Date   GERD (gastroesophageal reflux disease)    Hypertension    Pneumonia    Type 2 diabetes mellitus ([place])    Past Surgical History:  Procedure Laterality Date   No prior surgery     Social History   Tobacco Use   Smoking status: Former    Packs/day: 1.00    Years: 15.00    Total pack years: 15.00    Types: Cigarettes    Quit date: 01/17/2023    Years since quitting: 0.0   Smokeless tobacco: Never  Vaping Use   Vaping Use: Never used  Substance Use Topics   Alcohol use: No    Comment: none since 2014    Drug use: No   Family History  Problem Relation Age of Onset   Diabetes Mother    Hypertension Mother    Diabetes Maternal Aunt    Diabetes Maternal Uncle    No Known Allergies  Review of Systems  Cardiovascular:  Negative for chest pain.  Neurological:  Positive for focal weakness (Left upper and lower extremity). Negative for dizziness.  All other systems reviewed and are negative.    Objective:     BP 122/79   Pulse 79   Ht 5' 11"$  (1.803 m)   Wt 192 lb (87.1 kg)   SpO2 98%   BMI 26.78 kg/m  BP Readings from Last 3 Encounters:  02/02/23 122/79  01/31/23 132/77  01/24/23 120/79   Physical Exam Vitals reviewed.  Constitutional:      General: He is not in acute distress.    Appearance: Normal appearance. He is not ill-appearing.  HENT:     Head: Normocephalic and atraumatic.     Right Ear: External ear normal.     Left Ear: External ear normal.     Nose: Nose normal. No congestion or rhinorrhea.     Mouth/Throat:     Mouth: Mucous membranes are moist.     Pharynx: Oropharynx is clear.  Eyes:  Extraocular Movements: Extraocular movements intact.     Conjunctiva/sclera: Conjunctivae normal.     Pupils: Pupils are equal, round, and reactive to light.  Cardiovascular:     Rate and Rhythm: Normal rate and regular rhythm.     Pulses: Normal pulses.     Heart sounds: Normal heart sounds. No murmur heard. Pulmonary:     Effort: Pulmonary effort is normal.     Breath sounds: Normal breath sounds. No wheezing, rhonchi or rales.  Abdominal:     General: Abdomen is flat. Bowel sounds are normal. There is no distension.     Palpations: Abdomen is soft.     Tenderness: There is no abdominal tenderness.  Musculoskeletal:        General: No swelling or deformity.     Cervical back: Normal range of motion.  Skin:    General: Skin is warm and dry.     Capillary Refill: Capillary refill takes less than 2 seconds.  Neurological:     General: No focal deficit  present.     Mental Status: He is alert and oriented to person, place, and time.     Motor: Weakness (Proximal muscle weakness appreciated in left upper and lower extremities.  Sensation intact.  3/5 strength in the left upper and lower extremity.) present.  Psychiatric:        Mood and Affect: Mood normal.        Behavior: Behavior normal.        Thought Content: Thought content normal.   Last CBC Lab Results  Component Value Date   WBC 8.0 01/24/2023   HGB 14.6 01/24/2023   HCT 42.3 01/24/2023   MCV 89.2 01/24/2023   MCH 30.8 01/24/2023   RDW 11.8 01/24/2023   PLT 270 [id number]   Last metabolic panel Lab Results  Component Value Date   GLUCOSE 166 (H) 01/24/2023   NA 138 01/24/2023   K 4.2 01/24/2023   CL 103 01/24/2023   CO2 22 01/24/2023   BUN 19 01/24/2023   CREATININE 1.29 (H) 01/24/2023   GFRNONAA >60 01/24/2023   CALCIUM 10.5 (H) 01/24/2023   PROT 7.1 01/18/2023   ALBUMIN 4.1 01/18/2023   LABGLOB 2.5 12/02/2022   AGRATIO 1.9 12/02/2022   BILITOT 1.0 01/18/2023   ALKPHOS 58 01/18/2023   AST 28 01/18/2023   ALT 19 01/18/2023   ANIONGAP 13 01/24/2023   Last lipids Lab Results  Component Value Date   CHOL 81 01/19/2023   HDL 34 (L) 01/19/2023   LDLCALC 29 01/19/2023   TRIG 89 01/19/2023   CHOLHDL 2.4 01/19/2023   Last hemoglobin A1c Lab Results  Component Value Date   HGBA1C 8.7 (H) 12/02/2022   Last thyroid functions Lab Results  Component Value Date   TSH 1.890 12/02/2022   Last vitamin D Lab Results  Component Value Date   VD25OH 15.4 (L) 12/02/2022   Last vitamin B12 and Folate Lab Results  Component Value Date   VITAMINB12 381 12/02/2022   FOLATE 9.7 12/02/2022   The ASCVD Risk score (Arnett DK, et al., 2019) failed to calculate for the following reasons:   The patient has a prior MI or stroke diagnosis    Assessment & Plan:   Problem List Items Addressed This Visit       Essential hypertension    Amlodipine 5 mg daily was  added to his antihypertensive regimen at his last appointment.  He is additionally prescribed lisinopril 40 mg daily as well as metoprolol  tartrate 50 mg twice daily.  His blood pressure today is 122/79. -No medication changes today.  Continue current antihypertensive regimen.      Acute CVA (cerebrovascular accident) [facility]) - Primary    Presented to [facility] ED on 1/24 reporting left upper and lower extremity weakness.  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  Evaluated by teleneurology, who recommended admission for stroke workup.  Ultimately was discharged on DAPT (ASA/Plavix) x 21 days followed by aspirin monotherapy.  Left upper and lower extremity weakness are present on exam today. -Continue DAPT x 21 days, then ASA 81 mg daily monotherapy -Continue atorvastatin 80 mg daily -He will start PT/OT next week -Follow-up in 1 month      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HCC)    123456 8.7 in December 2023.  Mounjaro 2.5 mg weekly was prescribed at his last appointment, however he was unable to fill the prescription as his insurance prefers Ozempic. -Continue metformin 850 mg twice daily -Start Ozempic 0.25 mg weekly -Follow-up in 4 weeks for reassessment      Paratracheal lymphadenopathy    Incidentally noted right paratracheal lymphadenopathy during recent admission.  He was evaluated by oncology (Dr. Delton Coombes) during his admission, he recommended outpatient PET scan and ENT referral for endoscopy/biopsy.  He is scheduled to see ENT and undergo a PET scan next week.      Hyperlipidemia    Atorvastatin was increased to 40 mg daily at his last appointment, and then further increase to 80 mg daily during recent admission for acute CVA.  He has not experienced any adverse side effects with increasing doses of atorvastatin. -No changes today.      Tobacco abuse    Reports today that he has completely stopped smoking after his recent hospital admission.   He was congratulated on his progress today and encouraged to continue to abstain from tobacco use.       Return in about 4 weeks (around 03/02/2023).    [name], MD

## 2023-02-02 NOTE — Progress Notes (Signed)
[place] Work  Initial Assessment   Robert Lyons is a 62 y.o. year old male contacted by phone. Clinical Social Work was referred by medical provider for assessment of psychosocial needs.   SDOH (Social Determinants of Health) assessments performed: Yes SDOH Interventions    Flowsheet Row Office Visit from 01/31/2023 in [facility] at [place] from 01/26/2023 in [facility] Interventions    Food Insecurity Interventions Intervention Not Indicated --  Housing Interventions Intervention Not Indicated --  Transportation Interventions Patient Resources (Friends/Family), AMB Referral Intervention Not Indicated  Utilities Interventions Intervention Not Indicated --       SDOH Screenings   Food Insecurity: No Food Insecurity (01/31/2023)  Housing: Low Risk  (01/31/2023)  Transportation Needs: Unmet Transportation Needs (01/31/2023)  Utilities: Not At Risk (01/31/2023)  Depression (PHQ2-9): Low Risk  (02/02/2023)  Recent Concern: Depression (PHQ2-9) - Medium Risk (12/02/2022)  Tobacco Use: Medium Risk (02/02/2023)     Distress Screen completed: No     No data to display            Family/Social Information:  Housing Arrangement: Pt states after his mother died he moved in with a woman who has been like a 2nd Mom to him for most of his life.  Since pt's stroke she and his daughter have been assisting him. Family members/support persons in your life? Pt's daughter primarily takes care of paperwork for pt as he has left sided weakness since his stroke and cataracts.  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  Pt was approved for Medicaid on December 1st, but is still awaiting approval for SSI.  Income source: Paediatric nurse concerns: Yes, current concerns Type of concern: Utilities and  Medical bills Food access concerns: no Religious or spiritual practice: Not known Services Currently in place:  none  Coping/ Adjustment to diagnosis: Patient understands treatment plan and what happens next? yes Concerns about diagnosis and/or treatment: Overwhelmed by information, How I will pay for the services I need, How will I care for myself, and Quality of life Patient reported stressors: Finances, Anxiety/ nervousness, and Adjusting to my illness Hopes and/or priorities: Pt's priority is to complete diagnostics and begin treatment w/ the hope of positive results Patient enjoys time with family/ friends Current coping skills/ strengths: Motivation for treatment/growth  and Supportive family/friends     SUMMARY: Current SDOH Barriers:  Financial constraints related to minimal fixed income and Limited social support  Clinical Social Work Clinical Goal(s):  Explore community resource options for unmet needs related to:  Financial Strain   Interventions: Discussed common feeling and emotions when being diagnosed with cancer, and the importance of support during treatment Informed patient of the support team roles and support services at [facility] Provided [place] contact information and encouraged patient to call with any questions or concerns Referred patient to [name], will see pt at 2/22 appt to look over paperwork from social security and provide with information about the [employer].   Follow Up Plan: CSW will see patient on 2/22 Patient verbalizes understanding of plan: Yes    [name], LCSW   Patient is participating in a Managed Medicaid Plan:  Yes

## 2023-02-02 NOTE — Patient Instructions (Signed)
It was a pleasure to see you today.  Thank you for giving [place] the opportunity to be involved in your care.  Below is a brief recap of your visit and next steps.  We will plan to see you again in 4 weeks.  Summary No medication changes today Start Ozempic 0.25 mg weekly and we will follow up in 1 month to reassess Start PT/OT next week You have a PET scan and an appointment with the ENT doctor next week

## 2023-02-03 DIAGNOSIS — Z1211 Encounter for screening for malignant neoplasm of colon: Secondary | ICD-10-CM | POA: Diagnosis not present

## 2023-02-03 LAB — COLOGUARD: Cologuard: NEGATIVE

## 2023-02-07 DIAGNOSIS — K219 Gastro-esophageal reflux disease without esophagitis: Secondary | ICD-10-CM | POA: Diagnosis not present

## 2023-02-07 DIAGNOSIS — Z7689 Persons encountering health services in other specified circumstances: Secondary | ICD-10-CM | POA: Diagnosis not present

## 2023-02-07 DIAGNOSIS — Z8673 Personal history of transient ischemic attack (TIA), and cerebral infarction without residual deficits: Secondary | ICD-10-CM | POA: Diagnosis not present

## 2023-02-07 DIAGNOSIS — R591 Generalized enlarged lymph nodes: Secondary | ICD-10-CM | POA: Diagnosis not present

## 2023-02-08 ENCOUNTER — Other Ambulatory Visit (HOSPITAL_COMMUNITY): Payer: Self-pay | Admitting: Otolaryngology

## 2023-02-08 ENCOUNTER — Ambulatory Visit (HOSPITAL_COMMUNITY): Payer: Medicaid Other

## 2023-02-08 ENCOUNTER — Encounter (HOSPITAL_COMMUNITY): Payer: Self-pay | Admitting: Otolaryngology

## 2023-02-08 DIAGNOSIS — R591 Generalized enlarged lymph nodes: Secondary | ICD-10-CM

## 2023-02-08 NOTE — Assessment & Plan Note (Signed)
Incidentally noted right paratracheal lymphadenopathy during recent admission.  He was evaluated by oncology (Dr. Delton Coombes) during his admission, he recommended outpatient PET scan and ENT referral for endoscopy/biopsy.  He is scheduled to see ENT and undergo a PET scan next week.

## 2023-02-08 NOTE — Assessment & Plan Note (Signed)
Reports today that he has completely stopped smoking after his recent hospital admission.  He was congratulated on his progress today and encouraged to continue to abstain from tobacco use.

## 2023-02-08 NOTE — Assessment & Plan Note (Signed)
Atorvastatin was increased to 40 mg daily at his last appointment, and then further increase to 80 mg daily during recent admission for acute CVA.  He has not experienced any adverse side effects with increasing doses of atorvastatin. -No changes today.

## 2023-02-08 NOTE — Assessment & Plan Note (Signed)
Amlodipine 5 mg daily was added to his antihypertensive regimen at his last appointment.  He is additionally prescribed lisinopril 40 mg daily as well as metoprolol tartrate 50 mg twice daily.  His blood pressure today is 122/79. -No medication changes today.  Continue current antihypertensive regimen.

## 2023-02-08 NOTE — Assessment & Plan Note (Signed)
A1c 8.7 in December 2023.  Mounjaro 2.5 mg weekly was prescribed at his last appointment, however he was unable to fill the prescription as his insurance prefers Ozempic. -Continue metformin 850 mg twice daily -Start Ozempic 0.25 mg weekly -Follow-up in 4 weeks for reassessment

## 2023-02-08 NOTE — Assessment & Plan Note (Signed)
Presented to [facility] ED on 1/24 reporting left upper and lower extremity weakness.  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  Evaluated by teleneurology, who recommended admission for stroke workup.  Ultimately was discharged on DAPT (ASA/Plavix) x 21 days followed by aspirin monotherapy.  Left upper and lower extremity weakness are present on exam today. -Continue DAPT x 21 days, then ASA 81 mg daily monotherapy -Continue atorvastatin 80 mg daily -He will start PT/OT next week -Follow-up in 1 month

## 2023-02-09 ENCOUNTER — Ambulatory Visit (HOSPITAL_COMMUNITY): Admission: RE | Admit: 2023-02-09 | Payer: Medicaid Other | Source: Ambulatory Visit

## 2023-02-09 ENCOUNTER — Ambulatory Visit (HOSPITAL_COMMUNITY)
Admission: RE | Admit: 2023-02-09 | Discharge: 2023-02-09 | Disposition: A | Discharge status: Home / Self Care | Payer: Medicaid Other | Source: Ambulatory Visit | Attending: Otolaryngology | Admitting: Otolaryngology

## 2023-02-09 DIAGNOSIS — Z7689 Persons encountering health services in other specified circumstances: Secondary | ICD-10-CM | POA: Diagnosis not present

## 2023-02-09 DIAGNOSIS — R591 Generalized enlarged lymph nodes: Secondary | ICD-10-CM | POA: Insufficient documentation

## 2023-02-09 DIAGNOSIS — R0602 Shortness of breath: Secondary | ICD-10-CM | POA: Diagnosis not present

## 2023-02-10 ENCOUNTER — Encounter (HOSPITAL_COMMUNITY): Payer: Self-pay | Admitting: Occupational Therapy

## 2023-02-10 ENCOUNTER — Ambulatory Visit (HOSPITAL_COMMUNITY): Payer: Medicaid Other | Admitting: Occupational Therapy

## 2023-02-10 ENCOUNTER — Other Ambulatory Visit: Payer: Self-pay

## 2023-02-10 ENCOUNTER — Ambulatory Visit (HOSPITAL_COMMUNITY): Payer: Medicaid Other | Attending: Family Medicine

## 2023-02-10 DIAGNOSIS — R29818 Other symptoms and signs involving the nervous system: Secondary | ICD-10-CM | POA: Insufficient documentation

## 2023-02-10 DIAGNOSIS — R278 Other lack of coordination: Secondary | ICD-10-CM | POA: Insufficient documentation

## 2023-02-10 DIAGNOSIS — R29898 Other symptoms and signs involving the musculoskeletal system: Secondary | ICD-10-CM | POA: Diagnosis not present

## 2023-02-10 DIAGNOSIS — R262 Difficulty in walking, not elsewhere classified: Secondary | ICD-10-CM | POA: Insufficient documentation

## 2023-02-10 DIAGNOSIS — I639 Cerebral infarction, unspecified: Secondary | ICD-10-CM | POA: Insufficient documentation

## 2023-02-10 DIAGNOSIS — Z7689 Persons encountering health services in other specified circumstances: Secondary | ICD-10-CM | POA: Diagnosis not present

## 2023-02-10 DIAGNOSIS — I69354 Hemiplegia and hemiparesis following cerebral infarction affecting left non-dominant side: Secondary | ICD-10-CM | POA: Insufficient documentation

## 2023-02-10 NOTE — Progress Notes (Signed)
Suttle, Rosanne Ashing, MD  Tempie Hoist going to postbone the biopsy request until upcoming PET (which is apparently scheduled for 2/29) is completed.  Thanks.  Dylan

## 2023-02-10 NOTE — Therapy (Signed)
OUTPATIENT PHYSICAL THERAPY NEURO EVALUATION   Patient Name: Robert Lyons MRN: [id number] DOB:[birth year], 62 y.o., male Today's Date: 02/10/2023   PCP: [name] MD  REFERRING PROVIDER: Roxan Hockey, MD  END OF SESSION:  PT End of Session - 02/10/23 1534     Visit Number 1    Number of Visits 16    Date for PT Re-Evaluation 04/07/23    Authorization Type [place] Medicaid [employer] (requested for 16 visits, please check)    Progress Note Due on Visit 8    PT Start Time 0910    PT Stop Time 0945    PT Time Calculation (min) 35 min    Equipment Utilized During Treatment Gait belt    Activity Tolerance Patient limited by fatigue    Behavior During Therapy WFL for tasks assessed/performed            Past Medical History:  Diagnosis Date   GERD (gastroesophageal reflux disease)    Hypertension    Pneumonia    Type 2 diabetes mellitus ([place])    Past Surgical History:  Procedure Laterality Date   No prior surgery     Patient Active Problem List   Diagnosis Date Noted   Paratracheal lymphadenopathy 01/31/2023   Acute CVA (cerebrovascular accident) ([place]) 01/18/2023   Abdominal pain 01/18/2023   Chest pain 01/18/2023   Tobacco abuse 01/18/2023   Hypokalemia 01/18/2023   Vitamin D deficiency 01/05/2023   GERD (gastroesophageal reflux disease) 12/08/2022   Cataracts, both eyes 12/08/2022   Encounter for general adult medical examination with abnormal findings 12/08/2022   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[place]) [id number]   Essential hypertension 07/10/2017   Hyperlipidemia 07/10/2017   Cigarette nicotine dependence without complication [id number]    ONSET DATE: 01/18/2023  REFERRING DIAG: I63.9 (ICD-10-CM) - Acute CVA (cerebrovascular accident) ([place])  THERAPY DIAG:  Weakness of left lower extremity  Difficulty walking  Rationale for Evaluation and Treatment: Rehabilitation  SUBJECTIVE:                                                                                                                                                                                              SUBJECTIVE STATEMENT: Patient states that everything on the L LE went to a "limp" since he had the stroke in January 2024. Also reports of numbness on the LEs. Patient was admitted to the hospital when he had a stroke in 01/18/2023 and was D/C the other night. Denies getting rehabilitation services while at the hospital but he was immediately referred to outpatient PT evaluation and management. Patient is also receiving outpatient OT  services at this time.  Pt accompanied by: significant other  PERTINENT HISTORY: DM, HTN  PAIN:  Are you having pain? Yes: NPRS scale: 5-6/10 Pain location: L LE Pain description: sharp, intermittent Aggravating factors: comes and goes randomly Relieving factors: comes and goes randomly  PRECAUTIONS: None  WEIGHT BEARING RESTRICTIONS: No  FALLS: Has patient fallen in last 6 months? No  LIVING ENVIRONMENT: Lives with:  significant other Lives in: House/apartment Stairs: No Has following equipment at home: Single point cane and shower chair  PLOF: Independent  PATIENT GOALS: "to get my strength back"  OBJECTIVE:   DIAGNOSTIC FINDINGS:  MRI Brain without contrast 01/18/2023 IMPRESSION: 1. 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 2. No intracranial large vessel occlusion or significant stenosis.  COGNITION: Overall cognitive status: Within functional limits for tasks assessed   SENSATION: Patient reports that the L LE has can feel "more" than the R as to light touch  COORDINATION: Moderately impaired on the L LE as to heel-to-shin No difficulty with alternating seated foot taps  MUSCLE LENGTH: Moderate tightness on B hamstrings and gastrocnemius  POSTURE: rounded shoulders, forward head, and L knee slightly flexed in standing  LOWER EXTREMITY ROM:     Active  Right Eval Left Eval  Hip  flexion [facility]  Hip extension [facility]  Hip abduction [facility]  Hip adduction    Hip internal rotation    Hip external rotation    Knee flexion [facility] WFL  Knee extension [facility]  Ankle dorsiflexion [facility] WFL  Ankle plantarflexion [facility] WFL  Ankle inversion    Ankle eversion     (Blank rows = not tested)  LOWER EXTREMITY MMT:    MMT Right Eval Left Eval  Hip flexion 5 3+  Hip extension 4 3+  Hip abduction 4+ 3+  Hip adduction    Hip internal rotation    Hip external rotation    Knee flexion 5 4-  Knee extension 4 3+  Ankle dorsiflexion 5 3+  Ankle plantarflexion 5 3+  Ankle inversion    Ankle eversion    (Blank rows = not tested)  BED MOBILITY:  Sit to supine Complete Independence Supine to sit Complete Independence Rolling to Right Complete Independence Rolling to Left Complete Independence  TRANSFERS: Assistive device utilized: None  Sit to stand: SBA Stand to sit: SBA  GAIT: Gait pattern: decreased step length- Right, decreased stance time- Left, decreased stride length, decreased hip/knee flexion- Left, decreased ankle dorsiflexion- Left, Left hip hike, knee flexed in stance- Left, lateral lean- Right, narrow BOS, poor foot clearance- Right, and poor foot clearance- Left.  Distance walked: 207 ft Assistive device utilized: Single point cane Level of assistance: CGA Comments: moderately unsteady  FUNCTIONAL TESTS:  5 times sit to stand: 21.53 sec Timed up and go (TUG): 22.83 sec 2 minute walk test: 207 ft  TODAY'S TREATMENT:  DATE:  02/10/23 Evaluation and patient education    PATIENT EDUCATION: Education details: Educated on the pathoanatomy of CVA. Educated on the goals and course of rehab. Education on falls reduction/prevention at home. Person educated: Patient and significant other Education method: Explanation Education  comprehension: verbalized understanding  HOME EXERCISE PROGRAM: None provided to date  GOALS: Goals reviewed with patient? Yes  SHORT TERM GOALS: Target date: 03/11/2023  Pt will demonstrate indep in HEP to facilitate carry-over of skilled services and improve functional outcomes  Goal status: INITIAL  LONG TERM GOALS: Target date: 04/07/2023  Pt will have a decrease in TUG score by at least 3 sec in order to demonstrate clinically significant improvement in community ambulation Baseline: 22.83 sec Goal status: INITIAL  2.  Pt will decrease 5TSTS by at least 3 seconds in order to demonstrate clinically significant improvement in LE strength  Baseline: 21.53 sec Goal status: INITIAL  3.  Pt will increase 2MWT by at least 40 ft in order to demonstrate clinically significant improvement in community ambulation  Baseline: 207 ft Goal status: INITIAL  4.  Pt will demonstrate increase in LE strength to 4+/5 to facilitate ease and safety in ambulation  Baseline: 3+/5 Goal status: INITIAL  5.  Pt will be able to walk on level surfaces > 100 ft without an assistive device with little to no gait deviation Goal status: INITIAL  ASSESSMENT:  CLINICAL IMPRESSION: Patient is a 62 y.o. male who was seen today for physical therapy evaluation and treatment for CVA. Patient was diagnosed with CVA by referring provider further defined by difficulty with walking due to weakness, incoordination, and decreased proprioception. Skilled PT is required to address the impairments and functional limitations listed below.   OBJECTIVE IMPAIRMENTS: Abnormal gait, decreased balance, decreased endurance, decreased mobility, difficulty walking, decreased strength, and impaired flexibility.   ACTIVITY LIMITATIONS: lifting, bending, sitting, standing, squatting, stairs, transfers, and locomotion level  PARTICIPATION LIMITATIONS: meal prep, cleaning, laundry, driving, shopping, and community  activity  PERSONAL FACTORS: Fitness are also affecting patient's functional outcome.   REHAB POTENTIAL: Good  CLINICAL DECISION MAKING: Stable/uncomplicated  EVALUATION COMPLEXITY: Low  PLAN:  PT FREQUENCY: 2x/week  PT DURATION: 8 weeks  PLANNED INTERVENTIONS: Therapeutic exercises, Therapeutic activity, Neuromuscular re-education, Balance training, Gait training, Patient/Family education, Self Care, Stair training, and Electrical stimulation  PLAN FOR NEXT SESSION: Provide HEP. May begin LE flexibility, strengthening, balance and gait training.   Harvie Heck. Tyquarius Paglia, PT, DPT, OCS Board-Certified Clinical Specialist in [place] # ([place]): [phone] T 02/10/2023, 3:54 PM

## 2023-02-10 NOTE — Therapy (Signed)
OUTPATIENT OCCUPATIONAL THERAPY NEURO EVALUATION  Patient Name: Robert Lyons MRN: [id number] DOB:[birth year], 62 y.o., male Today's Date: 02/12/2023  PCP: [name], MD REFERRING PROVIDER: Roxan Hockey, MD  END OF SESSION:  OT End of Session - 02/12/23 2016     Visit Number 1    Number of Visits 9    Date for OT Re-Evaluation 03/17/23    Authorization Type Managed medicaid    OT Start Time 0815    OT Stop Time [id number]    OT Time Calculation (min) 35 min    Activity Tolerance Patient tolerated treatment well    Behavior During Therapy [facility] for tasks assessed/performed             Past Medical History:  Diagnosis Date   GERD (gastroesophageal reflux disease)    Hypertension    Pneumonia    Type 2 diabetes mellitus ([place])    Past Surgical History:  Procedure Laterality Date   No prior surgery     Patient Active Problem List   Diagnosis Date Noted   Paratracheal lymphadenopathy 01/31/2023   Acute CVA (cerebrovascular accident) ([place]) 01/18/2023   Abdominal pain 01/18/2023   Chest pain 01/18/2023   Tobacco abuse 01/18/2023   Hypokalemia 01/18/2023   Vitamin D deficiency 01/05/2023   GERD (gastroesophageal reflux disease) 12/08/2022   Cataracts, both eyes 12/08/2022   Encounter for general adult medical examination with abnormal findings 12/08/2022   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[place]) [id number]   Essential hypertension 07/10/2017   Hyperlipidemia 07/10/2017   Cigarette nicotine dependence without complication [id number]    ONSET DATE: 01/18/23  REFERRING DIAG: R [place] CVA  THERAPY DIAG:  Other lack of coordination  Other symptoms and signs involving the nervous system  Hemiplegia and hemiparesis following cerebral infarction affecting left non-dominant side ([place])  Rationale for Evaluation and Treatment: Rehabilitation  SUBJECTIVE:   SUBJECTIVE STATEMENT: "I don't know how to feel some days." Pt accompanied by:  significant other  PERTINENT HISTORY: Patient states that everything on the L LE went to a "limp" since he had the stroke in January 2024. Also reports of numbness on the LEs. Patient was admitted to the hospital when he had a stroke on 01/18/2023.  PRECAUTIONS: None  WEIGHT BEARING RESTRICTIONS: No  PAIN:  Are you having pain? No  FALLS: Has patient fallen in last 6 months? No  LIVING ENVIRONMENT: Lives with: lives with an adult companion Lives in: House/apartment Stairs: Yes: Internal: Full flight steps; on right going up and External: 1 steps; none Has following equipment at home: Single point cane and shower chair  PLOF: Independent  PATIENT GOALS: To get strength back in my L side.   OBJECTIVE:   HAND DOMINANCE: Right  ADLs: Overall ADLs: Pt reports difficulty getting his clothes on, states he gets mixed up with it at times. Additionally due to strength and endurance he has a difficult time cooking and cleaning. Pt's partner assists with medication management.   MOBILITY STATUS: Independent  POSTURE COMMENTS:  No Significant postural limitations Sitting balance: Moves/returns truncal midpoint >2 inches in all planes  ACTIVITY TOLERANCE: Activity tolerance: Pt reports that his endurance has been affected, where he has difficulty sustaining activities due to getting out of breath and weak.   FUNCTIONAL OUTCOME MEASURES: Quick Dash: 45.45  UPPER EXTREMITY ROM:    Active ROM Left eval  Shoulder flexion 132  Shoulder abduction 87  Shoulder internal rotation 90  Shoulder external rotation 45  Elbow  flexion 69  Elbow extension 3  Wrist flexion 30  Wrist extension -2  Wrist ulnar deviation 28  Wrist radial deviation -5  Wrist pronation WFL  Wrist supination 45  (Blank rows = not tested)  UPPER EXTREMITY MMT:     MMT Left eval  Shoulder flexion 4/5  Shoulder abduction 4-/5  Shoulder adduction 4/5  Shoulder extension 4+/5  Shoulder internal rotation 4-/5   Shoulder external rotation 4-/5  Elbow flexion 4/5  Elbow extension 4+/5  Wrist flexion 4/5  Wrist extension 3+/5  Wrist ulnar deviation 4/5  Wrist radial deviation 3/5  Wrist pronation 5/5  Wrist supination 4+/5  (Blank rows = not tested)  HAND FUNCTION: Grip strength: Right: 70 lbs; Left: 20 lbs, Lateral pinch: Right: 20 lbs, Left: 3 lbs, and 3 point pinch: Right: 10 lbs, Left: 3 lbs  COORDINATION: 9 Hole Peg test: Right: 33.74 sec; Left: 46.55 sec  SENSATION: Light touch: Impaired   EDEMA: Mild edema noted in the L hand  COGNITION: Overall cognitive status: Within functional limits for tasks assessed  VISION: Subjective report: Pt reports he has severe cataracts in both eyes. Was supposed to have surgery, but it got postponed.  Baseline vision: Wears glasses all the time Visual history: cataracts  VISION ASSESSMENT: Not tested  Patient has difficulty with following activities due to following visual impairments: Driving, reading  PERCEPTION: WFL  PRAXIS: WFL  OBSERVATIONS: At times pt demonstrates some word finding difficulties.    TODAY'S TREATMENT:                                                                                                                              DATE:   02/10/23: Evaluation Only   PATIENT EDUCATION: Education details: Evaluation Only Person educated: Patient and Spouse Education method: Explanation Education comprehension: verbalized understanding  HOME EXERCISE PROGRAM: Will begin during first treatment   GOALS: Goals reviewed with patient? Yes  SHORT TERM GOALS: Target date: 03/17/23  Pt will be provided with and educated on HEP to improve mobility in LUE required for ADL completion.  Goal status: INITIAL  2.  Pt will improve strength to 4+/5 in order to improve ability to perform lifting tasks during meal preparation and cleaning tasks.  Goal status: INITIAL  3.  Pt will increase left grip strength by 10# and pinch  strength by 3# to improve ability to grasp and hold pots and pans during simple meal preparation.  Goal status: INITIAL  4.  Pt will increase left hand coordination by completing 9 hole peg test in under 35 seconds, improving ability to manipulate lids and tops on jars, bottles, etc.  Goal status: INITIAL  5.  Pt will improve LUE motor planning by completing functional reaching tasks with minimal compensatory assist from scapular and trapezius regions.  Goal status: INITIAL  6. Pt will increase A/ROM of LUE by 40 degrees in all directions in order to reach overhead and behind back  during dressing and bathing tasks.   Goal status: INITIAL   ASSESSMENT:  CLINICAL IMPRESSION: Patient is a 62 y.o. male who was seen today for occupational therapy evaluation for s/p R CVA. Pt reports increased weakness and coordination deficits, affecting his everyday life.    PERFORMANCE DEFICITS: in functional skills including ADLs, IADLs, coordination, sensation, ROM, strength, muscle spasms, Fine motor control, Gross motor control, body mechanics, vision, and UE functional use, cognitive skills including attention, memory, problem solving, and safety awareness.  IMPAIRMENTS: are limiting patient from ADLs, IADLs, rest and sleep, work, leisure, and social participation.   CO-MORBIDITIES: has no other co-morbidities that affects occupational performance. Patient will benefit from skilled OT to address above impairments and improve overall function.  MODIFICATION OR ASSISTANCE TO COMPLETE EVALUATION: No modification of tasks or assist necessary to complete an evaluation.  OT OCCUPATIONAL PROFILE AND HISTORY: Problem focused assessment: Including review of records relating to presenting problem.  CLINICAL DECISION MAKING: LOW - limited treatment options, no task modification necessary  REHAB POTENTIAL: Good  EVALUATION COMPLEXITY: Low    PLAN:  OT FREQUENCY: 2x/week  OT DURATION: 4 weeks  PLANNED  INTERVENTIONS: self care/ADL training, therapeutic exercise, therapeutic activity, neuromuscular re-education, passive range of motion, functional mobility training, electrical stimulation, ultrasound, paraffin, moist heat, cryotherapy, patient/family education, cognitive remediation/compensation, energy conservation, and DME and/or AE instructions  RECOMMENDED OTHER SERVICES: PT and Speech for Cognition  CONSULTED AND AGREED WITH PLAN OF CARE: Patient  PLAN FOR NEXT SESSION: A/ROM, Weight bearing, Closed chain exercises, fine motor tasks   Paulita Fujita, OTR/L [facility] [address] [place], [place] 02/12/2023, 8:18 PM

## 2023-02-13 ENCOUNTER — Encounter (HOSPITAL_COMMUNITY): Payer: Self-pay | Admitting: Occupational Therapy

## 2023-02-13 ENCOUNTER — Ambulatory Visit (HOSPITAL_COMMUNITY): Payer: Medicaid Other | Admitting: Occupational Therapy

## 2023-02-13 ENCOUNTER — Ambulatory Visit (HOSPITAL_COMMUNITY): Payer: Medicaid Other | Admitting: Physical Therapy

## 2023-02-13 ENCOUNTER — Telehealth: Payer: Self-pay

## 2023-02-13 DIAGNOSIS — R29818 Other symptoms and signs involving the nervous system: Secondary | ICD-10-CM | POA: Diagnosis not present

## 2023-02-13 DIAGNOSIS — R278 Other lack of coordination: Secondary | ICD-10-CM | POA: Diagnosis not present

## 2023-02-13 DIAGNOSIS — R262 Difficulty in walking, not elsewhere classified: Secondary | ICD-10-CM

## 2023-02-13 DIAGNOSIS — R29898 Other symptoms and signs involving the musculoskeletal system: Secondary | ICD-10-CM

## 2023-02-13 DIAGNOSIS — I69354 Hemiplegia and hemiparesis following cerebral infarction affecting left non-dominant side: Secondary | ICD-10-CM

## 2023-02-13 DIAGNOSIS — I639 Cerebral infarction, unspecified: Secondary | ICD-10-CM | POA: Diagnosis not present

## 2023-02-13 LAB — COLOGUARD: COLOGUARD: NEGATIVE

## 2023-02-13 NOTE — Therapy (Signed)
OUTPATIENT OCCUPATIONAL THERAPY NEURO TREATMENT NOTE  Patient Name: Robert Lyons MRN: [id number] DOB:[birth year], 62 y.o., male Today's Date: 02/13/2023  PCP: [name], MD REFERRING PROVIDER: Roxan Hockey, MD  END OF SESSION:  OT End of Session - 02/13/23 1605     Visit Number 2    Number of Visits 9    Date for OT Re-Evaluation 03/17/23    Authorization Type Managed medicaid    OT Start Time 1605    OT Stop Time [id number]    OT Time Calculation (min) 40 min    Activity Tolerance Patient tolerated treatment well    Behavior During Therapy WFL for tasks assessed/performed             Past Medical History:  Diagnosis Date   GERD (gastroesophageal reflux disease)    Hypertension    Pneumonia    Type 2 diabetes mellitus ([place])    Past Surgical History:  Procedure Laterality Date   No prior surgery     Patient Active Problem List   Diagnosis Date Noted   Paratracheal lymphadenopathy 01/31/2023   Acute CVA (cerebrovascular accident) ([place]) 01/18/2023   Abdominal pain 01/18/2023   Chest pain 01/18/2023   Tobacco abuse 01/18/2023   Hypokalemia 01/18/2023   Vitamin D deficiency 01/05/2023   GERD (gastroesophageal reflux disease) 12/08/2022   Cataracts, both eyes 12/08/2022   Encounter for general adult medical examination with abnormal findings 12/08/2022   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[place]) [id number]   Essential hypertension 07/10/2017   Hyperlipidemia 07/10/2017   Cigarette nicotine dependence without complication [id number]    ONSET DATE: 01/18/23  REFERRING DIAG: R [place] CVA  THERAPY DIAG:  Other lack of coordination  Other symptoms and signs involving the nervous system  Hemiplegia and hemiparesis following cerebral infarction affecting left non-dominant side ([place])  Rationale for Evaluation and Treatment: Rehabilitation  SUBJECTIVE:   SUBJECTIVE STATEMENT: "I don't know how to feel some days." Pt accompanied  by: significant other  PERTINENT HISTORY: Patient states that everything on the L LE went to a "limp" since he had the stroke in January 2024. Also reports of numbness on the LEs. Patient was admitted to the hospital when he had a stroke on 01/18/2023.  PRECAUTIONS: None  WEIGHT BEARING RESTRICTIONS: No  PAIN:  Are you having pain? No  FALLS: Has patient fallen in last 6 months? No  LIVING ENVIRONMENT: Lives with: lives with an adult companion Lives in: House/apartment Stairs: Yes: Internal: Full flight steps; on right going up and External: 1 steps; none Has following equipment at home: Single point cane and shower chair  PLOF: Independent  PATIENT GOALS: To get strength back in my L side.   OBJECTIVE:   HAND DOMINANCE: Right  ADLs: Overall ADLs: Pt reports difficulty getting his clothes on, states he gets mixed up with it at times. Additionally due to strength and endurance he has a difficult time cooking and cleaning. Pt's partner assists with medication management.   MOBILITY STATUS: Independent  POSTURE COMMENTS:  No Significant postural limitations Sitting balance: Moves/returns truncal midpoint >2 inches in all planes  ACTIVITY TOLERANCE: Activity tolerance: Pt reports that his endurance has been affected, where he has difficulty sustaining activities due to getting out of breath and weak.   FUNCTIONAL OUTCOME MEASURES: Quick Dash: 45.45  UPPER EXTREMITY ROM:    Active ROM Left eval  Shoulder flexion 132  Shoulder abduction 87  Shoulder internal rotation 90  Shoulder external rotation 45  Elbow flexion 69  Elbow extension 3  Wrist flexion 30  Wrist extension -2  Wrist ulnar deviation 28  Wrist radial deviation -5  Wrist pronation WFL  Wrist supination 45  (Blank rows = not tested)  UPPER EXTREMITY MMT:     MMT Left eval  Shoulder flexion 4/5  Shoulder abduction 4-/5  Shoulder adduction 4/5  Shoulder extension 4+/5  Shoulder internal rotation  4-/5  Shoulder external rotation 4-/5  Elbow flexion 4/5  Elbow extension 4+/5  Wrist flexion 4/5  Wrist extension 3+/5  Wrist ulnar deviation 4/5  Wrist radial deviation 3/5  Wrist pronation 5/5  Wrist supination 4+/5  (Blank rows = not tested)  HAND FUNCTION: Grip strength: Right: 70 lbs; Left: 20 lbs, Lateral pinch: Right: 20 lbs, Left: 3 lbs, and 3 point pinch: Right: 10 lbs, Left: 3 lbs  COORDINATION: 9 Hole Peg test: Right: 33.74 sec; Left: 46.55 sec  SENSATION: Light touch: Impaired   EDEMA: Mild edema noted in the L hand  COGNITION: Overall cognitive status: Within functional limits for tasks assessed  VISION: Subjective report: Pt reports he has severe cataracts in both eyes. Was supposed to have surgery, but it got postponed.  Baseline vision: Wears glasses all the time Visual history: cataracts  VISION ASSESSMENT: Not tested  Patient has difficulty with following activities due to following visual impairments: Driving, reading  PERCEPTION: WFL  PRAXIS: WFL  OBSERVATIONS: At times pt demonstrates some word finding difficulties.    TODAY'S TREATMENT:                                                                                                                              DATE:   02/13/23: -Weightbearing:   -weight shifting: forwards and backwards x10, side to side x10  -low plank: 2x20"  -modified push ups x10 -Theraband strengthening: retraction, rows, bicep curls, tricep extensions, x10, red band -Theraputty: red putty: Roll into ball, flatten into pancake, use PVC to cut cookies x15, roll into ball, squeeze x10 -Pinch Strengthening: red, green, and blue resistance clips, lateral pinch up on vertical pole, tripod pinch down on horizontal pole -Gripper: 20lbs picking up 6 cubes and stacking them, 25lb full squeeze x10, picking up 6 cubes and stacking them   PATIENT EDUCATION: Education details: Weight bearing Person educated: Patient and  Spouse Education method: Explanation Education comprehension: verbalized understanding  HOME EXERCISE PROGRAM: 2/19: Weight bearing (reaching, weight shifting)   GOALS: Goals reviewed with patient? Yes  SHORT TERM GOALS: Target date: 03/17/23  Pt will be provided with and educated on HEP to improve mobility in LUE required for ADL completion.  Goal status: IN PROGRESS  2.  Pt will improve strength to 4+/5 in order to improve ability to perform lifting tasks during meal preparation and cleaning tasks.  Goal status: IN PROGRESS  3.  Pt will increase left grip strength by 10# and pinch strength by 3# to improve ability to grasp and  hold pots and pans during simple meal preparation.  Goal status: IN PROGRESS  4.  Pt will increase left hand coordination by completing 9 hole peg test in under 35 seconds, improving ability to manipulate lids and tops on jars, bottles, etc.  Goal status: IN PROGRESS  5.  Pt will improve LUE motor planning by completing functional reaching tasks with minimal compensatory assist from scapular and trapezius regions.  Goal status: IN PROGRESS  6. Pt will increase A/ROM of LUE by 40 degrees in all directions in order to reach overhead and behind back during dressing and bathing tasks.   Goal status: IN PROGRESS   ASSESSMENT:  CLINICAL IMPRESSION: Pt presenting this session with continued weakness and coordination deficits. This session he started with weightbearing for proprioceptive input through his left arm. Additionally, OT started pt on working on his grip strength and pinch strength. He continues to have more significant weakness on the LUE, however it is improving. Verbal and tactile cuing provided for positioning and technique throughout.    PERFORMANCE DEFICITS: in functional skills including ADLs, IADLs, coordination, sensation, ROM, strength, muscle spasms, Fine motor control, Gross motor control, body mechanics, vision, and UE functional use,  cognitive skills including attention, memory, problem solving, and safety awareness.   PLAN:  OT FREQUENCY: 2x/week  OT DURATION: 4 weeks  PLANNED INTERVENTIONS: self care/ADL training, therapeutic exercise, therapeutic activity, neuromuscular re-education, passive range of motion, functional mobility training, electrical stimulation, ultrasound, paraffin, moist heat, cryotherapy, patient/family education, cognitive remediation/compensation, energy conservation, and DME and/or AE instructions  RECOMMENDED OTHER SERVICES: PT and Speech for Cognition  CONSULTED AND AGREED WITH PLAN OF CARE: Patient  PLAN FOR NEXT SESSION: A/ROM, Weight bearing, Closed chain exercises, fine motor tasks   Paulita Fujita, OTR/L [place], [place] 02/13/2023, 4:07 PM

## 2023-02-13 NOTE — Therapy (Signed)
OUTPATIENT PHYSICAL THERAPY TREATMENT Patient Name: Robert Lyons MRN: [id number] DOB:02/27/1961, 62 y.o., male Today's Date: 02/13/2023   PCP: [name] MD  REFERRING PROVIDER: Roxan Hockey, MD  END OF SESSION:  PT End of Session - 02/13/23 1657     Visit Number 2    Number of Visits 16    Date for PT Re-Evaluation 04/07/23    Authorization Type [place] Medicaid [employer] (requested for 16 visits, please check)    Authorization Time Period 12 visits approved 2/16-4/16    Authorization - Visit Number 1    Authorization - Number of Visits 12    Progress Note Due on Visit 8    PT Start Time [id number]    PT Stop Time 1728    PT Time Calculation (min) 38 min    Equipment Utilized During Treatment Gait belt    Activity Tolerance Patient limited by fatigue    Behavior During Therapy WFL for tasks assessed/performed            Past Medical History:  Diagnosis Date   GERD (gastroesophageal reflux disease)    Hypertension    Pneumonia    Type 2 diabetes mellitus ([place])    Past Surgical History:  Procedure Laterality Date   No prior surgery     Patient Active Problem List   Diagnosis Date Noted   Paratracheal lymphadenopathy 01/31/2023   Acute CVA (cerebrovascular accident) ([place]) 01/18/2023   Abdominal pain 01/18/2023   Chest pain 01/18/2023   Tobacco abuse 01/18/2023   Hypokalemia 01/18/2023   Vitamin D deficiency 01/05/2023   GERD (gastroesophageal reflux disease) 12/08/2022   Cataracts, both eyes 12/08/2022   Encounter for general adult medical examination with abnormal findings 12/08/2022   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[place]) [id number]   Essential hypertension 07/10/2017   Hyperlipidemia 07/10/2017   Cigarette nicotine dependence without complication [id number]    ONSET DATE: 01/18/2023  REFERRING DIAG: I63.9 (ICD-10-CM) - Acute CVA (cerebrovascular accident) ([place])  THERAPY DIAG:  Weakness of left lower extremity  Difficulty  walking  Rationale for Evaluation and Treatment: Rehabilitation  SUBJECTIVE:                                                                                                                                                                                             SUBJECTIVE STATEMENT: Pt states he currently has pain of 5/10 in the anterior aspect of his Lt thigh.  Comes today ambulating with SPC.   Evaluation: Patient states that everything on the L LE went to a "limp" since he had the stroke in January 2024. Also reports  of numbness on the LEs. Patient was admitted to the hospital when he had a stroke in 01/18/2023 and was D/C the other night. Denies getting rehabilitation services while at the hospital but he was immediately referred to outpatient PT evaluation and management. Patient is also receiving outpatient OT services at this time.  Pt accompanied by: significant other  PERTINENT HISTORY: DM, HTN  PAIN:  Are you having pain? Yes: NPRS scale: 5-6/10 Pain location: L LE Pain description: sharp, intermittent Aggravating factors: comes and goes randomly Relieving factors: comes and goes randomly  PRECAUTIONS: None  WEIGHT BEARING RESTRICTIONS: No  FALLS: Has patient fallen in last 6 months? No  LIVING ENVIRONMENT: Lives with:  significant other Lives in: House/apartment Stairs: No Has following equipment at home: Single point cane and shower chair  PLOF: Independent  PATIENT GOALS: "to get my strength back"  OBJECTIVE:   DIAGNOSTIC FINDINGS:  MRI Brain without contrast 01/18/2023 IMPRESSION: 1. 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 2. No intracranial large vessel occlusion or significant stenosis.  COGNITION: Overall cognitive status: Within functional limits for tasks assessed   SENSATION: Patient reports that the L LE has can feel "more" than the R as to light touch  COORDINATION: Moderately impaired on the L LE as to heel-to-shin No difficulty  with alternating seated foot taps  MUSCLE LENGTH: Moderate tightness on B hamstrings and gastrocnemius  POSTURE: rounded shoulders, forward head, and L knee slightly flexed in standing  LOWER EXTREMITY ROM:     Active  Right Eval Left Eval  Hip flexion [facility]  Hip extension [facility]  Hip abduction [facility]  Hip adduction    Hip internal rotation    Hip external rotation    Knee flexion [facility] WFL  Knee extension [facility]  Ankle dorsiflexion [facility] WFL  Ankle plantarflexion [facility] WFL  Ankle inversion    Ankle eversion     (Blank rows = not tested)  LOWER EXTREMITY MMT:    MMT Right Eval Left Eval  Hip flexion 5 3+  Hip extension 4 3+  Hip abduction 4+ 3+  Hip adduction    Hip internal rotation    Hip external rotation    Knee flexion 5 4-  Knee extension 4 3+  Ankle dorsiflexion 5 3+  Ankle plantarflexion 5 3+  Ankle inversion    Ankle eversion    (Blank rows = not tested)  BED MOBILITY:  Sit to supine Complete Independence Supine to sit Complete Independence Rolling to Right Complete Independence Rolling to Left Complete Independence  TRANSFERS: Assistive device utilized: None  Sit to stand: SBA Stand to sit: SBA  GAIT: Gait pattern: decreased step length- Right, decreased stance time- Left, decreased stride length, decreased hip/knee flexion- Left, decreased ankle dorsiflexion- Left, Left hip hike, knee flexed in stance- Left, lateral lean- Right, narrow BOS, poor foot clearance- Right, and poor foot clearance- Left.  Distance walked: 207 ft Assistive device utilized: Single point cane Level of assistance: CGA Comments: moderately unsteady  FUNCTIONAL TESTS:  5 times sit to stand: 21.53 sec Timed up and go (TUG): 22.83 sec 2 minute walk test: 207 ft  TODAY'S TREATMENT:  DATE:  02/13/2023 Nustep level 2 5 minutes UE/LE seat  10 Standing:  heelraise 20X  Toeraise 20X  Hip abduction 20X each  Hip extension 20X each  Marching alternating with 5" holds 10X each  4" step up laterals 20X each Sit to stands 10X no UE from standard chair Long arc quad 20X5" holds each LE  02/10/23 Evaluation and patient education    PATIENT EDUCATION: Education details: Educated on the pathoanatomy of CVA. Educated on the goals and course of rehab. Education on falls reduction/prevention at home. Person educated: Patient and significant other Education method: Explanation Education comprehension: verbalized understanding  HOME EXERCISE PROGRAM: Access Code: [email or web address] URL: [email or web address] Date: 02/13/2023 Prepared by: Roseanne Reno Exercises - Seated Long Arc Quad  - 2 x daily - 7 x weekly - 1 sets - 20 reps - 5 sec hold - Sit to Stand  - 2 x daily - 7 x weekly - 1 sets - 10 reps - Standing Hip Abduction  - 2 x daily - 7 x weekly - 1 sets - 20 reps - Standing Hip Extension  - 2 x daily - 7 x weekly - 1 sets - 20 reps - Standing Hip Flexion  - 2 x daily - 7 x weekly - 1 sets - 20 reps - 5 sec hold   GOALS: Goals reviewed with patient? Yes  SHORT TERM GOALS: Target date: 03/11/2023  Pt will demonstrate indep in HEP to facilitate carry-over of skilled services and improve functional outcomes  Goal status: IN PROGRESS  LONG TERM GOALS: Target date: 04/07/2023  Pt will have a decrease in TUG score by at least 3 sec in order to demonstrate clinically significant improvement in community ambulation Baseline: 22.83 sec Goal status: IN PROGRESS  2.  Pt will decrease 5TSTS by at least 3 seconds in order to demonstrate clinically significant improvement in LE strength  Baseline: 21.53 sec Goal status: IN PROGRESS  3.  Pt will increase 2MWT by at least 40 ft in order to demonstrate clinically significant improvement in community ambulation  Baseline: 207 ft Goal status: IN PROGRESS  4.  Pt will  demonstrate increase in LE strength to 4+/5 to facilitate ease and safety in ambulation  Baseline: 3+/5 Goal status: IN PROGRESS  5.  Pt will be able to walk on level surfaces > 100 ft without an assistive device with little to no gait deviation Goal status: IN PROGRESS  ASSESSMENT:  CLINICAL IMPRESSION: Began with warm up on nustep and review of therapy goals.  Pt requires general postural cues and technique to isolate correct musculature.   PT tends to maintain downward gaze when standing.  Home exercise program initiated and given to patient to complete at home. Pt will continue to benefit from skilled physical therapy to address deficits and improve functional status.    OBJECTIVE IMPAIRMENTS: Abnormal gait, decreased balance, decreased endurance, decreased mobility, difficulty walking, decreased strength, and impaired flexibility.   ACTIVITY LIMITATIONS: lifting, bending, sitting, standing, squatting, stairs, transfers, and locomotion level  PARTICIPATION LIMITATIONS: meal prep, cleaning, laundry, driving, shopping, and community activity  PERSONAL FACTORS: Fitness are also affecting patient's functional outcome.   REHAB POTENTIAL: Good  CLINICAL DECISION MAKING: Stable/uncomplicated  EVALUATION COMPLEXITY: Low  PLAN:  PT FREQUENCY: 2x/week  PT DURATION: 8 weeks  PLANNED INTERVENTIONS: Therapeutic exercises, Therapeutic activity, Neuromuscular re-education, Balance training, Gait training, Patient/Family education, Self Care, Stair training, and Electrical stimulation  PLAN FOR NEXT SESSION: Provide HEP. May begin LE flexibility, strengthening, balance  and gait training. Begin static balance challenges and squats next session.   Teena Irani, PTA/CLT [place] Ph: [phone]  02/13/2023, 4:59 PM

## 2023-02-13 NOTE — Progress Notes (Signed)
   Care Guide Note  02/13/2023 Name: Robert Lyons MRN: [id number] DOB: 1961-06-07  Referred by: Robert Abraham, MD Reason for referral : Care Coordination (Outreach to schedule with Pharm d NEW MM DM )   Robert Lyons is a 62 y.o. year old male who is a primary care patient of Robert Abraham, MD. Robert Lyons was referred to the pharmacist for assistance related to DM.    Successful contact was made with the patient to discuss pharmacy services including being ready for the pharmacist to call at least 5 minutes before the scheduled appointment time, to have medication bottles and any blood sugar or blood pressure readings ready for review. The patient agreed to meet with the pharmacist via with the pharmacist via telephone visit on (date/time).  02/22/2023  Noreene Larsson, [place], [place] [address] Direct Dial: [phone] [email or web address]

## 2023-02-15 ENCOUNTER — Ambulatory Visit (HOSPITAL_COMMUNITY): Payer: Medicaid Other | Admitting: Occupational Therapy

## 2023-02-15 DIAGNOSIS — Z7689 Persons encountering health services in other specified circumstances: Secondary | ICD-10-CM | POA: Diagnosis not present

## 2023-02-16 ENCOUNTER — Inpatient Hospital Stay: Payer: Medicaid Other | Admitting: Licensed Clinical Social Worker

## 2023-02-16 ENCOUNTER — Ambulatory Visit: Payer: Medicaid Other | Admitting: Hematology

## 2023-02-16 DIAGNOSIS — R590 Localized enlarged lymph nodes: Secondary | ICD-10-CM

## 2023-02-16 NOTE — Progress Notes (Signed)
Center CSW Progress Note  Clinical Education officer, museum met with patient and his friend [name] to look at social security paperwork.  Pt confirmed he receives 255 in retirement benefits.  Pt states he was approved for Medicaid in December, but did not apply for SSI.  CSW called DSS w/ pt and friend to find out who his case manager is in order to apply.  Voice message left for case manager to contact pt to explore SSI.  Pt signed referral for the [employer] which was given to the financial resource specialist.  Pt did not have proof of income, but states he submitted this to [employer].  CSW contacted [name] who informed CSW proof of income has not yet been received but will be forwarded to [place] upon receipt to assist pt w/ applying for [employer].      [name], LCSW    Patient is participating in a Managed Medicaid Plan:  Yes

## 2023-02-20 ENCOUNTER — Ambulatory Visit (HOSPITAL_COMMUNITY): Payer: Medicaid Other | Admitting: Occupational Therapy

## 2023-02-20 ENCOUNTER — Encounter (HOSPITAL_COMMUNITY): Payer: Self-pay | Admitting: Occupational Therapy

## 2023-02-20 DIAGNOSIS — I639 Cerebral infarction, unspecified: Secondary | ICD-10-CM | POA: Diagnosis not present

## 2023-02-20 DIAGNOSIS — I69354 Hemiplegia and hemiparesis following cerebral infarction affecting left non-dominant side: Secondary | ICD-10-CM | POA: Diagnosis not present

## 2023-02-20 DIAGNOSIS — R29818 Other symptoms and signs involving the nervous system: Secondary | ICD-10-CM

## 2023-02-20 DIAGNOSIS — Z7689 Persons encountering health services in other specified circumstances: Secondary | ICD-10-CM | POA: Diagnosis not present

## 2023-02-20 DIAGNOSIS — R278 Other lack of coordination: Secondary | ICD-10-CM

## 2023-02-20 DIAGNOSIS — R262 Difficulty in walking, not elsewhere classified: Secondary | ICD-10-CM | POA: Diagnosis not present

## 2023-02-20 DIAGNOSIS — R29898 Other symptoms and signs involving the musculoskeletal system: Secondary | ICD-10-CM | POA: Diagnosis not present

## 2023-02-20 NOTE — Therapy (Signed)
OUTPATIENT OCCUPATIONAL THERAPY NEURO TREATMENT NOTE  Patient Name: Robert Lyons MRN: [id number] DOB:[birth year], 62 y.o., male Today's Date: 02/20/2023  PCP: [name], MD REFERRING PROVIDER: Roxan Hockey, MD  END OF SESSION:  OT End of Session - 02/20/23 1425     Visit Number 3    Number of Visits 9    Date for OT Re-Evaluation 03/17/23    Authorization Type Managed medicaid    Authorization Time Period 8 visits (02/10/23-04/01/23)    Authorization - Visit Number 2    Authorization - Number of Visits 8    OT Start Time 1430    OT Stop Time 1510    OT Time Calculation (min) 40 min    Activity Tolerance Patient tolerated treatment well    Behavior During Therapy WFL for tasks assessed/performed             Past Medical History:  Diagnosis Date   GERD (gastroesophageal reflux disease)    Hypertension    Pneumonia    Type 2 diabetes mellitus ([place])    Past Surgical History:  Procedure Laterality Date   No prior surgery     Patient Active Problem List   Diagnosis Date Noted   Paratracheal lymphadenopathy 01/31/2023   Acute CVA (cerebrovascular accident) ([place]) 01/18/2023   Abdominal pain 01/18/2023   Chest pain 01/18/2023   Tobacco abuse 01/18/2023   Hypokalemia 01/18/2023   Vitamin D deficiency 01/05/2023   GERD (gastroesophageal reflux disease) 12/08/2022   Cataracts, both eyes 12/08/2022   Encounter for general adult medical examination with abnormal findings 12/08/2022   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[place]) [id number]   Essential hypertension 07/10/2017   Hyperlipidemia 07/10/2017   Cigarette nicotine dependence without complication [id number]    ONSET DATE: 01/18/23  REFERRING DIAG: R [place] CVA  THERAPY DIAG:  Other symptoms and signs involving the nervous system  Other lack of coordination  Hemiplegia and hemiparesis following cerebral infarction affecting left non-dominant side ([place])  Rationale for  Evaluation and Treatment: Rehabilitation  SUBJECTIVE:   SUBJECTIVE STATEMENT: "I'm just tired and don't feel like working today." Pt accompanied by: significant other  PERTINENT HISTORY: Patient states that everything on the L LE went to a "limp" since he had the stroke in January 2024. Also reports of numbness on the LEs. Patient was admitted to the hospital when he had a stroke on 01/18/2023.  PRECAUTIONS: None  WEIGHT BEARING RESTRICTIONS: No  PAIN:  Are you having pain? No  FALLS: Has patient fallen in last 6 months? No  LIVING ENVIRONMENT: Lives with: lives with an adult companion Lives in: House/apartment Stairs: Yes: Internal: Full flight steps; on right going up and External: 1 steps; none Has following equipment at home: Single point cane and shower chair  PLOF: Independent  PATIENT GOALS: To get strength back in my L side.   OBJECTIVE:   HAND DOMINANCE: Right  ADLs: Overall ADLs: Pt reports difficulty getting his clothes on, states he gets mixed up with it at times. Additionally due to strength and endurance he has a difficult time cooking and cleaning. Pt's partner assists with medication management.   MOBILITY STATUS: Independent  POSTURE COMMENTS:  No Significant postural limitations Sitting balance: Moves/returns truncal midpoint >2 inches in all planes  ACTIVITY TOLERANCE: Activity tolerance: Pt reports that his endurance has been affected, where he has difficulty sustaining activities due to getting out of breath and weak.   FUNCTIONAL OUTCOME MEASURES: Quick Dash: 45.45  UPPER EXTREMITY  ROM:    Active ROM Left eval  Shoulder flexion 132  Shoulder abduction 87  Shoulder internal rotation 90  Shoulder external rotation 45  Elbow flexion 69  Elbow extension 3  Wrist flexion 30  Wrist extension -2  Wrist ulnar deviation 28  Wrist radial deviation -5  Wrist pronation WFL  Wrist supination 45  (Blank rows = not tested)  UPPER EXTREMITY MMT:      MMT Left eval  Shoulder flexion 4/5  Shoulder abduction 4-/5  Shoulder adduction 4/5  Shoulder extension 4+/5  Shoulder internal rotation 4-/5  Shoulder external rotation 4-/5  Elbow flexion 4/5  Elbow extension 4+/5  Wrist flexion 4/5  Wrist extension 3+/5  Wrist ulnar deviation 4/5  Wrist radial deviation 3/5  Wrist pronation 5/5  Wrist supination 4+/5  (Blank rows = not tested)  HAND FUNCTION: Grip strength: Right: 70 lbs; Left: 20 lbs, Lateral pinch: Right: 20 lbs, Left: 3 lbs, and 3 point pinch: Right: 10 lbs, Left: 3 lbs  COORDINATION: 9 Hole Peg test: Right: 33.74 sec; Left: 46.55 sec  SENSATION: Light touch: Impaired   EDEMA: Mild edema noted in the L hand  COGNITION: Overall cognitive status: Within functional limits for tasks assessed  VISION: Subjective report: Pt reports he has severe cataracts in both eyes. Was supposed to have surgery, but it got postponed.  Baseline vision: Wears glasses all the time Visual history: cataracts  VISION ASSESSMENT: Not tested  Patient has difficulty with following activities due to following visual impairments: Driving, reading  PERCEPTION: WFL  PRAXIS: WFL  OBSERVATIONS: At times pt demonstrates some word finding difficulties.    TODAY'S TREATMENT:                                                                                                                              DATE:   02/20/23 -Scapula Strengthening: green band, extension, retractions, rows, x10 -Shoulder Strengthening: green band, flexion, abduction, chest pulls, er pulls, x10 -Theraputty: red putty, roll into a ball, pinch and pull to find 10 beads -Cards: pushing out with thumb x10, flipping x10, shuffling x10 -Coins: flipping them over, holding 10 coins and pushing them up to D1 and D2 to place them in piggy bank, repeated with 15 coins of different sizes (penny, nickel, dime, quarter)  02/13/23: -Weightbearing:   -weight shifting: forwards  and backwards x10, side to side x10  -low plank: 2x20"  -modified push ups x10 -Theraband strengthening: retraction, rows, bicep curls, tricep extensions, x10, red band -Theraputty: red putty: Roll into ball, flatten into pancake, use PVC to cut cookies x15, roll into ball, squeeze x10 -Pinch Strengthening: red, green, and blue resistance clips, lateral pinch up on vertical pole, tripod pinch down on horizontal pole -Gripper: 20lbs picking up 6 cubes and stacking them, 25lb full squeeze x10, picking up 6 cubes and stacking them   PATIENT EDUCATION: Education details: Coins and card tasks Person educated: Patient and Spouse Education  method: Explanation Education comprehension: verbalized understanding  HOME EXERCISE PROGRAM: 2/19: Weight bearing (reaching, weight shifting) 2/26: Coins and card tasks   GOALS: Goals reviewed with patient? Yes  SHORT TERM GOALS: Target date: 03/17/23  Pt will be provided with and educated on HEP to improve mobility in LUE required for ADL completion.  Goal status: IN PROGRESS  2.  Pt will improve strength to 4+/5 in order to improve ability to perform lifting tasks during meal preparation and cleaning tasks.  Goal status: IN PROGRESS  3.  Pt will increase left grip strength by 10# and pinch strength by 3# to improve ability to grasp and hold pots and pans during simple meal preparation.  Goal status: IN PROGRESS  4.  Pt will increase left hand coordination by completing 9 hole peg test in under 35 seconds, improving ability to manipulate lids and tops on jars, bottles, etc.  Goal status: IN PROGRESS  5.  Pt will improve LUE motor planning by completing functional reaching tasks with minimal compensatory assist from scapular and trapezius regions.  Goal status: IN PROGRESS  6. Pt will increase A/ROM of LUE by 40 degrees in all directions in order to reach overhead and behind back during dressing and bathing tasks.   Goal status: IN  PROGRESS   ASSESSMENT:  CLINICAL IMPRESSION: This session pt started working on overall strengthening with the LUE. He is able to tolerate green theraband shoulder exercises, with short rest breaks after each exercises due to muscle fatigue. Pt then working on sensation and hand strength via theraputty. Due to poor sensation, pt had difficulty finding the beads, however with increased time and cuing he was able to find all 10. OT started pt working on fine motor tasks this session, using a deck of cards and coins. He had mild difficulty with the cards and increased time when handling a handful of coins, however he was able to successfully complete all tasks with increased time. OT providing verbal and tactile cuing throughout for positioning, technique, and attention to tasks.   PERFORMANCE DEFICITS: in functional skills including ADLs, IADLs, coordination, sensation, ROM, strength, muscle spasms, Fine motor control, Gross motor control, body mechanics, vision, and UE functional use, cognitive skills including attention, memory, problem solving, and safety awareness.   PLAN:  OT FREQUENCY: 2x/week  OT DURATION: 4 weeks  PLANNED INTERVENTIONS: self care/ADL training, therapeutic exercise, therapeutic activity, neuromuscular re-education, passive range of motion, functional mobility training, electrical stimulation, ultrasound, paraffin, moist heat, cryotherapy, patient/family education, cognitive remediation/compensation, energy conservation, and DME and/or AE instructions  RECOMMENDED OTHER SERVICES: PT and Speech for Cognition  CONSULTED AND AGREED WITH PLAN OF CARE: Patient  PLAN FOR NEXT SESSION: A/ROM, Weight bearing, Closed chain exercises, fine motor tasks   Paulita Fujita, OTR/L [place], [place] 02/20/2023, 2:26 PM

## 2023-02-21 ENCOUNTER — Encounter: Payer: Self-pay | Admitting: Internal Medicine

## 2023-02-21 ENCOUNTER — Inpatient Hospital Stay: Payer: Medicaid Other | Admitting: Licensed Clinical Social Worker

## 2023-02-21 DIAGNOSIS — R590 Localized enlarged lymph nodes: Secondary | ICD-10-CM

## 2023-02-21 NOTE — Progress Notes (Signed)
[facility] CSW Progress Note  Clinical Education officer, museum  received fax from [employer] for pt's proof of income.  Fax scanned and emailed to Lendell Caprice to apply for the [employer] as well as Nicanor Bake at the [employer].      [name], LCSW    Patient is participating in a Managed Medicaid Plan:  Yes

## 2023-02-22 ENCOUNTER — Other Ambulatory Visit: Payer: Medicaid Other | Admitting: Pharmacist

## 2023-02-22 ENCOUNTER — Encounter: Payer: Self-pay | Admitting: Pharmacist

## 2023-02-22 ENCOUNTER — Ambulatory Visit (HOSPITAL_COMMUNITY): Payer: Medicaid Other | Admitting: Occupational Therapy

## 2023-02-22 ENCOUNTER — Other Ambulatory Visit: Payer: Self-pay | Admitting: Otolaryngology

## 2023-02-22 ENCOUNTER — Other Ambulatory Visit: Payer: Self-pay

## 2023-02-22 ENCOUNTER — Ambulatory Visit (HOSPITAL_COMMUNITY): Payer: Medicaid Other | Admitting: Physical Therapy

## 2023-02-22 ENCOUNTER — Encounter (HOSPITAL_COMMUNITY): Payer: Self-pay | Admitting: Occupational Therapy

## 2023-02-22 VITALS — BP 138/70 | HR 65

## 2023-02-22 DIAGNOSIS — R590 Localized enlarged lymph nodes: Secondary | ICD-10-CM

## 2023-02-22 DIAGNOSIS — Z7689 Persons encountering health services in other specified circumstances: Secondary | ICD-10-CM | POA: Diagnosis not present

## 2023-02-22 DIAGNOSIS — R278 Other lack of coordination: Secondary | ICD-10-CM | POA: Diagnosis not present

## 2023-02-22 DIAGNOSIS — I639 Cerebral infarction, unspecified: Secondary | ICD-10-CM | POA: Diagnosis not present

## 2023-02-22 DIAGNOSIS — R29898 Other symptoms and signs involving the musculoskeletal system: Secondary | ICD-10-CM | POA: Diagnosis not present

## 2023-02-22 DIAGNOSIS — I69354 Hemiplegia and hemiparesis following cerebral infarction affecting left non-dominant side: Secondary | ICD-10-CM

## 2023-02-22 DIAGNOSIS — R262 Difficulty in walking, not elsewhere classified: Secondary | ICD-10-CM | POA: Diagnosis not present

## 2023-02-22 DIAGNOSIS — R29818 Other symptoms and signs involving the nervous system: Secondary | ICD-10-CM

## 2023-02-22 DIAGNOSIS — E1136 Type 2 diabetes mellitus with diabetic cataract: Secondary | ICD-10-CM

## 2023-02-22 DIAGNOSIS — R591 Generalized enlarged lymph nodes: Secondary | ICD-10-CM

## 2023-02-22 MED ORDER — ACCU-CHEK GUIDE VI STRP: ORAL_STRIP | 12 refills | Status: DC

## 2023-02-22 MED ORDER — ACCU-CHEK MULTICLIX LANCETS MISC: 12 refills | Status: DC

## 2023-02-22 NOTE — Progress Notes (Signed)
Messaged received from Dr. Katheran James RN with [facility] stating that Dr. Katheran James recommendation is for a repeat CT scan of the neck in May and that he has a low concern for malignancy so he would defer biopsy until after repeat CT scan. The above recommendation has been discussed with Dr. Delton Coombes who is agreeable. Patient made aware of the recommendation. Schedule pending.

## 2023-02-22 NOTE — Therapy (Signed)
OUTPATIENT PHYSICAL THERAPY TREATMENT Patient Name: Robert Lyons MRN: [id number] DOB:04-24-1961, 62 y.o., male Today's Date: 02/22/2023   PCP: [name] MD  REFERRING PROVIDER: Roxan Hockey, MD  END OF SESSION:   Past Medical History:  Diagnosis Date   GERD (gastroesophageal reflux disease)    Hypertension    Pneumonia    Type 2 diabetes mellitus ([place])    Past Surgical History:  Procedure Laterality Date   No prior surgery     Patient Active Problem List   Diagnosis Date Noted   Paratracheal lymphadenopathy 01/31/2023   Acute CVA (cerebrovascular accident) ([place]) 01/18/2023   Abdominal pain 01/18/2023   Chest pain 01/18/2023   Tobacco abuse 01/18/2023   Hypokalemia 01/18/2023   Vitamin D deficiency 01/05/2023   GERD (gastroesophageal reflux disease) 12/08/2022   Cataracts, both eyes 12/08/2022   Encounter for general adult medical examination with abnormal findings 12/08/2022   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[place]) [id number]   Essential hypertension 07/10/2017   Hyperlipidemia 07/10/2017   Cigarette nicotine dependence without complication [id number]    ONSET DATE: 01/18/2023  REFERRING DIAG: I63.9 (ICD-10-CM) - Acute CVA (cerebrovascular accident) ([place])  THERAPY DIAG:  Weakness of left lower extremity  Difficulty walking  Rationale for Evaluation and Treatment: Rehabilitation  SUBJECTIVE:                                                                                                                                                                                             SUBJECTIVE STATEMENT: Pt just finishing up with OT, no pain or issues currently other than his Lt LE pain that comes and goes. Continues to ambulate with SPC.   Evaluation: Patient states that everything on the L LE went to a "limp" since he had the stroke in January 2024. Also reports of numbness on the LEs. Patient was admitted to the hospital when  he had a stroke in 01/18/2023 and was D/C the other night. Denies getting rehabilitation services while at the hospital but he was immediately referred to outpatient PT evaluation and management. Patient is also receiving outpatient OT services at this time.  Pt accompanied by: significant other  PERTINENT HISTORY: DM, HTN  PAIN:  Are you having pain? Yes: NPRS scale: 4/10 Pain location: L LE Pain description: sharp, intermittent Aggravating factors: comes and goes randomly Relieving factors: comes and goes randomly  PRECAUTIONS: None  WEIGHT BEARING RESTRICTIONS: No  FALLS: Has patient fallen in last 6 months? No  LIVING ENVIRONMENT: Lives with:  significant other Lives in: House/apartment Stairs: No Has following equipment at home: Single point cane and shower  chair  PLOF: Independent  PATIENT GOALS: "to get my strength back"  OBJECTIVE:   DIAGNOSTIC FINDINGS:  MRI Brain without contrast 01/18/2023 IMPRESSION: 1. 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 2. No intracranial large vessel occlusion or significant stenosis.  COGNITION: Overall cognitive status: Within functional limits for tasks assessed   SENSATION: Patient reports that the L LE has can feel "more" than the R as to light touch  COORDINATION: Moderately impaired on the L LE as to heel-to-shin No difficulty with alternating seated foot taps  MUSCLE LENGTH: Moderate tightness on B hamstrings and gastrocnemius  POSTURE: rounded shoulders, forward head, and L knee slightly flexed in standing  LOWER EXTREMITY ROM:     Active  Right Eval Left Eval  Hip flexion [facility]  Hip extension [facility]  Hip abduction [facility]  Hip adduction    Hip internal rotation    Hip external rotation    Knee flexion [facility] WFL  Knee extension [facility]  Ankle dorsiflexion [facility] WFL  Ankle plantarflexion [facility] WFL  Ankle inversion    Ankle eversion     (Blank rows = not tested)  LOWER EXTREMITY MMT:    MMT  Right Eval Left Eval  Hip flexion 5 3+  Hip extension 4 3+  Hip abduction 4+ 3+  Hip adduction    Hip internal rotation    Hip external rotation    Knee flexion 5 4-  Knee extension 4 3+  Ankle dorsiflexion 5 3+  Ankle plantarflexion 5 3+  Ankle inversion    Ankle eversion    (Blank rows = not tested)  BED MOBILITY:  Sit to supine Complete Independence Supine to sit Complete Independence Rolling to Right Complete Independence Rolling to Left Complete Independence  TRANSFERS: Assistive device utilized: None  Sit to stand: SBA Stand to sit: SBA  GAIT: Gait pattern: decreased step length- Right, decreased stance time- Left, decreased stride length, decreased hip/knee flexion- Left, decreased ankle dorsiflexion- Left, Left hip hike, knee flexed in stance- Left, lateral lean- Right, narrow BOS, poor foot clearance- Right, and poor foot clearance- Left.  Distance walked: 207 ft Assistive device utilized: Single point cane Level of assistance: CGA Comments: moderately unsteady  FUNCTIONAL TESTS:  5 times sit to stand: 21.53 sec Timed up and go (TUG): 22.83 sec 2 minute walk test: 207 ft  TODAY'S TREATMENT:                                                                                                                              DATE:  02/22/2023 Nustep level 2 5 minutes UE/LE seat 10 Standing:  heelraise 20X  Hip abduction 20X each  Hip extension 20X each  Marching alternating with 5" holds 10X each  4" step up laterals 20X each  4" lunge with 1 UE assist 10X each  Tandem stance 30" each lead  Vector 10X5" with 1 UE assist  Squats 10X Sit  to stands 10X no UE from standard chair Long arc quad 20X5" holds each LE  02/13/2023 Nustep level 2 5 minutes UE/LE seat 10 Standing:  heelraise 20X  Toeraise 20X  Hip abduction 20X each  Hip extension 20X each  Marching alternating with 5" holds 10X each  4" step up laterals 20X each Sit to stands 10X no UE from standard  chair Long arc quad 20X5" holds each LE  02/10/23 Evaluation and patient education    PATIENT EDUCATION: Education details: Educated on the pathoanatomy of CVA. Educated on the goals and course of rehab. Education on falls reduction/prevention at home. Person educated: Patient and significant other Education method: Explanation Education comprehension: verbalized understanding  HOME EXERCISE PROGRAM: Access Code: [email or web address] URL: [email or web address] Date: 02/13/2023 Prepared by: Roseanne Reno Exercises - Seated Long Arc Quad  - 2 x daily - 7 x weekly - 1 sets - 20 reps - 5 sec hold - Sit to Stand  - 2 x daily - 7 x weekly - 1 sets - 10 reps - Standing Hip Abduction  - 2 x daily - 7 x weekly - 1 sets - 20 reps - Standing Hip Extension  - 2 x daily - 7 x weekly - 1 sets - 20 reps - Standing Hip Flexion  - 2 x daily - 7 x weekly - 1 sets - 20 reps - 5 sec hold   GOALS: Goals reviewed with patient? Yes  SHORT TERM GOALS: Target date: 03/11/2023  Pt will demonstrate indep in HEP to facilitate carry-over of skilled services and improve functional outcomes  Goal status: IN PROGRESS  LONG TERM GOALS: Target date: 04/07/2023  Pt will have a decrease in TUG score by at least 3 sec in order to demonstrate clinically significant improvement in community ambulation Baseline: 22.83 sec Goal status: IN PROGRESS  2.  Pt will decrease 5TSTS by at least 3 seconds in order to demonstrate clinically significant improvement in LE strength  Baseline: 21.53 sec Goal status: IN PROGRESS  3.  Pt will increase 2MWT by at least 40 ft in order to demonstrate clinically significant improvement in community ambulation  Baseline: 207 ft Goal status: IN PROGRESS  4.  Pt will demonstrate increase in LE strength to 4+/5 to facilitate ease and safety in ambulation  Baseline: 3+/5 Goal status: IN PROGRESS  5.  Pt will be able to walk on level surfaces > 100 ft without an assistive device  with little to no gait deviation Goal status: IN PROGRESS  ASSESSMENT:  CLINICAL IMPRESSION: Pt unable to recall his HEP and doesn't know where he put his copies.  New exercises printed for patient and reviewed these with him today.  Pt still requires moderate amount of cues for general form, hold times with therex and to isolate correct musculature.  Improved forward gaze with less cues needed. Progressed static balance challenge with addition of vectors.  Unable to maintain greater than 5" with either LE leading requiring UE assist.  Vectors and lunges added to work on LE stability. Pt did hold his knee several times through session with some discomfort reported, however did not require any rest breaks this session. Pt will continue to benefit from skilled physical therapy to address deficits and improve functional status.    OBJECTIVE IMPAIRMENTS: Abnormal gait, decreased balance, decreased endurance, decreased mobility, difficulty walking, decreased strength, and impaired flexibility.   ACTIVITY LIMITATIONS: lifting, bending, sitting, standing, squatting, stairs, transfers, and locomotion level  PARTICIPATION LIMITATIONS: meal prep, cleaning,  laundry, driving, shopping, and community activity  PERSONAL FACTORS: Fitness are also affecting patient's functional outcome.   REHAB POTENTIAL: Good  CLINICAL DECISION MAKING: Stable/uncomplicated  EVALUATION COMPLEXITY: Low  PLAN:  PT FREQUENCY: 2x/week  PT DURATION: 8 weeks  PLANNED INTERVENTIONS: Therapeutic exercises, Therapeutic activity, Neuromuscular re-education, Balance training, Gait training, Patient/Family education, Self Care, Stair training, and Electrical stimulation  PLAN FOR NEXT SESSION:  Progress strengthening, balance and gait training.  Teena Irani, PTA/CLT [place] Ph: [phone]  02/22/2023, 3:51 PM

## 2023-02-22 NOTE — Therapy (Signed)
OUTPATIENT OCCUPATIONAL THERAPY NEURO TREATMENT NOTE  Patient Name: Robert Lyons MRN: [id number] DOB:[birth year], 62 y.o., male Today's Date: 02/22/2023  PCP: [name], MD REFERRING PROVIDER: Roxan Hockey, MD  END OF SESSION:  OT End of Session - 02/22/23 1514     Visit Number 4    Number of Visits 9    Date for OT Re-Evaluation 03/17/23    Authorization Type Managed medicaid    Authorization Time Period 8 visits (02/10/23-04/01/23)    Authorization - Visit Number 3    Authorization - Number of Visits 8    OT Start Time [id number]    OT Stop Time 1552    OT Time Calculation (min) 45 min    Activity Tolerance Patient tolerated treatment well    Behavior During Therapy WFL for tasks assessed/performed             Past Medical History:  Diagnosis Date   GERD (gastroesophageal reflux disease)    Hypertension    Pneumonia    Type 2 diabetes mellitus ([place])    Past Surgical History:  Procedure Laterality Date   No prior surgery     Patient Active Problem List   Diagnosis Date Noted   Paratracheal lymphadenopathy 01/31/2023   Acute CVA (cerebrovascular accident) ([place]) 01/18/2023   Abdominal pain 01/18/2023   Chest pain 01/18/2023   Tobacco abuse 01/18/2023   Hypokalemia 01/18/2023   Vitamin D deficiency 01/05/2023   GERD (gastroesophageal reflux disease) 12/08/2022   Cataracts, both eyes 12/08/2022   Encounter for general adult medical examination with abnormal findings 12/08/2022   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[place]) [id number]   Essential hypertension 07/10/2017   Hyperlipidemia 07/10/2017   Cigarette nicotine dependence without complication [id number]    ONSET DATE: 01/18/23  REFERRING DIAG: [place] CVA  THERAPY DIAG:  Other symptoms and signs involving the nervous system  Other lack of coordination  Hemiplegia and hemiparesis following cerebral infarction affecting left non-dominant side ([place])  Rationale for  Evaluation and Treatment: Rehabilitation  SUBJECTIVE:   SUBJECTIVE STATEMENT: "Things are still heavy feeling" Pt accompanied by: significant other  PERTINENT HISTORY: Patient states that everything on the L LE went to a "limp" since he had the stroke in January 2024. Also reports of numbness on the LEs. Patient was admitted to the hospital when he had a stroke on 01/18/2023.  PRECAUTIONS: None  WEIGHT BEARING RESTRICTIONS: No  PAIN:  Are you having pain? No  FALLS: Has patient fallen in last 6 months? No  PATIENT GOALS: To get strength back in my L side.   OBJECTIVE:   HAND DOMINANCE: Right  ADLs: Overall ADLs: Pt reports difficulty getting his clothes on, states he gets mixed up with it at times. Additionally due to strength and endurance he has a difficult time cooking and cleaning. Pt's partner assists with medication management.   MOBILITY STATUS: Independent  POSTURE COMMENTS:  No Significant postural limitations Sitting balance: Moves/returns truncal midpoint >2 inches in all planes  ACTIVITY TOLERANCE: Activity tolerance: Pt reports that his endurance has been affected, where he has difficulty sustaining activities due to getting out of breath and weak.   FUNCTIONAL OUTCOME MEASURES: Quick Dash: 45.45  UPPER EXTREMITY ROM:    Active ROM Left eval  Shoulder flexion 132  Shoulder abduction 87  Shoulder internal rotation 90  Shoulder external rotation 45  Elbow flexion 69  Elbow extension 3  Wrist flexion 30  Wrist extension -2  Wrist ulnar deviation  28  Wrist radial deviation -5  Wrist pronation WFL  Wrist supination 45  (Blank rows = not tested)  UPPER EXTREMITY MMT:     MMT Left eval  Shoulder flexion 4/5  Shoulder abduction 4-/5  Shoulder adduction 4/5  Shoulder extension 4+/5  Shoulder internal rotation 4-/5  Shoulder external rotation 4-/5  Elbow flexion 4/5  Elbow extension 4+/5  Wrist flexion 4/5  Wrist extension 3+/5  Wrist ulnar  deviation 4/5  Wrist radial deviation 3/5  Wrist pronation 5/5  Wrist supination 4+/5  (Blank rows = not tested)  HAND FUNCTION: Grip strength: Right: 70 lbs; Left: 20 lbs, Lateral pinch: Right: 20 lbs, Left: 3 lbs, and 3 point pinch: Right: 10 lbs, Left: 3 lbs  COORDINATION: 9 Hole Peg test: Right: 33.74 sec; Left: 46.55 sec  SENSATION: Light touch: Impaired   EDEMA: Mild edema noted in the L hand  COGNITION: Overall cognitive status: Within functional limits for tasks assessed  VISION: Subjective report: Pt reports he has severe cataracts in both eyes. Was supposed to have surgery, but it got postponed.  Baseline vision: Wears glasses all the time Visual history: cataracts  VISION ASSESSMENT: Not tested  Patient has difficulty with following activities due to following visual impairments: Driving, reading  PERCEPTION: WFL  PRAXIS: WFL  OBSERVATIONS: At times pt demonstrates some word finding difficulties.    TODAY'S TREATMENT:                                                                                                                              DATE:   02/22/23 -Weight bearing:  -weight shifting: forwards and backwards x10, side to side x10  -Reaching with RUE to pick up and stack 5 cones, repeat on the LUE  -Modified push ups x10 -Shoulder Strengthening: 2lb dumbbell, flexion, abduction, protraction, horizontal abduction, er/IR, x10 -Bicep curls, 2x10, 3lb dumbbells -Wrist Strengthening: 2lb dumbbell, flexion, extension, ulnar/radial deviation, supination, pronation, x10 -Pinch Strengthening: Red, green, and blue resistance clips, x5 each, lateral pinch up, tripod pinch down -Gripper: 32lbs picking up 6 medium beads, 35lbs picking up 6 medium beads, 42lbs picking up 6 medium beads and squeezing x10 -Scapula Strengthening: green theraband, extension, rows, retraction, x10  02/20/23 -Scapula Strengthening: green band, extension, retractions, rows,  x10 -Shoulder Strengthening: green band, flexion, abduction, chest pulls, er pulls, x10 -Theraputty: red putty, roll into a ball, pinch and pull to find 10 beads -Cards: pushing out with thumb x10, flipping x10, shuffling x10 -Coins: flipping them over, holding 10 coins and pushing them up to D1 and D2 to place them in piggy bank, repeated with 15 coins of different sizes (penny, nickel, dime, quarter)  02/13/23: -Weightbearing:   -weight shifting: forwards and backwards x10, side to side x10  -low plank: 2x20"  -modified push ups x10 -Theraband strengthening: retraction, rows, bicep curls, tricep extensions, x10, red band -Theraputty: red putty: Roll into ball, flatten into pancake, use PVC to cut cookies  x15, roll into ball, squeeze x10 -Pinch Strengthening: red, green, and blue resistance clips, lateral pinch up on vertical pole, tripod pinch down on horizontal pole -Gripper: 20lbs picking up 6 cubes and stacking them, 25lb full squeeze x10, picking up 6 cubes and stacking them   PATIENT EDUCATION: Education details: Manufacturing engineer Person educated: Patient and Spouse Education method: Explanation Education comprehension: verbalized understanding  HOME EXERCISE PROGRAM: 2/19: Weight bearing (reaching, weight shifting) 2/26: Coins and card tasks 2/28: Wrist Strengthening   GOALS: Goals reviewed with patient? Yes  SHORT TERM GOALS: Target date: 03/17/23  Pt will be provided with and educated on HEP to improve mobility in LUE required for ADL completion.  Goal status: IN PROGRESS  2.  Pt will improve strength to 4+/5 in order to improve ability to perform lifting tasks during meal preparation and cleaning tasks.  Goal status: IN PROGRESS  3.  Pt will increase left grip strength by 10# and pinch strength by 3# to improve ability to grasp and hold pots and pans during simple meal preparation.  Goal status: IN PROGRESS  4.  Pt will increase left hand coordination by  completing 9 hole peg test in under 35 seconds, improving ability to manipulate lids and tops on jars, bottles, etc.  Goal status: IN PROGRESS  5.  Pt will improve LUE motor planning by completing functional reaching tasks with minimal compensatory assist from scapular and trapezius regions.  Goal status: IN PROGRESS  6. Pt will increase A/ROM of LUE by 40 degrees in all directions in order to reach overhead and behind back during dressing and bathing tasks.   Goal status: IN PROGRESS   ASSESSMENT:  CLINICAL IMPRESSION: This session pt working on overall strengthening from his shoulders and scapula down to his wrist and grip/pinch strength. Pt continuing to work on weight bearing and placing increased amounts of his body weight through his LUE for proprioceptive input and strengthening. Additionally, pt demonstrating improving grip strength, as he was able to squeeze and effectively pick up 6 medium beads with 42lbs of resistance. He did require multiple short rest breaks during the session due to fatigue, however was able to recover quickly and continue with exercises. OT providing verbal and tactile cuing for positioning and technique throughout session.   PERFORMANCE DEFICITS: in functional skills including ADLs, IADLs, coordination, sensation, ROM, strength, muscle spasms, Fine motor control, Gross motor control, body mechanics, vision, and UE functional use, cognitive skills including attention, memory, problem solving, and safety awareness.   PLAN:  OT FREQUENCY: 2x/week  OT DURATION: 4 weeks  PLANNED INTERVENTIONS: self care/ADL training, therapeutic exercise, therapeutic activity, neuromuscular re-education, passive range of motion, functional mobility training, electrical stimulation, ultrasound, paraffin, moist heat, cryotherapy, patient/family education, cognitive remediation/compensation, energy conservation, and DME and/or AE instructions  RECOMMENDED OTHER SERVICES: PT and  Speech for Cognition  CONSULTED AND AGREED WITH PLAN OF CARE: Patient  PLAN FOR NEXT SESSION: A/ROM, Weight bearing, Closed chain exercises, fine motor tasks, strengthening tasks.    Paulita Fujita, OTR/L [facility] [place], [place] 02/22/2023, 3:16 PM

## 2023-02-22 NOTE — Progress Notes (Signed)
02/22/2023 Name: Robert Lyons MRN: [id number] DOB: Sep 27, 1961  Diabetes   Robert Lyons is a 62 y.o. year old male who presented for a telephone visit.   They were referred to the pharmacist by their PCP for assistance in managing diabetes and hypertension.  Patient was recently started on Ozempic (now on week 3) and tolerating well.  We will review his medications and management.  Patient is participating in a Managed Medicaid Plan:  Yes  Subjective:  Care Team: Primary Care Provider: [name], MD ; Next Scheduled Visit: 03/02/23  Medication Access/Adherence  Current Pharmacy:  [facility], [place] [address] Phone: [phone] Fax: [phone]   Patient reports affordability concerns with their medications: No  Patient reports access/transportation concerns to their pharmacy: No  Patient reports adherence concerns with their medications:  No    Diabetes:  Current medications: Ozempic 0.'25mg'$  weekly, metformin '850mg'$  BID Medications tried in the past: glipizide, sitagliptin  Current glucose readings: FBG up in the low 200s Using accuchek GUIDE meter; testing 2 times daily  -He would greatly benefit from a continuous glucose monitoring system (I.e. libre or dexcom)--however not covered on Medicaid unless on insulin  Patient denies hypoglycemic s/sx  Patient denies hyperglycemic symptoms   Current meal patterns:  Discussed meal planning options and Plate method for healthy eating Avoid sugary drinks and desserts Incorporate balanced protein, non starchy veggies, 1 serving of carbohydrate with each meal Increase water intake Increase physical activity as able  Current physical activity: encouraged  Current medication access support: medicaid  Hypertension:  Current medications: metop, lisinopril, amlodipine Medications previously tried: n/a  Patient does not have a validated,  automated, upper arm home BP cuff Current blood pressure readings readings: <140/90  Patient denies hypotensive s/sx including dizziness, lightheadedness.  Patient denies hypertensive symptoms including headache, chest pain, shortness of breath  Objective:  Lab Results  Component Value Date   HGBA1C 8.7 (H) 12/02/2022    Lab Results  Component Value Date   CREATININE 1.29 (H) 01/24/2023   BUN 19 01/24/2023   NA 138 01/24/2023   K 4.2 01/24/2023   CL 103 01/24/2023   CO2 22 01/24/2023    Lab Results  Component Value Date   CHOL 81 01/19/2023   HDL 34 (L) 01/19/2023   LDLCALC 29 01/19/2023   TRIG 89 01/19/2023   CHOLHDL 2.4 01/19/2023    Medications Reviewed Today     Reviewed by Marliss Coots, OT (Occupational Therapist) on 02/20/23 at 1425  Med List Status: <None>   Medication Order Taking? Sig Documenting Provider Last Dose Status Informant  acetaminophen (TYLENOL) 500 MG tablet [id number] No Take 1,000 mg by mouth every 6 (six) hours as needed. [provider] Taking Active Self, Pharmacy Records, Spouse/Significant Other  amLODipine (NORVASC) 5 MG tablet [id number] No Take 1 tablet (5 mg total) by mouth daily. Roxan Hockey, MD Taking Active Self, Pharmacy Records, Spouse/Significant Other  aspirin EC 81 MG tablet [id number] No Take 1 tablet (81 mg total) by mouth daily with breakfast. Please take Aspirin 81 mg daily along with Plavix 75 mg daily for 21 days then after that STOP the Plavix  and continue ONLY Aspirin 81 mg daily indefinitely--for secondary stroke Prevention Roxan Hockey, MD Taking Active Self, Pharmacy Records, Spouse/Significant Other  atorvastatin (LIPITOR) 80 MG tablet [id number] No Take 1 tablet (80 mg total) by mouth daily. Roxan Hockey, MD Taking Active Self,  Pharmacy Records, Spouse/Significant Other  Blood Glucose Monitoring Suppl [facility] ULTRALINK) w/Device KIT [id number] No 1 each by Does not apply route as needed. [name], MD Taking Active Self, Pharmacy Records, Spouse/Significant Other  clopidogrel (PLAVIX) 75 MG tablet [id number] No Take 1 tablet (75 mg total) by mouth daily. Please take Aspirin 81 mg daily along with Plavix 75 mg daily for 21 days then after that STOP the Plavix  and continue ONLY Aspirin 81 mg daily indefinitely--for secondary stroke Prevention Roxan Hockey, MD Taking Active Self, Pharmacy Records, Spouse/Significant Other  glucose blood (ONETOUCH ULTRA) test strip [id number]  Use as instructed [name], MD  Active   Lancets [facility] ULTRASOFT) lancets [id number] No Use as instructed once daily dx e11.9 [name], MD Taking Active Self, Pharmacy Records, Spouse/Significant Other  lisinopril (ZESTRIL) 40 MG tablet [id number] No Take 1 tablet (40 mg total) by mouth daily. Roxan Hockey, MD Taking Active Self, Pharmacy Records, Spouse/Significant Other  meclizine (ANTIVERT) 25 MG tablet [id number] No Take 1 tablet (25 mg total) by mouth 3 (three) times daily as needed for dizziness. Milton Ferguson, MD Taking Active   metFORMIN (GLUCOPHAGE) 850 MG tablet [id number] No Take 1 tablet (850 mg total) by mouth 2 (two) times daily with a meal. Emokpae, Courage, MD Taking Active Self, Pharmacy Records, Spouse/Significant Other  metoprolol tartrate (LOPRESSOR) 50 MG tablet [id number] No Take 1 tablet (50 mg total) by mouth 2 (two) times daily. Roxan Hockey, MD Taking Active Self, Pharmacy Records, Spouse/Significant Other  moxifloxacin (VIGAMOX) 0.5 % ophthalmic solution [id number] No Apply to eye. [provider] Taking Active Self, Pharmacy Records, Spouse/Significant Other           Med Note Jimmye Norman, Anda Latina Jan 18, 2023  3:23 PM) Pt has not started yet  pantoprazole (PROTONIX) 40 MG tablet [id number] No Take 1 tablet (40 mg total) by mouth daily. Roxan Hockey, MD Taking Active Self, Pharmacy Records, Spouse/Significant Other  prednisoLONE acetate (PRED FORTE) 1  % ophthalmic suspension [id number] No  [provider] Taking Active Self, Pharmacy Records, Spouse/Significant Other           Med Note Jimmye Norman, [place] S   Wed Jan 18, 2023  3:23 PM) Pt has not started yet  Semaglutide,0.25 or 0.'5MG'$ /DOS, (OZEMPIC, 0.25 OR 0.5 MG/DOSE,) 2 MG/3ML SOPN [id number] No Inject 0.25 mg into the skin every Sunday. Roxan Hockey, MD Taking Active Self, Pharmacy Records, Spouse/Significant Other           Med Note Jimmye Norman, [place] S   Tue Jan 24, 2023  8:55 PM) Pt has not started yet  Vitamin D, Ergocalciferol, (DRISDOL) 1.25 MG (50000 UNIT) CAPS capsule [id number] No Take 1 capsule (50,000 Units total) by mouth every 7 (seven) days for 12 doses. [name], MD Taking Active Self, Pharmacy Records, Spouse/Significant Other           Med Note Jimmye Norman, [place] Jan 18, 2023  3:25 PM) Pt states he has been taking every day.              Assessment/Plan:   Diabetes: - Currently uncontrolled--A1c 8.7% - Reviewed long term cardiovascular and renal outcomes of uncontrolled blood sugar - Reviewed goal A1c, goal fasting, and goal 2 hour post prandial glucose - Reviewed dietary modifications including healthy plate method - Reviewed lifestyle modifications including: increased water and exercise - Recommend to increase Ozempic to 0.'5mg'$  weekly in two weeks (on 02/03/23--patient takes  on Saturdays); continue metformin (could push metformin dose to 2g/day as tolerated; will titrate GLP1 for now  Denies personal and family history of Medullary thyroid cancer ([facility]) - Recommend to check glucose twice daily or if symptomatic  Patient using accucheck guide meter  New test strips/lancets pended for cosign (attempting to cover 2 times daily testing)   Follow Up Plan: 2 weeks w/ PharmD to ensure GLP1 increase as tolerated    Regina Eck, PharmD, BCACP Clinical Pharmacist, [facility]

## 2023-02-23 ENCOUNTER — Telehealth: Payer: Self-pay | Admitting: Hematology

## 2023-02-23 ENCOUNTER — Other Ambulatory Visit (HOSPITAL_COMMUNITY): Payer: Medicaid Other

## 2023-02-23 NOTE — Telephone Encounter (Signed)
I received a [employer] pt assistance app from the pt. However, he has not yet been diagnosed with cancer.  Pt can reapply if/when he is diagnosed.

## 2023-02-24 ENCOUNTER — Encounter (HOSPITAL_COMMUNITY): Payer: Self-pay | Admitting: Occupational Therapy

## 2023-02-24 DIAGNOSIS — Z419 Encounter for procedure for purposes other than remedying health state, unspecified: Secondary | ICD-10-CM | POA: Diagnosis not present

## 2023-02-27 ENCOUNTER — Encounter (HOSPITAL_COMMUNITY): Payer: Self-pay | Admitting: Occupational Therapy

## 2023-02-27 ENCOUNTER — Ambulatory Visit (HOSPITAL_COMMUNITY): Payer: Medicaid Other | Attending: Family Medicine | Admitting: Occupational Therapy

## 2023-02-27 ENCOUNTER — Ambulatory Visit (HOSPITAL_COMMUNITY): Payer: Medicaid Other | Admitting: Physical Therapy

## 2023-02-27 DIAGNOSIS — R29898 Other symptoms and signs involving the musculoskeletal system: Secondary | ICD-10-CM | POA: Diagnosis not present

## 2023-02-27 DIAGNOSIS — I69354 Hemiplegia and hemiparesis following cerebral infarction affecting left non-dominant side: Secondary | ICD-10-CM | POA: Insufficient documentation

## 2023-02-27 DIAGNOSIS — Z7689 Persons encountering health services in other specified circumstances: Secondary | ICD-10-CM | POA: Diagnosis not present

## 2023-02-27 DIAGNOSIS — R262 Difficulty in walking, not elsewhere classified: Secondary | ICD-10-CM

## 2023-02-27 DIAGNOSIS — R29818 Other symptoms and signs involving the nervous system: Secondary | ICD-10-CM

## 2023-02-27 DIAGNOSIS — R278 Other lack of coordination: Secondary | ICD-10-CM | POA: Insufficient documentation

## 2023-02-27 NOTE — Therapy (Unsigned)
OUTPATIENT OCCUPATIONAL THERAPY NEURO TREATMENT NOTE  Patient Name: Robert Lyons MRN: [id number] DOB:1961-03-29, 62 y.o., male Today's Date: 02/27/2023  PCP: [name], MD REFERRING PROVIDER: Roxan Hockey, MD  END OF SESSION:  OT End of Session - 02/27/23 1434     Visit Number 5    Number of Visits 9    Date for OT Re-Evaluation 03/17/23    Authorization Type Managed medicaid    Authorization Time Period 8 visits (02/10/23-04/01/23)    Authorization - Visit Number 4    Authorization - Number of Visits 8    OT Start Time 1430    OT Stop Time 1510    OT Time Calculation (min) 40 min    Activity Tolerance Patient tolerated treatment well    Behavior During Therapy WFL for tasks assessed/performed             Past Medical History:  Diagnosis Date   GERD (gastroesophageal reflux disease)    Hypertension    Pneumonia    Type 2 diabetes mellitus ([place])    Past Surgical History:  Procedure Laterality Date   No prior surgery     Patient Active Problem List   Diagnosis Date Noted   Paratracheal lymphadenopathy 01/31/2023   Acute CVA (cerebrovascular accident) ([place]) 01/18/2023   Abdominal pain 01/18/2023   Chest pain 01/18/2023   Tobacco abuse 01/18/2023   Hypokalemia 01/18/2023   Vitamin D deficiency 01/05/2023   GERD (gastroesophageal reflux disease) 12/08/2022   Cataracts, both eyes 12/08/2022   Encounter for general adult medical examination with abnormal findings 12/08/2022   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[place]) [id number]   Essential hypertension 07/10/2017   Hyperlipidemia 07/10/2017   Cigarette nicotine dependence without complication [id number]    ONSET DATE: 01/18/23  REFERRING DIAG: R [place] CVA  THERAPY DIAG:  Other lack of coordination  Hemiplegia and hemiparesis following cerebral infarction affecting left non-dominant side ([place])  Other symptoms and signs involving the nervous system  Rationale for Evaluation  and Treatment: Rehabilitation  SUBJECTIVE:   SUBJECTIVE STATEMENT: "I don't think my arm will ever get back right" Pt accompanied by: significant other  PERTINENT HISTORY: Patient states that everything on the L LE went to a "limp" since he had the stroke in January 2024. Also reports of numbness on the LEs. Patient was admitted to the hospital when he had a stroke on 01/18/2023.  PRECAUTIONS: None  WEIGHT BEARING RESTRICTIONS: No  PAIN:  Are you having pain? No  FALLS: Has patient fallen in last 6 months? No  PATIENT GOALS: To get strength back in my L side.   OBJECTIVE:   HAND DOMINANCE: Right  ADLs: Overall ADLs: Pt reports difficulty getting his clothes on, states he gets mixed up with it at times. Additionally due to strength and endurance he has a difficult time cooking and cleaning. Pt's partner assists with medication management.   MOBILITY STATUS: Independent  POSTURE COMMENTS:  No Significant postural limitations Sitting balance: Moves/returns truncal midpoint >2 inches in all planes  ACTIVITY TOLERANCE: Activity tolerance: Pt reports that his endurance has been affected, where he has difficulty sustaining activities due to getting out of breath and weak.   FUNCTIONAL OUTCOME MEASURES: Quick Dash: 45.45  UPPER EXTREMITY ROM:    Active ROM Left eval  Shoulder flexion 132  Shoulder abduction 87  Shoulder internal rotation 90  Shoulder external rotation 45  Elbow flexion 69  Elbow extension 3  Wrist flexion 30  Wrist extension -  2  Wrist ulnar deviation 28  Wrist radial deviation -5  Wrist pronation WFL  Wrist supination 45  (Blank rows = not tested)  UPPER EXTREMITY MMT:     MMT Left eval  Shoulder flexion 4/5  Shoulder abduction 4-/5  Shoulder adduction 4/5  Shoulder extension 4+/5  Shoulder internal rotation 4-/5  Shoulder external rotation 4-/5  Elbow flexion 4/5  Elbow extension 4+/5  Wrist flexion 4/5  Wrist extension 3+/5  Wrist  ulnar deviation 4/5  Wrist radial deviation 3/5  Wrist pronation 5/5  Wrist supination 4+/5  (Blank rows = not tested)  HAND FUNCTION: Grip strength: Right: 70 lbs; Left: 20 lbs, Lateral pinch: Right: 20 lbs, Left: 3 lbs, and 3 point pinch: Right: 10 lbs, Left: 3 lbs  COORDINATION: 9 Hole Peg test: Right: 33.74 sec; Left: 46.55 sec  SENSATION: Light touch: Impaired   EDEMA: Mild edema noted in the L hand  COGNITION: Overall cognitive status: Within functional limits for tasks assessed  VISION: Subjective report: Pt reports he has severe cataracts in both eyes. Was supposed to have surgery, but it got postponed.  Baseline vision: Wears glasses all the time Visual history: cataracts  VISION ASSESSMENT: Not tested  Patient has difficulty with following activities due to following visual impairments: Driving, reading  PERCEPTION: WFL  PRAXIS: WFL  OBSERVATIONS: At times pt demonstrates some word finding difficulties.    TODAY'S TREATMENT:                                                                                                                              DATE:   02/27/23 -Strengthening: 5lb dowel bar, flexion, overhead press, protraction, abduction, bicep curls, x10 -Wrist Strengthening: 3lb dumbbell, flexion, extension, ulnar/radial deviation, supination, pronation, x10 -Theraputty: green putty, roll into a ball, flatten into pancake, use PVC pipe to "cut cookies", roll into log, tripod pinch x10, lateral pinch x10, roll into ball -Grooved Peg Board: 5' to fill entire board  02/22/23 -Weight bearing:  -weight shifting: forwards and backwards x10, side to side x10  -Reaching with RUE to pick up and stack 5 cones, repeat on the LUE  -Modified push ups x10 -Shoulder Strengthening: 2lb dumbbell, flexion, abduction, protraction, horizontal abduction, er/IR, x10 -Bicep curls, 2x10, 3lb dumbbells -Wrist Strengthening: 2lb dumbbell, flexion, extension, ulnar/radial  deviation, supination, pronation, x10 -Pinch Strengthening: Red, green, and blue resistance clips, x5 each, lateral pinch up, tripod pinch down -Gripper: 32lbs picking up 6 medium beads, 35lbs picking up 6 medium beads, 42lbs picking up 6 medium beads and squeezing x10 -Scapula Strengthening: green theraband, extension, rows, retraction, x10  02/20/23 -Scapula Strengthening: green band, extension, retractions, rows, x10 -Shoulder Strengthening: green band, flexion, abduction, chest pulls, er pulls, x10 -Theraputty: red putty, roll into a ball, pinch and pull to find 10 beads -Cards: pushing out with thumb x10, flipping x10, shuffling x10 -Coins: flipping them over, holding 10 coins and pushing them up to D1 and D2  to place them in piggy bank, repeated with 15 coins of different sizes (penny, nickel, dime, quarter)   PATIENT EDUCATION: Education details: Wrist Strengthening Person educated: Patient and Spouse Education method: Explanation Education comprehension: verbalized understanding  HOME EXERCISE PROGRAM: 2/19: Weight bearing (reaching, weight shifting) 2/26: Coins and card tasks 2/28: Wrist Strengthening   GOALS: Goals reviewed with patient? Yes  SHORT TERM GOALS: Target date: 03/17/23  Pt will be provided with and educated on HEP to improve mobility in LUE required for ADL completion.  Goal status: IN PROGRESS  2.  Pt will improve strength to 4+/5 in order to improve ability to perform lifting tasks during meal preparation and cleaning tasks.  Goal status: IN PROGRESS  3.  Pt will increase left grip strength by 10# and pinch strength by 3# to improve ability to grasp and hold pots and pans during simple meal preparation.  Goal status: IN PROGRESS  4.  Pt will increase left hand coordination by completing 9 hole peg test in under 35 seconds, improving ability to manipulate lids and tops on jars, bottles, etc.  Goal status: IN PROGRESS  5.  Pt will improve LUE motor  planning by completing functional reaching tasks with minimal compensatory assist from scapular and trapezius regions.  Goal status: IN PROGRESS  6. Pt will increase A/ROM of LUE by 40 degrees in all directions in order to reach overhead and behind back during dressing and bathing tasks.   Goal status: IN PROGRESS   ASSESSMENT:  CLINICAL IMPRESSION: This session pt working on overall strengthening from his shoulders and scapula down to his wrist and grip/pinch strength. Pt continuing to work on weight bearing and placing increased amounts of his body weight through his LUE for proprioceptive input and strengthening. Additionally, pt demonstrating improving grip strength, as he was able to squeeze and effectively pick up 6 medium beads with 42lbs of resistance. He did require multiple short rest breaks during the session due to fatigue, however was able to recover quickly and continue with exercises. OT providing verbal and tactile cuing for positioning and technique throughout session.   PERFORMANCE DEFICITS: in functional skills including ADLs, IADLs, coordination, sensation, ROM, strength, muscle spasms, Fine motor control, Gross motor control, body mechanics, vision, and UE functional use, cognitive skills including attention, memory, problem solving, and safety awareness.   PLAN:  OT FREQUENCY: 2x/week  OT DURATION: 4 weeks  PLANNED INTERVENTIONS: self care/ADL training, therapeutic exercise, therapeutic activity, neuromuscular re-education, passive range of motion, functional mobility training, electrical stimulation, ultrasound, paraffin, moist heat, cryotherapy, patient/family education, cognitive remediation/compensation, energy conservation, and DME and/or AE instructions  RECOMMENDED OTHER SERVICES: PT and Speech for Cognition  CONSULTED AND AGREED WITH PLAN OF CARE: Patient  PLAN FOR NEXT SESSION: A/ROM, Weight bearing, Closed chain exercises, fine motor tasks, strengthening  tasks.    Paulita Fujita, OTR/L [facility] [place], [place] 02/27/2023, 2:40 PM

## 2023-02-27 NOTE — Therapy (Signed)
OUTPATIENT PHYSICAL THERAPY TREATMENT Patient Name: Robert Lyons MRN: [id number] DOB:03-Jan-1961, 62 y.o., male Today's Date: 02/27/2023   PCP: [name] MD  REFERRING PROVIDER: Roxan Hockey, MD  END OF SESSION:    PT End of Session - 02/23/23        Visit Number 3    Number of Visits 16     Date for PT Re-Evaluation 04/07/23     Authorization Type [place] Medicaid [employer]     Authorization Time Period 12 visits approved 2/16-4/16     Authorization - Visit Number 2     Authorization - Number of Visits 12     Progress Note Due on Visit 8     PT Start Time 1550    PT Stop Time 1632     PT Time Calculation (min) 42 min     Equipment Utilized During Treatment Gait belt     Activity Tolerance Patient limited by fatigue     Behavior During Therapy WFL for tasks assessed/performed            Past Medical History:  Diagnosis Date   GERD (gastroesophageal reflux disease)    Hypertension    Pneumonia    Type 2 diabetes mellitus ([place])    Past Surgical History:  Procedure Laterality Date   No prior surgery     Patient Active Problem List   Diagnosis Date Noted   Paratracheal lymphadenopathy 01/31/2023   Acute CVA (cerebrovascular accident) ([place]) 01/18/2023   Abdominal pain 01/18/2023   Chest pain 01/18/2023   Tobacco abuse 01/18/2023   Hypokalemia 01/18/2023   Vitamin D deficiency 01/05/2023   GERD (gastroesophageal reflux disease) 12/08/2022   Cataracts, both eyes 12/08/2022   Encounter for general adult medical examination with abnormal findings 12/08/2022   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[place]) [id number]   Essential hypertension 07/10/2017   Hyperlipidemia 07/10/2017   Cigarette nicotine dependence without complication [id number]    ONSET DATE: 01/18/2023  REFERRING DIAG: I63.9 (ICD-10-CM) - Acute CVA (cerebrovascular accident) ([place])  THERAPY DIAG:  Weakness of left lower extremity  Difficulty walking  Other symptoms and  signs involving the nervous system  Rationale for Evaluation and Treatment: Rehabilitation  SUBJECTIVE:                                                                                                                                                                                             SUBJECTIVE STATEMENT: Pt reports his whole left side is sore today.  Reports he did some walking over the weekend for exercise since the weather was nice.  Evaluation: Patient states that everything on the L LE went to a "limp" since he had the stroke in January 2024. Also reports of numbness on the LEs. Patient was admitted to the hospital when he had a stroke in 01/18/2023 and was D/C the other night. Denies getting rehabilitation services while at the hospital but he was immediately referred to outpatient PT evaluation and management. Patient is also receiving outpatient OT services at this time.  Pt accompanied by: significant other  PERTINENT HISTORY: DM, HTN  PAIN:  Are you having pain? Yes: NPRS scale: 2/10 Pain location: L LE Pain description: sharp, intermittent Aggravating factors: comes and goes randomly Relieving factors: comes and goes randomly  PRECAUTIONS: None  WEIGHT BEARING RESTRICTIONS: No  FALLS: Has patient fallen in last 6 months? No  LIVING ENVIRONMENT: Lives with:  significant other Lives in: House/apartment Stairs: No Has following equipment at home: Single point cane and shower chair  PLOF: Independent  PATIENT GOALS: "to get my strength back"  OBJECTIVE:   DIAGNOSTIC FINDINGS:  MRI Brain without contrast 01/18/2023 IMPRESSION: 1. 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 2. No intracranial large vessel occlusion or significant stenosis.  COGNITION: Overall cognitive status: Within functional limits for tasks assessed   SENSATION: Patient reports that the L LE has can feel "more" than the R as to light touch  COORDINATION: Moderately impaired on  the L LE as to heel-to-shin No difficulty with alternating seated foot taps  MUSCLE LENGTH: Moderate tightness on B hamstrings and gastrocnemius  POSTURE: rounded shoulders, forward head, and L knee slightly flexed in standing  LOWER EXTREMITY ROM:     Active  Right Eval Left Eval  Hip flexion [facility]  Hip extension [facility]  Hip abduction [facility]  Hip adduction    Hip internal rotation    Hip external rotation    Knee flexion [facility] WFL  Knee extension [facility]  Ankle dorsiflexion [facility] WFL  Ankle plantarflexion [facility] WFL  Ankle inversion    Ankle eversion     (Blank rows = not tested)  LOWER EXTREMITY MMT:    MMT Right Eval Left Eval  Hip flexion 5 3+  Hip extension 4 3+  Hip abduction 4+ 3+  Hip adduction    Hip internal rotation    Hip external rotation    Knee flexion 5 4-  Knee extension 4 3+  Ankle dorsiflexion 5 3+  Ankle plantarflexion 5 3+  Ankle inversion    Ankle eversion    (Blank rows = not tested)  BED MOBILITY:  Sit to supine Complete Independence Supine to sit Complete Independence Rolling to Right Complete Independence Rolling to Left Complete Independence  TRANSFERS: Assistive device utilized: None  Sit to stand: SBA Stand to sit: SBA  GAIT: Gait pattern: decreased step length- Right, decreased stance time- Left, decreased stride length, decreased hip/knee flexion- Left, decreased ankle dorsiflexion- Left, Left hip hike, knee flexed in stance- Left, lateral lean- Right, narrow BOS, poor foot clearance- Right, and poor foot clearance- Left.  Distance walked: 207 ft Assistive device utilized: Single point cane Level of assistance: CGA Comments: moderately unsteady  FUNCTIONAL TESTS:  5 times sit to stand: 21.53 sec Timed up and go (TUG): 22.83 sec 2 minute walk test: 207 ft  TODAY'S TREATMENT:  DATE:   02/27/2023 Nustep level 2 5 minutes UE/LE seat 10 Standing:  heelraise 20X  Hip abduction 20X each 2#  Hip extension 20X each 2#  Marching alternating with 5" holds 10X each 2#  4" step up laterals 20X each  4" lunge with 1 UE assist 20X each  Tandem stance 30" each lead  Vector 10X5" with 1 UE assist each LE Sit to stands 10X no UE from standard chair  02/22/2023 Nustep level 2 5 minutes UE/LE seat 10 Standing:  heelraise 20X  Hip abduction 20X each  Hip extension 20X each  Marching alternating with 5" holds 10X each  4" step up laterals 20X each  4" lunge with 1 UE assist 10X each  Tandem stance 30" each lead  Vector 10X5" with 1 UE assist  Squats 10X Sit to stands 10X no UE from standard chair Long arc quad 20X5" holds each LE  02/13/2023 Nustep level 2 5 minutes UE/LE seat 10 Standing:  heelraise 20X  [place] 20X  Hip abduction 20X each  Hip extension 20X each  Marching alternating with 5" holds 10X each  4" step up laterals 20X each Sit to stands 10X no UE from standard chair Long arc quad 20X5" holds each LE  02/10/23 Evaluation and patient education    PATIENT EDUCATION: Education details: Educated on the pathoanatomy of CVA. Educated on the goals and course of rehab. Education on falls reduction/prevention at home. Person educated: Patient and significant other Education method: Explanation Education comprehension: verbalized understanding  HOME EXERCISE PROGRAM: Access Code: [email or web address] URL: [email or web address] Date: 02/13/2023 Prepared by: Roseanne Reno Exercises - Seated Long Arc Quad  - 2 x daily - 7 x weekly - 1 sets - 20 reps - 5 sec hold - Sit to Stand  - 2 x daily - 7 x weekly - 1 sets - 10 reps - Standing Hip Abduction  - 2 x daily - 7 x weekly - 1 sets - 20 reps - Standing Hip Extension  - 2 x daily - 7 x weekly - 1 sets - 20 reps - Standing Hip Flexion  - 2 x daily - 7 x weekly - 1 sets - 20 reps - 5 sec hold   GOALS: Goals  reviewed with patient? Yes  SHORT TERM GOALS: Target date: 03/11/2023  Pt will demonstrate indep in HEP to facilitate carry-over of skilled services and improve functional outcomes  Goal status: IN PROGRESS  LONG TERM GOALS: Target date: 04/07/2023  Pt will have a decrease in TUG score by at least 3 sec in order to demonstrate clinically significant improvement in community ambulation Baseline: 22.83 sec Goal status: IN PROGRESS  2.  Pt will decrease 5TSTS by at least 3 seconds in order to demonstrate clinically significant improvement in LE strength  Baseline: 21.53 sec Goal status: IN PROGRESS  3.  Pt will increase 2MWT by at least 40 ft in order to demonstrate clinically significant improvement in community ambulation  Baseline: 207 ft Goal status: IN PROGRESS  4.  Pt will demonstrate increase in LE strength to 4+/5 to facilitate ease and safety in ambulation  Baseline: 3+/5 Goal status: IN PROGRESS  5.  Pt will be able to walk on level surfaces > 100 ft without an assistive device with little to no gait deviation Goal status: IN PROGRESS  ASSESSMENT:  CLINICAL IMPRESSION: Continued focus on improving LE strength and stability.  Added 2# cuff weights for hip exercises.  Pt with increased discomfort in LT  LE today and increasing discomfort in Lt knee during session today when began single leg stance activities. Pt continues to require cues for exercise recall and general form.  Unable to maintain greater than 5" with either LE leading requiring UE assist.  Pt will continue to benefit from skilled physical therapy to address deficits and improve functional status.    OBJECTIVE IMPAIRMENTS: Abnormal gait, decreased balance, decreased endurance, decreased mobility, difficulty walking, decreased strength, and impaired flexibility.   ACTIVITY LIMITATIONS: lifting, bending, sitting, standing, squatting, stairs, transfers, and locomotion level  PARTICIPATION LIMITATIONS: meal prep,  cleaning, laundry, driving, shopping, and community activity  PERSONAL FACTORS: Fitness are also affecting patient's functional outcome.   REHAB POTENTIAL: Good  CLINICAL DECISION MAKING: Stable/uncomplicated  EVALUATION COMPLEXITY: Low  PLAN:  PT FREQUENCY: 2x/week  PT DURATION: 8 weeks  PLANNED INTERVENTIONS: Therapeutic exercises, Therapeutic activity, Neuromuscular re-education, Balance training, Gait training, Patient/Family education, Self Care, Stair training, and Electrical stimulation  PLAN FOR NEXT SESSION:  Progress strengthening, balance and gait training.  Teena Irani, PTA/CLT [place] Ph: [phone]  02/27/2023, 2:21 PM

## 2023-03-01 ENCOUNTER — Encounter (HOSPITAL_COMMUNITY): Payer: Medicaid Other | Admitting: Occupational Therapy

## 2023-03-01 ENCOUNTER — Ambulatory Visit: Payer: Medicaid Other | Admitting: Hematology

## 2023-03-01 ENCOUNTER — Inpatient Hospital Stay: Payer: Medicaid Other | Attending: Hematology | Admitting: Licensed Clinical Social Worker

## 2023-03-01 ENCOUNTER — Ambulatory Visit (HOSPITAL_COMMUNITY): Payer: Medicaid Other

## 2023-03-01 ENCOUNTER — Telehealth: Payer: Self-pay | Admitting: *Deleted

## 2023-03-01 DIAGNOSIS — R590 Localized enlarged lymph nodes: Secondary | ICD-10-CM

## 2023-03-01 DIAGNOSIS — Z7689 Persons encountering health services in other specified circumstances: Secondary | ICD-10-CM | POA: Diagnosis not present

## 2023-03-01 NOTE — Progress Notes (Signed)
[facility] CSW Progress Note  Clinical Education officer, museum  spoke with Robert Lyons regarding questions about SSI.  Pt has not received a call back from his Medicaid case manager.  CSW advised for pt to go to the social security office as it may be more efficient to check on pt's SSI application in person.  Pt will not follow up w/ Dr. Raliegh Ip until after scans in May as there is a low suspicion for malignancy.  CSW to remain available as appropriate.      [name], LCSW    Patient is participating in a Managed Medicaid Plan:  Yes

## 2023-03-02 ENCOUNTER — Ambulatory Visit (INDEPENDENT_AMBULATORY_CARE_PROVIDER_SITE_OTHER): Payer: Medicaid Other | Admitting: Internal Medicine

## 2023-03-02 ENCOUNTER — Encounter: Payer: Self-pay | Admitting: Internal Medicine

## 2023-03-02 VITALS — BP 123/71 | HR 79 | Ht 71.0 in | Wt 192.4 lb

## 2023-03-02 DIAGNOSIS — R590 Localized enlarged lymph nodes: Secondary | ICD-10-CM | POA: Diagnosis not present

## 2023-03-02 DIAGNOSIS — E1136 Type 2 diabetes mellitus with diabetic cataract: Secondary | ICD-10-CM

## 2023-03-02 DIAGNOSIS — Z7689 Persons encountering health services in other specified circumstances: Secondary | ICD-10-CM | POA: Diagnosis not present

## 2023-03-02 MED ORDER — ACCU-CHEK GUIDE VI STRP: ORAL_STRIP | 12 refills | Status: DC

## 2023-03-02 MED ORDER — OZEMPIC (0.25 OR 0.5 MG/DOSE) 2 MG/3ML ~~LOC~~ SOPN: 0.5000 mg | PEN_INJECTOR | SUBCUTANEOUS | 2 refills | Status: DC

## 2023-03-02 MED ORDER — ACCU-CHEK MULTICLIX LANCETS MISC: 12 refills | Status: AC

## 2023-03-02 NOTE — Assessment & Plan Note (Signed)
Ozempic 0.25 mg weekly was started at his last appointment for improved treatment of diabetes mellitus. Last A1c 8.7 in December. He has not experienced any adverse side effects with starting Ozempic. He is additionally prescribed Metformin 850 mg BID.  -Increase Ozempic to 0.5 mg weekly -Repeat A1c at follow up in 3 months

## 2023-03-02 NOTE — Assessment & Plan Note (Signed)
Followed by oncology and ENT. He will see GI on 3/18. 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 CT is scheduled for 3/26. ENT follow up pending results. Oncology follow up is scheduled for 5/20.

## 2023-03-02 NOTE — Progress Notes (Signed)
Established Patient Office Visit  Subjective   Patient ID: Robert Lyons, male    DOB: 07/12/1961  Age: 62 y.o. MRN: [id number]  Chief Complaint  Patient presents with   Diabetes    Follow up   [name] returns to care today for DM follow up. He was last seen by me on 2/8 for follow up after hospital admission for CVA 1/24-25, discharged on ASA/Plavix x 21 days. Ozempic 0.25 mg weekly was started for improved treatment of diabetes mellitus. In the interim [name] has been seen by ENT at [facility] for paratracheal lymphadenopathy. 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 CT is scheduled for later this month. He has also been referred to gastroenterology for evaluation and has an appointment scheduled for 3/18. [name] reports feeling fairly well today. He endorses chronic back pain but is otherwise asymptomatic. He has no additional concerns to discuss today.  Past Medical History:  Diagnosis Date   GERD (gastroesophageal reflux disease)    Hypertension    Pneumonia    Type 2 diabetes mellitus ([place])    Past Surgical History:  Procedure Laterality Date   No prior surgery     Social History   Tobacco Use   Smoking status: Former    Packs/day: 1.00    Years: 15.00    Total pack years: 15.00    Types: Cigarettes    Quit date: 01/17/2023    Years since quitting: 0.1   Smokeless tobacco: Never  Vaping Use   Vaping Use: Never used  Substance Use Topics   Alcohol use: No    Comment: none since 2014   Drug use: No   Family History  Problem Relation Age of Onset   Diabetes Mother    Hypertension Mother    Diabetes Maternal Aunt    Diabetes Maternal Uncle    No Known Allergies  Review of Systems  Musculoskeletal:  Positive for back pain (chronic).  All other systems reviewed and are negative.    Objective:     BP 123/71   Pulse 79   Ht '5\' 11"'$   (1.803 m)   Wt 192 lb 6.4 oz (87.3 kg)   SpO2 96%   BMI 26.83 kg/m  BP Readings from Last 3 Encounters:  03/02/23 123/71  02/22/23 138/70  02/02/23 122/79   Physical Exam Vitals reviewed.  Constitutional:      General: He is not in acute distress.    Appearance: Normal appearance. He is not ill-appearing.  HENT:     Head: Normocephalic and atraumatic.     Right Ear: External ear normal.     Left Ear: External ear normal.     Nose: Nose normal. No congestion or rhinorrhea.     Mouth/Throat:     Mouth: Mucous membranes are moist.     Pharynx: Oropharynx is clear.  Eyes:     General: No scleral icterus.    Extraocular Movements: Extraocular movements intact.     Conjunctiva/sclera: Conjunctivae normal.     Pupils: Pupils are equal, round, and reactive to light.  Cardiovascular:     Rate and Rhythm: Normal rate and regular rhythm.     Pulses: Normal pulses.     Heart sounds: Normal heart sounds. No murmur heard. Pulmonary:     Effort: Pulmonary effort is normal.     Breath sounds: Normal breath sounds. No wheezing, rhonchi or  rales.  Abdominal:     General: Abdomen is flat. Bowel sounds are normal. There is no distension.     Palpations: Abdomen is soft.     Tenderness: There is no abdominal tenderness.  Musculoskeletal:        General: No swelling or deformity. Normal range of motion.     Cervical back: Normal range of motion.  Skin:    General: Skin is warm and dry.     Capillary Refill: Capillary refill takes less than 2 seconds.  Neurological:     General: No focal deficit present.     Mental Status: He is alert and oriented to person, place, and time.     Motor: No weakness.  Psychiatric:        Mood and Affect: Mood normal.        Behavior: Behavior normal.        Thought Content: Thought content normal.   Last CBC Lab Results  Component Value Date   WBC 8.0 01/24/2023   HGB 14.6 01/24/2023   HCT 42.3 01/24/2023   MCV 89.2 01/24/2023   MCH 30.8  01/24/2023   RDW 11.8 01/24/2023   PLT 270 [id number]   Last metabolic panel Lab Results  Component Value Date   GLUCOSE 166 (H) 01/24/2023   NA 138 01/24/2023   K 4.2 01/24/2023   CL 103 01/24/2023   CO2 22 01/24/2023   BUN 19 01/24/2023   CREATININE 1.29 (H) 01/24/2023   GFRNONAA >60 01/24/2023   CALCIUM 10.5 (H) 01/24/2023   PROT 7.1 01/18/2023   ALBUMIN 4.1 01/18/2023   LABGLOB 2.5 12/02/2022   AGRATIO 1.9 12/02/2022   BILITOT 1.0 01/18/2023   ALKPHOS 58 01/18/2023   AST 28 01/18/2023   ALT 19 01/18/2023   ANIONGAP 13 01/24/2023   Last lipids Lab Results  Component Value Date   CHOL 81 01/19/2023   HDL 34 (L) 01/19/2023   LDLCALC 29 01/19/2023   TRIG 89 01/19/2023   CHOLHDL 2.4 01/19/2023   Last hemoglobin A1c Lab Results  Component Value Date   HGBA1C 8.7 (H) 12/02/2022   Last thyroid functions Lab Results  Component Value Date   TSH 1.890 12/02/2022   Last vitamin D Lab Results  Component Value Date   VD25OH 15.4 (L) 12/02/2022   Last vitamin B12 and Folate Lab Results  Component Value Date   VITAMINB12 381 12/02/2022   FOLATE 9.7 12/02/2022     Assessment & Plan:   Problem List Items Addressed This Visit       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HCC)    Ozempic 0.25 mg weekly was started at his last appointment for improved treatment of diabetes mellitus. Last A1c 8.7 in December. He has not experienced any adverse side effects with starting Ozempic. He is additionally prescribed Metformin 850 mg BID.  -Increase Ozempic to 0.5 mg weekly -Repeat A1c at follow up in 3 months      Paratracheal lymphadenopathy - Primary    Followed by oncology and ENT. He will see GI on 3/18. 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 CT is scheduled for 3/26. ENT follow up pending results. Oncology follow up is scheduled for 5/20.      Return in about 3 months (around 06/02/2023).   [name], MD

## 2023-03-02 NOTE — Patient Instructions (Signed)
It was a pleasure to see you today.  Thank you for giving [place] the opportunity to be involved in your care.  Below is a brief recap of your visit and next steps.  We will plan to see you again in 3 months.  Summary Increase Ozempic to 0.5 mg weekly Follow up in 3 months

## 2023-03-03 ENCOUNTER — Encounter (HOSPITAL_COMMUNITY): Payer: Medicaid Other | Admitting: Occupational Therapy

## 2023-03-03 ENCOUNTER — Ambulatory Visit (HOSPITAL_COMMUNITY): Payer: Medicaid Other

## 2023-03-03 NOTE — Telephone Encounter (Signed)
Called pt to inquire about no show for OT appointment. Pt sounded like he did not feel well, he reports that he forgot about OT/PT appointments for today. Reminded him of next weeks OT/PT appointments on 3/11. Notified PT that pt would not be coming and to cancel.   Arvil Persons, OTR/L

## 2023-03-06 ENCOUNTER — Encounter (HOSPITAL_COMMUNITY): Payer: Self-pay | Admitting: Occupational Therapy

## 2023-03-06 ENCOUNTER — Ambulatory Visit (HOSPITAL_COMMUNITY): Payer: Medicaid Other | Admitting: Occupational Therapy

## 2023-03-06 ENCOUNTER — Ambulatory Visit (HOSPITAL_COMMUNITY): Payer: Medicaid Other

## 2023-03-06 DIAGNOSIS — R262 Difficulty in walking, not elsewhere classified: Secondary | ICD-10-CM

## 2023-03-06 DIAGNOSIS — R29818 Other symptoms and signs involving the nervous system: Secondary | ICD-10-CM | POA: Diagnosis not present

## 2023-03-06 DIAGNOSIS — Z7689 Persons encountering health services in other specified circumstances: Secondary | ICD-10-CM | POA: Diagnosis not present

## 2023-03-06 DIAGNOSIS — R29898 Other symptoms and signs involving the musculoskeletal system: Secondary | ICD-10-CM

## 2023-03-06 DIAGNOSIS — I69354 Hemiplegia and hemiparesis following cerebral infarction affecting left non-dominant side: Secondary | ICD-10-CM

## 2023-03-06 DIAGNOSIS — R278 Other lack of coordination: Secondary | ICD-10-CM | POA: Diagnosis not present

## 2023-03-06 NOTE — Therapy (Unsigned)
OUTPATIENT OCCUPATIONAL THERAPY NEURO TREATMENT NOTE  Patient Name: Robert Lyons MRN: [id number] DOB:Apr 27, 1961, 62 y.o., male Today's Date: 03/06/2023  PCP: [name], MD REFERRING PROVIDER: Roxan Hockey, MD  END OF SESSION:  OT End of Session - 03/06/23 1433     Visit Number 6    Number of Visits 9    Date for OT Re-Evaluation 03/17/23    Authorization Type Managed medicaid    Authorization Time Period 8 visits (02/10/23-04/01/23)    Authorization - Visit Number 5    Authorization - Number of Visits 8    OT Start Time [id number]    OT Stop Time 1515    OT Time Calculation (min) 42 min    Activity Tolerance Patient tolerated treatment well    Behavior During Therapy WFL for tasks assessed/performed             Past Medical History:  Diagnosis Date   GERD (gastroesophageal reflux disease)    Hypertension    Pneumonia    Type 2 diabetes mellitus ([place])    Past Surgical History:  Procedure Laterality Date   No prior surgery     Patient Active Problem List   Diagnosis Date Noted   Paratracheal lymphadenopathy 01/31/2023   Acute CVA (cerebrovascular accident) ([place]) 01/18/2023   Abdominal pain 01/18/2023   Chest pain 01/18/2023   Tobacco abuse 01/18/2023   Hypokalemia 01/18/2023   Vitamin D deficiency 01/05/2023   GERD (gastroesophageal reflux disease) 12/08/2022   Cataracts, both eyes 12/08/2022   Encounter for general adult medical examination with abnormal findings 12/08/2022   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[name]) [id number]   Essential hypertension 07/10/2017   Hyperlipidemia 07/10/2017   Cigarette nicotine dependence without complication [id number]    ONSET DATE: 01/18/23  REFERRING DIAG: [place] CVA  THERAPY DIAG:  Other lack of coordination  Other symptoms and signs involving the nervous system  Hemiplegia and hemiparesis following cerebral infarction affecting left non-dominant side ([place])  Rationale for  Evaluation and Treatment: Rehabilitation  SUBJECTIVE:   SUBJECTIVE STATEMENT: "I've been doing stuff with my arm as I can." Pt accompanied by: significant other  PERTINENT HISTORY: Patient states that everything on the L LE went to a "limp" since he had the stroke in January 2024. Also reports of numbness on the LEs. Patient was admitted to the hospital when he had a stroke on 01/18/2023.  PRECAUTIONS: None  WEIGHT BEARING RESTRICTIONS: No  PAIN:  Are you having pain? No  FALLS: Has patient fallen in last 6 months? No  PATIENT GOALS: To get strength back in my L side.   OBJECTIVE:   HAND DOMINANCE: Right  ADLs: Overall ADLs: Pt reports difficulty getting his clothes on, states he gets mixed up with it at times. Additionally due to strength and endurance he has a difficult time cooking and cleaning. Pt's partner assists with medication management.   MOBILITY STATUS: Independent  POSTURE COMMENTS:  No Significant postural limitations Sitting balance: Moves/returns truncal midpoint >2 inches in all planes  ACTIVITY TOLERANCE: Activity tolerance: Pt reports that his endurance has been affected, where he has difficulty sustaining activities due to getting out of breath and weak.   FUNCTIONAL OUTCOME MEASURES: Quick Dash: 45.45  UPPER EXTREMITY ROM:    Active ROM Left eval  Shoulder flexion 132  Shoulder abduction 87  Shoulder internal rotation 90  Shoulder external rotation 45  Elbow flexion 69  Elbow extension 3  Wrist flexion 30  Wrist extension -  2  Wrist ulnar deviation 28  Wrist radial deviation -5  Wrist pronation WFL  Wrist supination 45  (Blank rows = not tested)  UPPER EXTREMITY MMT:     MMT Left eval  Shoulder flexion 4/5  Shoulder abduction 4-/5  Shoulder adduction 4/5  Shoulder extension 4+/5  Shoulder internal rotation 4-/5  Shoulder external rotation 4-/5  Elbow flexion 4/5  Elbow extension 4+/5  Wrist flexion 4/5  Wrist extension 3+/5   Wrist ulnar deviation 4/5  Wrist radial deviation 3/5  Wrist pronation 5/5  Wrist supination 4+/5  (Blank rows = not tested)  HAND FUNCTION: Grip strength: Right: 70 lbs; Left: 20 lbs, Lateral pinch: Right: 20 lbs, Left: 3 lbs, and 3 point pinch: Right: 10 lbs, Left: 3 lbs  COORDINATION: 9 Hole Peg test: Right: 33.74 sec; Left: 46.55 sec  SENSATION: Light touch: Impaired   EDEMA: Mild edema noted in the L hand  COGNITION: Overall cognitive status: Within functional limits for tasks assessed  VISION: Subjective report: Pt reports he has severe cataracts in both eyes. Was supposed to have surgery, but it got postponed.  Baseline vision: Wears glasses all the time Visual history: cataracts  VISION ASSESSMENT: Not tested  Patient has difficulty with following activities due to following visual impairments: Driving, reading  PERCEPTION: WFL  PRAXIS: WFL  OBSERVATIONS: At times pt demonstrates some word finding difficulties.    TODAY'S TREATMENT:                                                                                                                              DATE:   03/06/23 -Shoulder Strengthening: 2lb dumbbells, flexion, abduction, protraction, horizontal abduction, er/IR, x15 -Gripper: 35lbs picking up 10 medium beads, 42lbs picking up 6 medium beads, 45lbs squeeze x10 -Digiflex: 7lb full squeeze x15, each digit x8 -Tiny peg board pattern  02/27/23 -Strengthening: 5lb dowel bar, flexion, overhead press, protraction, abduction, bicep curls, x10 -Wrist Strengthening: 3lb dumbbell, flexion, extension, ulnar/radial deviation, supination, pronation, x10 -Theraputty: green putty, roll into a ball, flatten into pancake, use PVC pipe to "cut cookies", roll into log, tripod pinch x10, lateral pinch x10, roll into ball -Grooved Peg Board: 5' to fill entire board  02/22/23 -Weight bearing:  -weight shifting: forwards and backwards x10, side to side x10  -Reaching with  RUE to pick up and stack 5 cones, repeat on the LUE  -Modified push ups x10 -Shoulder Strengthening: 2lb dumbbell, flexion, abduction, protraction, horizontal abduction, er/IR, x10 -Bicep curls, 2x10, 3lb dumbbells -Wrist Strengthening: 2lb dumbbell, flexion, extension, ulnar/radial deviation, supination, pronation, x10 -Pinch Strengthening: Red, green, and blue resistance clips, x5 each, lateral pinch up, tripod pinch down -Gripper: 32lbs picking up 6 medium beads, 35lbs picking up 6 medium beads, 42lbs picking up 6 medium beads and squeezing x10 -Scapula Strengthening: green theraband, extension, rows, retraction, x10   PATIENT EDUCATION: Education details: Review HEP Person educated: Patient and Spouse Education method: Explanation Education comprehension: verbalized  understanding  HOME EXERCISE PROGRAM: 2/19: Weight bearing (reaching, weight shifting) 2/26: Coins and card tasks 2/28: Wrist Strengthening 3/4: Shoulder Strengthening    GOALS: Goals reviewed with patient? Yes  SHORT TERM GOALS: Target date: 03/17/23  Pt will be provided with and educated on HEP to improve mobility in LUE required for ADL completion.  Goal status: IN PROGRESS  2.  Pt will improve strength to 4+/5 in order to improve ability to perform lifting tasks during meal preparation and cleaning tasks.  Goal status: IN PROGRESS  3.  Pt will increase left grip strength by 10# and pinch strength by 3# to improve ability to grasp and hold pots and pans during simple meal preparation.  Goal status: IN PROGRESS  4.  Pt will increase left hand coordination by completing 9 hole peg test in under 35 seconds, improving ability to manipulate lids and tops on jars, bottles, etc.  Goal status: IN PROGRESS  5.  Pt will improve LUE motor planning by completing functional reaching tasks with minimal compensatory assist from scapular and trapezius regions.  Goal status: IN PROGRESS  6. Pt will increase A/ROM of LUE  by 40 degrees in all directions in order to reach overhead and behind back during dressing and bathing tasks.   Goal status: IN PROGRESS   ASSESSMENT:  CLINICAL IMPRESSION: Pt continuing to improve his overall strength. This session he completed shoulder strengthening with dumbbells, gripper strength up to 45lbs, and coordination task with a tiny peg board. He required increased time with the coordination task, as he struggled with holding on to the pieces, putting them in the correct places following the pattern, and placing them into the peg holes. OT providing mod verbal cuing for problem solving and attention during the coordination tasks, and verbal/tactile cuing for positioning and technique throughout all other tasks.   PERFORMANCE DEFICITS: in functional skills including ADLs, IADLs, coordination, sensation, ROM, strength, muscle spasms, Fine motor control, Gross motor control, body mechanics, vision, and UE functional use, cognitive skills including attention, memory, problem solving, and safety awareness.   PLAN:  OT FREQUENCY: 2x/week  OT DURATION: 4 weeks  PLANNED INTERVENTIONS: self care/ADL training, therapeutic exercise, therapeutic activity, neuromuscular re-education, passive range of motion, functional mobility training, electrical stimulation, ultrasound, paraffin, moist heat, cryotherapy, patient/family education, cognitive remediation/compensation, energy conservation, and DME and/or AE instructions  RECOMMENDED OTHER SERVICES: PT and Speech for Cognition  CONSULTED AND AGREED WITH PLAN OF CARE: Patient  PLAN FOR NEXT SESSION: A/ROM, Weight bearing, Closed chain exercises, fine motor tasks, strengthening tasks.    Paulita Fujita, OTR/L [facility] [place], [place] 03/06/2023, 2:34 PM

## 2023-03-06 NOTE — Therapy (Signed)
OUTPATIENT PHYSICAL THERAPY TREATMENT Patient Name: Robert Lyons MRN: [id number] DOB:[birth year], 62 y.o., male Today's Date: 03/06/2023   PCP: [name] MD  REFERRING PROVIDER: Roxan Hockey, MD  END OF SESSION:    PT End of Session - 02/23/23        Visit Number 3    Number of Visits 16     Date for PT Re-Evaluation 04/07/23     Authorization Type  Medicaid [employer]     Authorization Time Period 12 visits approved 2/16-4/16     Authorization - Visit Number 2     Authorization - Number of Visits 12     Progress Note Due on Visit 8     PT Start Time 1550    PT Stop Time 1632     PT Time Calculation (min) 42 min     Equipment Utilized During Treatment Gait belt     Activity Tolerance Patient limited by fatigue     Behavior During Therapy WFL for tasks assessed/performed            Past Medical History:  Diagnosis Date   GERD (gastroesophageal reflux disease)    Hypertension    Pneumonia    Type 2 diabetes mellitus ([place])    Past Surgical History:  Procedure Laterality Date   No prior surgery     Patient Active Problem List   Diagnosis Date Noted   Paratracheal lymphadenopathy 01/31/2023   Acute CVA (cerebrovascular accident) ([place]) 01/18/2023   Abdominal pain 01/18/2023   Chest pain 01/18/2023   Tobacco abuse 01/18/2023   Hypokalemia 01/18/2023   Vitamin D deficiency 01/05/2023   GERD (gastroesophageal reflux disease) 12/08/2022   Cataracts, both eyes 12/08/2022   Encounter for general adult medical examination with abnormal findings 12/08/2022   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[place]) [id number]   Essential hypertension 07/10/2017   Hyperlipidemia 07/10/2017   Cigarette nicotine dependence without complication [id number]    ONSET DATE: 01/18/2023  REFERRING DIAG: I63.9 (ICD-10-CM) - Acute CVA (cerebrovascular accident) ([place])  THERAPY DIAG:  Weakness of left lower extremity  Difficulty walking  Other symptoms  and signs involving the nervous system  Rationale for Evaluation and Treatment: Rehabilitation  SUBJECTIVE:                                                                                                                                                                                             SUBJECTIVE STATEMENT: Patient reports no new falls; has left side pain that comes and goes; currently about a 5/10.     Evaluation: Patient states that everything on the L  LE went to a "limp" since he had the stroke in January 2024. Also reports of numbness on the LEs. Patient was admitted to the hospital when he had a stroke in 01/18/2023 and was D/C the other night. Denies getting rehabilitation services while at the hospital but he was immediately referred to outpatient PT evaluation and management. Patient is also receiving outpatient OT services at this time.  Pt accompanied by: significant other  PERTINENT HISTORY: DM, HTN  PAIN:  Are you having pain? Yes: NPRS scale: 2/10 Pain location: L LE Pain description: sharp, intermittent Aggravating factors: comes and goes randomly Relieving factors: comes and goes randomly  PRECAUTIONS: None  WEIGHT BEARING RESTRICTIONS: No  FALLS: Has patient fallen in last 6 months? No  LIVING ENVIRONMENT: Lives with:  significant other Lives in: House/apartment Stairs: No Has following equipment at home: Single point cane and shower chair  PLOF: Independent  PATIENT GOALS: "to get my strength back"  OBJECTIVE:   DIAGNOSTIC FINDINGS:  MRI Brain without contrast 01/18/2023 IMPRESSION: 1. 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 2. No intracranial large vessel occlusion or significant stenosis.  COGNITION: Overall cognitive status: Within functional limits for tasks assessed   SENSATION: Patient reports that the L LE has can feel "more" than the R as to light touch  COORDINATION: Moderately impaired on the L LE as to heel-to-shin No  difficulty with alternating seated foot taps  MUSCLE LENGTH: Moderate tightness on B hamstrings and gastrocnemius  POSTURE: rounded shoulders, forward head, and L knee slightly flexed in standing  LOWER EXTREMITY ROM:     Active  Right Eval Left Eval  Hip flexion [facility]  Hip extension [facility]  Hip abduction [facility]  Hip adduction    Hip internal rotation    Hip external rotation    Knee flexion [facility] WFL  Knee extension [facility]  Ankle dorsiflexion [facility] WFL  Ankle plantarflexion [facility] WFL  Ankle inversion    Ankle eversion     (Blank rows = not tested)  LOWER EXTREMITY MMT:    MMT Right Eval Left Eval  Hip flexion 5 3+  Hip extension 4 3+  Hip abduction 4+ 3+  Hip adduction    Hip internal rotation    Hip external rotation    Knee flexion 5 4-  Knee extension 4 3+  Ankle dorsiflexion 5 3+  Ankle plantarflexion 5 3+  Ankle inversion    Ankle eversion    (Blank rows = not tested)  BED MOBILITY:  Sit to supine Complete Independence Supine to sit Complete Independence Rolling to Right Complete Independence Rolling to Left Complete Independence  TRANSFERS: Assistive device utilized: None  Sit to stand: SBA Stand to sit: SBA  GAIT: Gait pattern: decreased step length- Right, decreased stance time- Left, decreased stride length, decreased hip/knee flexion- Left, decreased ankle dorsiflexion- Left, Left hip hike, knee flexed in stance- Left, lateral lean- Right, narrow BOS, poor foot clearance- Right, and poor foot clearance- Left.  Distance walked: 207 ft Assistive device utilized: Single point cane Level of assistance: CGA Comments: moderately unsteady  FUNCTIONAL TESTS:  5 times sit to stand: 21.53 sec Timed up and go (TUG): 22.83 sec 2 minute walk test: 207 ft  TODAY'S TREATMENT:  DATE:  03/06/2023 Nustep level 2 5 minutes UE/LE seat  10  Standing: Heel raises x 20 Marching 3# x 20 Hip abduction 3# x 20 Hip extension 3# x 20 4" box step ups x 10 each 4" box lateral step ups x 10 Tandem stance 2 x 30"  Sit to stand x 10 no UE assist  02/27/2023 Nustep level 2 5 minutes UE/LE seat 10 Standing:  heelraise 20X  Hip abduction 20X each 2#  Hip extension 20X each 2#  Marching alternating with 5" holds 10X each 2#  4" step up laterals 20X each  4" lunge with 1 UE assist 20X each  Tandem stance 30" each lead  Vector 10X5" with 1 UE assist each LE Sit to stands 10X no UE from standard chair  02/22/2023 Nustep level 2 5 minutes UE/LE seat 10 Standing:  heelraise 20X  Hip abduction 20X each  Hip extension 20X each  Marching alternating with 5" holds 10X each  4" step up laterals 20X each  4" lunge with 1 UE assist 10X each  Tandem stance 30" each lead  Vector 10X5" with 1 UE assist  Squats 10X Sit to stands 10X no UE from standard chair Long arc quad 20X5" holds each LE  02/13/2023 Nustep level 2 5 minutes UE/LE seat 10 Standing:  heelraise 20X  Toeraise 20X  Hip abduction 20X each  Hip extension 20X each  Marching alternating with 5" holds 10X each  4" step up laterals 20X each Sit to stands 10X no UE from standard chair Long arc quad 20X5" holds each LE  02/10/23 Evaluation and patient education    PATIENT EDUCATION: Education details: Educated on the pathoanatomy of CVA. Educated on the goals and course of rehab. Education on falls reduction/prevention at home. Person educated: Patient and significant other Education method: Explanation Education comprehension: verbalized understanding  HOME EXERCISE PROGRAM: Access Code: [email or web address] URL: [email or web address] Date: 02/13/2023 Prepared by: Roseanne Reno Exercises - Seated Long Arc Quad  - 2 x daily - 7 x weekly - 1 sets - 20 reps - 5 sec hold - Sit to Stand  - 2 x daily - 7 x weekly - 1 sets - 10 reps - Standing Hip Abduction  - 2 x  daily - 7 x weekly - 1 sets - 20 reps - Standing Hip Extension  - 2 x daily - 7 x weekly - 1 sets - 20 reps - Standing Hip Flexion  - 2 x daily - 7 x weekly - 1 sets - 20 reps - 5 sec hold   GOALS: Goals reviewed with patient? Yes  SHORT TERM GOALS: Target date: 03/11/2023  Pt will demonstrate indep in HEP to facilitate carry-over of skilled services and improve functional outcomes  Goal status: IN PROGRESS  LONG TERM GOALS: Target date: 04/07/2023  Pt will have a decrease in TUG score by at least 3 sec in order to demonstrate clinically significant improvement in community ambulation Baseline: 22.83 sec Goal status: IN PROGRESS  2.  Pt will decrease 5TSTS by at least 3 seconds in order to demonstrate clinically significant improvement in LE strength  Baseline: 21.53 sec Goal status: IN PROGRESS  3.  Pt will increase 2MWT by at least 40 ft in order to demonstrate clinically significant improvement in community ambulation  Baseline: 207 ft Goal status: IN PROGRESS  4.  Pt will demonstrate increase in LE strength to 4+/5 to facilitate ease and safety in ambulation  Baseline: 3+/5 Goal status: IN  PROGRESS  5.  Pt will be able to walk on level surfaces > 100 ft without an assistive device with little to no gait deviation Goal status: IN PROGRESS  ASSESSMENT:  CLINICAL IMPRESSION: Continued focus on improving LE strength and stability.  Increased cuff weights to 3# for hip exercises with good challenge, patient needs reminders to avoid trunk substitution with hip exercises.  Of note patient tends to shift his weight to his right leg with sit to stand transfer.   Pt will continue to benefit from skilled physical therapy to address deficits and improve functional status.    OBJECTIVE IMPAIRMENTS: Abnormal gait, decreased balance, decreased endurance, decreased mobility, difficulty walking, decreased strength, and impaired flexibility.   ACTIVITY LIMITATIONS: lifting, bending,  sitting, standing, squatting, stairs, transfers, and locomotion level  PARTICIPATION LIMITATIONS: meal prep, cleaning, laundry, driving, shopping, and community activity  PERSONAL FACTORS: Fitness are also affecting patient's functional outcome.   REHAB POTENTIAL: Good  CLINICAL DECISION MAKING: Stable/uncomplicated  EVALUATION COMPLEXITY: Low  PLAN:  PT FREQUENCY: 2x/week  PT DURATION: 8 weeks  PLANNED INTERVENTIONS: Therapeutic exercises, Therapeutic activity, Neuromuscular re-education, Balance training, Gait training, Patient/Family education, Self Care, Stair training, and Electrical stimulation  PLAN FOR NEXT SESSION:  Progress strengthening, balance and gait training.  2:30 PM, 03/06/23 Cyndra Feinberg Small Ihan Pat MPT [facility] [place] [phone]

## 2023-03-08 ENCOUNTER — Ambulatory Visit (HOSPITAL_COMMUNITY): Payer: Medicaid Other | Admitting: Occupational Therapy

## 2023-03-08 ENCOUNTER — Encounter (HOSPITAL_COMMUNITY): Payer: Self-pay | Admitting: Occupational Therapy

## 2023-03-08 ENCOUNTER — Ambulatory Visit (HOSPITAL_COMMUNITY): Payer: Medicaid Other

## 2023-03-08 DIAGNOSIS — R262 Difficulty in walking, not elsewhere classified: Secondary | ICD-10-CM

## 2023-03-08 DIAGNOSIS — R278 Other lack of coordination: Secondary | ICD-10-CM

## 2023-03-08 DIAGNOSIS — I69354 Hemiplegia and hemiparesis following cerebral infarction affecting left non-dominant side: Secondary | ICD-10-CM | POA: Diagnosis not present

## 2023-03-08 DIAGNOSIS — R29818 Other symptoms and signs involving the nervous system: Secondary | ICD-10-CM

## 2023-03-08 DIAGNOSIS — Z7689 Persons encountering health services in other specified circumstances: Secondary | ICD-10-CM | POA: Diagnosis not present

## 2023-03-08 DIAGNOSIS — R29898 Other symptoms and signs involving the musculoskeletal system: Secondary | ICD-10-CM | POA: Diagnosis not present

## 2023-03-08 NOTE — Therapy (Signed)
OUTPATIENT PHYSICAL THERAPY TREATMENT Patient Name: Robert Lyons MRN: [id number] DOB:Jul 14, 1961, 62 y.o., male Today's Date: 03/08/2023   PCP: [name] MD  REFERRING PROVIDER: Roxan Hockey, MD  END OF SESSION:   PT End of Session - 03/08/23 1435     Visit Number 6    Number of Visits 16    Date for PT Re-Evaluation 04/07/23    Authorization Type [place] Medicaid [employer]    Authorization Time Period 12 visits approved 2/16-4/16    Authorization - Visit Number 5    Authorization - Number of Visits 12    Progress Note Due on Visit 8    PT Start Time 1435    PT Stop Time 1515    PT Time Calculation (min) 40 min    Equipment Utilized During Treatment Gait belt    Activity Tolerance Patient tolerated treatment well    Behavior During Therapy WFL for tasks assessed/performed             Past Medical History:  Diagnosis Date   GERD (gastroesophageal reflux disease)    Hypertension    Pneumonia    Type 2 diabetes mellitus ([place])    Past Surgical History:  Procedure Laterality Date   No prior surgery     Patient Active Problem List   Diagnosis Date Noted   Paratracheal lymphadenopathy 01/31/2023   Acute CVA (cerebrovascular accident) ([place]) 01/18/2023   Abdominal pain 01/18/2023   Chest pain 01/18/2023   Tobacco abuse 01/18/2023   Hypokalemia 01/18/2023   Vitamin D deficiency 01/05/2023   GERD (gastroesophageal reflux disease) 12/08/2022   Cataracts, both eyes 12/08/2022   Encounter for general adult medical examination with abnormal findings 12/08/2022   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[place]) [id number]   Essential hypertension 07/10/2017   Hyperlipidemia 07/10/2017   Cigarette nicotine dependence without complication [id number]    ONSET DATE: 01/18/2023  REFERRING DIAG: I63.9 (ICD-10-CM) - Acute CVA (cerebrovascular accident) ([place])  THERAPY DIAG:  Difficulty walking  Weakness of left lower extremity  Rationale for  Evaluation and Treatment: Rehabilitation  SUBJECTIVE:                                                                                                                                                                                             SUBJECTIVE STATEMENT: Patient states that he's doing better. Claims that the L LE was hurting a while ago (5/10) but the pain is gone now.  Evaluation: Patient states that everything on the L LE went to a "limp" since he had the stroke in January 2024. Also reports of numbness on the  LEs. Patient was admitted to the hospital when he had a stroke in 01/18/2023 and was D/C the other night. Denies getting rehabilitation services while at the hospital but he was immediately referred to outpatient PT evaluation and management. Patient is also receiving outpatient OT services at this time.  Pt accompanied by: significant other  PERTINENT HISTORY: DM, HTN  PAIN:  Are you having pain? Yes: NPRS scale: 2/10 Pain location: L LE Pain description: sharp, intermittent Aggravating factors: comes and goes randomly Relieving factors: comes and goes randomly  PRECAUTIONS: None  WEIGHT BEARING RESTRICTIONS: No  FALLS: Has patient fallen in last 6 months? No  LIVING ENVIRONMENT: Lives with:  significant other Lives in: House/apartment Stairs: No Has following equipment at home: Single point cane and shower chair  PLOF: Independent  PATIENT GOALS: "to get my strength back"  OBJECTIVE:   DIAGNOSTIC FINDINGS:  MRI Brain without contrast 01/18/2023 IMPRESSION: 1. 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 2. No intracranial large vessel occlusion or significant stenosis.  COGNITION: Overall cognitive status: Within functional limits for tasks assessed   SENSATION: Patient reports that the L LE has can feel "more" than the R as to light touch  COORDINATION: Moderately impaired on the L LE as to heel-to-shin No difficulty with alternating seated foot  taps  MUSCLE LENGTH: Moderate tightness on B hamstrings and gastrocnemius  POSTURE: rounded shoulders, forward head, and L knee slightly flexed in standing  LOWER EXTREMITY ROM:     Active  Right Eval Left Eval  Hip flexion [facility]  Hip extension [facility]  Hip abduction [facility]  Hip adduction    Hip internal rotation    Hip external rotation    Knee flexion [facility] WFL  Knee extension [facility]  Ankle dorsiflexion [facility] WFL  Ankle plantarflexion [facility] WFL  Ankle inversion    Ankle eversion     (Blank rows = not tested)  LOWER EXTREMITY MMT:    MMT Right Eval Left Eval  Hip flexion 5 3+  Hip extension 4 3+  Hip abduction 4+ 3+  Hip adduction    Hip internal rotation    Hip external rotation    Knee flexion 5 4-  Knee extension 4 3+  Ankle dorsiflexion 5 3+  Ankle plantarflexion 5 3+  Ankle inversion    Ankle eversion    (Blank rows = not tested)  BED MOBILITY:  Sit to supine Complete Independence Supine to sit Complete Independence Rolling to Right Complete Independence Rolling to Left Complete Independence  TRANSFERS: Assistive device utilized: None  Sit to stand: SBA Stand to sit: SBA  GAIT: Gait pattern: decreased step length- Right, decreased stance time- Left, decreased stride length, decreased hip/knee flexion- Left, decreased ankle dorsiflexion- Left, Left hip hike, knee flexed in stance- Left, lateral lean- Right, narrow BOS, poor foot clearance- Right, and poor foot clearance- Left.  Distance walked: 207 ft Assistive device utilized: Single point cane Level of assistance: CGA Comments: moderately unsteady  FUNCTIONAL TESTS:  5 times sit to stand: 21.53 sec Timed up and go (TUG): 22.83 sec 2 minute walk test: 207 ft  TODAY'S TREATMENT:  DATE:  03/08/2023 Nustep level 3, 5 minutes, LE only, seat 10, 80-90 SPM Gastrocnemius slant  board stretch x 30" x 3 Seated hamstring stretch x 30" x 3 Sit-to-stand on a standard chair, LLE slightly behind, with UE assist x 5 Sit-to-stand slightly elevated seat, B LE slightly behind x 5, no UE assist Standing L TKE, RTB x 3" x 10 x 2 Walking backwards x 40 ft x 2 rounds, CGA Forward stepping over cone obstacles ~ 5 ft x 2 rounds, CGA Side stepping over cone obstacles ~ 5 ft x 2 rounds, CGA   03/06/2023 Nustep level 2 5 minutes UE/LE seat 10  Standing: Heel raises x 20 Marching 3# x 20 Hip abduction 3# x 20 Hip extension 3# x 20 4" box step ups x 10 each 4" box lateral step ups x 10 Tandem stance 2 x 30"  Sit to stand x 10 no UE assist  02/27/2023 Nustep level 2 5 minutes UE/LE seat 10 Standing:  heelraise 20X  Hip abduction 20X each 2#  Hip extension 20X each 2#  Marching alternating with 5" holds 10X each 2#  4" step up laterals 20X each  4" lunge with 1 UE assist 20X each  Tandem stance 30" each lead  Vector 10X5" with 1 UE assist each LE Sit to stands 10X no UE from standard chair  02/22/2023 Nustep level 2 5 minutes UE/LE seat 10 Standing:  heelraise 20X  Hip abduction 20X each  Hip extension 20X each  Marching alternating with 5" holds 10X each  4" step up laterals 20X each  4" lunge with 1 UE assist 10X each  Tandem stance 30" each lead  Vector 10X5" with 1 UE assist  Squats 10X Sit to stands 10X no UE from standard chair Long arc quad 20X5" holds each LE  02/13/2023 Nustep level 2 5 minutes UE/LE seat 10 Standing:  heelraise 20X  Toeraise 20X  Hip abduction 20X each  Hip extension 20X each  Marching alternating with 5" holds 10X each  4" step up laterals 20X each Sit to stands 10X no UE from standard chair Long arc quad 20X5" holds each LE  02/10/23 Evaluation and patient education    PATIENT EDUCATION: Education details: Educated on the pathoanatomy of CVA. Educated on the goals and course of rehab. Education on falls reduction/prevention  at home. Person educated: Patient and significant other Education method: Explanation Education comprehension: verbalized understanding  HOME EXERCISE PROGRAM: Access Code: [email or web address] URL: [email or web address] Date: 02/13/2023 Prepared by: Roseanne Reno Exercises - Seated Long Arc Quad  - 2 x daily - 7 x weekly - 1 sets - 20 reps - 5 sec hold - Sit to Stand  - 2 x daily - 7 x weekly - 1 sets - 10 reps - Standing Hip Abduction  - 2 x daily - 7 x weekly - 1 sets - 20 reps - Standing Hip Extension  - 2 x daily - 7 x weekly - 1 sets - 20 reps - Standing Hip Flexion  - 2 x daily - 7 x weekly - 1 sets - 20 reps - 5 sec hold   GOALS: Goals reviewed with patient? Yes  SHORT TERM GOALS: Target date: 03/11/2023  Pt will demonstrate indep in HEP to facilitate carry-over of skilled services and improve functional outcomes  Goal status: IN PROGRESS  LONG TERM GOALS: Target date: 04/07/2023  Pt will have a decrease in TUG score by at least 3 sec in order to demonstrate  clinically significant improvement in community ambulation Baseline: 22.83 sec Goal status: IN PROGRESS  2.  Pt will decrease 5TSTS by at least 3 seconds in order to demonstrate clinically significant improvement in LE strength  Baseline: 21.53 sec Goal status: IN PROGRESS  3.  Pt will increase 2MWT by at least 40 ft in order to demonstrate clinically significant improvement in community ambulation  Baseline: 207 ft Goal status: IN PROGRESS  4.  Pt will demonstrate increase in LE strength to 4+/5 to facilitate ease and safety in ambulation  Baseline: 3+/5 Goal status: IN PROGRESS  5.  Pt will be able to walk on level surfaces > 100 ft without an assistive device with little to no gait deviation Goal status: IN PROGRESS  ASSESSMENT:  CLINICAL IMPRESSION: Interventions today were geared towards LE strengthening, flexibility and balance. Demonstrated mild levels of fatigue. Provided slight amount of cueing  to ensure correct execution of activity with fair to good carry-over. Patient had severe difficulty with sit-to-stand with the LLE positioned backwards as he still tends to favor the R LE due to weakness on the L. Several modifications were made to provide success. Patient demonstrated slight to mild unsteadiness on walking backwards and stepping over cones due to weakness and impaired proprioception. To date, skilled PT is required to address the impairments and improve function.    OBJECTIVE IMPAIRMENTS: Abnormal gait, decreased balance, decreased endurance, decreased mobility, difficulty walking, decreased strength, and impaired flexibility.   ACTIVITY LIMITATIONS: lifting, bending, sitting, standing, squatting, stairs, transfers, and locomotion level  PARTICIPATION LIMITATIONS: meal prep, cleaning, laundry, driving, shopping, and community activity  PERSONAL FACTORS: Fitness are also affecting patient's functional outcome.   REHAB POTENTIAL: Good  CLINICAL DECISION MAKING: Stable/uncomplicated  EVALUATION COMPLEXITY: Low  PLAN:  PT FREQUENCY: 2x/week  PT DURATION: 8 weeks  PLANNED INTERVENTIONS: Therapeutic exercises, Therapeutic activity, Neuromuscular re-education, Balance training, Gait training, Patient/Family education, Self Care, Stair training, and Electrical stimulation  PLAN FOR NEXT SESSION:  Progress strengthening, balance and gait training.  2:39 PM, 03/08/23 Harvie Heck. Zaylei Mullane, PT, DPT, OCS Board-Certified Clinical Specialist in [place] # ([place]): [phone] T

## 2023-03-08 NOTE — Patient Instructions (Signed)
Home Exercises Program Theraputty Exercises  Do the following exercises 2-3 times a day using your affected hand.  1. Roll putty into a ball.  2. Make into a pancake.  3. Roll putty into a roll.  4. Pinch along log with first finger and thumb.   5. Make into a ball.  6. Roll it back into a log.   7. Pinch using thumb and side of first finger.  8. Roll into a ball, then flatten into a pancake.  9. Using your fingers, make putty into a mountain.  10. Roll putty back into a ball and squeeze gently for 2-3 minutes.   

## 2023-03-08 NOTE — Therapy (Signed)
OUTPATIENT OCCUPATIONAL THERAPY NEURO TREATMENT NOTE  Patient Name: Robert Lyons MRN: [id number] DOB:1961-09-07, 62 y.o., male Today's Date: 03/08/2023  PCP: [name], MD REFERRING PROVIDER: Roxan Hockey, MD  END OF SESSION:  OT End of Session - 03/08/23 1354     Visit Number 7    Number of Visits 9    Date for OT Re-Evaluation 03/17/23    Authorization Type Managed medicaid    Authorization Time Period 8 visits (02/10/23-04/01/23)    Authorization - Visit Number 6    Authorization - Number of Visits 8    OT Start Time 1350    OT Stop Time 1430    OT Time Calculation (min) 40 min    Activity Tolerance Patient tolerated treatment well    Behavior During Therapy [facility] for tasks assessed/performed             Past Medical History:  Diagnosis Date   GERD (gastroesophageal reflux disease)    Hypertension    Pneumonia    Type 2 diabetes mellitus ([place])    Past Surgical History:  Procedure Laterality Date   No prior surgery     Patient Active Problem List   Diagnosis Date Noted   Paratracheal lymphadenopathy 01/31/2023   Acute CVA (cerebrovascular accident) ([place]) 01/18/2023   Abdominal pain 01/18/2023   Chest pain 01/18/2023   Tobacco abuse 01/18/2023   Hypokalemia 01/18/2023   Vitamin D deficiency 01/05/2023   GERD (gastroesophageal reflux disease) 12/08/2022   Cataracts, both eyes 12/08/2022   Encounter for general adult medical examination with abnormal findings 12/08/2022   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[name]) [id number]   Essential hypertension 07/10/2017   Hyperlipidemia 07/10/2017   Cigarette nicotine dependence without complication [id number]    ONSET DATE: 01/18/23  REFERRING DIAG: R Pontine CVA  THERAPY DIAG:  Other lack of coordination  Hemiplegia and hemiparesis following cerebral infarction affecting left non-dominant side ([place])  Other symptoms and signs involving the nervous system  Rationale for  Evaluation and Treatment: Rehabilitation  SUBJECTIVE:   SUBJECTIVE STATEMENT: "My arm and leg just hurt so much sometimes." Pt accompanied by: significant other  PERTINENT HISTORY: Patient states that everything on the L LE went to a "limp" since he had the stroke in January 2024. Also reports of numbness on the LEs. Patient was admitted to the hospital when he had a stroke on 01/18/2023.  PRECAUTIONS: None  WEIGHT BEARING RESTRICTIONS: No  PAIN:  Are you having pain? No  FALLS: Has patient fallen in last 6 months? No  PATIENT GOALS: To get strength back in my L side.   OBJECTIVE:   HAND DOMINANCE: Right  ADLs: Overall ADLs: Pt reports difficulty getting his clothes on, states he gets mixed up with it at times. Additionally due to strength and endurance he has a difficult time cooking and cleaning. Pt's partner assists with medication management.   MOBILITY STATUS: Independent  POSTURE COMMENTS:  No Significant postural limitations Sitting balance: Moves/returns truncal midpoint >2 inches in all planes  ACTIVITY TOLERANCE: Activity tolerance: Pt reports that his endurance has been affected, where he has difficulty sustaining activities due to getting out of breath and weak.   FUNCTIONAL OUTCOME MEASURES: Quick Dash: 45.45  UPPER EXTREMITY ROM:    Active ROM Left eval  Shoulder flexion 132  Shoulder abduction 87  Shoulder internal rotation 90  Shoulder external rotation 45  Elbow flexion 69  Elbow extension 3  Wrist flexion 30  Wrist extension -2  Wrist ulnar deviation 28  Wrist radial deviation -5  Wrist pronation WFL  Wrist supination 45  (Blank rows = not tested)  UPPER EXTREMITY MMT:     MMT Left eval  Shoulder flexion 4/5  Shoulder abduction 4-/5  Shoulder adduction 4/5  Shoulder extension 4+/5  Shoulder internal rotation 4-/5  Shoulder external rotation 4-/5  Elbow flexion 4/5  Elbow extension 4+/5  Wrist flexion 4/5  Wrist extension 3+/5   Wrist ulnar deviation 4/5  Wrist radial deviation 3/5  Wrist pronation 5/5  Wrist supination 4+/5  (Blank rows = not tested)  HAND FUNCTION: Grip strength: Right: 70 lbs; Left: 20 lbs, Lateral pinch: Right: 20 lbs, Left: 3 lbs, and 3 point pinch: Right: 10 lbs, Left: 3 lbs  COORDINATION: 9 Hole Peg test: Right: 33.74 sec; Left: 46.55 sec  SENSATION: Light touch: Impaired   EDEMA: Mild edema noted in the L hand  COGNITION: Overall cognitive status: Within functional limits for tasks assessed  VISION: Subjective report: Pt reports he has severe cataracts in both eyes. Was supposed to have surgery, but it got postponed.  Baseline vision: Wears glasses all the time Visual history: cataracts  VISION ASSESSMENT: Not tested  Patient has difficulty with following activities due to following visual impairments: Driving, reading  PERCEPTION: WFL  PRAXIS: WFL  OBSERVATIONS: At times pt demonstrates some word finding difficulties.    TODAY'S TREATMENT:                                                                                                                              DATE:   03/08/23 -Scapular Strengthening: green theraband, extension, protraction, retraction, rows, x15 -Shoulder strengthening: green theraband, flexion, abduction, horizontal abduction, er, IR, x10 -Theraputty: red putty, push in 10 beads, roll into ball, pinch and pull to find 10 beads, roll into ball, squeeze x10  03/06/23 -Shoulder Strengthening: 2lb dumbbells, flexion, abduction, protraction, horizontal abduction, er/IR, x15 -Gripper: 35lbs picking up 10 medium beads, 42lbs picking up 6 medium beads, 45lbs squeeze x10 -Digiflex: 7lb full squeeze x15, each digit x8 -Tiny peg board pattern  02/27/23 -Strengthening: 5lb dowel bar, flexion, overhead press, protraction, abduction, bicep curls, x10 -Wrist Strengthening: 3lb dumbbell, flexion, extension, ulnar/radial deviation, supination, pronation,  x10 -Theraputty: green putty, roll into a ball, flatten into pancake, use PVC pipe to "cut cookies", roll into log, tripod pinch x10, lateral pinch x10, roll into ball -Grooved Peg Board: 5' to fill entire board    PATIENT EDUCATION: Education details: Theraputty Person educated: Patient and Spouse Education method: Explanation Education comprehension: verbalized understanding  HOME EXERCISE PROGRAM: 2/19: Weight bearing (reaching, weight shifting) 2/26: Coins and card tasks 2/28: Wrist Strengthening 3/4: Shoulder Strengthening 3/13: Theraputty    GOALS: Goals reviewed with patient? Yes  SHORT TERM GOALS: Target date: 03/17/23  Pt will be provided with and educated on HEP to improve mobility in LUE required for ADL completion.  Goal status: IN PROGRESS  2.  Pt will improve strength to 4+/5 in order to improve ability to perform lifting tasks during meal preparation and cleaning tasks.  Goal status: IN PROGRESS  3.  Pt will increase left grip strength by 10# and pinch strength by 3# to improve ability to grasp and hold pots and pans during simple meal preparation.  Goal status: IN PROGRESS  4.  Pt will increase left hand coordination by completing 9 hole peg test in under 35 seconds, improving ability to manipulate lids and tops on jars, bottles, etc.  Goal status: IN PROGRESS  5.  Pt will improve LUE motor planning by completing functional reaching tasks with minimal compensatory assist from scapular and trapezius regions.  Goal status: IN PROGRESS  6. Pt will increase A/ROM of LUE by 40 degrees in all directions in order to reach overhead and behind back during dressing and bathing tasks.   Goal status: IN PROGRESS   ASSESSMENT:  CLINICAL IMPRESSION: This session pt continuing to work on his overall strengthening. OT upgraded him to the green theraband for all shoulder and scapular strengthening this session. Pt continues to report that his hand and arm still feel weak  and he feels like can't manipulate things right still. He required increased time to manipulate the theraputty due to weakness within his hand. OT providing cuing for positioning and technique throughout the session.   PERFORMANCE DEFICITS: in functional skills including ADLs, IADLs, coordination, sensation, ROM, strength, muscle spasms, Fine motor control, Gross motor control, body mechanics, vision, and UE functional use, cognitive skills including attention, memory, problem solving, and safety awareness.   PLAN:  OT FREQUENCY: 2x/week  OT DURATION: 4 weeks  PLANNED INTERVENTIONS: self care/ADL training, therapeutic exercise, therapeutic activity, neuromuscular re-education, passive range of motion, functional mobility training, electrical stimulation, ultrasound, paraffin, moist heat, cryotherapy, patient/family education, cognitive remediation/compensation, energy conservation, and DME and/or AE instructions  RECOMMENDED OTHER SERVICES: PT and Speech for Cognition  CONSULTED AND AGREED WITH PLAN OF CARE: Patient  PLAN FOR NEXT SESSION: A/ROM, Weight bearing, Closed chain exercises, fine motor tasks, strengthening tasks.    Paulita Fujita, OTR/L [facility] [place], [place] 03/08/2023, 1:56 PM

## 2023-03-13 ENCOUNTER — Ambulatory Visit: Payer: Medicaid Other | Admitting: Gastroenterology

## 2023-03-13 ENCOUNTER — Ambulatory Visit (HOSPITAL_COMMUNITY): Payer: Medicaid Other | Admitting: Occupational Therapy

## 2023-03-13 ENCOUNTER — Ambulatory Visit (HOSPITAL_COMMUNITY): Payer: Medicaid Other | Admitting: Physical Therapy

## 2023-03-13 DIAGNOSIS — R262 Difficulty in walking, not elsewhere classified: Secondary | ICD-10-CM | POA: Diagnosis not present

## 2023-03-13 DIAGNOSIS — R278 Other lack of coordination: Secondary | ICD-10-CM

## 2023-03-13 DIAGNOSIS — R29818 Other symptoms and signs involving the nervous system: Secondary | ICD-10-CM

## 2023-03-13 DIAGNOSIS — R29898 Other symptoms and signs involving the musculoskeletal system: Secondary | ICD-10-CM | POA: Diagnosis not present

## 2023-03-13 DIAGNOSIS — I69354 Hemiplegia and hemiparesis following cerebral infarction affecting left non-dominant side: Secondary | ICD-10-CM | POA: Diagnosis not present

## 2023-03-13 DIAGNOSIS — Z7689 Persons encountering health services in other specified circumstances: Secondary | ICD-10-CM | POA: Diagnosis not present

## 2023-03-13 NOTE — Therapy (Signed)
OUTPATIENT OCCUPATIONAL THERAPY NEURO TREATMENT NOTE  Patient Name: Robert Lyons MRN: [id number] DOB:November 29, 1961, 62 y.o., male Today's Date: 03/15/2023  PCP: [name], MD REFERRING PROVIDER: Roxan Hockey, MD  END OF SESSION:   03/13/23 1515  OT Visits / Re-Eval  Visit Number 8  Number of Visits 9  Date for OT Re-Evaluation 03/17/23  Authorization  Authorization Type Managed medicaid  Authorization Time Period 8 visits (02/10/23-04/01/23)  Authorization - Visit Number 7  Authorization - Number of Visits 8  OT Time Calculation  OT Start Time 1305  OT Stop Time 1345  OT Time Calculation (min) 40 min  End of Session  Activity Tolerance Patient tolerated treatment well  Behavior During Therapy [facility] for tasks assessed/performed    Past Medical History:  Diagnosis Date   GERD (gastroesophageal reflux disease)    Hypertension    Pneumonia    Type 2 diabetes mellitus ([place])    Past Surgical History:  Procedure Laterality Date   No prior surgery     Patient Active Problem List   Diagnosis Date Noted   Paratracheal lymphadenopathy 01/31/2023   Acute CVA (cerebrovascular accident) ([place]) 01/18/2023   Abdominal pain 01/18/2023   Chest pain 01/18/2023   Tobacco abuse 01/18/2023   Hypokalemia 01/18/2023   Vitamin D deficiency 01/05/2023   GERD (gastroesophageal reflux disease) 12/08/2022   Cataracts, both eyes 12/08/2022   Encounter for general adult medical examination with abnormal findings 12/08/2022   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[place]) [id number]   Essential hypertension 07/10/2017   Hyperlipidemia 07/10/2017   Cigarette nicotine dependence without complication [id number]    ONSET DATE: 01/18/23  REFERRING DIAG: R Pontine CVA  THERAPY DIAG:  Other lack of coordination  Hemiplegia and hemiparesis following cerebral infarction affecting left non-dominant side ([place])  Other symptoms and signs involving the nervous  system  Rationale for Evaluation and Treatment: Rehabilitation  SUBJECTIVE:   SUBJECTIVE STATEMENT: "My arm and leg just hurt so much sometimes." Pt accompanied by: significant other  PERTINENT HISTORY: Patient states that everything on the L LE went to a "limp" since he had the stroke in January 2024. Also reports of numbness on the LEs. Patient was admitted to the hospital when he had a stroke on 01/18/2023.  PRECAUTIONS: None  WEIGHT BEARING RESTRICTIONS: No  PAIN:  Are you having pain? No  FALLS: Has patient fallen in last 6 months? No  PATIENT GOALS: To get strength back in my L side.   OBJECTIVE:   HAND DOMINANCE: Right  ADLs: Overall ADLs: Pt reports difficulty getting his clothes on, states he gets mixed up with it at times. Additionally due to strength and endurance he has a difficult time cooking and cleaning. Pt's partner assists with medication management.   MOBILITY STATUS: Independent  POSTURE COMMENTS:  No Significant postural limitations Sitting balance: Moves/returns truncal midpoint >2 inches in all planes  ACTIVITY TOLERANCE: Activity tolerance: Pt reports that his endurance has been affected, where he has difficulty sustaining activities due to getting out of breath and weak.   FUNCTIONAL OUTCOME MEASURES: Quick Dash: 45.45  UPPER EXTREMITY ROM:    Active ROM Left eval  Shoulder flexion 132  Shoulder abduction 87  Shoulder internal rotation 90  Shoulder external rotation 45  Elbow flexion 69  Elbow extension 3  Wrist flexion 30  Wrist extension -2  Wrist ulnar deviation 28  Wrist radial deviation -5  Wrist pronation WFL  Wrist supination 45  (Blank rows = not  tested)  UPPER EXTREMITY MMT:     MMT Left eval  Shoulder flexion 4/5  Shoulder abduction 4-/5  Shoulder adduction 4/5  Shoulder extension 4+/5  Shoulder internal rotation 4-/5  Shoulder external rotation 4-/5  Elbow flexion 4/5  Elbow extension 4+/5  Wrist flexion 4/5   Wrist extension 3+/5  Wrist ulnar deviation 4/5  Wrist radial deviation 3/5  Wrist pronation 5/5  Wrist supination 4+/5  (Blank rows = not tested)  HAND FUNCTION: Grip strength: Right: 70 lbs; Left: 20 lbs, Lateral pinch: Right: 20 lbs, Left: 3 lbs, and 3 point pinch: Right: 10 lbs, Left: 3 lbs  COORDINATION: 9 Hole Peg test: Right: 33.74 sec; Left: 46.55 sec  SENSATION: Light touch: Impaired   EDEMA: Mild edema noted in the L hand  COGNITION: Overall cognitive status: Within functional limits for tasks assessed  VISION: Subjective report: Pt reports he has severe cataracts in both eyes. Was supposed to have surgery, but it got postponed.  Baseline vision: Wears glasses all the time Visual history: cataracts  VISION ASSESSMENT: Not tested  Patient has difficulty with following activities due to following visual impairments: Driving, reading  PERCEPTION: WFL  PRAXIS: WFL  OBSERVATIONS: At times pt demonstrates some word finding difficulties.    TODAY'S TREATMENT:                                                                                                                              DATE:   03/13/23 -Shoulder Strengthening: 3lb dumbbell, flexion, abduction, protraction, horizontal abduction, er/IR, x10 -Wrist Strengthening: 3lb dumbbell, flexion, extension, ulnar/radial deviation, supination/pronation, x10 -Red rubber band: abduction, finger taps, x10 -Weighted Ball: ABC's -Gripper: attempted 45lb and 42lbs, unable to pick up beads this session squeeze x10 each, 35lbs picking up 5 medium beads -Pinch Strengthening: green resistance clip, picking up large pegs and placing on peg board per pattern  03/08/23 -Scapular Strengthening: green theraband, extension, protraction, retraction, rows, x15 -Shoulder strengthening: green theraband, flexion, abduction, horizontal abduction, er, IR, x10 -Theraputty: red putty, push in 10 beads, roll into ball, pinch and pull to  find 10 beads, roll into ball, squeeze x10  03/06/23 -Shoulder Strengthening: 2lb dumbbells, flexion, abduction, protraction, horizontal abduction, er/IR, x15 -Gripper: 35lbs picking up 10 medium beads, 42lbs picking up 6 medium beads, 45lbs squeeze x10 -Digiflex: 7lb full squeeze x15, each digit x8 -Tiny peg board pattern    PATIENT EDUCATION: Education details: Review HEP Person educated: Patient and Spouse Education method: Explanation Education comprehension: verbalized understanding  HOME EXERCISE PROGRAM: 2/19: Weight bearing (reaching, weight shifting) 2/26: Coins and card tasks 2/28: Wrist Strengthening 3/4: Shoulder Strengthening 3/13: Theraputty    GOALS: Goals reviewed with patient? Yes  SHORT TERM GOALS: Target date: 03/17/23  Pt will be provided with and educated on HEP to improve mobility in LUE required for ADL completion.  Goal status: IN PROGRESS  2.  Pt will improve strength to 4+/5 in order to improve ability  to perform lifting tasks during meal preparation and cleaning tasks.  Goal status: IN PROGRESS  3.  Pt will increase left grip strength by 10# and pinch strength by 3# to improve ability to grasp and hold pots and pans during simple meal preparation.  Goal status: IN PROGRESS  4.  Pt will increase left hand coordination by completing 9 hole peg test in under 35 seconds, improving ability to manipulate lids and tops on jars, bottles, etc.  Goal status: IN PROGRESS  5.  Pt will improve LUE motor planning by completing functional reaching tasks with minimal compensatory assist from scapular and trapezius regions.  Goal status: IN PROGRESS  6. Pt will increase A/ROM of LUE by 40 degrees in all directions in order to reach overhead and behind back during dressing and bathing tasks.   Goal status: IN PROGRESS   ASSESSMENT:  CLINICAL IMPRESSION: This session pt complaining of increased fatigue and weakness. He had difficulty with 3lb dumbbells during  shoulder strengthening, as well as 45lbs and 42lbs with the gripper. He completed all tasks this session, however he required rest breaks and compensatory strategies or less weight to allow for completion of the tasks. OT reviewed HEP with pt to ensure that he was completing exercises at home, which he reports moderate compliance. Verbal and tactile cuing provided throughout session for positioning and technique.   PERFORMANCE DEFICITS: in functional skills including ADLs, IADLs, coordination, sensation, ROM, strength, muscle spasms, Fine motor control, Gross motor control, body mechanics, vision, and UE functional use, cognitive skills including attention, memory, problem solving, and safety awareness.   PLAN:  OT FREQUENCY: 2x/week  OT DURATION: 4 weeks  PLANNED INTERVENTIONS: self care/ADL training, therapeutic exercise, therapeutic activity, neuromuscular re-education, passive range of motion, functional mobility training, electrical stimulation, ultrasound, paraffin, moist heat, cryotherapy, patient/family education, cognitive remediation/compensation, energy conservation, and DME and/or AE instructions  RECOMMENDED OTHER SERVICES: PT and Speech for Cognition  CONSULTED AND AGREED WITH PLAN OF CARE: Patient  PLAN FOR NEXT SESSION: A/ROM, Restart Weight bearing, Closed chain exercises, fine motor tasks, strengthening tasks.    Paulita Fujita, OTR/L [facility] [place], [place] 03/15/2023, 8:56 AM

## 2023-03-13 NOTE — Therapy (Signed)
OUTPATIENT PHYSICAL THERAPY TREATMENT Patient Name: Robert Lyons MRN: [id number] DOB:07/27/1961, 62 y.o., male Today's Date: 03/13/2023   PCP: [name] MD  REFERRING PROVIDER: Roxan Hockey, MD  END OF SESSION:   PT End of Session - 03/13/23 1358     Visit Number 7    Number of Visits 16    Date for PT Re-Evaluation 04/07/23    Authorization Type [place] [employer]    Authorization Time Period 12 visits approved 2/16-4/16    Authorization - Visit Number 6    Authorization - Number of Visits 12    Progress Note Due on Visit 8    PT Start Time 1352    PT Stop Time 1430    PT Time Calculation (min) 38 min    Equipment Utilized During Treatment Gait belt    Activity Tolerance Patient tolerated treatment well    Behavior During Therapy WFL for tasks assessed/performed             Past Medical History:  Diagnosis Date   GERD (gastroesophageal reflux disease)    Hypertension    Pneumonia    Type 2 diabetes mellitus ([place])    Past Surgical History:  Procedure Laterality Date   No prior surgery     Patient Active Problem List   Diagnosis Date Noted   Paratracheal lymphadenopathy 01/31/2023   Acute CVA (cerebrovascular accident) ([place]) 01/18/2023   Abdominal pain 01/18/2023   Chest pain 01/18/2023   Tobacco abuse 01/18/2023   Hypokalemia 01/18/2023   Vitamin D deficiency 01/05/2023   GERD (gastroesophageal reflux disease) 12/08/2022   Cataracts, both eyes 12/08/2022   Encounter for general adult medical examination with abnormal findings 12/08/2022   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[place]) [id number]   Essential hypertension 07/10/2017   Hyperlipidemia 07/10/2017   Cigarette nicotine dependence without complication [id number]    ONSET DATE: 01/18/2023  REFERRING DIAG: I63.9 (ICD-10-CM) - Acute CVA (cerebrovascular accident) ([place])  THERAPY DIAG:  Difficulty walking  Weakness of left lower extremity  Other lack of  coordination  Rationale for Evaluation and Treatment: Rehabilitation  SUBJECTIVE:                                                                                                                                                                                             SUBJECTIVE STATEMENT: Patient states he is doing some exercises at home.  Just finished up with OT.  No pain or issues.   Evaluation: Patient states that everything on the L LE went to a "limp" since he had the stroke in January 2024. Also reports of  numbness on the LEs. Patient was admitted to the hospital when he had a stroke in 01/18/2023 and was D/C the other night. Denies getting rehabilitation services while at the hospital but he was immediately referred to outpatient PT evaluation and management. Patient is also receiving outpatient OT services at this time.  Pt accompanied by: significant other  PERTINENT HISTORY: DM, HTN  PAIN:  Are you having pain? Yes: NPRS scale: 2/10 Pain location: L LE Pain description: sharp, intermittent Aggravating factors: comes and goes randomly Relieving factors: comes and goes randomly  PRECAUTIONS: None  WEIGHT BEARING RESTRICTIONS: No  FALLS: Has patient fallen in last 6 months? No  LIVING ENVIRONMENT: Lives with:  significant other Lives in: House/apartment Stairs: No Has following equipment at home: Single point cane and shower chair  PLOF: Independent  PATIENT GOALS: "to get my strength back"  OBJECTIVE:   DIAGNOSTIC FINDINGS:  MRI Brain without contrast 01/18/2023 IMPRESSION: 1. 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 2. No intracranial large vessel occlusion or significant stenosis.  COGNITION: Overall cognitive status: Within functional limits for tasks assessed   SENSATION: Patient reports that the L LE has can feel "more" than the R as to light touch  COORDINATION: Moderately impaired on the L LE as to heel-to-shin No difficulty with alternating  seated foot taps  MUSCLE LENGTH: Moderate tightness on B hamstrings and gastrocnemius  POSTURE: rounded shoulders, forward head, and L knee slightly flexed in standing  LOWER EXTREMITY ROM:     Active  Right Eval Left Eval  Hip flexion [facility]  Hip extension [facility]  Hip abduction [facility]  Hip adduction    Hip internal rotation    Hip external rotation    Knee flexion [facility] WFL  Knee extension [facility]  Ankle dorsiflexion [facility] WFL  Ankle plantarflexion [facility] WFL  Ankle inversion    Ankle eversion     (Blank rows = not tested)  LOWER EXTREMITY MMT:    MMT Right Eval Left Eval  Hip flexion 5 3+  Hip extension 4 3+  Hip abduction 4+ 3+  Hip adduction    Hip internal rotation    Hip external rotation    Knee flexion 5 4-  Knee extension 4 3+  Ankle dorsiflexion 5 3+  Ankle plantarflexion 5 3+  Ankle inversion    Ankle eversion    (Blank rows = not tested)  BED MOBILITY:  Sit to supine Complete Independence Supine to sit Complete Independence Rolling to Right Complete Independence Rolling to Left Complete Independence  TRANSFERS: Assistive device utilized: None  Sit to stand: SBA Stand to sit: SBA  GAIT: Gait pattern: decreased step length- Right, decreased stance time- Left, decreased stride length, decreased hip/knee flexion- Left, decreased ankle dorsiflexion- Left, Left hip hike, knee flexed in stance- Left, lateral lean- Right, narrow BOS, poor foot clearance- Right, and poor foot clearance- Left.  Distance walked: 207 ft Assistive device utilized: Single point cane Level of assistance: CGA Comments: moderately unsteady  FUNCTIONAL TESTS:  5 times sit to stand: 21.53 sec Timed up and go (TUG): 22.83 sec 2 minute walk test: 207 ft  TODAY'S TREATMENT:  DATE:  03/13/2023 Nustep level 3, 5 minutes, LE only, seat 10, 80-90  SPM Standing: Gastrocnemius slant board stretch x 30" x 3 Lunges onto 4" no UE 2X10 each 4" lateral step ups 2X10 each 1 UE 4" forward step ups 2X10 each 1 UE Seated: Sit-to-stand on a standard chair, LLE slightly behind, with UE assist x 5  03/08/2023 Nustep level 3, 5 minutes, LE only, seat 10, 80-90 SPM Gastrocnemius slant board stretch x 30" x 3 Seated hamstring stretch x 30" x 3 Sit-to-stand on a standard chair, LLE slightly behind, with UE assist x 5 Sit-to-stand slightly elevated seat, B LE slightly behind x 5, no UE assist Standing L TKE, RTB x 3" x 10 x 2 Walking backwards x 40 ft x 2 rounds, CGA Forward stepping over cone obstacles ~ 5 ft x 2 rounds, CGA Side stepping over cone obstacles ~ 5 ft x 2 rounds, CGA   03/06/2023 Nustep level 2 5 minutes UE/LE seat 10  Standing: Heel raises x 20 Marching 3# x 20 Hip abduction 3# x 20 Hip extension 3# x 20 4" box step ups x 10 each 4" box lateral step ups x 10 Tandem stance 2 x 30"  Sit to stand x 10 no UE assist  02/27/2023 Nustep level 2 5 minutes UE/LE seat 10 Standing:  heelraise 20X  Hip abduction 20X each 2#  Hip extension 20X each 2#  Marching alternating with 5" holds 10X each 2#  4" step up laterals 20X each  4" lunge with 1 UE assist 20X each  Tandem stance 30" each lead  Vector 10X5" with 1 UE assist each LE Sit to stands 10X no UE from standard chair  02/22/2023 Nustep level 2 5 minutes UE/LE seat 10 Standing:  heelraise 20X  Hip abduction 20X each  Hip extension 20X each  Marching alternating with 5" holds 10X each  4" step up laterals 20X each  4" lunge with 1 UE assist 10X each  Tandem stance 30" each lead  Vector 10X5" with 1 UE assist  Squats 10X Sit to stands 10X no UE from standard chair Long arc quad 20X5" holds each LE  02/13/2023 Nustep level 2 5 minutes UE/LE seat 10 Standing:  heelraise 20X  Toeraise 20X  Hip abduction 20X each  Hip extension 20X each  Marching alternating with 5"  holds 10X each  4" step up laterals 20X each Sit to stands 10X no UE from standard chair Long arc quad 20X5" holds each LE  02/10/23 Evaluation and patient education    PATIENT EDUCATION: Education details: Educated on the pathoanatomy of CVA. Educated on the goals and course of rehab. Education on falls reduction/prevention at home. Person educated: Patient and significant other Education method: Explanation Education comprehension: verbalized understanding  HOME EXERCISE PROGRAM: Access Code: [email or web address] URL: [email or web address] Date: 02/13/2023 Prepared by: Roseanne Reno Exercises - Seated Long Arc Quad  - 2 x daily - 7 x weekly - 1 sets - 20 reps - 5 sec hold - Sit to Stand  - 2 x daily - 7 x weekly - 1 sets - 10 reps - Standing Hip Abduction  - 2 x daily - 7 x weekly - 1 sets - 20 reps - Standing Hip Extension  - 2 x daily - 7 x weekly - 1 sets - 20 reps - Standing Hip Flexion  - 2 x daily - 7 x weekly - 1 sets - 20 reps - 5 sec hold   GOALS:  Goals reviewed with patient? Yes  SHORT TERM GOALS: Target date: 03/11/2023  Pt will demonstrate indep in HEP to facilitate carry-over of skilled services and improve functional outcomes  Goal status: IN PROGRESS  LONG TERM GOALS: Target date: 04/07/2023  Pt will have a decrease in TUG score by at least 3 sec in order to demonstrate clinically significant improvement in community ambulation Baseline: 22.83 sec Goal status: IN PROGRESS  2.  Pt will decrease 5TSTS by at least 3 seconds in order to demonstrate clinically significant improvement in LE strength  Baseline: 21.53 sec Goal status: IN PROGRESS  3.  Pt will increase 2MWT by at least 40 ft in order to demonstrate clinically significant improvement in community ambulation  Baseline: 207 ft Goal status: IN PROGRESS  4.  Pt will demonstrate increase in LE strength to 4+/5 to facilitate ease and safety in ambulation  Baseline: 3+/5 Goal status: IN  PROGRESS  5.  Pt will be able to walk on level surfaces > 100 ft without an assistive device with little to no gait deviation Goal status: IN PROGRESS  ASSESSMENT:  CLINICAL IMPRESSION: Continued with focus on LE strengthening, flexibility and balance. Pt reported he was tired today and having difficulty maintaining motivation to complete full session. PT was able to complete 7" stair negotiation reciprocally with use of 1 HR, slight gait deviation when descending, none with ascending.  Pt reported minimal challenge and without LOB or issues completing this session. Pt will continue to benefit from skilled PT to address impairments and improve function.    OBJECTIVE IMPAIRMENTS: Abnormal gait, decreased balance, decreased endurance, decreased mobility, difficulty walking, decreased strength, and impaired flexibility.   ACTIVITY LIMITATIONS: lifting, bending, sitting, standing, squatting, stairs, transfers, and locomotion level  PARTICIPATION LIMITATIONS: meal prep, cleaning, laundry, driving, shopping, and community activity  PERSONAL FACTORS: Fitness are also affecting patient's functional outcome.   REHAB POTENTIAL: Good  CLINICAL DECISION MAKING: Stable/uncomplicated  EVALUATION COMPLEXITY: Low  PLAN:  PT FREQUENCY: 2x/week  PT DURATION: 8 weeks  PLANNED INTERVENTIONS: Therapeutic exercises, Therapeutic activity, Neuromuscular re-education, Balance training, Gait training, Patient/Family education, Self Care, Stair training, and Electrical stimulation  PLAN FOR NEXT SESSION:  Progress strengthening, balance and gait training.  4:22 PM, 03/13/23 Teena Irani, PTA/CLT [place] Ph: [phone]

## 2023-03-15 ENCOUNTER — Ambulatory Visit (HOSPITAL_COMMUNITY): Payer: Medicaid Other | Admitting: Physical Therapy

## 2023-03-15 ENCOUNTER — Ambulatory Visit (HOSPITAL_COMMUNITY): Payer: Medicaid Other | Admitting: Occupational Therapy

## 2023-03-15 ENCOUNTER — Encounter (HOSPITAL_COMMUNITY): Payer: Self-pay | Admitting: Occupational Therapy

## 2023-03-15 DIAGNOSIS — R278 Other lack of coordination: Secondary | ICD-10-CM

## 2023-03-15 DIAGNOSIS — R262 Difficulty in walking, not elsewhere classified: Secondary | ICD-10-CM | POA: Diagnosis not present

## 2023-03-15 DIAGNOSIS — R29898 Other symptoms and signs involving the musculoskeletal system: Secondary | ICD-10-CM

## 2023-03-15 DIAGNOSIS — R29818 Other symptoms and signs involving the nervous system: Secondary | ICD-10-CM

## 2023-03-15 DIAGNOSIS — Z7689 Persons encountering health services in other specified circumstances: Secondary | ICD-10-CM | POA: Diagnosis not present

## 2023-03-15 DIAGNOSIS — I69354 Hemiplegia and hemiparesis following cerebral infarction affecting left non-dominant side: Secondary | ICD-10-CM | POA: Diagnosis not present

## 2023-03-15 NOTE — Therapy (Signed)
OUTPATIENT OCCUPATIONAL THERAPY NEURO TREATMENT NOTE AND PROGRESS NOTE  Patient Name: Robert Lyons MRN: [id number] DOB:02/14/1961, 62 y.o., male Today's Date: 03/15/2023  PCP: [name], MD REFERRING PROVIDER: Roxan Hockey, MD  Progress Note Reporting Period 02/10/23 to 03/15/23  See note below for Objective Data and Assessment of Progress/Goals.    END OF SESSION:  OT End of Session - 03/15/23 1437     Visit Number 9    Number of Visits 9    Date for OT Re-Evaluation 03/17/23    Authorization Type Managed medicaid    Authorization Time Period 8 visits (02/10/23-04/01/23)    Authorization - Visit Number 8    Authorization - Number of Visits 8    OT Start Time 1435    OT Stop Time 1515    OT Time Calculation (min) 40 min    Activity Tolerance Patient tolerated treatment well    Behavior During Therapy WFL for tasks assessed/performed            Past Medical History:  Diagnosis Date   GERD (gastroesophageal reflux disease)    Hypertension    Pneumonia    Type 2 diabetes mellitus ([place])    Past Surgical History:  Procedure Laterality Date   No prior surgery     Patient Active Problem List   Diagnosis Date Noted   Paratracheal lymphadenopathy 01/31/2023   Acute CVA (cerebrovascular accident) ([place]) 01/18/2023   Abdominal pain 01/18/2023   Chest pain 01/18/2023   Tobacco abuse 01/18/2023   Hypokalemia 01/18/2023   Vitamin D deficiency 01/05/2023   GERD (gastroesophageal reflux disease) 12/08/2022   Cataracts, both eyes 12/08/2022   Encounter for general adult medical examination with abnormal findings 12/08/2022   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[place]) [id number]   Essential hypertension 07/10/2017   Hyperlipidemia 07/10/2017   Cigarette nicotine dependence without complication [id number]    ONSET DATE: 01/18/23  REFERRING DIAG: [place] CVA  THERAPY DIAG:  Other lack of coordination  Hemiplegia and hemiparesis following  cerebral infarction affecting left non-dominant side ([place])  Other symptoms and signs involving the nervous system  Rationale for Evaluation and Treatment: Rehabilitation  SUBJECTIVE:   SUBJECTIVE STATEMENT: "My arm and leg just hurt so much sometimes." Pt accompanied by: significant other  PERTINENT HISTORY: Patient states that everything on the L LE went to a "limp" since he had the stroke in January 2024. Also reports of numbness on the LEs. Patient was admitted to the hospital when he had a stroke on 01/18/2023.  PRECAUTIONS: None  WEIGHT BEARING RESTRICTIONS: No  PAIN:  Are you having pain? No  FALLS: Has patient fallen in last 6 months? No  PATIENT GOALS: To get strength back in my L side.   OBJECTIVE:   HAND DOMINANCE: Right  ADLs: Overall ADLs: Pt reports difficulty getting his clothes on, states he gets mixed up with it at times. Additionally due to strength and endurance he has a difficult time cooking and cleaning. Pt's partner assists with medication management.   MOBILITY STATUS: Independent  POSTURE COMMENTS:  No Significant postural limitations Sitting balance: Moves/returns truncal midpoint >2 inches in all planes  ACTIVITY TOLERANCE: Activity tolerance: Pt reports that his endurance has been affected, where he has difficulty sustaining activities due to getting out of breath and weak.   FUNCTIONAL OUTCOME MEASURES: Quick Dash: 45.45 03/15/23: 36.36  UPPER EXTREMITY ROM:    Active ROM Left eval Left 03/15/23  Shoulder flexion 132 122  Shoulder  abduction 87 100  Shoulder internal rotation 90 90  Shoulder external rotation 45 72  Elbow flexion 69 139  Elbow extension -3 -3  Wrist flexion 30 75  Wrist extension -2 12  Wrist ulnar deviation 28 49  Wrist radial deviation -5 2  Wrist pronation [facility] WFL  Wrist supination 45 80  (Blank rows = not tested)  UPPER EXTREMITY MMT:     MMT Left eval Left 03/15/23  Shoulder flexion 4/5 4+/5  Shoulder  abduction 4-/5 4+/5  Shoulder adduction 4/5 4+/5  Shoulder extension 4+/5 5/5  Shoulder internal rotation 4-/5 4+/5  Shoulder external rotation 4-/5 4+/5  Elbow flexion 4/5 5/5  Elbow extension 4+/5 5/5  Wrist flexion 4/5 4+/5  Wrist extension 3+/5 4/5  Wrist ulnar deviation 4/5 4+/5  Wrist radial deviation 3/5 4-/5  Wrist pronation 5/5 5/5  Wrist supination 4+/5 5/5  (Blank rows = not tested)  HAND FUNCTION: Grip strength: Right: 70 lbs; Left: 20 lbs, Lateral pinch: Right: 20 lbs, Left: 3 lbs, and 3 point pinch: Right: 10 lbs, Left: 3 lbs Grip strength: 03/15/23; Left: 26 lbs, Lateral pinch: Left: 12 lbs, and 3 point pinch: Left: 6 lbs  COORDINATION: 9 Hole Peg test: Right: 33.74 sec; Left: 46.55 sec 9 Hole Peg test: 03/15/23; Left: 46.09 sec  SENSATION: Light touch: Impaired   EDEMA: Mild edema noted in the L hand  COGNITION: Overall cognitive status: Within functional limits for tasks assessed  VISION: Subjective report: Pt reports he has severe cataracts in both eyes. Was supposed to have surgery, but it got postponed.  Baseline vision: Wears glasses all the time Visual history: cataracts  VISION ASSESSMENT: Not tested  Patient has difficulty with following activities due to following visual impairments: Driving, reading  PERCEPTION: WFL  PRAXIS: WFL  OBSERVATIONS: At times pt demonstrates some word finding difficulties.    TODAY'S TREATMENT:                                                                                                                              DATE:   03/15/23 -A/ROM: flexion, abduction, horizontal abduction, protraction, er/IR, x10 -Wrist ROM: flexion/extension, ulnar/radial deviation, supination/pronation, x10 -Shoulder Strengthening: 3lb dumbbell, flexion, abduction, protraction, horizontal abduction, er/IR, x10 -9 hole peg test -Measurements for Reassessment  03/13/23 -Shoulder Strengthening: 3lb dumbbell, flexion, abduction,  protraction, horizontal abduction, er/IR, x10 -Wrist Strengthening: 3lb dumbbell, flexion, extension, ulnar/radial deviation, supination/pronation, x10 -Red rubber band: abduction, finger taps, x10 -Weighted Ball: ABC's -Gripper: attempted 45lb and 42lbs, unable to pick up beads this session squeeze x10 each, 35lbs picking up 5 medium beads -Pinch Strengthening: green resistance clip, picking up large pegs and placing on peg board per pattern  03/08/23 -Scapular Strengthening: green theraband, extension, protraction, retraction, rows, x15 -Shoulder strengthening: green theraband, flexion, abduction, horizontal abduction, er, IR, x10 -Theraputty: red putty, push in 10 beads, roll into ball, pinch and pull to find 10 beads, roll into ball, squeeze x10  PATIENT EDUCATION: Education details: Review HEP Person educated: Patient and Spouse Education method: Explanation Education comprehension: verbalized understanding  HOME EXERCISE PROGRAM: 2/19: Weight bearing (reaching, weight shifting) 2/26: Coins and card tasks 2/28: Wrist Strengthening 3/4: Shoulder Strengthening 3/13: Theraputty    GOALS: Goals reviewed with patient? Yes  SHORT TERM GOALS: Target date: 03/17/23  Pt will be provided with and educated on HEP to improve mobility in LUE required for ADL completion.  Goal status: IN PROGRESS  2.  Pt will improve strength to 4+/5 in order to improve ability to perform lifting tasks during meal preparation and cleaning tasks.  Goal status: IN PROGRESS  3.  Pt will increase left grip strength by 10# and pinch strength by 3# to improve ability to grasp and hold pots and pans during simple meal preparation.  Goal status: IN PROGRESS  4.  Pt will increase left hand coordination by completing 9 hole peg test in under 35 seconds, improving ability to manipulate lids and tops on jars, bottles, etc.  Goal status: IN PROGRESS  5.  Pt will improve LUE motor planning by completing  functional reaching tasks with minimal compensatory assist from scapular and trapezius regions.  Goal status: MET  6. Pt will increase A/ROM of LUE by 40 degrees in all directions in order to reach overhead and behind back during dressing and bathing tasks.   Goal status: IN PROGRESS   ASSESSMENT:  CLINICAL IMPRESSION: Pt seen this session for reassessment. He is making great improvements in strength, ROM, coordination, and grip/pinch strengthening. This session he completed ROM and strengthening overall, as well as coordination task with the 9 hole peg test. He continues to have some deficits with motor planning and functional wrist movement. OT decreased pt's treatment frequency to 1 time per week for 4 more weeks to continue progressing his coordination and motor planning.    PERFORMANCE DEFICITS: in functional skills including ADLs, IADLs, coordination, sensation, ROM, strength, muscle spasms, Fine motor control, Gross motor control, body mechanics, vision, and UE functional use, cognitive skills including attention, memory, problem solving, and safety awareness.   PLAN:  OT FREQUENCY: 1x/week  OT DURATION: 4 weeks  PLANNED INTERVENTIONS: self care/ADL training, therapeutic exercise, therapeutic activity, neuromuscular re-education, passive range of motion, functional mobility training, electrical stimulation, ultrasound, paraffin, moist heat, cryotherapy, patient/family education, cognitive remediation/compensation, energy conservation, and DME and/or AE instructions  RECOMMENDED OTHER SERVICES: PT and Speech for Cognition  CONSULTED AND AGREED WITH PLAN OF CARE: Patient  PLAN FOR NEXT SESSION: A/ROM, Restart Weight bearing, Closed chain exercises, fine motor tasks, strengthening tasks.    Paulita Fujita, OTR/L [facility] [place], [place] 03/15/2023, 2:38 PM

## 2023-03-15 NOTE — Progress Notes (Unsigned)
GI Office Note    Referring Provider: [name], MD Primary Care Physician:  [name], MD  Primary Gastroenterologist: Elon Alas. Abbey Chatters, DO  Chief Complaint   No chief complaint on file.  History of Present Illness   Robert Lyons is a 62 y.o. male presenting today at the request of [name], MD for ***GERD and consideration of EGD for screening for Barrett's or esophageal cancer.  EGD August 2006: -Normal esophagus s/p dilation with 56 French Maloney dilator -Normal stomach -Normal duodenum -Small sliding hiatal hernia -Antireflux measures -Omeprazole 20 mg daily  Negative Cologuard 02/03/2023.  Today: GERD - Currently on pantoprazole 40 mg once daily.     Current Outpatient Medications  Medication Sig Dispense Refill   acetaminophen (TYLENOL) 500 MG tablet Take 1,000 mg by mouth every 6 (six) hours as needed.     amLODipine (NORVASC) 5 MG tablet Take 1 tablet (5 mg total) by mouth daily. 30 tablet 9   aspirin EC 81 MG tablet Take 1 tablet (81 mg total) by mouth daily with breakfast. Please take Aspirin 81 mg daily along with Plavix 75 mg daily for 21 days then after that STOP the Plavix  and continue ONLY Aspirin 81 mg daily indefinitely--for secondary stroke Prevention 30 tablet 11   atorvastatin (LIPITOR) 80 MG tablet Take 1 tablet (80 mg total) by mouth daily. 30 tablet 11   glucose blood (ACCU-CHEK GUIDE) test strip Use as instructed to check blood sugar 4 times daily as directed. DX: E11.65 200 each 12   Lancets (ACCU-CHEK MULTICLIX) lancets Use as instructed to test blood sugar 4 times daily. DX: E11.65 200 each 12   lisinopril (ZESTRIL) 40 MG tablet Take 1 tablet (40 mg total) by mouth daily. 90 tablet 3   meclizine (ANTIVERT) 25 MG tablet Take 1 tablet (25 mg total) by mouth 3 (three) times daily as needed for dizziness. 12 tablet 0   metFORMIN (GLUCOPHAGE) 850 MG tablet Take 1 tablet (850 mg total) by mouth 2 (two) times daily with a  meal. 180 tablet 4   metoprolol tartrate (LOPRESSOR) 50 MG tablet Take 1 tablet (50 mg total) by mouth 2 (two) times daily. 180 tablet 4   moxifloxacin (VIGAMOX) 0.5 % ophthalmic solution Apply to eye.     pantoprazole (PROTONIX) 40 MG tablet Take 1 tablet (40 mg total) by mouth daily. 30 tablet 11   prednisoLONE acetate (PRED FORTE) 1 % ophthalmic suspension      Semaglutide,0.25 or 0.5MG /DOS, (OZEMPIC, 0.25 OR 0.5 MG/DOSE,) 2 MG/3ML SOPN Inject 0.5 mg into the skin once a week. 3 mL 2   No current facility-administered medications for this visit.    Past Medical History:  Diagnosis Date   GERD (gastroesophageal reflux disease)    Hypertension    Pneumonia    Type 2 diabetes mellitus ([place])     Past Surgical History:  Procedure Laterality Date   No prior surgery      Family History  Problem Relation Age of Onset   Diabetes Mother    Hypertension Mother    Diabetes Maternal Aunt    Diabetes Maternal Uncle     Allergies as of 03/16/2023   (No Known Allergies)    Social History   Socioeconomic History   Marital status: Single    Spouse name: Not on file   Number of children: Not on file   Years of education: Not on file   Highest education level: Not  on file  Occupational History   Not on file  Tobacco Use   Smoking status: Former    Packs/day: 1.00    Years: 15.00    Additional pack years: 0.00    Total pack years: 15.00    Types: Cigarettes    Quit date: 01/17/2023    Years since quitting: 0.1   Smokeless tobacco: Never  Vaping Use   Vaping Use: Never used  Substance and Sexual Activity   Alcohol use: No    Comment: none since 2014   Drug use: No   Sexual activity: Not on file  Other Topics Concern   Not on file  Social History Narrative   Not on file   Social Determinants of Health   Financial Resource Strain: High Risk (02/02/2023)   Overall Financial Resource Strain (CARDIA)    Difficulty of Paying Living Expenses: Very hard  Food Insecurity: No  Food Insecurity (01/31/2023)   Hunger Vital Sign    Worried About Running Out of Food in the Last Year: Never true    Ran Out of Food in the Last Year: Never true  Transportation Needs: Unmet Transportation Needs (01/31/2023)   PRAPARE - Hydrologist (Medical): Yes    Lack of Transportation (Non-Medical): No  Physical Activity: Not on file  Stress: Not on file  Social Connections: Not on file  Intimate Partner Violence: Not At Risk (01/31/2023)   Humiliation, Afraid, Rape, and Kick questionnaire    Fear of Current or Ex-Partner: No    Emotionally Abused: No    Physically Abused: No    Sexually Abused: No     Review of Systems   Gen: Denies any fever, chills, fatigue, weight loss, lack of appetite.  CV: Denies chest pain, heart palpitations, peripheral edema, syncope.  Resp: Denies shortness of breath at rest or with exertion. Denies wheezing or cough.  GI: see HPI GU : Denies urinary burning, urinary frequency, urinary hesitancy MS: Denies joint pain, muscle weakness, cramps, or limitation of movement.  Derm: Denies rash, itching, dry skin Psych: Denies depression, anxiety, memory loss, and confusion Heme: Denies bruising, bleeding, and enlarged lymph nodes.   Physical Exam   There were no vitals taken for this visit.  General:   Alert and oriented. Pleasant and cooperative. Well-nourished and well-developed.  Head:  Normocephalic and atraumatic. Eyes:  Without icterus, sclera clear and conjunctiva pink.  Ears:  Normal auditory acuity. Mouth:  No deformity or lesions, oral mucosa pink.  Lungs:  Clear to auscultation bilaterally. No wheezes, rales, or rhonchi. No distress.  Heart:  S1, S2 present without murmurs appreciated.  Abdomen:  +BS, soft, non-tender and non-distended. No HSM noted. No guarding or rebound. No masses appreciated.  Rectal:  Deferred  Msk:  Symmetrical without gross deformities. Normal posture. Extremities:  Without  edema. Neurologic:  Alert and  oriented x4;  grossly normal neurologically. Skin:  Intact without significant lesions or rashes. Psych:  Alert and cooperative. Normal mood and affect.   Assessment   Robert Lyons is a 62 y.o. male with a history of GERD, hypertension, diabetes*** presenting today for further evaluation of GERD.  GERD:   PLAN   *** Proceed with upper endoscopy with propofol by Dr. Abbey Chatters in near future: the risks, benefits, and alternatives have been discussed with the patient in detail. The patient states understanding and desires to proceed.  ASA *** TIF?   Venetia Night, MSN, FNP-BC, AGACNP-BC [facility]

## 2023-03-15 NOTE — Therapy (Signed)
OUTPATIENT PHYSICAL THERAPY TREATMENT Patient Name: Robert Lyons MRN: [id number] DOB:October 03, 1961, 62 y.o., male Today's Date: 03/15/2023   PCP: [name] MD  REFERRING PROVIDER: Roxan Hockey, MD  END OF SESSION:   PT End of Session - 03/15/23 1523     Visit Number 8    Number of Visits 16    Date for PT Re-Evaluation 04/07/23    Authorization Type [place] Medicaid [employer]    Authorization Time Period 12 visits approved 2/16-4/16    Authorization - Visit Number 7    Authorization - Number of Visits 12    Progress Note Due on Visit 8    PT Start Time 1520    PT Stop Time 1600    PT Time Calculation (min) 40 min    Equipment Utilized During Treatment Gait belt    Activity Tolerance Patient tolerated treatment well    Behavior During Therapy WFL for tasks assessed/performed             Past Medical History:  Diagnosis Date   GERD (gastroesophageal reflux disease)    Hypertension    Pneumonia    Type 2 diabetes mellitus ([place])    Past Surgical History:  Procedure Laterality Date   No prior surgery     Patient Active Problem List   Diagnosis Date Noted   Paratracheal lymphadenopathy 01/31/2023   Acute CVA (cerebrovascular accident) ([place]) 01/18/2023   Abdominal pain 01/18/2023   Chest pain 01/18/2023   Tobacco abuse 01/18/2023   Hypokalemia 01/18/2023   Vitamin D deficiency 01/05/2023   GERD (gastroesophageal reflux disease) 12/08/2022   Cataracts, both eyes 12/08/2022   Encounter for general adult medical examination with abnormal findings 12/08/2022   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[place]) [id number]   Essential hypertension 07/10/2017   Hyperlipidemia 07/10/2017   Cigarette nicotine dependence without complication [id number]    ONSET DATE: 01/18/2023  REFERRING DIAG: I63.9 (ICD-10-CM) - Acute CVA (cerebrovascular accident) ([place])  THERAPY DIAG:  Difficulty walking  Weakness of left lower extremity  Rationale for  Evaluation and Treatment: Rehabilitation  SUBJECTIVE:                                                                                                                                                                                             SUBJECTIVE STATEMENT: Patient just finishing up OT.  States he is tired today but no pain or issues.    Evaluation: Patient states that everything on the L LE went to a "limp" since he had the stroke in January 2024. Also reports of numbness on the LEs. Patient was admitted to  the hospital when he had a stroke in 01/18/2023 and was D/C the other night. Denies getting rehabilitation services while at the hospital but he was immediately referred to outpatient PT evaluation and management. Patient is also receiving outpatient OT services at this time.  Pt accompanied by: significant other  PERTINENT HISTORY: DM, HTN  PAIN:  Are you having pain? Yes: NPRS scale: 2/10 Pain location: L LE Pain description: sharp, intermittent Aggravating factors: comes and goes randomly Relieving factors: comes and goes randomly  PRECAUTIONS: None  WEIGHT BEARING RESTRICTIONS: No  FALLS: Has patient fallen in last 6 months? No  LIVING ENVIRONMENT: Lives with:  significant other Lives in: House/apartment Stairs: No Has following equipment at home: Single point cane and shower chair  PLOF: Independent  PATIENT GOALS: "to get my strength back"  OBJECTIVE:   DIAGNOSTIC FINDINGS:  MRI Brain without contrast 01/18/2023 IMPRESSION: 1. 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 2. No intracranial large vessel occlusion or significant stenosis.  COGNITION: Overall cognitive status: Within functional limits for tasks assessed   SENSATION: Patient reports that the L LE has can feel "more" than the R as to light touch  COORDINATION: Moderately impaired on the L LE as to heel-to-shin No difficulty with alternating seated foot taps  MUSCLE LENGTH: Moderate  tightness on B hamstrings and gastrocnemius  POSTURE: rounded shoulders, forward head, and L knee slightly flexed in standing  LOWER EXTREMITY ROM:     Active  Right Eval Left Eval  Hip flexion [facility]  Hip extension [facility]  Hip abduction [facility]  Hip adduction    Hip internal rotation    Hip external rotation    Knee flexion [facility] WFL  Knee extension [facility]  Ankle dorsiflexion [facility] WFL  Ankle plantarflexion [facility] WFL  Ankle inversion    Ankle eversion     (Blank rows = not tested)  LOWER EXTREMITY MMT:    MMT Right Eval Left Eval  Hip flexion 5 3+  Hip extension 4 3+  Hip abduction 4+ 3+  Hip adduction    Hip internal rotation    Hip external rotation    Knee flexion 5 4-  Knee extension 4 3+  Ankle dorsiflexion 5 3+  Ankle plantarflexion 5 3+  Ankle inversion    Ankle eversion    (Blank rows = not tested)  BED MOBILITY:  Sit to supine Complete Independence Supine to sit Complete Independence Rolling to Right Complete Independence Rolling to Left Complete Independence  TRANSFERS: Assistive device utilized: None  Sit to stand: SBA Stand to sit: SBA  GAIT: Gait pattern: decreased step length- Right, decreased stance time- Left, decreased stride length, decreased hip/knee flexion- Left, decreased ankle dorsiflexion- Left, Left hip hike, knee flexed in stance- Left, lateral lean- Right, narrow BOS, poor foot clearance- Right, and poor foot clearance- Left.  Distance walked: 207 ft Assistive device utilized: Single point cane Level of assistance: CGA Comments: moderately unsteady  FUNCTIONAL TESTS:  5 times sit to stand: 21.53 sec Timed up and go (TUG): 22.83 sec 2 minute walk test: 207 ft  TODAY'S TREATMENT:  DATE:  03/15/2023 Nustep level 3, 5 minutes, LE only, seat 10, 80-90 SPM Standing: heel and toe raises 15X (difficulty with  toe raise) Gastrocnemius slant board stretch x 30" x 3 Lunges onto 4" no UE 2X10 each 6" lateral step ups 2X10 each 1 UE 6" forward step ups 2X10 each 1 UE SLS 15" X 3 each LE Tandem stance 30" X 2 each LE Vectors 5X5" each LE with 1 UE assist Stairs 7" 5RT with 1 HR reciprocally Seated: Sit-to-stand on a standard chair, LE's equal no UE assist  03/13/2023 Nustep level 3, 5 minutes, LE only, seat 10, 80-90 SPM Standing: Gastrocnemius slant board stretch x 30" x 3 Lunges onto 4" no UE 2X10 each 4" lateral step ups 2X10 each 1 UE 4" forward step ups 2X10 each 1 UE Seated: Sit-to-stand on a standard chair, LLE slightly behind, with UE assist x 5  03/08/2023 Nustep level 3, 5 minutes, LE only, seat 10, 80-90 SPM Gastrocnemius slant board stretch x 30" x 3 Seated hamstring stretch x 30" x 3 Sit-to-stand on a standard chair, LLE slightly behind, with UE assist x 5 Sit-to-stand slightly elevated seat, B LE slightly behind x 5, no UE assist Standing L TKE, RTB x 3" x 10 x 2 Walking backwards x 40 ft x 2 rounds, CGA Forward stepping over cone obstacles ~ 5 ft x 2 rounds, CGA Side stepping over cone obstacles ~ 5 ft x 2 rounds, CGA   03/06/2023 Nustep level 2 5 minutes UE/LE seat 10  Standing: Heel raises x 20 Marching 3# x 20 Hip abduction 3# x 20 Hip extension 3# x 20 4" box step ups x 10 each 4" box lateral step ups x 10 Tandem stance 2 x 30"  Sit to stand x 10 no UE assist  02/27/2023 Nustep level 2 5 minutes UE/LE seat 10 Standing:  heelraise 20X  Hip abduction 20X each 2#  Hip extension 20X each 2#  Marching alternating with 5" holds 10X each 2#  4" step up laterals 20X each  4" lunge with 1 UE assist 20X each  Tandem stance 30" each lead  Vector 10X5" with 1 UE assist each LE Sit to stands 10X no UE from standard chair  02/22/2023 Nustep level 2 5 minutes UE/LE seat 10 Standing:  heelraise 20X  Hip abduction 20X each  Hip extension 20X each  Marching  alternating with 5" holds 10X each  4" step up laterals 20X each  4" lunge with 1 UE assist 10X each  Tandem stance 30" each lead  Vector 10X5" with 1 UE assist  Squats 10X Sit to stands 10X no UE from standard chair Long arc quad 20X5" holds each LE  02/13/2023 Nustep level 2 5 minutes UE/LE seat 10 Standing:  heelraise 20X  Toeraise 20X  Hip abduction 20X each  Hip extension 20X each  Marching alternating with 5" holds 10X each  4" step up laterals 20X each Sit to stands 10X no UE from standard chair Long arc quad 20X5" holds each LE  02/10/23 Evaluation and patient education    PATIENT EDUCATION: Education details: Educated on the pathoanatomy of CVA. Educated on the goals and course of rehab. Education on falls reduction/prevention at home. Person educated: Patient and significant other Education method: Explanation Education comprehension: verbalized understanding  HOME EXERCISE PROGRAM: Access Code: [email or web address] URL: [email or web address] Date: 02/13/2023 Prepared by: Roseanne Reno Exercises - Seated Long Arc Quad  - 2 x daily - 7 x weekly -  1 sets - 20 reps - 5 sec hold - Sit to Stand  - 2 x daily - 7 x weekly - 1 sets - 10 reps - Standing Hip Abduction  - 2 x daily - 7 x weekly - 1 sets - 20 reps - Standing Hip Extension  - 2 x daily - 7 x weekly - 1 sets - 20 reps - Standing Hip Flexion  - 2 x daily - 7 x weekly - 1 sets - 20 reps - 5 sec hold   GOALS: Goals reviewed with patient? Yes  SHORT TERM GOALS: Target date: 03/11/2023  Pt will demonstrate indep in HEP to facilitate carry-over of skilled services and improve functional outcomes  Goal status: IN PROGRESS  LONG TERM GOALS: Target date: 04/07/2023  Pt will have a decrease in TUG score by at least 3 sec in order to demonstrate clinically significant improvement in community ambulation Baseline: 22.83 sec Goal status: IN PROGRESS  2.  Pt will decrease 5TSTS by at least 3 seconds in order to  demonstrate clinically significant improvement in LE strength  Baseline: 21.53 sec Goal status: IN PROGRESS  3.  Pt will increase 2MWT by at least 40 ft in order to demonstrate clinically significant improvement in community ambulation  Baseline: 207 ft Goal status: IN PROGRESS  4.  Pt will demonstrate increase in LE strength to 4+/5 to facilitate ease and safety in ambulation  Baseline: 3+/5 Goal status: IN PROGRESS  5.  Pt will be able to walk on level surfaces > 100 ft without an assistive device with little to no gait deviation Goal status: IN PROGRESS  ASSESSMENT:  CLINICAL IMPRESSION: Continued with focus on LE strengthening, flexibility and balance. Added vectors to help improve LE stability to improve balance. Pt with max cues to maintain upright posturing and increase hold times with this activity.  Cues needed today to improve motivation, lift LE's higher and more complete when negotiating stairs.  Ability to complete 7" step height reciprocally with 1 HR with noted "weak Legs" due to being fatigued. Finished up on nustep focusing on LE's only. Pt will continue to benefit from skilled PT to address impairments and improve function.    OBJECTIVE IMPAIRMENTS: Abnormal gait, decreased balance, decreased endurance, decreased mobility, difficulty walking, decreased strength, and impaired flexibility.   ACTIVITY LIMITATIONS: lifting, bending, sitting, standing, squatting, stairs, transfers, and locomotion level  PARTICIPATION LIMITATIONS: meal prep, cleaning, laundry, driving, shopping, and community activity  PERSONAL FACTORS: Fitness are also affecting patient's functional outcome.   REHAB POTENTIAL: Good  CLINICAL DECISION MAKING: Stable/uncomplicated  EVALUATION COMPLEXITY: Low  PLAN:  PT FREQUENCY: 2x/week  PT DURATION: 8 weeks  PLANNED INTERVENTIONS: Therapeutic exercises, Therapeutic activity, Neuromuscular re-education, Balance training, Gait training,  Patient/Family education, Self Care, Stair training, and Electrical stimulation  PLAN FOR NEXT SESSION:  Progress strengthening, balance and gait training.  3:24 PM, 03/15/23 Teena Irani, PTA/CLT [place] Ph: [phone]

## 2023-03-16 ENCOUNTER — Inpatient Hospital Stay: Payer: Medicaid Other | Admitting: Licensed Clinical Social Worker

## 2023-03-16 ENCOUNTER — Ambulatory Visit: Payer: Medicaid Other | Admitting: Gastroenterology

## 2023-03-16 ENCOUNTER — Ambulatory Visit (INDEPENDENT_AMBULATORY_CARE_PROVIDER_SITE_OTHER): Payer: Medicaid Other | Admitting: Gastroenterology

## 2023-03-16 ENCOUNTER — Encounter: Payer: Self-pay | Admitting: Gastroenterology

## 2023-03-16 VITALS — BP 122/69 | HR 89 | Temp 97.7°F | Ht 71.0 in | Wt 193.6 lb

## 2023-03-16 DIAGNOSIS — K5900 Constipation, unspecified: Secondary | ICD-10-CM

## 2023-03-16 DIAGNOSIS — R590 Localized enlarged lymph nodes: Secondary | ICD-10-CM

## 2023-03-16 DIAGNOSIS — K219 Gastro-esophageal reflux disease without esophagitis: Secondary | ICD-10-CM

## 2023-03-16 DIAGNOSIS — Z7689 Persons encountering health services in other specified circumstances: Secondary | ICD-10-CM | POA: Diagnosis not present

## 2023-03-16 DIAGNOSIS — R1310 Dysphagia, unspecified: Secondary | ICD-10-CM | POA: Diagnosis not present

## 2023-03-16 NOTE — Patient Instructions (Addendum)
We are scheduling you for a upper endoscopy with possible dilation with Dr. Abbey Chatters in the near future.  You received separate detailed written instructions regarding your prep and medications that you need to hold.  Hold Ozempic for 1 week prior to procedure Hold metformin night prior to and morning of procedure.    For your constipation: Start taking MiraLAX 17 g once daily mixed in 8 ounces of liquid of your choice. Start taking daily stool softener such as Colace 100 mg once daily  Continue omeprazole 40 mg once daily.  Avoid typical foods that are hard to swallow including large pieces of bread.  Take small bites and alternate with sips of liquids.  If you begin to have trouble with medications please take 1 medication at a time.  We will plan to follow-up in 2 months, sooner if needed.  It was a pleasure to see you today. I want to create trusting relationships with patients. If you receive a survey regarding your visit,  I greatly appreciate you taking time to fill this out on paper or through your MyChart. I value your feedback.  Venetia Night, MSN, FNP-BC, AGACNP-BC [facility]

## 2023-03-16 NOTE — Progress Notes (Signed)
[place] CSW Progress Note  Holiday representative  received a call from [name] inquiring about services from [employer].  CSW informed [name] that pt does not have a cancer diagnosis at this time.  Pt is scheduled for scans in May.  If pt is diagnosed with cancer supportive services can be started with [name].  Per [name] pt has completed paperwork for SSI, but is still waiting for a determination.  CSW to remain available as appropriate.        Robert Combs, Robert Lyons    Patient is participating in a Managed Medicaid Plan:  Yes

## 2023-03-17 ENCOUNTER — Telehealth (INDEPENDENT_AMBULATORY_CARE_PROVIDER_SITE_OTHER): Payer: Self-pay | Admitting: *Deleted

## 2023-03-17 ENCOUNTER — Encounter: Payer: Self-pay | Admitting: *Deleted

## 2023-03-17 NOTE — Telephone Encounter (Signed)
Instructions placed up front with cancellation letter to be signed.

## 2023-03-17 NOTE — Telephone Encounter (Signed)
Spoke with pt. He has been scheduled for EGD +/-ed with Dr. Abbey Chatters, ASA 3. He is aware will need pre-op appt prior. He stated he takes his ozempic on mon/tues. I advised him he needs to hold this for 7 days prior. He voiced understanding. He will stop by office to pick up instructions at [facility]

## 2023-03-20 ENCOUNTER — Ambulatory Visit (HOSPITAL_COMMUNITY): Payer: Medicaid Other | Admitting: Occupational Therapy

## 2023-03-20 ENCOUNTER — Ambulatory Visit (HOSPITAL_COMMUNITY): Payer: Medicaid Other | Admitting: Physical Therapy

## 2023-03-20 ENCOUNTER — Encounter (HOSPITAL_COMMUNITY): Payer: Self-pay | Admitting: Occupational Therapy

## 2023-03-20 DIAGNOSIS — R29818 Other symptoms and signs involving the nervous system: Secondary | ICD-10-CM

## 2023-03-20 DIAGNOSIS — R278 Other lack of coordination: Secondary | ICD-10-CM | POA: Diagnosis not present

## 2023-03-20 DIAGNOSIS — Z7689 Persons encountering health services in other specified circumstances: Secondary | ICD-10-CM | POA: Diagnosis not present

## 2023-03-20 DIAGNOSIS — I69354 Hemiplegia and hemiparesis following cerebral infarction affecting left non-dominant side: Secondary | ICD-10-CM | POA: Diagnosis not present

## 2023-03-20 DIAGNOSIS — R262 Difficulty in walking, not elsewhere classified: Secondary | ICD-10-CM | POA: Diagnosis not present

## 2023-03-20 DIAGNOSIS — R29898 Other symptoms and signs involving the musculoskeletal system: Secondary | ICD-10-CM

## 2023-03-20 NOTE — Therapy (Signed)
OUTPATIENT OCCUPATIONAL THERAPY NEURO TREATMENT NOTE    Patient Name: Robert Lyons MRN: [id number] DOB:1961-02-16, 62 y.o., male Today's Date: 03/20/2023  PCP: [name], MD REFERRING PROVIDER: Roxan Hockey, MD   END OF SESSION:  OT End of Session - 03/20/23 1436     Visit Number 10    Number of Visits 13    Date for OT Re-Evaluation 04/14/23    Authorization Type Managed medicaid    Authorization Time Period 8 visits (02/10/23-04/01/23)    Authorization - Visit Number 1    Authorization - Number of Visits 4    OT Start Time 1435    OT Stop Time 1515    OT Time Calculation (min) 40 min    Activity Tolerance Patient tolerated treatment well    Behavior During Therapy WFL for tasks assessed/performed            Past Medical History:  Diagnosis Date   GERD (gastroesophageal reflux disease)    Hypertension    Pneumonia    Type 2 diabetes mellitus ([place])    Past Surgical History:  Procedure Laterality Date   No prior surgery     Patient Active Problem List   Diagnosis Date Noted   Paratracheal lymphadenopathy 01/31/2023   Acute CVA (cerebrovascular accident) ([place]) 01/18/2023   Abdominal pain 01/18/2023   Chest pain 01/18/2023   Tobacco abuse 01/18/2023   Hypokalemia 01/18/2023   Vitamin D deficiency 01/05/2023   GERD (gastroesophageal reflux disease) 12/08/2022   Cataracts, both eyes 12/08/2022   Encounter for general adult medical examination with abnormal findings 12/08/2022   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[place]) [id number]   Essential hypertension 07/10/2017   Hyperlipidemia 07/10/2017   Cigarette nicotine dependence without complication [id number]    ONSET DATE: 01/18/23  REFERRING DIAG: R [place] CVA  THERAPY DIAG:  Other lack of coordination  Hemiplegia and hemiparesis following cerebral infarction affecting left non-dominant side ([place])  Other symptoms and signs involving the nervous system  Rationale for  Evaluation and Treatment: Rehabilitation  SUBJECTIVE:   SUBJECTIVE STATEMENT: "These shoulder exercises hurt" Pt accompanied by: significant other  PERTINENT HISTORY: Patient states that everything on the L LE went to a "limp" since he had the stroke in January 2024. Also reports of numbness on the LEs. Patient was admitted to the hospital when he had a stroke on 01/18/2023.  PRECAUTIONS: None  WEIGHT BEARING RESTRICTIONS: No  PAIN:  Are you having pain? No  FALLS: Has patient fallen in last 6 months? No  PATIENT GOALS: To get strength back in my L side.   OBJECTIVE:   HAND DOMINANCE: Right  ADLs: Overall ADLs: Pt reports difficulty getting his clothes on, states he gets mixed up with it at times. Additionally due to strength and endurance he has a difficult time cooking and cleaning. Pt's partner assists with medication management.   MOBILITY STATUS: Independent  POSTURE COMMENTS:  No Significant postural limitations Sitting balance: Moves/returns truncal midpoint >2 inches in all planes  ACTIVITY TOLERANCE: Activity tolerance: Pt reports that his endurance has been affected, where he has difficulty sustaining activities due to getting out of breath and weak.   FUNCTIONAL OUTCOME MEASURES: Quick Dash: 45.45 03/15/23: 36.36  UPPER EXTREMITY ROM:    Active ROM Left eval Left 03/15/23  Shoulder flexion 132 122  Shoulder abduction 87 100  Shoulder internal rotation 90 90  Shoulder external rotation 45 72  Elbow flexion 69 139  Elbow extension -3 -3  Wrist flexion 30 75  Wrist extension -2 12  Wrist ulnar deviation 28 49  Wrist radial deviation -5 2  Wrist pronation [facility]  Wrist supination 45 80  (Blank rows = not tested)  UPPER EXTREMITY MMT:     MMT Left eval Left 03/15/23  Shoulder flexion 4/5 4+/5  Shoulder abduction 4-/5 4+/5  Shoulder adduction 4/5 4+/5  Shoulder extension 4+/5 5/5  Shoulder internal rotation 4-/5 4+/5  Shoulder external rotation  4-/5 4+/5  Elbow flexion 4/5 5/5  Elbow extension 4+/5 5/5  Wrist flexion 4/5 4+/5  Wrist extension 3+/5 4/5  Wrist ulnar deviation 4/5 4+/5  Wrist radial deviation 3/5 4-/5  Wrist pronation 5/5 5/5  Wrist supination 4+/5 5/5  (Blank rows = not tested)  HAND FUNCTION: Grip strength: Right: 70 lbs; Left: 20 lbs, Lateral pinch: Right: 20 lbs, Left: 3 lbs, and 3 point pinch: Right: 10 lbs, Left: 3 lbs Grip strength: 03/15/23; Left: 26 lbs, Lateral pinch: Left: 12 lbs, and 3 point pinch: Left: 6 lbs  COORDINATION: 9 Hole Peg test: Right: 33.74 sec; Left: 46.55 sec 9 Hole Peg test: 03/15/23; Left: 46.09 sec  SENSATION: Light touch: Impaired   EDEMA: Mild edema noted in the L hand  COGNITION: Overall cognitive status: Within functional limits for tasks assessed  VISION: Subjective report: Pt reports he has severe cataracts in both eyes. Was supposed to have surgery, but it got postponed.  Baseline vision: Wears glasses all the time Visual history: cataracts  VISION ASSESSMENT: Not tested  Patient has difficulty with following activities due to following visual impairments: Driving, reading  PERCEPTION: WFL  PRAXIS: WFL  OBSERVATIONS: At times pt demonstrates some word finding difficulties.    TODAY'S TREATMENT:                                                                                                                              DATE:   03/20/23 -Pinch Strengthening: red, green, blue, black resistance clips, tripod pinch up, lateral pinch down x5 each -Shoulder Strengthening: 3lb dumbbell, flexion, abduction, protraction, horizontal abduction, er/IR, x10 -Nuts and Bolts: 2 large bolts, 2 medium, 2 small, and 2 tiny bolts, unscrewing and screwing back -sponges: pinch and hold 23 sponges -Pinch and stack 6 cubes using green resistance clips  03/15/23 -A/ROM: flexion, abduction, horizontal abduction, protraction, er/IR, x10 -Wrist ROM: flexion/extension, ulnar/radial  deviation, supination/pronation, x10 -Shoulder Strengthening: 3lb dumbbell, flexion, abduction, protraction, horizontal abduction, er/IR, x10 -9 hole peg test -Measurements for Reassessment  03/13/23 -Shoulder Strengthening: 3lb dumbbell, flexion, abduction, protraction, horizontal abduction, er/IR, x10 -Wrist Strengthening: 3lb dumbbell, flexion, extension, ulnar/radial deviation, supination/pronation, x10 -Red rubber band: abduction, finger taps, x10 -Weighted Ball: ABC's -Gripper: attempted 45lb and 42lbs, unable to pick up beads this session squeeze x10 each, 35lbs picking up 5 medium beads -Pinch Strengthening: green resistance clip, picking up large pegs and placing on peg board per pattern   PATIENT EDUCATION: Education details: Review HEP  Person educated: Patient and Spouse Education method: Explanation Education comprehension: verbalized understanding  HOME EXERCISE PROGRAM: 2/19: Weight bearing (reaching, weight shifting) 2/26: Coins and card tasks 2/28: Wrist Strengthening 3/4: Shoulder Strengthening 3/13: Theraputty    GOALS: Goals reviewed with patient? Yes  SHORT TERM GOALS: Target date: 03/17/23  Pt will be provided with and educated on HEP to improve mobility in LUE required for ADL completion.  Goal status: IN PROGRESS  2.  Pt will improve strength to 4+/5 in order to improve ability to perform lifting tasks during meal preparation and cleaning tasks.  Goal status: IN PROGRESS  3.  Pt will increase left grip strength by 10# and pinch strength by 3# to improve ability to grasp and hold pots and pans during simple meal preparation.  Goal status: IN PROGRESS  4.  Pt will increase left hand coordination by completing 9 hole peg test in under 35 seconds, improving ability to manipulate lids and tops on jars, bottles, etc.  Goal status: IN PROGRESS  5.  Pt will improve LUE motor planning by completing functional reaching tasks with minimal compensatory assist  from scapular and trapezius regions.  Goal status: MET  6. Pt will increase A/ROM of LUE by 40 degrees in all directions in order to reach overhead and behind back during dressing and bathing tasks.   Goal status: IN PROGRESS   ASSESSMENT:  CLINICAL IMPRESSION: This session pt continued to work on strengthening and motor control. He reports pain in his shoulder during strengthening along his bicep, however he was able to continue with exercises demonstrating good movement pattern. Pt continued session with pinch strengthening and motor planning tasks, where he as some difficulty with stacking and achieving smooth, small movements, especially with screwing the nuts back on the bolts. OT providing verbal and tactile cuing for positioning and technique throughout session.   PERFORMANCE DEFICITS: in functional skills including ADLs, IADLs, coordination, sensation, ROM, strength, muscle spasms, Fine motor control, Gross motor control, body mechanics, vision, and UE functional use, cognitive skills including attention, memory, problem solving, and safety awareness.   PLAN:  OT FREQUENCY: 1x/week  OT DURATION: 4 weeks  PLANNED INTERVENTIONS: self care/ADL training, therapeutic exercise, therapeutic activity, neuromuscular re-education, passive range of motion, functional mobility training, electrical stimulation, ultrasound, paraffin, moist heat, cryotherapy, patient/family education, cognitive remediation/compensation, energy conservation, and DME and/or AE instructions  RECOMMENDED OTHER SERVICES: PT and Speech for Cognition  CONSULTED AND AGREED WITH PLAN OF CARE: Patient  PLAN FOR NEXT SESSION: A/ROM, Restart Weight bearing, Closed chain exercises, fine motor tasks, strengthening tasks.    Paulita Fujita, OTR/L [facility] [place], [place] 03/20/2023, 2:39 PM

## 2023-03-20 NOTE — Therapy (Signed)
OUTPATIENT PHYSICAL THERAPY TREATMENT Patient Name: Robert Lyons MRN: [id number] DOB:[birth year], 62 y.o., male Today's Date: 03/20/2023   PCP: [name] MD  REFERRING PROVIDER: Roxan Hockey, MD  END OF SESSION:   PT End of Session - 03/20/23 1341     Visit Number 9    Number of Visits 16    Date for PT Re-Evaluation 04/07/23    Authorization Type [place] Medicaid [employer]    Authorization Time Period 12 visits approved 2/16-4/16    Authorization - Visit Number 8    Authorization - Number of Visits 12    Progress Note Due on Visit 8    PT Start Time 1645    PT Stop Time 1730    PT Time Calculation (min) 45 min    Equipment Utilized During Treatment Gait belt    Activity Tolerance Patient tolerated treatment well    Behavior During Therapy WFL for tasks assessed/performed             Past Medical History:  Diagnosis Date   GERD (gastroesophageal reflux disease)    Hypertension    Pneumonia    Type 2 diabetes mellitus ([place])    Past Surgical History:  Procedure Laterality Date   No prior surgery     Patient Active Problem List   Diagnosis Date Noted   Paratracheal lymphadenopathy 01/31/2023   Acute CVA (cerebrovascular accident) ([place]) 01/18/2023   Abdominal pain 01/18/2023   Chest pain 01/18/2023   Tobacco abuse 01/18/2023   Hypokalemia 01/18/2023   Vitamin D deficiency 01/05/2023   GERD (gastroesophageal reflux disease) 12/08/2022   Cataracts, both eyes 12/08/2022   Encounter for general adult medical examination with abnormal findings 12/08/2022   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[place]) [id number]   Essential hypertension 07/10/2017   Hyperlipidemia 07/10/2017   Cigarette nicotine dependence without complication [id number]    ONSET DATE: 01/18/2023  REFERRING DIAG: I63.9 (ICD-10-CM) - Acute CVA (cerebrovascular accident) ([place])  THERAPY DIAG:  Difficulty walking  Weakness of left lower extremity  Rationale for  Evaluation and Treatment: Rehabilitation  SUBJECTIVE:                                                                                                                                                                                             SUBJECTIVE STATEMENT: Pt reports he watched sports, walked around the parking lot over the weekend. Admits to not doing his exercises.  States his stomach has been bothering him.    Evaluation: Patient states that everything on the L LE went to a "limp" since he had the stroke in January 2024.  Also reports of numbness on the LEs. Patient was admitted to the hospital when he had a stroke in 01/18/2023 and was D/C the other night. Denies getting rehabilitation services while at the hospital but he was immediately referred to outpatient PT evaluation and management. Patient is also receiving outpatient OT services at this time.  Pt accompanied by: significant other  PERTINENT HISTORY: DM, HTN  PAIN:  Are you having pain? Yes: NPRS scale: 2/10 Pain location: L LE Pain description: sharp, intermittent Aggravating factors: comes and goes randomly Relieving factors: comes and goes randomly  PRECAUTIONS: None  WEIGHT BEARING RESTRICTIONS: No  FALLS: Has patient fallen in last 6 months? No  LIVING ENVIRONMENT: Lives with:  significant other Lives in: House/apartment Stairs: No Has following equipment at home: Single point cane and shower chair  PLOF: Independent  PATIENT GOALS: "to get my strength back"  OBJECTIVE:   DIAGNOSTIC FINDINGS:  MRI Brain without contrast 01/18/2023 IMPRESSION: 1. 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 2. No intracranial large vessel occlusion or significant stenosis.  COGNITION: Overall cognitive status: Within functional limits for tasks assessed   SENSATION: Patient reports that the L LE has can feel "more" than the R as to light touch  COORDINATION: Moderately impaired on the L LE as to  heel-to-shin No difficulty with alternating seated foot taps  MUSCLE LENGTH: Moderate tightness on B hamstrings and gastrocnemius  POSTURE: rounded shoulders, forward head, and L knee slightly flexed in standing  LOWER EXTREMITY ROM:     Active  Right Eval Left Eval  Hip flexion [facility]  Hip extension [facility]  Hip abduction [facility]  Hip adduction    Hip internal rotation    Hip external rotation    Knee flexion [facility] WFL  Knee extension [facility]  Ankle dorsiflexion [facility] WFL  Ankle plantarflexion [facility] WFL  Ankle inversion    Ankle eversion     (Blank rows = not tested)  LOWER EXTREMITY MMT:    MMT Right Eval Left Eval  Hip flexion 5 3+  Hip extension 4 3+  Hip abduction 4+ 3+  Hip adduction    Hip internal rotation    Hip external rotation    Knee flexion 5 4-  Knee extension 4 3+  Ankle dorsiflexion 5 3+  Ankle plantarflexion 5 3+  Ankle inversion    Ankle eversion    (Blank rows = not tested)  BED MOBILITY:  Sit to supine Complete Independence Supine to sit Complete Independence Rolling to Right Complete Independence Rolling to Left Complete Independence  TRANSFERS: Assistive device utilized: None  Sit to stand: SBA Stand to sit: SBA  GAIT: Gait pattern: decreased step length- Right, decreased stance time- Left, decreased stride length, decreased hip/knee flexion- Left, decreased ankle dorsiflexion- Left, Left hip hike, knee flexed in stance- Left, lateral lean- Right, narrow BOS, poor foot clearance- Right, and poor foot clearance- Left.  Distance walked: 207 ft Assistive device utilized: Single point cane Level of assistance: CGA Comments: moderately unsteady  FUNCTIONAL TESTS:  5 times sit to stand: 21.53 sec Timed up and go (TUG): 22.83 sec 2 minute walk test: 207 ft  TODAY'S TREATMENT:  DATE:  03/20/2023 Nustep level 3, 5  minutes, LE only, seat 10, 80-90 SPM at EOS Standing: heel and toe raises 15X Gastrocnemius slant board stretch x 30" x 3 Lunges onto 4" no UE 2X10 each 6" lateral step ups 2X10 each 1 UE 6" forward step ups 2X10 each 1 UE SLS 15" X 3 each LE Tandem stance 30" X 2 each LE Vectors 10X5" each LE with 1 UE assist Stairs 7" 5RT with 1 HR reciprocally Seated: Sit-to-stand on a standard chair, LE's equal no UE assist  03/15/2023 Nustep level 3, 5 minutes, LE only, seat 10, 80-90 SPM Standing: heel and toe raises 15X (difficulty with toe raise) Gastrocnemius slant board stretch x 30" x 3 Lunges onto 4" no UE 2X10 each 6" lateral step ups 2X10 each 1 UE 6" forward step ups 2X10 each 1 UE SLS 15" X 3 each LE Tandem stance 30" X 2 each LE Vectors 5X5" each LE with 1 UE assist Stairs 7" 5RT with 1 HR reciprocally Seated: Sit-to-stand on a standard chair, LE's equal no UE assist  03/13/2023 Nustep level 3, 5 minutes, LE only, seat 10, 80-90 SPM Standing: Gastrocnemius slant board stretch x 30" x 3 Lunges onto 4" no UE 2X10 each 4" lateral step ups 2X10 each 1 UE 4" forward step ups 2X10 each 1 UE Seated: Sit-to-stand on a standard chair, LLE slightly behind, with UE assist x 5  03/08/2023 Nustep level 3, 5 minutes, LE only, seat 10, 80-90 SPM Gastrocnemius slant board stretch x 30" x 3 Seated hamstring stretch x 30" x 3 Sit-to-stand on a standard chair, LLE slightly behind, with UE assist x 5 Sit-to-stand slightly elevated seat, B LE slightly behind x 5, no UE assist Standing L TKE, RTB x 3" x 10 x 2 Walking backwards x 40 ft x 2 rounds, CGA Forward stepping over cone obstacles ~ 5 ft x 2 rounds, CGA Side stepping over cone obstacles ~ 5 ft x 2 rounds, CGA   03/06/2023 Nustep level 2 5 minutes UE/LE seat 10  Standing: Heel raises x 20 Marching 3# x 20 Hip abduction 3# x 20 Hip extension 3# x 20 4" box step ups x 10 each 4" box lateral step ups x 10 Tandem stance 2 x  30"  Sit to stand x 10 no UE assist  02/27/2023 Nustep level 2 5 minutes UE/LE seat 10 Standing:  heelraise 20X  Hip abduction 20X each 2#  Hip extension 20X each 2#  Marching alternating with 5" holds 10X each 2#  4" step up laterals 20X each  4" lunge with 1 UE assist 20X each  Tandem stance 30" each lead  Vector 10X5" with 1 UE assist each LE Sit to stands 10X no UE from standard chair    PATIENT EDUCATION: Education details: Educated on the pathoanatomy of CVA. Educated on the goals and course of rehab. Education on falls reduction/prevention at home. Person educated: Patient and significant other Education method: Explanation Education comprehension: verbalized understanding  HOME EXERCISE PROGRAM: Access Code: [email or web address] URL: [email or web address] Date: 02/13/2023 Prepared by: Roseanne Reno Exercises - Seated Long Arc Quad  - 2 x daily - 7 x weekly - 1 sets - 20 reps - 5 sec hold - Sit to Stand  - 2 x daily - 7 x weekly - 1 sets - 10 reps - Standing Hip Abduction  - 2 x daily - 7 x weekly - 1 sets - 20 reps - Standing Hip Extension  -  2 x daily - 7 x weekly - 1 sets - 20 reps - Standing Hip Flexion  - 2 x daily - 7 x weekly - 1 sets - 20 reps - 5 sec hold   GOALS: Goals reviewed with patient? Yes  SHORT TERM GOALS: Target date: 03/11/2023  Pt will demonstrate indep in HEP to facilitate carry-over of skilled services and improve functional outcomes  Goal status: IN PROGRESS  LONG TERM GOALS: Target date: 04/07/2023  Pt will have a decrease in TUG score by at least 3 sec in order to demonstrate clinically significant improvement in community ambulation Baseline: 22.83 sec Goal status: IN PROGRESS  2.  Pt will decrease 5TSTS by at least 3 seconds in order to demonstrate clinically significant improvement in LE strength  Baseline: 21.53 sec Goal status: IN PROGRESS  3.  Pt will increase 2MWT by at least 40 ft in order to demonstrate clinically  significant improvement in community ambulation  Baseline: 207 ft Goal status: IN PROGRESS  4.  Pt will demonstrate increase in LE strength to 4+/5 to facilitate ease and safety in ambulation  Baseline: 3+/5 Goal status: IN PROGRESS  5.  Pt will be able to walk on level surfaces > 100 ft without an assistive device with little to no gait deviation Goal status: IN PROGRESS  ASSESSMENT:  CLINICAL IMPRESSION: Focus of session today on LE strengthening, flexibility and balance. Static balance continues to be most challenging for patient.  Cues needed today to improve posturing and hold times with exercises.  Pt with improved height and control with stair negotiation. Finished up on nustep focusing on LE's only. Pt will continue to benefit from skilled PT to address impairments and improve function.   OBJECTIVE IMPAIRMENTS: Abnormal gait, decreased balance, decreased endurance, decreased mobility, difficulty walking, decreased strength, and impaired flexibility.   ACTIVITY LIMITATIONS: lifting, bending, sitting, standing, squatting, stairs, transfers, and locomotion level  PARTICIPATION LIMITATIONS: meal prep, cleaning, laundry, driving, shopping, and community activity  PERSONAL FACTORS: Fitness are also affecting patient's functional outcome.   REHAB POTENTIAL: Good  CLINICAL DECISION MAKING: Stable/uncomplicated  EVALUATION COMPLEXITY: Low  PLAN:  PT FREQUENCY: 2x/week  PT DURATION: 8 weeks  PLANNED INTERVENTIONS: Therapeutic exercises, Therapeutic activity, Neuromuscular re-education, Balance training, Gait training, Patient/Family education, Self Care, Stair training, and Electrical stimulation  PLAN FOR NEXT SESSION:  Progress strengthening, balance and gait training.  2:36 PM, 03/20/23 Teena Irani, PTA/CLT [place] Ph: [phone]

## 2023-03-21 ENCOUNTER — Inpatient Hospital Stay: Admission: RE | Admit: 2023-03-21 | Payer: Medicaid Other | Source: Ambulatory Visit

## 2023-03-22 ENCOUNTER — Ambulatory Visit (HOSPITAL_COMMUNITY): Payer: Medicaid Other

## 2023-03-22 ENCOUNTER — Encounter (HOSPITAL_COMMUNITY): Payer: Medicaid Other | Admitting: Occupational Therapy

## 2023-03-22 DIAGNOSIS — R29818 Other symptoms and signs involving the nervous system: Secondary | ICD-10-CM | POA: Diagnosis not present

## 2023-03-22 DIAGNOSIS — R29898 Other symptoms and signs involving the musculoskeletal system: Secondary | ICD-10-CM

## 2023-03-22 DIAGNOSIS — R262 Difficulty in walking, not elsewhere classified: Secondary | ICD-10-CM | POA: Diagnosis not present

## 2023-03-22 DIAGNOSIS — I69354 Hemiplegia and hemiparesis following cerebral infarction affecting left non-dominant side: Secondary | ICD-10-CM | POA: Diagnosis not present

## 2023-03-22 DIAGNOSIS — R278 Other lack of coordination: Secondary | ICD-10-CM | POA: Diagnosis not present

## 2023-03-22 DIAGNOSIS — Z7689 Persons encountering health services in other specified circumstances: Secondary | ICD-10-CM | POA: Diagnosis not present

## 2023-03-22 NOTE — Therapy (Addendum)
OUTPATIENT PHYSICAL THERAPY PROGRESS/TREATMENT NOTE Patient Name: Robert Lyons MRN: [id number] DOB:[birth year], 62 y.o., male Today's Date: 03/22/2023  Progress Note Reporting Period 02/10/2023 to 03/22/2023  See note below for Objective Data and Assessment of Progress/Goals.   PCP: [name] MD  REFERRING PROVIDER: Roxan Hockey, MD  END OF SESSION:   PT End of Session - 03/22/23 1602     Visit Number 10    Number of Visits 18    Date for PT Re-Evaluation 04/19/23    Authorization Type [place] Medicaid [employer]    Authorization Time Period 12 visits approved 2/16-4/16    Authorization - Visit Number 9    Authorization - Number of Visits 12    PT Start Time [id number]    PT Stop Time 1555    PT Time Calculation (min) 40 min    Activity Tolerance Patient tolerated treatment well    Behavior During Therapy WFL for tasks assessed/performed              Past Medical History:  Diagnosis Date   GERD (gastroesophageal reflux disease)    Hypertension    Pneumonia    Type 2 diabetes mellitus ([place])    Past Surgical History:  Procedure Laterality Date   No prior surgery     Patient Active Problem List   Diagnosis Date Noted   Paratracheal lymphadenopathy 01/31/2023   Acute CVA (cerebrovascular accident) ([place]) 01/18/2023   Abdominal pain 01/18/2023   Chest pain 01/18/2023   Tobacco abuse 01/18/2023   Hypokalemia 01/18/2023   Vitamin D deficiency 01/05/2023   GERD (gastroesophageal reflux disease) 12/08/2022   Cataracts, both eyes 12/08/2022   Encounter for general adult medical examination with abnormal findings 12/08/2022   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[place]) [id number]   Essential hypertension 07/10/2017   Hyperlipidemia 07/10/2017   Cigarette nicotine dependence without complication [id number]    ONSET DATE: 01/18/2023  REFERRING DIAG: I63.9 (ICD-10-CM) - Acute CVA (cerebrovascular accident) ([place])  THERAPY DIAG:  Difficulty  walking - Plan: PT plan of care cert/re-cert  Other symptoms and signs involving the nervous system - Plan: PT plan of care cert/re-cert  Weakness of left lower extremity - Plan: PT plan of care cert/re-cert  Rationale for Evaluation and Treatment: Rehabilitation  SUBJECTIVE:                                                                                                                                                                                             SUBJECTIVE STATEMENT: Patient thinks that the L LE is getting slower such that he has to drag his leg more  than it used to be. Patient thinks that PT has only helped him a little on his balance.. Patient wishes to continue with PT. Patient denies pain at the moment.  Evaluation: Patient states that everything on the L LE went to a "limp" since he had the stroke in January 2024. Also reports of numbness on the LEs. Patient was admitted to the hospital when he had a stroke in 01/18/2023 and was D/C the other night. Denies getting rehabilitation services while at the hospital but he was immediately referred to outpatient PT evaluation and management. Patient is also receiving outpatient OT services at this time.  Pt accompanied by: significant other  PERTINENT HISTORY: DM, HTN  PAIN:  Are you having pain? No  PRECAUTIONS: None  WEIGHT BEARING RESTRICTIONS: No  FALLS: Has patient fallen in last 6 months? No  LIVING ENVIRONMENT: Lives with:  significant other Lives in: House/apartment Stairs: No Has following equipment at home: Single point cane and shower chair  PLOF: Independent  PATIENT GOALS: "to get my strength back"  OBJECTIVE:   DIAGNOSTIC FINDINGS:  MRI Brain without contrast 01/18/2023 IMPRESSION: 1. 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 2. No intracranial large vessel occlusion or significant stenosis.  COGNITION: Overall cognitive status: Within functional limits for tasks  assessed   SENSATION: Patient reports that the L LE has can feel "more" than the R as to light touch  COORDINATION: Moderately impaired on the L LE as to heel-to-shin No difficulty with alternating seated foot taps  MUSCLE LENGTH: Moderate tightness on B hamstrings and gastrocnemius  POSTURE: rounded shoulders, forward head, and L knee slightly flexed in standing  LOWER EXTREMITY ROM:     Active  Right Eval & 03/22/23 Left Eval & 03/22/23  Hip flexion [facility]  Hip extension [facility]  Hip abduction [facility]  Hip adduction    Hip internal rotation    Hip external rotation    Knee flexion [facility]  Knee extension [facility]  Ankle dorsiflexion [facility] WFL  Ankle plantarflexion [facility] WFL  Ankle inversion    Ankle eversion     (Blank rows = not tested)  LOWER EXTREMITY MMT:    MMT Right Eval Left Eval Right 03/22/23 Left 03/22/23  Hip flexion 5 3+ 4+ 3+  Hip extension 4 3+ 4 3+  Hip abduction 4+ 3+ 4 3+  Hip adduction      Hip internal rotation      Hip external rotation      Knee flexion 5 4- 5 4-  Knee extension 4 3+ 4+ 3+  Ankle dorsiflexion 5 3+ 5 3+  Ankle plantarflexion 5 3+ 5 4-  Ankle inversion      Ankle eversion      (Blank rows = not tested)  BED MOBILITY: (not assessed on 03/22/23) Sit to supine Complete Independence Supine to sit Complete Independence Rolling to Right Complete Independence Rolling to Left Complete Independence  TRANSFERS: Assistive device utilized: None  Sit to stand: SBA Stand to sit: SBA  GAIT: Gait pattern: decreased step length- Right, decreased stance time- Left, decreased stride length, decreased hip/knee flexion- Left, decreased ankle dorsiflexion- Left, Left hip hike, knee flexed in stance- Left, lateral lean- Right, narrow BOS, poor foot clearance- Right, and poor foot clearance- Left.  Distance walked: 207 ft Assistive device utilized: Single point cane Level of assistance: SBA from CGA Comments: mildly unsteady from moderately  unsteady  FUNCTIONAL TESTS:  5 times sit to stand: 16.28  sec from 21.53 sec Timed up and go (TUG): 16.20 sec from 22.83 sec 2 minute walk test:  332 ft (with SPC) from 207 ft  TODAY'S TREATMENT:                                                                                                                              DATE:  03/22/2023 Progress Note (2MWT, TUG, 5TSTS, ROM, MMT) Nustep level 4, 5 minutes, LE only, seat 10, 80-90 SPM Standing:  Gastrocnemius slant board stretch x 30" x 3 Cone taps with L heel, UE support x 10 x 2 Stepping over 4" hurdles x 5 rounds with UE support Stepping over 4" hurdles x 2 rounds without UE support Seated: Sit-to-stand on a standard chair, 5 reps, LE's equal no UE assist  03/20/2023 Nustep level 3, 5 minutes, LE only, seat 10, 80-90 SPM at EOS Standing: heel and toe raises 15X Gastrocnemius slant board stretch x 30" x 3 Lunges onto 4" no UE 2X10 each 6" lateral step ups 2X10 each 1 UE 6" forward step ups 2X10 each 1 UE SLS 15" X 3 each LE Tandem stance 30" X 2 each LE Vectors 10X5" each LE with 1 UE assist Stairs 7" 5RT with 1 HR reciprocally Seated: Sit-to-stand on a standard chair, LE's equal no UE assist  03/15/2023 Nustep level 3, 5 minutes, LE only, seat 10, 80-90 SPM Standing: heel and toe raises 15X (difficulty with toe raise) Gastrocnemius slant board stretch x 30" x 3 Lunges onto 4" no UE 2X10 each 6" lateral step ups 2X10 each 1 UE 6" forward step ups 2X10 each 1 UE SLS 15" X 3 each LE Tandem stance 30" X 2 each LE Vectors 5X5" each LE with 1 UE assist Stairs 7" 5RT with 1 HR reciprocally Seated: Sit-to-stand on a standard chair, LE's equal no UE assist  03/13/2023 Nustep level 3, 5 minutes, LE only, seat 10, 80-90 SPM Standing: Gastrocnemius slant board stretch x 30" x 3 Lunges onto 4" no UE 2X10 each 4" lateral step ups 2X10 each 1 UE 4" forward step ups 2X10 each 1 UE Seated: Sit-to-stand on a standard chair, LLE  slightly behind, with UE assist x 5  03/08/2023 Nustep level 3, 5 minutes, LE only, seat 10, 80-90 SPM Gastrocnemius slant board stretch x 30" x 3 Seated hamstring stretch x 30" x 3 Sit-to-stand on a standard chair, LLE slightly behind, with UE assist x 5 Sit-to-stand slightly elevated seat, B LE slightly behind x 5, no UE assist Standing L TKE, RTB x 3" x 10 x 2 Walking backwards x 40 ft x 2 rounds, CGA Forward stepping over cone obstacles ~ 5 ft x 2 rounds, CGA Side stepping over cone obstacles ~ 5 ft x 2 rounds, CGA   03/06/2023 Nustep level 2 5 minutes UE/LE seat 10  Standing: Heel raises x 20 Marching 3# x 20 Hip abduction 3# x 20 Hip extension 3# x  20 4" box step ups x 10 each 4" box lateral step ups x 10 Tandem stance 2 x 30"  Sit to stand x 10 no UE assist  02/27/2023 Nustep level 2 5 minutes UE/LE seat 10 Standing:  heelraise 20X  Hip abduction 20X each 2#  Hip extension 20X each 2#  Marching alternating with 5" holds 10X each 2#  4" step up laterals 20X each  4" lunge with 1 UE assist 20X each  Tandem stance 30" each lead  Vector 10X5" with 1 UE assist each LE Sit to stands 10X no UE from standard chair    PATIENT EDUCATION: Education details: Educated on the pathoanatomy of CVA. Educated on the goals and course of rehab. Education on falls reduction/prevention at home. Person educated: Patient and significant other Education method: Explanation Education comprehension: verbalized understanding  HOME EXERCISE PROGRAM: Access Code: [email or web address] URL: [email or web address] Date: 02/13/2023 Prepared by: Roseanne Reno Exercises - Seated Long Arc Quad  - 2 x daily - 7 x weekly - 1 sets - 20 reps - 5 sec hold - Sit to Stand  - 2 x daily - 7 x weekly - 1 sets - 10 reps - Standing Hip Abduction  - 2 x daily - 7 x weekly - 1 sets - 20 reps - Standing Hip Extension  - 2 x daily - 7 x weekly - 1 sets - 20 reps - Standing Hip Flexion  - 2 x daily - 7 x  weekly - 1 sets - 20 reps - 5 sec hold   GOALS: Goals reviewed with patient? Yes  SHORT TERM GOALS: Target date: 03/11/2023  Pt will demonstrate indep in HEP to facilitate carry-over of skilled services and improve functional outcomes  Goal status: MET  LONG TERM GOALS: Target date: 04/19/2023  Pt will have a decrease in TUG score by at least 3 sec in order to demonstrate clinically significant improvement in community ambulation Baseline: 16.20 sec Goal status: IN PROGRESS  2.  Pt will decrease 5TSTS by at least 3 seconds in order to demonstrate clinically significant improvement in LE strength  Baseline: 216.28 sec Goal status: IN PROGRESS  3.  Pt will increase 2MWT by at least 40 ft in order to demonstrate clinically significant improvement in community ambulation  Baseline: 332 ft ft Goal status: IN PROGRESS  4.  Pt will demonstrate increase in LE strength to 4+/5 to facilitate ease and safety in ambulation  Baseline: 3+/5 Goal status: IN PROGRESS  5.  Pt will be able to walk on level surfaces > 100 ft without an assistive device with little to no gait deviation Goal status: IN PROGRESS  ASSESSMENT:  CLINICAL IMPRESSION: Patient demonstrated continued improvements in function as indicated by positive significant changes in TUG, 5TSTS, and 2MWT. However, patient still presents with deficits in strength. With this, skilled PT is still required to address the impairments and functional limitations listed below. Interventions today were geared towards LE flexibility, functional strengthening, and neuromuscular re-education. Patient tends to slightly trip on the hurdles during walking due to weakness and impaired proprioception. Patient got easily fatigued with the sit-to-stand due to weakness. Rest periods were given. Provided mild amount of cueing to ensure correct execution of activity with fair to good carry-over particularly with the hurdles due to weakness. To date, skilled PT  is required to address the impairments and improve function.   Focus of session today on LE strengthening, flexibility and balance. Static balance continues to be most challenging for  patient.  Cues needed today to improve posturing and hold times with exercises.  Pt with improved height and control with stair negotiation. Finished up on nustep focusing on LE's only. Pt will continue to benefit from skilled PT to address impairments and improve function.   OBJECTIVE IMPAIRMENTS: Abnormal gait, decreased balance, decreased endurance, decreased mobility, difficulty walking, decreased strength, and impaired flexibility.   ACTIVITY LIMITATIONS: lifting, bending, sitting, standing, squatting, stairs, transfers, and locomotion level  PARTICIPATION LIMITATIONS: meal prep, cleaning, laundry, driving, shopping, and community activity  PERSONAL FACTORS: Fitness are also affecting patient's functional outcome.   REHAB POTENTIAL: Good  CLINICAL DECISION MAKING: Stable/uncomplicated  EVALUATION COMPLEXITY: Low  PLAN:  PT FREQUENCY: 2x/week  PT DURATION: 4 weeks  PLANNED INTERVENTIONS: Therapeutic exercises, Therapeutic activity, Neuromuscular re-education, Balance training, Gait training, Patient/Family education, Self Care, Stair training, and Electrical stimulation  PLAN FOR NEXT SESSION:  Continue POC and may progress as tolerated with emphasis on functioal strengthening, balance and gait training.  Harvie Heck. [place]ippe, PT, DPT, OCS Board-Certified Clinical Specialist in [place] # ([place]): [id number] T 4:37 PM, 03/22/23

## 2023-03-27 DIAGNOSIS — Z419 Encounter for procedure for purposes other than remedying health state, unspecified: Secondary | ICD-10-CM | POA: Diagnosis not present

## 2023-03-27 DIAGNOSIS — Z7689 Persons encountering health services in other specified circumstances: Secondary | ICD-10-CM | POA: Diagnosis not present

## 2023-03-27 NOTE — Patient Instructions (Signed)
AFFAN HARMSEN  03/27/2023     @PREFPERIOPPHARMACY @   Your procedure is scheduled on  03/30/2023.   Report to [facility] at  [facility]  A.[facility].   Call this number if you have problems the morning of surgery:  [phone]  If you experience any cold or flu symptoms such as cough, fever, chills, shortness of breath, etc. between now and your scheduled surgery, please notify us at the above number.   Remember:  Follow the diet and prep instructions given to you by the office.      Your last dose of ozempic should be on 03/22/2023.     Take these medicines the morning of surgery with A SIP OF WATER                amlodipine, antivert(if needed), omeprazole.     Do not wear jewelry, make-up or nail polish.  Do not wear lotions, powders, or perfumes, or deodorant.  Do not shave 48 hours prior to surgery.  Men may shave face and neck.  Do not bring valuables to the hospital.  [facility] is not responsible for any belongings or valuables.  Contacts, dentures or bridgework may not be worn into surgery.  Leave your suitcase in the car.  After surgery it may be brought to your room.  For patients admitted to the hospital, discharge time will be determined by your treatment team.  Patients discharged the day of surgery will not be allowed to drive home and must have someone with them for 24 hours.    Special instructions:   DO NOT smoke tobacco or vape for 24 hours before your procedure.  Please read over the following fact sheets that you were given. Anesthesia Post-op Instructions and Care and Recovery After Surgery      Upper Endoscopy, Adult, Care After After the procedure, it is common to have a sore throat. It is also common to have: Mild stomach pain or discomfort. Bloating. Nausea. Follow these instructions at home: The instructions below may help you care for yourself at home. Your health care provider may give you more instructions. If you have questions, ask  your health care provider. If you were given a sedative during the procedure, it can affect you for several hours. Do not drive or operate machinery until your health care provider says that it is safe. If you will be going home right after the procedure, plan to have a responsible adult: Take you home from the hospital or clinic. You will not be allowed to drive. Care for you for the time you are told. Follow instructions from your health care provider about what you may eat and drink. Return to your normal activities as told by your health care provider. Ask your health care provider what activities are safe for you. Take over-the-counter and prescription medicines only as told by your health care provider. Contact a health care provider if you: Have a sore throat that lasts longer than one day. Have trouble swallowing. Have a fever. Get help right away if you: Vomit blood or your vomit looks like coffee grounds. Have bloody, black, or tarry stools. Have a very bad sore throat or you cannot swallow. Have difficulty breathing or very bad pain in your chest or abdomen. These symptoms may be an emergency. Get help right away. Call 911. Do not wait to see if the symptoms will go away. Do not drive yourself to the hospital. Summary  After the procedure, it is common to have a sore throat, mild stomach discomfort, bloating, and nausea. If you were given a sedative during the procedure, it can affect you for several hours. Do not drive until your health care provider says that it is safe. Follow instructions from your health care provider about what you may eat and drink. Return to your normal activities as told by your health care provider. This information is not intended to replace advice given to you by your health care provider. Make sure you discuss any questions you have with your health care provider. Document Revised: 03/23/2022 Document Reviewed: 03/23/2022 Elsevier Patient Education   [place]. Esophageal Dilatation Esophageal dilatation, also called esophageal dilation, is a procedure to widen or open a blocked or narrowed part of the esophagus. The esophagus is the part of the body that moves food and liquid from the mouth to the stomach. You may need this procedure if: You have a buildup of scar tissue in your esophagus that makes it difficult, painful, or impossible to swallow. This can be caused by gastroesophageal reflux disease (GERD). You have cancer of the esophagus. There is a problem with how food moves through your esophagus. In some cases, you may need this procedure repeated at a later time to dilate the esophagus gradually. Tell a health care provider about: Any allergies you have. All medicines you are taking, including vitamins, herbs, eye drops, creams, and over-the-counter medicines. Any problems you or family members have had with anesthetic medicines. Any blood disorders you have. Any surgeries you have had. Any medical conditions you have. Any antibiotic medicines you are required to take before dental procedures. Whether you are pregnant or may be pregnant. What are the risks? Generally, this is a safe procedure. However, problems may occur, including: Bleeding due to a tear in the lining of the esophagus. A hole, or perforation, in the esophagus. What happens before the procedure? Ask your health care provider about: Changing or stopping your regular medicines. This is especially important if you are taking diabetes medicines or blood thinners. Taking medicines such as aspirin and ibuprofen. These medicines can thin your blood. Do not take these medicines unless your health care provider tells you to take them. Taking over-the-counter medicines, vitamins, herbs, and supplements. Follow instructions from your health care provider about eating or drinking restrictions. Plan to have a responsible adult take you home from the hospital or  clinic. Plan to have a responsible adult care for you for the time you are told after you leave the hospital or clinic. This is important. What happens during the procedure? You may be given a medicine to help you relax (sedative). A numbing medicine may be sprayed into the back of your throat, or you may gargle the medicine. Your health care provider may perform the dilatation using various surgical instruments, such as: Simple dilators. This instrument is carefully placed in the esophagus to stretch it. Guided wire bougies. This involves using an endoscope to insert a wire into the esophagus. A dilator is passed over this wire to enlarge the esophagus. Then the wire is removed. Balloon dilators. An endoscope with a small balloon is inserted into the esophagus. The balloon is inflated to stretch the esophagus and open it up. The procedure may vary among health care providers and hospitals. What can I expect after the procedure? Your blood pressure, heart rate, breathing rate, and blood oxygen level will be monitored until you leave the hospital or clinic.  Your throat may feel slightly sore and numb. This will get better over time. You will not be allowed to eat or drink until your throat is no longer numb. When you are able to drink, urinate, and sit on the edge of the bed without nausea or dizziness, you may be able to return home. Follow these instructions at home: Take over-the-counter and prescription medicines only as told by your health care provider. If you were given a sedative during the procedure, it can affect you for several hours. Do not drive or operate machinery until your health care provider says that it is safe. Plan to have a responsible adult care for you for the time you are told. This is important. Follow instructions from your health care provider about any eating or drinking restrictions. Do not use any products that contain nicotine or tobacco, such as cigarettes,  e-cigarettes, and chewing tobacco. If you need help quitting, ask your health care provider. Keep all follow-up visits. This is important. Contact a health care provider if: You have a fever. You have pain that is not relieved by medicine. Get help right away if: You have chest pain. You have trouble breathing. You have trouble swallowing. You vomit blood. You have black, tarry, or bloody stools. These symptoms may represent a serious problem that is an emergency. Do not wait to see if the symptoms will go away. Get medical help right away. Call your local emergency services (911 in the [place] Do not drive yourself to the hospital. Summary Esophageal dilatation, also called esophageal dilation, is a procedure to widen or open a blocked or narrowed part of the esophagus. Plan to have a responsible adult take you home from the hospital or clinic. For this procedure, a numbing medicine may be sprayed into the back of your throat, or you may gargle the medicine. Do not drive or operate machinery until your health care provider says that it is safe. This information is not intended to replace advice given to you by your health care provider. Make sure you discuss any questions you have with your health care provider. Document Revised: 04/29/2020 Document Reviewed: 04/29/2020 Elsevier Patient Education  [place] After The following information offers guidance on how to care for yourself after your procedure. Your health care provider may also give you more specific instructions. If you have problems or questions, contact your health care provider. What can I expect after the procedure? After the procedure, it is common to have: Tiredness. Little or no memory about what happened during or after the procedure. Impaired judgment when it comes to making decisions. Nausea or vomiting. Some trouble with balance. Follow these instructions at home: For the  time period you were told by your health care provider:  Rest. Do not participate in activities where you could fall or become injured. Do not drive or use machinery. Do not drink alcohol. Do not take sleeping pills or medicines that cause drowsiness. Do not make important decisions or sign legal documents. Do not take care of children on your own. Medicines Take over-the-counter and prescription medicines only as told by your health care provider. If you were prescribed antibiotics, take them as told by your health care provider. Do not stop using the antibiotic even if you start to feel better. Eating and drinking Follow instructions from your health care provider about what you may eat and drink. Drink enough fluid to keep your urine pale yellow. If you vomit:  Drink clear fluids slowly and in small amounts as you are able. Clear fluids include water, ice chips, low-calorie sports drinks, and fruit juice that has water added to it (diluted fruit juice). Eat light and bland foods in small amounts as you are able. These foods include bananas, applesauce, rice, lean meats, toast, and crackers. General instructions  Have a responsible adult stay with you for the time you are told. It is important to have someone help care for you until you are awake and alert. If you have sleep apnea, surgery and some medicines can increase your risk for breathing problems. Follow instructions from your health care provider about wearing your sleep device: When you are sleeping. This includes during daytime naps. While taking prescription pain medicines, sleeping medicines, or medicines that make you drowsy. Do not use any products that contain nicotine or tobacco. These products include cigarettes, chewing tobacco, and vaping devices, such as e-cigarettes. If you need help quitting, ask your health care provider. Contact a health care provider if: You feel nauseous or vomit every time you eat or drink. You  feel light-headed. You are still sleepy or having trouble with balance after 24 hours. You get a rash. You have a fever. You have redness or swelling around the IV site. Get help right away if: You have trouble breathing. You have new confusion after you get home. These symptoms may be an emergency. Get help right away. Call 911. Do not wait to see if the symptoms will go away. Do not drive yourself to the hospital. This information is not intended to replace advice given to you by your health care provider. Make sure you discuss any questions you have with your health care provider. Document Revised: 05/09/2022 Document Reviewed: 05/09/2022 Elsevier Patient Education  [place].

## 2023-03-28 ENCOUNTER — Telehealth: Payer: Self-pay | Admitting: *Deleted

## 2023-03-28 ENCOUNTER — Encounter (HOSPITAL_COMMUNITY)
Admission: RE | Admit: 2023-03-28 | Discharge: 2023-03-28 | Disposition: A | Discharge status: Home / Self Care | Payer: Medicaid Other | Source: Ambulatory Visit | Attending: Internal Medicine | Admitting: Internal Medicine

## 2023-03-28 ENCOUNTER — Ambulatory Visit (HOSPITAL_COMMUNITY): Payer: Medicaid Other | Attending: Family Medicine

## 2023-03-28 ENCOUNTER — Other Ambulatory Visit: Payer: Self-pay | Admitting: Internal Medicine

## 2023-03-28 ENCOUNTER — Encounter: Payer: Self-pay | Admitting: Gastroenterology

## 2023-03-28 ENCOUNTER — Encounter (HOSPITAL_COMMUNITY): Payer: Self-pay

## 2023-03-28 VITALS — BP 126/76 | HR 91 | Temp 97.7°F | Resp 18 | Ht 71.0 in | Wt 193.6 lb

## 2023-03-28 DIAGNOSIS — R262 Difficulty in walking, not elsewhere classified: Secondary | ICD-10-CM

## 2023-03-28 DIAGNOSIS — Z01812 Encounter for preprocedural laboratory examination: Secondary | ICD-10-CM | POA: Insufficient documentation

## 2023-03-28 DIAGNOSIS — Z7689 Persons encountering health services in other specified circumstances: Secondary | ICD-10-CM | POA: Diagnosis not present

## 2023-03-28 DIAGNOSIS — R29818 Other symptoms and signs involving the nervous system: Secondary | ICD-10-CM | POA: Insufficient documentation

## 2023-03-28 DIAGNOSIS — I69354 Hemiplegia and hemiparesis following cerebral infarction affecting left non-dominant side: Secondary | ICD-10-CM | POA: Insufficient documentation

## 2023-03-28 DIAGNOSIS — E119 Type 2 diabetes mellitus without complications: Secondary | ICD-10-CM | POA: Insufficient documentation

## 2023-03-28 DIAGNOSIS — R29898 Other symptoms and signs involving the musculoskeletal system: Secondary | ICD-10-CM | POA: Insufficient documentation

## 2023-03-28 DIAGNOSIS — R278 Other lack of coordination: Secondary | ICD-10-CM | POA: Insufficient documentation

## 2023-03-28 HISTORY — DX: Cerebral infarction, unspecified: I63.9

## 2023-03-28 LAB — BASIC METABOLIC PANEL
Anion gap: 9 (ref 5–15)
BUN: 9 mg/dL (ref 8–23)
CO2: 26 mmol/L (ref 22–32)
Calcium: 9.5 mg/dL (ref 8.9–10.3)
Chloride: 102 mmol/L (ref 98–111)
Creatinine, Ser: 1.08 mg/dL (ref 0.61–1.24)
GFR, Estimated: >60 mL/min (ref >60)
Glucose, Bld: 235 mg/dL — ABNORMAL HIGH (ref 70–99)
Potassium: 3.5 mmol/L (ref 3.5–5.1)
Sodium: 137 mmol/L (ref 135–145)

## 2023-03-28 NOTE — Telephone Encounter (Signed)
Jacqulynn Cadet, RN  Nori Riis, Elon Alas, DO; [place], [place], CMA Hey! Robert Lyons had a stroke and was hospitalized in January of this year.  We will need to reschedule in 6 months from the time of stroke per Dr Briant Cedar.  Please advise. Thank you

## 2023-03-28 NOTE — Therapy (Signed)
  OUTPATIENT PHYSICAL THERAPY NOTE Patient Name: Robert Lyons MRN: [id number] DOB:March 08, 1961, 62 y.o., male Today's Date: 03/28/2023  END OF SESSION:   PT End of Session - 03/28/23 1558     Visit Number 10    Number of Visits 18    Date for PT Re-Evaluation 04/19/23    Authorization Type [place] [employer]    Authorization Time Period 12 visits approved 2/16-4/16    Authorization - Number of Visits 12    PT Start Time [id number]    PT Stop Time 1525    PT Time Calculation (min) 10 min            Patient arrived to the clinic today with his SPC. However, patient states that he's not feeling good and he's having a bad day. Patient states that they canceled the procedures involving his throat. Patient states that he has some difficulty with swallowing. Denies having speech therapy for the swallowing. Patient thinks he doesn't need to have speech therapy. Despite not feeling well, patient attempted to do Nustep level 4-5, 2-3 minutes, LE only, seat 10, 80-90 SPM. However, patient stopped on the 3rd minute saying that he cannot finish the session anymore because he feels tired and exhausted so patient decided to end the session early. Patient will try again on the next session.  Harvie Heck. Vinay Ertl, PT, DPT, OCS Board-Certified Clinical Specialist in [place] # ([place]): [id number] T 4:02 PM, 03/28/23

## 2023-03-28 NOTE — Telephone Encounter (Signed)
Unable to leave message, mailbox is full.

## 2023-03-28 NOTE — Telephone Encounter (Signed)
Pt was informed of providers recommendations and instructions. Verbalized understanding.

## 2023-03-30 ENCOUNTER — Ambulatory Visit (HOSPITAL_COMMUNITY): Admission: RE | Admit: 2023-03-30 | Payer: Medicaid Other | Source: Ambulatory Visit

## 2023-03-30 ENCOUNTER — Encounter (HOSPITAL_COMMUNITY): Admission: RE | Payer: Self-pay | Source: Ambulatory Visit

## 2023-03-30 ENCOUNTER — Ambulatory Visit (HOSPITAL_COMMUNITY): Payer: Medicaid Other

## 2023-03-30 SURGERY — ESOPHAGOGASTRODUODENOSCOPY (EGD) WITH PROPOFOL
Anesthesia: Monitor Anesthesia Care

## 2023-04-04 ENCOUNTER — Ambulatory Visit (HOSPITAL_COMMUNITY): Payer: Medicaid Other | Admitting: Occupational Therapy

## 2023-04-04 ENCOUNTER — Encounter (HOSPITAL_COMMUNITY): Payer: Self-pay | Admitting: Occupational Therapy

## 2023-04-04 DIAGNOSIS — R29898 Other symptoms and signs involving the musculoskeletal system: Secondary | ICD-10-CM | POA: Diagnosis not present

## 2023-04-04 DIAGNOSIS — R29818 Other symptoms and signs involving the nervous system: Secondary | ICD-10-CM

## 2023-04-04 DIAGNOSIS — R262 Difficulty in walking, not elsewhere classified: Secondary | ICD-10-CM | POA: Diagnosis not present

## 2023-04-04 DIAGNOSIS — I69354 Hemiplegia and hemiparesis following cerebral infarction affecting left non-dominant side: Secondary | ICD-10-CM

## 2023-04-04 DIAGNOSIS — R278 Other lack of coordination: Secondary | ICD-10-CM | POA: Diagnosis not present

## 2023-04-04 DIAGNOSIS — Z7689 Persons encountering health services in other specified circumstances: Secondary | ICD-10-CM | POA: Diagnosis not present

## 2023-04-04 NOTE — Patient Instructions (Signed)

## 2023-04-04 NOTE — Therapy (Unsigned)
OUTPATIENT OCCUPATIONAL THERAPY NEURO TREATMENT NOTE    Patient Name: Robert Lyons MRN: [id number] DOB:October 29, 1961, 62 y.o., male Today's Date: 04/04/2023  PCP: Billie Ladeixon, Phillip E, MD REFERRING PROVIDER: Shon HaleEmokpae, Courage, MD   END OF SESSION:  OT End of Session - 04/04/23 1439     Visit Number 11    Number of Visits 13    Date for OT Re-Evaluation 04/14/23    Authorization Type Managed medicaid    Authorization Time Period 4 visits (04/04/23-05/10/23)    Authorization - Visit Number 1    Authorization - Number of Visits 4    OT Start Time 1435    OT Stop Time 1515    OT Time Calculation (min) 40 min    Activity Tolerance Patient tolerated treatment well    Behavior During Therapy WFL for tasks assessed/performed            Past Medical History:  Diagnosis Date   GERD (gastroesophageal reflux disease)    Hypertension    Pneumonia    Stroke    Type 2 diabetes mellitus    Past Surgical History:  Procedure Laterality Date   No prior surgery     Patient Active Problem List   Diagnosis Date Noted   Paratracheal lymphadenopathy 01/31/2023   Acute CVA (cerebrovascular accident) 01/18/2023   Abdominal pain 01/18/2023   Chest pain 01/18/2023   Tobacco abuse 01/18/2023   Hypokalemia 01/18/2023   Vitamin D deficiency 01/05/2023   GERD (gastroesophageal reflux disease) 12/08/2022   Cataracts, both eyes 12/08/2022   Encounter for general adult medical examination with abnormal findings 12/08/2022   Type 2 diabetes mellitus with ophthalmic complication 08/15/2017   Essential hypertension 07/10/2017   Hyperlipidemia 07/10/2017   Cigarette nicotine dependence without complication 07/10/2017    ONSET DATE: 01/18/23  REFERRING DIAG: [place] CVA  THERAPY DIAG:  Other lack of coordination  Hemiplegia and hemiparesis following cerebral infarction affecting left non-dominant side  Other symptoms and signs involving the nervous system  Rationale for Evaluation and  Treatment: Rehabilitation  SUBJECTIVE:   SUBJECTIVE STATEMENT: "These shoulder exercises hurt" Pt accompanied by: significant other  PERTINENT HISTORY: Patient states that everything on the L LE went to a "limp" since he had the stroke in January 2024. Also reports of numbness on the LEs. Patient was admitted to the hospital when he had a stroke on 01/18/2023.  PRECAUTIONS: None  WEIGHT BEARING RESTRICTIONS: No  PAIN:  Are you having pain? No  FALLS: Has patient fallen in last 6 months? No  PATIENT GOALS: To get strength back in my L side.   OBJECTIVE:   HAND DOMINANCE: Right  ADLs: Overall ADLs: Pt reports difficulty getting his clothes on, states he gets mixed up with it at times. Additionally due to strength and endurance he has a difficult time cooking and cleaning. Pt's partner assists with medication management.   MOBILITY STATUS: Independent  POSTURE COMMENTS:  No Significant postural limitations Sitting balance: Moves/returns truncal midpoint >2 inches in all planes  ACTIVITY TOLERANCE: Activity tolerance: Pt reports that his endurance has been affected, where he has difficulty sustaining activities due to getting out of breath and weak.   FUNCTIONAL OUTCOME MEASURES: Quick Dash: 45.45 03/15/23: 36.36  UPPER EXTREMITY ROM:    Active ROM Left eval Left 03/15/23  Shoulder flexion 132 122  Shoulder abduction 87 100  Shoulder internal rotation 90 90  Shoulder external rotation 45 72  Elbow flexion 69 139  Elbow extension -3 -3  Wrist flexion 30 75  Wrist extension -2 12  Wrist ulnar deviation 28 49  Wrist radial deviation -5 2  Wrist pronation [facility]  Wrist supination 45 80  (Blank rows = not tested)  UPPER EXTREMITY MMT:     MMT Left eval Left 03/15/23  Shoulder flexion 4/5 4+/5  Shoulder abduction 4-/5 4+/5  Shoulder adduction 4/5 4+/5  Shoulder extension 4+/5 5/5  Shoulder internal rotation 4-/5 4+/5  Shoulder external rotation 4-/5 4+/5   Elbow flexion 4/5 5/5  Elbow extension 4+/5 5/5  Wrist flexion 4/5 4+/5  Wrist extension 3+/5 4/5  Wrist ulnar deviation 4/5 4+/5  Wrist radial deviation 3/5 4-/5  Wrist pronation 5/5 5/5  Wrist supination 4+/5 5/5  (Blank rows = not tested)  HAND FUNCTION: Grip strength: Right: 70 lbs; Left: 20 lbs, Lateral pinch: Right: 20 lbs, Left: 3 lbs, and 3 point pinch: Right: 10 lbs, Left: 3 lbs Grip strength: 03/15/23; Left: 26 lbs, Lateral pinch: Left: 12 lbs, and 3 point pinch: Left: 6 lbs  COORDINATION: 9 Hole Peg test: Right: 33.74 sec; Left: 46.55 sec 9 Hole Peg test: 03/15/23; Left: 46.09 sec  SENSATION: Light touch: Impaired   EDEMA: Mild edema noted in the L hand  COGNITION: Overall cognitive status: Within functional limits for tasks assessed  VISION: Subjective report: Pt reports he has severe cataracts in both eyes. Was supposed to have surgery, but it got postponed.  Baseline vision: Wears glasses all the time Visual history: cataracts  VISION ASSESSMENT: Not tested  Patient has difficulty with following activities due to following visual impairments: Driving, reading  PERCEPTION: WFL  PRAXIS: WFL  OBSERVATIONS: At times pt demonstrates some word finding difficulties.    TODAY'S TREATMENT:                                                                                                                              DATE:   04/04/23 -scapular strengthening: red theraband, extension, retraction, rows, x10 -Shoulder Strengthening: 4lb dumbbell, flexion, abduction, protraction, horizontal abduction, er/IR, x10 -Wrist with red weighted ball: flexion/extension, ulnar/radial deviation, supination/pronation, x10 -Gripper: 35lbs, 10 medium beads in vertical, 5 small beads in horizontal -digiflex: 7lbs, full squeezes x10, each finger squeeze x6 -Grooved peg board, 24 pegs  03/20/23 -Pinch Strengthening: red, green, blue, black resistance clips, tripod pinch up, lateral  pinch down x5 each -Shoulder Strengthening: 3lb dumbbell, flexion, abduction, protraction, horizontal abduction, er/IR, x10 -Nuts and Bolts: 2 large bolts, 2 medium, 2 small, and 2 tiny bolts, unscrewing and screwing back -sponges: pinch and hold 23 sponges -Pinch and stack 6 cubes using green resistance clips  03/15/23 -A/ROM: flexion, abduction, horizontal abduction, protraction, er/IR, x10 -Wrist ROM: flexion/extension, ulnar/radial deviation, supination/pronation, x10 -Shoulder Strengthening: 3lb dumbbell, flexion, abduction, protraction, horizontal abduction, er/IR, x10 -9 hole peg test -Measurements for Reassessment   PATIENT EDUCATION: Education details: scapular strengthening Person educated: Patient and Spouse Education method: Explanation Education comprehension: verbalized understanding  HOME EXERCISE PROGRAM: 2/19: Weight bearing (reaching, weight shifting) 2/26: Coins and card tasks 2/28: Wrist Strengthening 3/4: Shoulder Strengthening 3/13: Theraputty 4/9: scapular strengthening    GOALS: Goals reviewed with patient? Yes  SHORT TERM GOALS: Target date: 03/17/23  Pt will be provided with and educated on HEP to improve mobility in LUE required for ADL completion.  Goal status: IN PROGRESS  2.  Pt will improve strength to 4+/5 in order to improve ability to perform lifting tasks during meal preparation and cleaning tasks.  Goal status: IN PROGRESS  3.  Pt will increase left grip strength by 10# and pinch strength by 3# to improve ability to grasp and hold pots and pans during simple meal preparation.  Goal status: IN PROGRESS  4.  Pt will increase left hand coordination by completing 9 hole peg test in under 35 seconds, improving ability to manipulate lids and tops on jars, bottles, etc.  Goal status: IN PROGRESS  5.  Pt will improve LUE motor planning by completing functional reaching tasks with minimal compensatory assist from scapular and trapezius regions.   Goal status: MET  6. Pt will increase A/ROM of LUE by 40 degrees in all directions in order to reach overhead and behind back during dressing and bathing tasks.   Goal status: IN PROGRESS   ASSESSMENT:  CLINICAL IMPRESSION: This session pt continued to work on strengthening and motor control. He reports pain in his shoulder during strengthening along his bicep, however he was able to continue with exercises demonstrating good movement pattern. Pt continued session with pinch strengthening and motor planning tasks, where he as some difficulty with stacking and achieving smooth, small movements, especially with screwing the nuts back on the bolts. OT providing verbal and tactile cuing for positioning and technique throughout session.   PERFORMANCE DEFICITS: in functional skills including ADLs, IADLs, coordination, sensation, ROM, strength, muscle spasms, Fine motor control, Gross motor control, body mechanics, vision, and UE functional use, cognitive skills including attention, memory, problem solving, and safety awareness.   PLAN:  OT FREQUENCY: 1x/week  OT DURATION: 4 weeks  PLANNED INTERVENTIONS: self care/ADL training, therapeutic exercise, therapeutic activity, neuromuscular re-education, passive range of motion, functional mobility training, electrical stimulation, ultrasound, paraffin, moist heat, cryotherapy, patient/family education, cognitive remediation/compensation, energy conservation, and DME and/or AE instructions  RECOMMENDED OTHER SERVICES: PT and Speech for Cognition  CONSULTED AND AGREED WITH PLAN OF CARE: Patient  PLAN FOR NEXT SESSION: A/ROM, Restart Weight bearing, Closed chain exercises, fine motor tasks, strengthening tasks.    Trish Mage, OTR/L [facility] [phone] Lin Glazier Rosemarie Beath, OT 04/04/2023, 2:42 PM

## 2023-04-05 ENCOUNTER — Ambulatory Visit (HOSPITAL_COMMUNITY): Payer: Medicaid Other

## 2023-04-05 ENCOUNTER — Other Ambulatory Visit (HOSPITAL_COMMUNITY): Payer: Self-pay | Admitting: Otolaryngology

## 2023-04-05 DIAGNOSIS — R591 Generalized enlarged lymph nodes: Secondary | ICD-10-CM

## 2023-04-06 ENCOUNTER — Encounter (HOSPITAL_COMMUNITY): Payer: Self-pay

## 2023-04-06 ENCOUNTER — Ambulatory Visit (HOSPITAL_COMMUNITY): Payer: Medicaid Other

## 2023-04-06 DIAGNOSIS — R29898 Other symptoms and signs involving the musculoskeletal system: Secondary | ICD-10-CM | POA: Diagnosis not present

## 2023-04-06 DIAGNOSIS — R29818 Other symptoms and signs involving the nervous system: Secondary | ICD-10-CM | POA: Diagnosis not present

## 2023-04-06 DIAGNOSIS — Z7689 Persons encountering health services in other specified circumstances: Secondary | ICD-10-CM | POA: Diagnosis not present

## 2023-04-06 DIAGNOSIS — I69354 Hemiplegia and hemiparesis following cerebral infarction affecting left non-dominant side: Secondary | ICD-10-CM | POA: Diagnosis not present

## 2023-04-06 DIAGNOSIS — R278 Other lack of coordination: Secondary | ICD-10-CM | POA: Diagnosis not present

## 2023-04-06 DIAGNOSIS — R262 Difficulty in walking, not elsewhere classified: Secondary | ICD-10-CM

## 2023-04-06 NOTE — Therapy (Addendum)
OUTPATIENT PHYSICAL THERAPY TREATMENT NOTE Patient Name: Robert Lyons MRN: [id number] DOB:02/22/1961, 62 y.o., male Today's Date: 04/06/2023  PCP: Billie Lade MD  REFERRING PROVIDER: Shon Hale, MD  END OF SESSION:   PT End of Session - 04/06/23 1134     Visit Number 11    Number of Visits 18    Date for PT Re-Evaluation 04/19/23    Authorization Type [place] [employer]    Authorization Time Period 12 visits approved 2/16-4/16    Authorization - Visit Number 10    Authorization - Number of Visits 12    Progress Note Due on Visit 8    PT Start Time 1135    PT Stop Time 1215    PT Time Calculation (min) 40 min    Equipment Utilized During Treatment Gait belt    Activity Tolerance Patient tolerated treatment well    Behavior During Therapy WFL for tasks assessed/performed              Past Medical History:  Diagnosis Date   GERD (gastroesophageal reflux disease)    Hypertension    Pneumonia    Stroke    Type 2 diabetes mellitus    Past Surgical History:  Procedure Laterality Date   No prior surgery     Patient Active Problem List   Diagnosis Date Noted   Paratracheal lymphadenopathy 01/31/2023   Acute CVA (cerebrovascular accident) 01/18/2023   Abdominal pain 01/18/2023   Chest pain 01/18/2023   Tobacco abuse 01/18/2023   Hypokalemia 01/18/2023   Vitamin D deficiency 01/05/2023   GERD (gastroesophageal reflux disease) 12/08/2022   Cataracts, both eyes 12/08/2022   Encounter for general adult medical examination with abnormal findings 12/08/2022   Type 2 diabetes mellitus with ophthalmic complication 08/15/2017   Essential hypertension 07/10/2017   Hyperlipidemia 07/10/2017   Cigarette nicotine dependence without complication 07/10/2017    ONSET DATE: 01/18/2023  REFERRING DIAG: I63.9 (ICD-10-CM) - Acute CVA (cerebrovascular accident) (HCC)  THERAPY DIAG:  Other symptoms and signs involving the nervous system  Difficulty  walking  Weakness of left lower extremity  Rationale for Evaluation and Treatment: Rehabilitation  SUBJECTIVE:                                                                                                                                                                                             SUBJECTIVE STATEMENT: Patient thinks that the L LE is getting slower such that he has to drag his leg more than it used to be. Patient thinks that PT has only helped him a little on his balance.. Patient wishes to continue  with PT. Patient denies pain at the moment.  Evaluation: Patient states that everything on the L LE went to a "limp" since he had the stroke in January 2024. Also reports of numbness on the LEs. Patient was admitted to the hospital when he had a stroke in 01/18/2023 and was D/C the other night. Denies getting rehabilitation services while at the hospital but he was immediately referred to outpatient PT evaluation and management. Patient is also receiving outpatient OT services at this time.  Pt accompanied by: significant other  PERTINENT HISTORY: DM, HTN  PAIN:  Are you having pain? No  PRECAUTIONS: None  WEIGHT BEARING RESTRICTIONS: No  FALLS: Has patient fallen in last 6 months? No  LIVING ENVIRONMENT: Lives with:  significant other Lives in: House/apartment Stairs: No Has following equipment at home: Single point cane and shower chair  PLOF: Independent  PATIENT GOALS: "to get my strength back"  OBJECTIVE:   DIAGNOSTIC FINDINGS:  MRI Brain without contrast 01/18/2023 IMPRESSION: 1. 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 2. No intracranial large vessel occlusion or significant stenosis.  COGNITION: Overall cognitive status: Within functional limits for tasks assessed   SENSATION: Patient reports that the L LE has can feel "more" than the R as to light touch  COORDINATION: Moderately impaired on the L LE as to heel-to-shin No difficulty with  alternating seated foot taps  MUSCLE LENGTH: Moderate tightness on B hamstrings and gastrocnemius  POSTURE: rounded shoulders, forward head, and L knee slightly flexed in standing  LOWER EXTREMITY ROM:     Active  Right Eval & 03/22/23 Left Eval & 03/22/23  Hip flexion [facility]  Hip extension [facility]  Hip abduction [facility]  Hip adduction    Hip internal rotation    Hip external rotation    Knee flexion [facility]  Knee extension [facility]  Ankle dorsiflexion [facility] WFL  Ankle plantarflexion [facility] WFL  Ankle inversion    Ankle eversion     (Blank rows = not tested)  LOWER EXTREMITY MMT:    MMT Right Eval Left Eval Right 03/22/23 Left 03/22/23  Hip flexion 5 3+ 4+ 3+  Hip extension 4 3+ 4 3+  Hip abduction 4+ 3+ 4 3+  Hip adduction      Hip internal rotation      Hip external rotation      Knee flexion 5 4- 5 4-  Knee extension 4 3+ 4+ 3+  Ankle dorsiflexion 5 3+ 5 3+  Ankle plantarflexion 5 3+ 5 4-  Ankle inversion      Ankle eversion      (Blank rows = not tested)  BED MOBILITY: (not assessed on 03/22/23) Sit to supine Complete Independence Supine to sit Complete Independence Rolling to Right Complete Independence Rolling to Left Complete Independence  TRANSFERS: Assistive device utilized: None  Sit to stand: SBA Stand to sit: SBA  GAIT: Gait pattern: decreased step length- Right, decreased stance time- Left, decreased stride length, decreased hip/knee flexion- Left, decreased ankle dorsiflexion- Left, Left hip hike, knee flexed in stance- Left, lateral lean- Right, narrow BOS, poor foot clearance- Right, and poor foot clearance- Left.  Distance walked: 207 ft Assistive device utilized: Single point cane Level of assistance: SBA from CGA Comments: mildly unsteady from moderately unsteady  FUNCTIONAL TESTS:  5 times sit to stand: 16.28 sec from 21.53 sec Timed up and go (TUG): 16.20 sec from 22.83 sec 2 minute walk test:  332 ft (with SPC)  from 207 ft  TODAY'S  TREATMENT:                                                                                                                              DATE:  04/06/23:  Standing:  Squats front of chair- cueing for mechanics SLS 3 times both directions Vector stance 5x 5" with 1 UE A Tandem stance 3x 30" Tandem gait 2RT 7in step height reciprocal pattern 1 HR x 5 [employer] 3Pl 5x retro and sidestep both directions Lunges onto 6in step height with no UE A 15x Toe tapping 6in, attempted with UE flexion, increased difficutly with coordination  03/22/2023 Progress Note ( , TUG, 5TSTS, ROM, MMT) Nustep level 4, 5 minutes, LE only, seat 10, 80-90 SPM Standing:  Gastrocnemius slant board stretch x 30" x 3 Cone taps with L heel, UE support x 10 x 2 Stepping over 4" hurdles x 5 rounds with UE support Stepping over 4" hurdles x 2 rounds without UE support Seated: Sit-to-stand on a standard chair, 5 reps, LE's equal no UE assist  03/20/2023 Nustep level 3, 5 minutes, LE only, seat 10, 80-90 SPM at EOS Standing: heel and toe raises 15X Gastrocnemius slant board stretch x 30" x 3 Lunges onto 4" no UE 2X10 each 6" lateral step ups 2X10 each 1 UE 6" forward step ups 2X10 each 1 UE SLS 15" X 3 each LE Tandem stance 30" X 2 each LE Vectors 10X5" each LE with 1 UE assist Stairs 7" 5RT with 1 HR reciprocally Seated: Sit-to-stand on a standard chair, LE's equal no UE assist  03/15/2023 Nustep level 3, 5 minutes, LE only, seat 10, 80-90 SPM Standing: heel and toe raises 15X (difficulty with toe raise) Gastrocnemius slant board stretch x 30" x 3 Lunges onto 4" no UE 2X10 each 6" lateral step ups 2X10 each 1 UE 6" forward step ups 2X10 each 1 UE SLS 15" X 3 each LE Tandem stance 30" X 2 each LE Vectors 5X5" each LE with 1 UE assist Stairs 7" 5RT with 1 HR reciprocally Seated: Sit-to-stand on a standard chair, LE's equal no UE assist  03/13/2023 Nustep level 3, 5 minutes, LE only, seat 10, 80-90  SPM Standing: Gastrocnemius slant board stretch x 30" x 3 Lunges onto 4" no UE 2X10 each 4" lateral step ups 2X10 each 1 UE 4" forward step ups 2X10 each 1 UE Seated: Sit-to-stand on a standard chair, LLE slightly behind, with UE assist x 5  03/08/2023 Nustep level 3, 5 minutes, LE only, seat 10, 80-90 SPM Gastrocnemius slant board stretch x 30" x 3 Seated hamstring stretch x 30" x 3 Sit-to-stand on a standard chair, LLE slightly behind, with UE assist x 5 Sit-to-stand slightly elevated seat, B LE slightly behind x 5, no UE assist Standing L TKE, RTB x 3" x 10 x 2 Walking backwards x 40 ft x 2 rounds, CGA Forward stepping over cone obstacles ~  5 ft x 2 rounds, CGA Side stepping over cone obstacles ~ 5 ft x 2 rounds, CGA   03/06/2023 Nustep level 2 5 minutes UE/LE seat 10  Standing: Heel raises x 20 Marching 3# x 20 Hip abduction 3# x 20 Hip extension 3# x 20 4" box step ups x 10 each 4" box lateral step ups x 10 Tandem stance 2 x 30"  Sit to stand x 10 no UE assist  02/27/2023 Nustep level 2 5 minutes UE/LE seat 10 Standing:  heelraise 20X  Hip abduction 20X each 2#  Hip extension 20X each 2#  Marching alternating with 5" holds 10X each 2#  4" step up laterals 20X each  4" lunge with 1 UE assist 20X each  Tandem stance 30" each lead  Vector 10X5" with 1 UE assist each LE Sit to stands 10X no UE from standard chair    PATIENT EDUCATION: Education details: Educated on the pathoanatomy of CVA. Educated on the goals and course of rehab. Education on falls reduction/prevention at home. Person educated: Patient and significant other Education method: Explanation Education comprehension: verbalized understanding  HOME EXERCISE PROGRAM: Access Code: [email or web address] URL: [email or web address] Date: 02/13/2023 Prepared by: Emeline GinsAmy Frazier Exercises - Seated Long Arc Quad  - 2 x daily - 7 x weekly - 1 sets - 20 reps - 5 sec hold - Sit to Stand  - 2 x daily - 7 x  weekly - 1 sets - 10 reps - Standing Hip Abduction  - 2 x daily - 7 x weekly - 1 sets - 20 reps - Standing Hip Extension  - 2 x daily - 7 x weekly - 1 sets - 20 reps - Standing Hip Flexion  - 2 x daily - 7 x weekly - 1 sets - 20 reps - 5 sec hold   GOALS: Goals reviewed with patient? Yes  SHORT TERM GOALS: Target date: 03/11/2023  Pt will demonstrate indep in HEP to facilitate carry-over of skilled services and improve functional outcomes  Goal status: MET  LONG TERM GOALS: Target date: 04/19/2023  Pt will have a decrease in TUG score by at least 3 sec in order to demonstrate clinically significant improvement in community ambulation Baseline: 16.20 sec Goal status: IN PROGRESS  2.  Pt will decrease 5TSTS by at least 3 seconds in order to demonstrate clinically significant improvement in LE strength  Baseline: 216.28 sec Goal status: IN PROGRESS  3.  Pt will increase 2MWT by at least 40 ft in order to demonstrate clinically significant improvement in community ambulation  Baseline: 332 ft ft Goal status: IN PROGRESS  4.  Pt will demonstrate increase in LE strength to 4+/5 to facilitate ease and safety in ambulation  Baseline: 3+/5 Goal status: IN PROGRESS  5.  Pt will be able to walk on level surfaces > 100 ft without an assistive device with little to no gait deviation Goal status: IN PROGRESS  ASSESSMENT:  CLINICAL IMPRESSION: Session focus with functional strengthening and balance training.  All activities with min guard for safety with occasional need for HHA to correct LOB during static balance activities.  Pt with some difficulty with coordination required increased assistance for safety.  Cueing for posture and core engagement with all exercises required to assist with balance.  Pt limited by fatigue at EOS.  Discussed importance of compliance with HEP for maximal benefits, pt will benefit form additional balance activities added to HEP. Pt encouraged to complete tandem  stance and SLS infront  of counter for safety, needs printout next session.   OBJECTIVE IMPAIRMENTS: Abnormal gait, decreased balance, decreased endurance, decreased mobility, difficulty walking, decreased strength, and impaired flexibility.   ACTIVITY LIMITATIONS: lifting, bending, sitting, standing, squatting, stairs, transfers, and locomotion level  PARTICIPATION LIMITATIONS: meal prep, cleaning, laundry, driving, shopping, and community activity  PERSONAL FACTORS: Fitness are also affecting patient's functional outcome.   REHAB POTENTIAL: Good  CLINICAL DECISION MAKING: Stable/uncomplicated  EVALUATION COMPLEXITY: Low  PLAN:  PT FREQUENCY: 2x/week  PT DURATION: 4 weeks  PLANNED INTERVENTIONS: Therapeutic exercises, Therapeutic activity, Neuromuscular re-education, Balance training, Gait training, Patient/Family education, Self Care, Stair training, and Electrical stimulation  PLAN FOR NEXT SESSION:  Continue POC and may progress as tolerated with emphasis on functioal strengthening, balance and gait training.  Add balance activities printout to HEP.  Becky Sax, LPTA/CLT; CBIS [phone]  12:42 PM, 04/06/23

## 2023-04-10 ENCOUNTER — Ambulatory Visit (HOSPITAL_COMMUNITY): Payer: Medicaid Other | Admitting: Occupational Therapy

## 2023-04-10 ENCOUNTER — Encounter (HOSPITAL_COMMUNITY): Payer: Self-pay | Admitting: Occupational Therapy

## 2023-04-10 DIAGNOSIS — R262 Difficulty in walking, not elsewhere classified: Secondary | ICD-10-CM | POA: Diagnosis not present

## 2023-04-10 DIAGNOSIS — I69354 Hemiplegia and hemiparesis following cerebral infarction affecting left non-dominant side: Secondary | ICD-10-CM | POA: Diagnosis not present

## 2023-04-10 DIAGNOSIS — R29898 Other symptoms and signs involving the musculoskeletal system: Secondary | ICD-10-CM | POA: Diagnosis not present

## 2023-04-10 DIAGNOSIS — R29818 Other symptoms and signs involving the nervous system: Secondary | ICD-10-CM

## 2023-04-10 DIAGNOSIS — R278 Other lack of coordination: Secondary | ICD-10-CM | POA: Diagnosis not present

## 2023-04-10 DIAGNOSIS — Z7689 Persons encountering health services in other specified circumstances: Secondary | ICD-10-CM | POA: Diagnosis not present

## 2023-04-10 NOTE — Patient Instructions (Signed)
Therapy Ball Strengthening Exercises: Complete all exercises 10-15X each, 2x/day.   1) Overhead press: Hold a medicine ball or other ball at your chest. Next, extend your elbows and push the ball over your head as shown. Return to starting position and repeat.     2) Chest press: Hold a medicine ball or other ball at your chest. Next, extend your elbows and push the ball outward in front of your body as shown. Return to starting position and repeat.     3) Ball circles:  Hold a medicine ball or other ball at your chest. Next, extend your elbows and push the ball outward in front of your body as shown. Then, move the ball in a circular pattern for several revolutions and then reverse the direction.     4) Flexion: Start holding an exercise ball out in front of your body with elbows extended. Then, slowly lift the ball up over head. Return the ball to starting position and repeat.     5) Ball diagonals: Begin holding a ball with both hands and then move it through a diagonal pattern from your hip to over your opposite shoulder as shown.    

## 2023-04-10 NOTE — Therapy (Signed)
OUTPATIENT OCCUPATIONAL THERAPY NEURO TREATMENT NOTE    Patient Name: Robert Lyons MRN: [id number] DOB:December 13, 1961, 62 y.o., male Today's Date: 04/10/2023  PCP: Billie Lade, MD REFERRING PROVIDER: Shon Hale, MD   END OF SESSION:  OT End of Session - 04/10/23 1349     Visit Number 12    Number of Visits 13    Date for OT Re-Evaluation 04/14/23    Authorization Type Managed medicaid    Authorization Time Period 4 visits (04/04/23-05/10/23)    Authorization - Visit Number 2    Authorization - Number of Visits 4    OT Start Time 1345    OT Stop Time 1425    OT Time Calculation (min) 40 min    Activity Tolerance Patient tolerated treatment well    Behavior During Therapy WFL for tasks assessed/performed            Past Medical History:  Diagnosis Date   GERD (gastroesophageal reflux disease)    Hypertension    Pneumonia    Stroke    Type 2 diabetes mellitus    Past Surgical History:  Procedure Laterality Date   No prior surgery     Patient Active Problem List   Diagnosis Date Noted   Paratracheal lymphadenopathy 01/31/2023   Acute CVA (cerebrovascular accident) 01/18/2023   Abdominal pain 01/18/2023   Chest pain 01/18/2023   Tobacco abuse 01/18/2023   Hypokalemia 01/18/2023   Vitamin D deficiency 01/05/2023   GERD (gastroesophageal reflux disease) 12/08/2022   Cataracts, both eyes 12/08/2022   Encounter for general adult medical examination with abnormal findings 12/08/2022   Type 2 diabetes mellitus with ophthalmic complication 08/15/2017   Essential hypertension 07/10/2017   Hyperlipidemia 07/10/2017   Cigarette nicotine dependence without complication 07/10/2017    ONSET DATE: 01/18/23  REFERRING DIAG: R [place] CVA  THERAPY DIAG:  Other lack of coordination  Other symptoms and signs involving the nervous system  Rationale for Evaluation and Treatment: Rehabilitation  SUBJECTIVE:   SUBJECTIVE STATEMENT: "My arms sore" Pt  accompanied by: Self  PERTINENT HISTORY: Patient states that everything on the L LE went to a "limp" since he had the stroke in January 2024. Also reports of numbness on the LEs. Patient was admitted to the hospital when he had a stroke on 01/18/2023.  PRECAUTIONS: None  WEIGHT BEARING RESTRICTIONS: No  PAIN:  Are you having pain? No  FALLS: Has patient fallen in last 6 months? No  PATIENT GOALS: To get strength back in my L side.   OBJECTIVE:   HAND DOMINANCE: Right  ADLs: Overall ADLs: Pt reports difficulty getting his clothes on, states he gets mixed up with it at times. Additionally due to strength and endurance he has a difficult time cooking and cleaning. Pt's partner assists with medication management.   MOBILITY STATUS: Independent  POSTURE COMMENTS:  No Significant postural limitations Sitting balance: Moves/returns truncal midpoint >2 inches in all planes  ACTIVITY TOLERANCE: Activity tolerance: Pt reports that his endurance has been affected, where he has difficulty sustaining activities due to getting out of breath and weak.   FUNCTIONAL OUTCOME MEASURES: Quick Dash: 45.45 03/15/23: 36.36  UPPER EXTREMITY ROM:    Active ROM Left eval Left 03/15/23  Shoulder flexion 132 122  Shoulder abduction 87 100  Shoulder internal rotation 90 90  Shoulder external rotation 45 72  Elbow flexion 69 139  Elbow extension -3 -3  Wrist flexion 30 75  Wrist extension -2 12  Wrist ulnar deviation  28 49  Wrist radial deviation -5 2  Wrist pronation [facility]  Wrist supination 45 80  (Blank rows = not tested)  UPPER EXTREMITY MMT:     MMT Left eval Left 03/15/23  Shoulder flexion 4/5 4+/5  Shoulder abduction 4-/5 4+/5  Shoulder adduction 4/5 4+/5  Shoulder extension 4+/5 5/5  Shoulder internal rotation 4-/5 4+/5  Shoulder external rotation 4-/5 4+/5  Elbow flexion 4/5 5/5  Elbow extension 4+/5 5/5  Wrist flexion 4/5 4+/5  Wrist extension 3+/5 4/5  Wrist ulnar  deviation 4/5 4+/5  Wrist radial deviation 3/5 4-/5  Wrist pronation 5/5 5/5  Wrist supination 4+/5 5/5  (Blank rows = not tested)  HAND FUNCTION: Grip strength: Right: 70 lbs; Left: 20 lbs, Lateral pinch: Right: 20 lbs, Left: 3 lbs, and 3 point pinch: Right: 10 lbs, Left: 3 lbs Grip strength: 03/15/23; Left: 26 lbs, Lateral pinch: Left: 12 lbs, and 3 point pinch: Left: 6 lbs  COORDINATION: 9 Hole Peg test: Right: 33.74 sec; Left: 46.55 sec 9 Hole Peg test: 03/15/23; Left: 46.09 sec  SENSATION: Light touch: Impaired   EDEMA: Mild edema noted in the L hand  COGNITION: Overall cognitive status: Within functional limits for tasks assessed  VISION: Subjective report: Pt reports he has severe cataracts in both eyes. Was supposed to have surgery, but it got postponed.  Baseline vision: Wears glasses all the time Visual history: cataracts  VISION ASSESSMENT: Not tested  Patient has difficulty with following activities due to following visual impairments: Driving, reading  PERCEPTION: WFL  PRAXIS: WFL  OBSERVATIONS: At times pt demonstrates some word finding difficulties.    TODAY'S TREATMENT:                                                                                                                              DATE:   04/10/23 -Shoulder Strengthening: 4lb dumbbell, flexion, abduction, protraction, horizontal abduction, er/IR, x15 -Functional Reaching: 2lb wrist weights, 10 pegs to top shelf -Therapy Ball Strengthening: yellow weighted ball, chest press, flexion, V ups, circles both directions, x10 -Screws: in hand translation stacking on their heads, Large/Long x5, Large/Medium x5, Large/Short x5, Medium/Medium x5, Small/Medium x5, Small/Short x5 -Coins: holding 10 and flipping them on the table, picking up 10 pennies 1 at a time and placing them in the piggy bank -Pinch Strengthening: green, blue, and black resistance clips, tripod up and lateral pinch down, x5  each  04/04/23 -scapular strengthening: red theraband, extension, retraction, rows, x10 -Shoulder Strengthening: 4lb dumbbell, flexion, abduction, protraction, horizontal abduction, er/IR, x10 -Wrist with red weighted ball: flexion/extension, ulnar/radial deviation, supination/pronation, x10 -Gripper: 35lbs, 10 medium beads in vertical, 5 small beads in horizontal -digiflex: 7lbs, full squeezes x10, each finger squeeze x6 -Grooved peg board, 24 pegs  03/20/23 -Pinch Strengthening: red, green, blue, black resistance clips, tripod pinch up, lateral pinch down x5 each -Shoulder Strengthening: 3lb dumbbell, flexion, abduction, protraction, horizontal abduction, er/IR, x10 -Nuts and Bolts: 2  large bolts, 2 medium, 2 small, and 2 tiny bolts, unscrewing and screwing back -sponges: pinch and hold 23 sponges -Pinch and stack 6 cubes using green resistance clips   PATIENT EDUCATION: Education details: Therapy ball exercises Person educated: Patient and Spouse Education method: Explanation Education comprehension: verbalized understanding  HOME EXERCISE PROGRAM: 2/19: Weight bearing (reaching, weight shifting) 2/26: Coins and card tasks 2/28: Wrist Strengthening 3/4: Shoulder Strengthening 3/13: Theraputty 4/9: scapular strengthening 4/15: Therapy ball Exercises   GOALS: Goals reviewed with patient? Yes  SHORT TERM GOALS: Target date: 03/17/23  Pt will be provided with and educated on HEP to improve mobility in LUE required for ADL completion.  Goal status: IN PROGRESS  2.  Pt will improve strength to 4+/5 in order to improve ability to perform lifting tasks during meal preparation and cleaning tasks.  Goal status: IN PROGRESS  3.  Pt will increase left grip strength by 10# and pinch strength by 3# to improve ability to grasp and hold pots and pans during simple meal preparation.  Goal status: IN PROGRESS  4.  Pt will increase left hand coordination by completing 9 hole peg test in  under 35 seconds, improving ability to manipulate lids and tops on jars, bottles, etc.  Goal status: IN PROGRESS  5.  Pt will improve LUE motor planning by completing functional reaching tasks with minimal compensatory assist from scapular and trapezius regions.  Goal status: MET  6. Pt will increase A/ROM of LUE by 40 degrees in all directions in order to reach overhead and behind back during dressing and bathing tasks.   Goal status: IN PROGRESS   ASSESSMENT:  CLINICAL IMPRESSION: This session pt continuing to work on overall strengthening and motor planning. He is improving with his overall strength, especially in the shoulders. Additionally his fine motor skills/motor planning is improving greatly as he was able to manipulate multiple different sized screws, and coins in a piggy bank with in hand translation. Throughout the session OT is providing verbal and tactile cuing for positioning, technique, and attention for tasks. Additionally pt requiring frequent encouragement to participate and complete tasks without taking rest breaks.    PERFORMANCE DEFICITS: in functional skills including ADLs, IADLs, coordination, sensation, ROM, strength, muscle spasms, Fine motor control, Gross motor control, body mechanics, vision, and UE functional use, cognitive skills including attention, memory, problem solving, and safety awareness.   PLAN:  OT FREQUENCY: 1x/week  OT DURATION: 4 weeks  PLANNED INTERVENTIONS: self care/ADL training, therapeutic exercise, therapeutic activity, neuromuscular re-education, passive range of motion, functional mobility training, electrical stimulation, ultrasound, paraffin, moist heat, cryotherapy, patient/family education, cognitive remediation/compensation, energy conservation, and DME and/or AE instructions  RECOMMENDED OTHER SERVICES: PT and Speech for Cognition  CONSULTED AND AGREED WITH PLAN OF CARE: Patient  PLAN FOR NEXT SESSION: Re-assessment   Trish Mage, OTR/L [facility] [phone] Yadriel Kerrigan Rosemarie Beath, OT 04/10/2023, 1:50 PM

## 2023-04-11 ENCOUNTER — Encounter (HOSPITAL_COMMUNITY): Payer: Self-pay

## 2023-04-11 ENCOUNTER — Ambulatory Visit (HOSPITAL_COMMUNITY)
Admission: RE | Admit: 2023-04-11 | Discharge: 2023-04-11 | Disposition: A | Discharge status: Home / Self Care | Payer: Medicaid Other | Source: Ambulatory Visit | Attending: Otolaryngology | Admitting: Otolaryngology

## 2023-04-11 DIAGNOSIS — R591 Generalized enlarged lymph nodes: Secondary | ICD-10-CM

## 2023-04-11 DIAGNOSIS — Z7689 Persons encountering health services in other specified circumstances: Secondary | ICD-10-CM | POA: Diagnosis not present

## 2023-04-12 ENCOUNTER — Ambulatory Visit (HOSPITAL_COMMUNITY): Payer: Medicaid Other

## 2023-04-12 ENCOUNTER — Telehealth: Payer: Self-pay | Admitting: Internal Medicine

## 2023-04-12 NOTE — Telephone Encounter (Signed)
Patient retuning call, call back # [phone].

## 2023-04-12 NOTE — Telephone Encounter (Signed)
Called in pt seems to be getting the ' run around' in regard to spot that has been found. No one is returning calls from other offices. Wants a a call back.

## 2023-04-12 NOTE — Telephone Encounter (Signed)
Spoke with pts family about this and let them know that Dr Ernestene Kiel office will be reaching out to them. I also called Dr Erskine Speed office as well.

## 2023-04-12 NOTE — Telephone Encounter (Signed)
No answer

## 2023-04-13 DIAGNOSIS — Z7689 Persons encountering health services in other specified circumstances: Secondary | ICD-10-CM | POA: Diagnosis not present

## 2023-04-14 ENCOUNTER — Ambulatory Visit (HOSPITAL_COMMUNITY): Payer: Self-pay

## 2023-04-17 ENCOUNTER — Telehealth (HOSPITAL_COMMUNITY): Payer: Self-pay | Admitting: Otolaryngology

## 2023-04-17 ENCOUNTER — Encounter (HOSPITAL_COMMUNITY): Payer: Self-pay | Admitting: Occupational Therapy

## 2023-04-17 ENCOUNTER — Ambulatory Visit (HOSPITAL_COMMUNITY): Payer: Medicaid Other | Admitting: Occupational Therapy

## 2023-04-17 DIAGNOSIS — R29898 Other symptoms and signs involving the musculoskeletal system: Secondary | ICD-10-CM | POA: Diagnosis not present

## 2023-04-17 DIAGNOSIS — R29818 Other symptoms and signs involving the nervous system: Secondary | ICD-10-CM

## 2023-04-17 DIAGNOSIS — R262 Difficulty in walking, not elsewhere classified: Secondary | ICD-10-CM | POA: Diagnosis not present

## 2023-04-17 DIAGNOSIS — R278 Other lack of coordination: Secondary | ICD-10-CM | POA: Diagnosis not present

## 2023-04-17 DIAGNOSIS — Z7689 Persons encountering health services in other specified circumstances: Secondary | ICD-10-CM | POA: Diagnosis not present

## 2023-04-17 DIAGNOSIS — I69354 Hemiplegia and hemiparesis following cerebral infarction affecting left non-dominant side: Secondary | ICD-10-CM | POA: Diagnosis not present

## 2023-04-17 NOTE — Therapy (Unsigned)
OUTPATIENT OCCUPATIONAL THERAPY NEURO TREATMENT NOTE  AND DISCHARGE NOTE   Patient Name: Robert Lyons MRN: [id number] DOB:1961/04/17, 63 y.o., male Today's Date: 04/17/2023  PCP: Billie Lade, MD REFERRING PROVIDER: Shon Hale, MD  OCCUPATIONAL THERAPY DISCHARGE SUMMARY  Visits from Start of Care: 13  Current functional level related to goals / functional outcomes: Pt has met 6 out of 6 OT goals. He has been provided a comprehensive HEP with consistent follow up for proper technique. His ROM is WFL, as well as demonstrating a full functional reach for completing ADL's with minimal compensatory techniques. Overall his arm strength is 4+/5 or better and his grip and pinch strength have improved over 15# for grip and 4# for pinch.    Remaining deficits: Pt has no further skilled OT concerns at this time. All Goals have been met.    Education / Equipment: Pt was provided a comprehensive HEP and theraband's to continue progressing strength and mobility.    Plan: Patient agrees to discharge, as goals were met and function is improving.         END OF SESSION:  OT End of Session - 04/17/23 1420     Visit Number 13    Number of Visits 13    Date for OT Re-Evaluation 04/14/23    Authorization Type Managed medicaid    Authorization Time Period 4 visits (04/04/23-05/10/23)    Authorization - Visit Number 3    Authorization - Number of Visits 4    OT Start Time 1420    OT Stop Time 1500    OT Time Calculation (min) 40 min    Activity Tolerance Patient tolerated treatment well    Behavior During Therapy WFL for tasks assessed/performed            Past Medical History:  Diagnosis Date   GERD (gastroesophageal reflux disease)    Hypertension    Pneumonia    Stroke    Type 2 diabetes mellitus    Past Surgical History:  Procedure Laterality Date   No prior surgery     Patient Active Problem List   Diagnosis Date Noted   Paratracheal lymphadenopathy  01/31/2023   Acute CVA (cerebrovascular accident) 01/18/2023   Abdominal pain 01/18/2023   Chest pain 01/18/2023   Tobacco abuse 01/18/2023   Hypokalemia 01/18/2023   Vitamin D deficiency 01/05/2023   GERD (gastroesophageal reflux disease) 12/08/2022   Cataracts, both eyes 12/08/2022   Encounter for general adult medical examination with abnormal findings 12/08/2022   Type 2 diabetes mellitus with ophthalmic complication 08/15/2017   Essential hypertension 07/10/2017   Hyperlipidemia 07/10/2017   Cigarette nicotine dependence without complication 07/10/2017    ONSET DATE: 01/18/23  REFERRING DIAG: R [place]ine CVA  THERAPY DIAG:  Other lack of coordination  Other symptoms and signs involving the nervous system  Rationale for Evaluation and Treatment: Rehabilitation  SUBJECTIVE:   SUBJECTIVE STATEMENT: "I'm weak." Pt accompanied by: Self  PERTINENT HISTORY: Patient states that everything on the L LE went to a "limp" since he had the stroke in January 2024. Also reports of numbness on the LEs. Patient was admitted to the hospital when he had a stroke on 01/18/2023.  PRECAUTIONS: None  WEIGHT BEARING RESTRICTIONS: No  PAIN:  Are you having pain? No  FALLS: Has patient fallen in last 6 months? No  PATIENT GOALS: To get strength back in my L side.   OBJECTIVE:   HAND DOMINANCE: Right  ADLs: Overall ADLs: Pt reports difficulty  getting his clothes on, states he gets mixed up with it at times. Additionally due to strength and endurance he has a difficult time cooking and cleaning. Pt's partner assists with medication management.   MOBILITY STATUS: Independent  POSTURE COMMENTS:  No Significant postural limitations Sitting balance: Moves/returns truncal midpoint >2 inches in all planes  ACTIVITY TOLERANCE: Activity tolerance: Pt reports that his endurance has been affected, where he has difficulty sustaining activities due to getting out of breath and weak.    FUNCTIONAL OUTCOME MEASURES: Quick Dash: 45.45 03/15/23: 36.36 04/17/23: 27.27  UPPER EXTREMITY ROM:    Active ROM Left eval Left 03/15/23 Left 04/17/23  Shoulder flexion 132 122 137  Shoulder abduction 87 100 150  Shoulder internal rotation 90 90 90  Shoulder external rotation 45 72 71  Elbow flexion 69 139 139  Elbow extension -3 -3 -3  Wrist flexion 30 75 70  Wrist extension -2 12 38  Wrist ulnar deviation 28 49 50  Wrist radial deviation -5 2 10   Wrist pronation [facility] WFL  Wrist supination 45 80 85  (Blank rows = not tested)  UPPER EXTREMITY MMT:     MMT Left eval Left 03/15/23 Left 04/17/23  Shoulder flexion 4/5 4+/5 5/5  Shoulder abduction 4-/5 4+/5 4+/5  Shoulder adduction 4/5 4+/5 5/5  Shoulder extension 4+/5 5/5 5/5  Shoulder internal rotation 4-/5 4+/5 5/5  Shoulder external rotation 4-/5 4+/5 5/5  Elbow flexion 4/5 5/5 5/5  Elbow extension 4+/5 5/5 5/5  Wrist flexion 4/5 4+/5 5/5  Wrist extension 3+/5 4/5 4+/5  Wrist ulnar deviation 4/5 4+/5 5/5  Wrist radial deviation 3/5 4-/5 4+/5  Wrist pronation 5/5 5/5 5/5  Wrist supination 4+/5 5/5 5/5  (Blank rows = not tested)  HAND FUNCTION: Grip strength: Right: 70 lbs; Left: 20 lbs, Lateral pinch: Right: 20 lbs, Left: 3 lbs, and 3 point pinch: Right: 10 lbs, Left: 3 lbs Grip strength: 03/15/23; Left: 26 lbs, Lateral pinch: Left: 12 lbs, and 3 point pinch: Left: 6 lbs Grip strength: 04/17/23; Left: 38 lbs, Lateral pinch: Left: 12 lbs, and 3 point pinch: Left: 8 lbs  COORDINATION: 9 Hole Peg test: Right: 33.74 sec; Left: 46.55 sec 9 Hole Peg test: 03/15/23; Left: 46.09 sec 9 Hole Peg test: 04/17/23; Left: 33.70 sec  SENSATION: Light touch: Impaired   EDEMA: Mild edema noted in the L hand  COGNITION: Overall cognitive status: Within functional limits for tasks assessed  VISION: Subjective report: Pt reports he has severe cataracts in both eyes. Was supposed to have surgery, but it got postponed.   Baseline vision: Wears glasses all the time Visual history: cataracts  VISION ASSESSMENT: Not tested  Patient has difficulty with following activities due to following visual impairments: Driving, reading  PERCEPTION: WFL  PRAXIS: WFL  OBSERVATIONS: At times pt demonstrates some word finding difficulties.    TODAY'S TREATMENT:  DATE:   04/17/23 -A/ROM: shoulder flexion, abduction, protraction, horizontal abduction, er/IR, x10 -Wrist A/ROM: flexion, extension, ulnar/radial deviation, supination/pronation, x10 -Shoulder Strengthening: 4lb dumbbell, flexion, abduction, protraction, horizontal abduction, er/IR, x15 -Wrist Strengthening: 3lb dumbbell, flexion, extension, ulnar/radial deviation, supination/pronation, x15 -Ball on the Wall, ABC's -Measurements for reassessment  04/10/23 -Shoulder Strengthening: 4lb dumbbell, flexion, abduction, protraction, horizontal abduction, er/IR, x15 -Functional Reaching: 2lb wrist weights, 10 pegs to top shelf -Therapy Ball Strengthening: yellow weighted ball, chest press, flexion, V ups, circles both directions, x10 -Screws: in hand translation stacking on their heads, Large/Long x5, Large/Medium x5, Large/Short x5, Medium/Medium x5, Small/Medium x5, Small/Short x5 -Coins: holding 10 and flipping them on the table, picking up 10 pennies 1 at a time and placing them in the piggy bank -Pinch Strengthening: green, blue, and black resistance clips, tripod up and lateral pinch down, x5 each  04/04/23 -scapular strengthening: red theraband, extension, retraction, rows, x10 -Shoulder Strengthening: 4lb dumbbell, flexion, abduction, protraction, horizontal abduction, er/IR, x10 -Wrist with red weighted ball: flexion/extension, ulnar/radial deviation, supination/pronation, x10 -Gripper: 35lbs, 10 medium beads in vertical, 5 small  beads in horizontal -digiflex: 7lbs, full squeezes x10, each finger squeeze x6 -Grooved peg board, 24 pegs    PATIENT EDUCATION: Education details: Reviewed HEP Person educated: Patient and Spouse Education method: Explanation Education comprehension: verbalized understanding  HOME EXERCISE PROGRAM: 2/19: Weight bearing (reaching, weight shifting) 2/26: Coins and card tasks 2/28: Wrist Strengthening 3/4: Shoulder Strengthening 3/13: Theraputty 4/9: scapular strengthening 4/15: Therapy ball Exercises   GOALS: Goals reviewed with patient? Yes  SHORT TERM GOALS: Target date: 03/17/23  Pt will be provided with and educated on HEP to improve mobility in LUE required for ADL completion.  Goal status: MET  2.  Pt will improve strength to 4+/5 in order to improve ability to perform lifting tasks during meal preparation and cleaning tasks.  Goal status: MET  3.  Pt will increase left grip strength by 10# and pinch strength by 3# to improve ability to grasp and hold pots and pans during simple meal preparation.  Goal status: MET  4.  Pt will increase left hand coordination by completing 9 hole peg test in under 35 seconds, improving ability to manipulate lids and tops on jars, bottles, etc.  Goal status: MET  5.  Pt will improve LUE motor planning by completing functional reaching tasks with minimal compensatory assist from scapular and trapezius regions.  Goal status: MET  6. Pt will increase A/ROM of LUE by 40 degrees in all directions in order to reach overhead and behind back during dressing and bathing tasks.   Goal status: MET   ASSESSMENT:  CLINICAL IMPRESSION: Pt was reassessed this session where he demonstrated good ROM, strength, and coordination. He continues to report that he does not follow through with his UE HEP much at home, besides "playing with" the theraputty. This session, OT and pt reviewed HEP for continued improvement with ROM and strength for independent  mobility at home. He has no further skilled OT needs at this time and agrees to be discharged from OT.    PERFORMANCE DEFICITS: in functional skills including ADLs, IADLs, coordination, sensation, ROM, strength, muscle spasms, Fine motor control, Gross motor control, body mechanics, vision, and UE functional use, cognitive skills including attention, memory, problem solving, and safety awareness.   PLAN:  OT FREQUENCY: 1x/week  OT DURATION: 4 weeks  PLANNED INTERVENTIONS: self care/ADL training, therapeutic exercise, therapeutic activity, neuromuscular re-education, passive range of motion, functional mobility training, electrical  stimulation, ultrasound, paraffin, moist heat, cryotherapy, patient/family education, cognitive remediation/compensation, energy conservation, and DME and/or AE instructions  RECOMMENDED OTHER SERVICES: PT and Speech for Cognition  CONSULTED AND AGREED WITH PLAN OF CARE: Patient  PLAN FOR NEXT SESSION: Discharge   Brandey Vandalen Bing Plume, OTR/L [facility] [phone] Jamere Stidham Rosemarie Beath, OT 04/17/2023, 2:21 PM

## 2023-04-17 NOTE — Telephone Encounter (Signed)
04/17/23~LVM ref STAT CT appt info. MF

## 2023-04-17 NOTE — Telephone Encounter (Signed)
04/17/23~LVM ref STAT CT Parathyroid / Unable to leave VM due to VM Full. MF

## 2023-04-18 ENCOUNTER — Ambulatory Visit (HOSPITAL_COMMUNITY): Payer: Medicaid Other

## 2023-04-19 ENCOUNTER — Ambulatory Visit (HOSPITAL_COMMUNITY): Payer: Medicaid Other

## 2023-04-25 ENCOUNTER — Encounter (HOSPITAL_COMMUNITY): Payer: Self-pay | Admitting: Occupational Therapy

## 2023-04-25 DIAGNOSIS — Z7689 Persons encountering health services in other specified circumstances: Secondary | ICD-10-CM | POA: Diagnosis not present

## 2023-04-26 ENCOUNTER — Ambulatory Visit (HOSPITAL_COMMUNITY): Payer: Medicaid Other | Attending: Family Medicine

## 2023-04-26 DIAGNOSIS — I69354 Hemiplegia and hemiparesis following cerebral infarction affecting left non-dominant side: Secondary | ICD-10-CM | POA: Diagnosis not present

## 2023-04-26 DIAGNOSIS — R29898 Other symptoms and signs involving the musculoskeletal system: Secondary | ICD-10-CM | POA: Insufficient documentation

## 2023-04-26 DIAGNOSIS — R278 Other lack of coordination: Secondary | ICD-10-CM | POA: Diagnosis not present

## 2023-04-26 DIAGNOSIS — R262 Difficulty in walking, not elsewhere classified: Secondary | ICD-10-CM | POA: Insufficient documentation

## 2023-04-26 DIAGNOSIS — Z419 Encounter for procedure for purposes other than remedying health state, unspecified: Secondary | ICD-10-CM | POA: Diagnosis not present

## 2023-04-26 DIAGNOSIS — Z7689 Persons encountering health services in other specified circumstances: Secondary | ICD-10-CM | POA: Diagnosis not present

## 2023-04-26 DIAGNOSIS — R29818 Other symptoms and signs involving the nervous system: Secondary | ICD-10-CM | POA: Diagnosis not present

## 2023-04-26 NOTE — Therapy (Signed)
OUTPATIENT PHYSICAL THERAPY DISCHARGE/PROGRESS NOTE  PHYSICAL THERAPY DISCHARGE SUMMARY  Visits from Start of Care: 12  Current functional level related to goals / functional outcomes: See below   Remaining deficits: See below   Education / Equipment: See below   Patient agrees to discharge. Patient goals were partially met. Patient is being discharged due to the patient's request. As he is having increased pain and wants to pursue what is causing that.     Progress Note Reporting Period 02/10/2023 to 04/26/2023  See note below for Objective Data and Assessment of Progress/Goals.     Patient Name: Robert Lyons MRN: [id number] DOB:21-Apr-1961, 62 y.o., male Today's Date: 04/26/2023  PCP: Billie Lade MD  REFERRING PROVIDER: Shon Hale, MD  END OF SESSION:   PT End of Session - 04/26/23 1120     Visit Number 12    Number of Visits 18    Date for PT Re-Evaluation 04/19/23    Authorization Type [place] [employer]    Authorization Time Period 12 visits approved 2/16-4/16; requested additonal visits 04/26/2023    Authorization - Number of Visits 12    Progress Note Due on Visit 8    PT Start Time 1121    PT Stop Time 1200    PT Time Calculation (min) 39 min    Equipment Utilized During Treatment Gait belt    Activity Tolerance Patient tolerated treatment well    Behavior During Therapy WFL for tasks assessed/performed              Past Medical History:  Diagnosis Date   GERD (gastroesophageal reflux disease)    Hypertension    Pneumonia    Stroke (HCC)    Type 2 diabetes mellitus (HCC)    Past Surgical History:  Procedure Laterality Date   No prior surgery     Patient Active Problem List   Diagnosis Date Noted   Paratracheal lymphadenopathy 01/31/2023   Acute CVA (cerebrovascular accident) (HCC) 01/18/2023   Abdominal pain 01/18/2023   Chest pain 01/18/2023   Tobacco abuse 01/18/2023   Hypokalemia 01/18/2023   Vitamin D deficiency  01/05/2023   GERD (gastroesophageal reflux disease) 12/08/2022   Cataracts, both eyes 12/08/2022   Encounter for general adult medical examination with abnormal findings 12/08/2022   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HCC) 08/15/2017   Essential hypertension 07/10/2017   Hyperlipidemia 07/10/2017   Cigarette nicotine dependence without complication 07/10/2017    ONSET DATE: 01/18/2023  REFERRING DIAG: I63.9 (ICD-10-CM) - Acute CVA (cerebrovascular accident) (HCC)  THERAPY DIAG:  Other lack of coordination  Other symptoms and signs involving the nervous system  Difficulty walking  Weakness of left lower extremity  Hemiplegia and hemiparesis following cerebral infarction affecting left non-dominant side (HCC)  Rationale for Evaluation and Treatment: Rehabilitation  SUBJECTIVE:  SUBJECTIVE STATEMENT: Patient reports mid back pain today; states it is ongoing and he has tried to talk to his MD about it without success. 2-10/10 pain; varies throughout the day; sometimes left leg hurts too.  Lost his cane.  Reports this pain has come on over the last month and is getting worse.  Reports he is losing weight; taking Ozempic per Dr. Durwin Nora.      Evaluation: Patient states that everything on the L LE went to a "limp" since he had the stroke in January 2024. Also reports of numbness on the LEs. Patient was admitted to the hospital when he had a stroke in 01/18/2023 and was D/C the other night. Denies getting rehabilitation services while at the hospital but he was immediately referred to outpatient PT evaluation and management. Patient is also receiving outpatient OT services at this time.  Pt accompanied by: significant other  PERTINENT HISTORY: DM, HTN  PAIN:  Are you having pain?  No  PRECAUTIONS: None  WEIGHT BEARING RESTRICTIONS: No  FALLS: Has patient fallen in last 6 months? No  LIVING ENVIRONMENT: Lives with:  significant other Lives in: House/apartment Stairs: No Has following equipment at home: Single point cane and shower chair  PLOF: Independent  PATIENT GOALS: "to get my strength back"  OBJECTIVE:   DIAGNOSTIC FINDINGS:  MRI Brain without contrast 01/18/2023 IMPRESSION: 1. 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 2. No intracranial large vessel occlusion or significant stenosis.  COGNITION: Overall cognitive status: Within functional limits for tasks assessed   SENSATION: Patient reports that the L LE has can feel "more" than the R as to light touch  COORDINATION: Moderately impaired on the L LE as to heel-to-shin No difficulty with alternating seated foot taps  MUSCLE LENGTH: Moderate tightness on B hamstrings and gastrocnemius  POSTURE: rounded shoulders, forward head, and L knee slightly flexed in standing  LOWER EXTREMITY ROM:     Active  Right Eval & 03/22/23 Left Eval & 03/22/23  Hip flexion [facility]  Hip extension [facility]  Hip abduction [facility]  Hip adduction    Hip internal rotation    Hip external rotation    Knee flexion [facility]  Knee extension [facility]  Ankle dorsiflexion [facility]  Ankle plantarflexion [facility] WFL  Ankle inversion    Ankle eversion     (Blank rows = not tested)  LOWER EXTREMITY MMT:    MMT Right Eval Left Eval Right 03/22/23 Left 03/22/23 Right 04/26/23 Left 04/26/23  Hip flexion 5 3+ 4+ 3+ 4+ 4  Hip extension 4 3+ 4 3+    Hip abduction 4+ 3+ 4 3+    Hip adduction        Hip internal rotation        Hip external rotation        Knee flexion 5 4- 5 4-    Knee extension 4 3+ 4+ 3+ 5 4  Ankle dorsiflexion 5 3+ 5 3+ 5 4+  Ankle plantarflexion 5 3+ 5 4-    Ankle inversion        Ankle eversion        (Blank rows = not tested)  BED MOBILITY: (not assessed on 03/22/23) Sit to supine  Complete Independence Supine to sit Complete Independence Rolling to Right Complete Independence Rolling to Left Complete Independence  TRANSFERS: Assistive device utilized: None  Sit to stand: SBA Stand to sit: SBA  GAIT: Gait pattern: decreased step length- Right, decreased stance  time- Left, decreased stride length, decreased hip/knee flexion- Left, decreased ankle dorsiflexion- Left, Left hip hike, knee flexed in stance- Left, lateral lean- Right, narrow BOS, poor foot clearance- Right, and poor foot clearance- Left.  Distance walked: 207 ft Assistive device utilized: Single point cane Level of assistance: SBA from CGA Comments: mildly unsteady from moderately unsteady  FUNCTIONAL TESTS:  5 times sit to stand: 16.28 sec from 21.53 sec Timed up and go (TUG): 16.20 sec from 22.83 sec 2 minute walk test:  332 ft (with SPC) from 207 ft  TODAY'S TREATMENT:                                                                                                                              DATE:  04/26/23 Progress note 5 times sit to stand 25.28 sec using hands on thighs to push up to stand TUG 21.26 sec; 23.31 sec no AD unable secondary to pain MMT's see above  Bp 132/70 right arm  Scapular retractions x 5 Good sitting posture education  04/06/23:  Standing:  Squats front of chair- cueing for mechanics SLS 3 times both directions Vector stance 5x 5" with 1 UE A Tandem stance 3x 30" Tandem gait 2RT 7in step height reciprocal pattern 1 HR x 5 [place] 3Pl 5x retro and sidestep both directions Lunges onto 6in step height with no UE A 15x Toe tapping 6in, attempted with UE flexion, increased difficutly with coordination  03/22/2023 Progress Note ( , TUG, 5TSTS, ROM, MMT) Nustep level 4, 5 minutes, LE only, seat 10, 80-90 SPM Standing:  Gastrocnemius slant board stretch x 30" x 3 Cone taps with L heel, UE support x 10 x 2 Stepping over 4" hurdles x 5 rounds with UE  support Stepping over 4" hurdles x 2 rounds without UE support Seated: Sit-to-stand on a standard chair, 5 reps, LE's equal no UE assist  03/20/2023 Nustep level 3, 5 minutes, LE only, seat 10, 80-90 SPM at EOS Standing: heel and toe raises 15X Gastrocnemius slant board stretch x 30" x 3 Lunges onto 4" no UE 2X10 each 6" lateral step ups 2X10 each 1 UE 6" forward step ups 2X10 each 1 UE SLS 15" X 3 each LE Tandem stance 30" X 2 each LE Vectors 10X5" each LE with 1 UE assist Stairs 7" 5RT with 1 HR reciprocally Seated: Sit-to-stand on a standard chair, LE's equal no UE assist  03/15/2023 Nustep level 3, 5 minutes, LE only, seat 10, 80-90 SPM Standing: heel and toe raises 15X (difficulty with toe raise) Gastrocnemius slant board stretch x 30" x 3 Lunges onto 4" no UE 2X10 each 6" lateral step ups 2X10 each 1 UE 6" forward step ups 2X10 each 1 UE SLS 15" X 3 each LE Tandem stance 30" X 2 each LE Vectors 5X5" each LE with 1 UE assist Stairs 7" 5RT with 1 HR reciprocally Seated: Sit-to-stand on a standard chair, LE's equal no UE  assist  03/13/2023 Nustep level 3, 5 minutes, LE only, seat 10, 80-90 SPM Standing: Gastrocnemius slant board stretch x 30" x 3 Lunges onto 4" no UE 2X10 each 4" lateral step ups 2X10 each 1 UE 4" forward step ups 2X10 each 1 UE Seated: Sit-to-stand on a standard chair, LLE slightly behind, with UE assist x 5  03/08/2023 Nustep level 3, 5 minutes, LE only, seat 10, 80-90 SPM Gastrocnemius slant board stretch x 30" x 3 Seated hamstring stretch x 30" x 3 Sit-to-stand on a standard chair, LLE slightly behind, with UE assist x 5 Sit-to-stand slightly elevated seat, B LE slightly behind x 5, no UE assist Standing L TKE, RTB x 3" x 10 x 2 Walking backwards x 40 ft x 2 rounds, CGA Forward stepping over cone obstacles ~ 5 ft x 2 rounds, CGA Side stepping over cone obstacles ~ 5 ft x 2 rounds, CGA   03/06/2023 Nustep level 2 5 minutes UE/LE seat  10  Standing: Heel raises x 20 Marching 3# x 20 Hip abduction 3# x 20 Hip extension 3# x 20 4" box step ups x 10 each 4" box lateral step ups x 10 Tandem stance 2 x 30"  Sit to stand x 10 no UE assist  02/27/2023 Nustep level 2 5 minutes UE/LE seat 10 Standing:  heelraise 20X  Hip abduction 20X each 2#  Hip extension 20X each 2#  Marching alternating with 5" holds 10X each 2#  4" step up laterals 20X each  4" lunge with 1 UE assist 20X each  Tandem stance 30" each lead  Vector 10X5" with 1 UE assist each LE Sit to stands 10X no UE from standard chair    PATIENT EDUCATION: Education details: Educated on the pathoanatomy of CVA. Educated on the goals and course of rehab. Education on falls reduction/prevention at home. Person educated: Patient and significant other Education method: Explanation Education comprehension: verbalized understanding  HOME EXERCISE PROGRAM: Access Code: [email or web address] URL: [email or web address] Date: 02/13/2023 Prepared by: Emeline Gins Exercises - Seated Long Arc Quad  - 2 x daily - 7 x weekly - 1 sets - 20 reps - 5 sec hold - Sit to Stand  - 2 x daily - 7 x weekly - 1 sets - 10 reps - Standing Hip Abduction  - 2 x daily - 7 x weekly - 1 sets - 20 reps - Standing Hip Extension  - 2 x daily - 7 x weekly - 1 sets - 20 reps - Standing Hip Flexion  - 2 x daily - 7 x weekly - 1 sets - 20 reps - 5 sec hold   GOALS: Goals reviewed with patient? Yes  SHORT TERM GOALS: Target date: 03/11/2023  Pt will demonstrate indep in HEP to facilitate carry-over of skilled services and improve functional outcomes  Goal status: MET  LONG TERM GOALS: Target date: 04/19/2023  Pt will have a decrease in TUG score by at least 3 sec in order to demonstrate clinically significant improvement in community ambulation Baseline: 16.20 sec Goal status: IN PROGRESS  2.  Pt will decrease 5TSTS by at least 3 seconds in order to demonstrate clinically  significant improvement in LE strength  Baseline: 216.28 sec Goal status: IN PROGRESS  3.  Pt will increase by at least 40 ft in order to demonstrate clinically significant improvement in community ambulation  Baseline: 332 ft ft Goal status: IN PROGRESS  4.  Pt will demonstrate increase in LE strength to 4+/5  to facilitate ease and safety in ambulation  Baseline: 3+/5 Goal status: IN PROGRESS  5.  Pt will be able to walk on level surfaces > 100 ft without an assistive device with little to no gait deviation Goal status: IN PROGRESS  ASSESSMENT:  CLINICAL IMPRESSION: Progress note today; patient's chief complaint today is pain; mostly across midback and down left leg. With palpation he is tender to left medial scapular border.  Encouraged him to improve sitting posture as he sits with forward head and rounded shoulders; added scapular retractions to strengthen postural muscles; good improvement with strength but decline in functional testing likely due to increased pain.  Checked BP as he was complaining of left side trunk pain but his BP was normal.  Recommend discharge at this time due to plateau in progress and patient is agreeable as he wants to pursue other medical avenues/testing for his pain   OBJECTIVE IMPAIRMENTS: Abnormal gait, decreased balance, decreased endurance, decreased mobility, difficulty walking, decreased strength, and impaired flexibility.   ACTIVITY LIMITATIONS: lifting, bending, sitting, standing, squatting, stairs, transfers, and locomotion level  PARTICIPATION LIMITATIONS: meal prep, cleaning, laundry, driving, shopping, and community activity  PERSONAL FACTORS: Fitness are also affecting patient's functional outcome.   REHAB POTENTIAL: Good  CLINICAL DECISION MAKING: Stable/uncomplicated  EVALUATION COMPLEXITY: Low  PLAN:  PT FREQUENCY: 2x/week  PT DURATION: 4 weeks  PLANNED INTERVENTIONS: Therapeutic exercises, Therapeutic activity,  Neuromuscular re-education, Balance training, Gait training, Patient/Family education, Self Care, Stair training, and Electrical stimulation  PLAN FOR NEXT SESSION:  discharge  11:57 AM, 04/26/23 Mitchell Epling Small Talita Recht MPT [facility] [place] [phone]

## 2023-04-27 ENCOUNTER — Telehealth: Payer: Self-pay | Admitting: Internal Medicine

## 2023-04-27 NOTE — Telephone Encounter (Signed)
Pt called and states he has an infection in gums,and would like an Antibiotic sent over to pharmacy.

## 2023-04-28 NOTE — Telephone Encounter (Signed)
Called pt and no answer. Unable to leave VM

## 2023-04-29 ENCOUNTER — Other Ambulatory Visit: Payer: Self-pay | Admitting: Internal Medicine

## 2023-04-29 DIAGNOSIS — I10 Essential (primary) hypertension: Secondary | ICD-10-CM

## 2023-05-01 ENCOUNTER — Telehealth: Payer: Self-pay | Admitting: Internal Medicine

## 2023-05-01 NOTE — Telephone Encounter (Signed)
Called pt and no answer. Unable to leave VM

## 2023-05-02 ENCOUNTER — Encounter (HOSPITAL_COMMUNITY): Payer: Self-pay | Admitting: Occupational Therapy

## 2023-05-02 ENCOUNTER — Ambulatory Visit: Payer: Medicaid Other | Admitting: Internal Medicine

## 2023-05-03 ENCOUNTER — Ambulatory Visit (HOSPITAL_COMMUNITY): Payer: Medicaid Other | Admitting: Physical Therapy

## 2023-05-03 DIAGNOSIS — Z7689 Persons encountering health services in other specified circumstances: Secondary | ICD-10-CM | POA: Diagnosis not present

## 2023-05-04 NOTE — Telephone Encounter (Signed)
error 

## 2023-05-07 ENCOUNTER — Ambulatory Visit (HOSPITAL_BASED_OUTPATIENT_CLINIC_OR_DEPARTMENT_OTHER)
Admission: RE | Admit: 2023-05-07 | Discharge: 2023-05-07 | Disposition: A | Discharge status: Home / Self Care | Payer: Medicaid Other | Source: Ambulatory Visit | Attending: Otolaryngology | Admitting: Otolaryngology

## 2023-05-07 DIAGNOSIS — R591 Generalized enlarged lymph nodes: Secondary | ICD-10-CM | POA: Insufficient documentation

## 2023-05-07 DIAGNOSIS — Z7689 Persons encountering health services in other specified circumstances: Secondary | ICD-10-CM | POA: Diagnosis not present

## 2023-05-07 DIAGNOSIS — R221 Localized swelling, mass and lump, neck: Secondary | ICD-10-CM | POA: Diagnosis not present

## 2023-05-07 MED ORDER — IOHEXOL 300 MG/ML  SOLN
100.0000 mL | Freq: Once | INTRAMUSCULAR | Status: AC | PRN
  Administered 2023-05-07: 75 mL via INTRAVENOUS

## 2023-05-10 ENCOUNTER — Other Ambulatory Visit (HOSPITAL_COMMUNITY): Payer: Self-pay | Admitting: Otolaryngology

## 2023-05-10 ENCOUNTER — Ambulatory Visit (HOSPITAL_COMMUNITY): Payer: Medicaid Other

## 2023-05-10 DIAGNOSIS — R590 Localized enlarged lymph nodes: Secondary | ICD-10-CM

## 2023-05-10 DIAGNOSIS — K2289 Other specified disease of esophagus: Secondary | ICD-10-CM

## 2023-05-15 ENCOUNTER — Inpatient Hospital Stay: Payer: Medicaid Other | Admitting: Hematology

## 2023-05-18 ENCOUNTER — Encounter (HOSPITAL_COMMUNITY): Payer: Self-pay

## 2023-05-18 ENCOUNTER — Encounter (HOSPITAL_COMMUNITY)
Admission: RE | Admit: 2023-05-18 | Discharge: 2023-05-18 | Disposition: A | Discharge status: Home / Self Care | Payer: Medicaid Other | Source: Ambulatory Visit | Attending: Otolaryngology | Admitting: Otolaryngology

## 2023-05-18 DIAGNOSIS — K2289 Other specified disease of esophagus: Secondary | ICD-10-CM | POA: Insufficient documentation

## 2023-05-18 DIAGNOSIS — R590 Localized enlarged lymph nodes: Secondary | ICD-10-CM | POA: Insufficient documentation

## 2023-05-18 DIAGNOSIS — Z7689 Persons encountering health services in other specified circumstances: Secondary | ICD-10-CM | POA: Diagnosis not present

## 2023-05-19 ENCOUNTER — Other Ambulatory Visit: Payer: Self-pay

## 2023-05-19 ENCOUNTER — Telehealth: Payer: Self-pay | Admitting: Internal Medicine

## 2023-05-19 MED ORDER — OMEPRAZOLE 40 MG PO CPDR: 40.0000 mg | DELAYED_RELEASE_CAPSULE | Freq: Every day | ORAL | 0 refills | Status: DC

## 2023-05-19 NOTE — Telephone Encounter (Signed)
Refills sent

## 2023-05-19 NOTE — Telephone Encounter (Signed)
Pt called and requesting refills.    omeprazole (PRILOSEC) 40 MG capsule [[id number]]

## 2023-05-24 ENCOUNTER — Inpatient Hospital Stay: Payer: Medicaid Other | Admitting: Oncology

## 2023-05-27 DIAGNOSIS — Z419 Encounter for procedure for purposes other than remedying health state, unspecified: Secondary | ICD-10-CM | POA: Diagnosis not present

## 2023-05-29 ENCOUNTER — Ambulatory Visit (HOSPITAL_COMMUNITY): Payer: Medicaid Other

## 2023-06-01 ENCOUNTER — Encounter (HOSPITAL_COMMUNITY)
Admission: RE | Admit: 2023-06-01 | Discharge: 2023-06-01 | Disposition: A | Discharge status: Home / Self Care | Payer: Medicaid Other | Source: Ambulatory Visit | Attending: Otolaryngology | Admitting: Otolaryngology

## 2023-06-01 DIAGNOSIS — K2289 Other specified disease of esophagus: Secondary | ICD-10-CM | POA: Diagnosis not present

## 2023-06-01 DIAGNOSIS — R590 Localized enlarged lymph nodes: Secondary | ICD-10-CM | POA: Diagnosis not present

## 2023-06-01 DIAGNOSIS — Z7689 Persons encountering health services in other specified circumstances: Secondary | ICD-10-CM | POA: Diagnosis not present

## 2023-06-01 DIAGNOSIS — D110 Benign neoplasm of parotid gland: Secondary | ICD-10-CM | POA: Diagnosis not present

## 2023-06-01 DIAGNOSIS — H25813 Combined forms of age-related cataract, bilateral: Secondary | ICD-10-CM | POA: Diagnosis not present

## 2023-06-01 DIAGNOSIS — H35033 Hypertensive retinopathy, bilateral: Secondary | ICD-10-CM | POA: Diagnosis not present

## 2023-06-01 DIAGNOSIS — E119 Type 2 diabetes mellitus without complications: Secondary | ICD-10-CM | POA: Diagnosis not present

## 2023-06-01 MED ORDER — FLUDEOXYGLUCOSE F - 18 (FDG) INJECTION
10.2800 | Freq: Once | INTRAVENOUS | Status: AC | PRN
  Administered 2023-06-01: 10.28 via INTRAVENOUS

## 2023-06-02 ENCOUNTER — Ambulatory Visit (INDEPENDENT_AMBULATORY_CARE_PROVIDER_SITE_OTHER): Payer: Medicaid Other | Admitting: Internal Medicine

## 2023-06-02 ENCOUNTER — Encounter: Payer: Self-pay | Admitting: Internal Medicine

## 2023-06-02 VITALS — BP 104/64 | HR 94 | Ht 71.0 in | Wt 189.6 lb

## 2023-06-02 DIAGNOSIS — R110 Nausea: Secondary | ICD-10-CM

## 2023-06-02 DIAGNOSIS — L602 Onychogryphosis: Secondary | ICD-10-CM

## 2023-06-02 DIAGNOSIS — R590 Localized enlarged lymph nodes: Secondary | ICD-10-CM | POA: Diagnosis not present

## 2023-06-02 DIAGNOSIS — G6289 Other specified polyneuropathies: Secondary | ICD-10-CM

## 2023-06-02 DIAGNOSIS — E1140 Type 2 diabetes mellitus with diabetic neuropathy, unspecified: Secondary | ICD-10-CM

## 2023-06-02 DIAGNOSIS — Z7689 Persons encountering health services in other specified circumstances: Secondary | ICD-10-CM | POA: Diagnosis not present

## 2023-06-02 DIAGNOSIS — E1136 Type 2 diabetes mellitus with diabetic cataract: Secondary | ICD-10-CM

## 2023-06-02 MED ORDER — OZEMPIC (0.25 OR 0.5 MG/DOSE) 2 MG/3ML ~~LOC~~ SOPN: 0.5000 mg | PEN_INJECTOR | SUBCUTANEOUS | 2 refills | Status: DC

## 2023-06-02 MED ORDER — ONDANSETRON HCL 4 MG PO TABS: 4.0000 mg | ORAL_TABLET | Freq: Three times a day (TID) | ORAL | 0 refills | Status: AC | PRN

## 2023-06-02 MED ORDER — GABAPENTIN 300 MG PO CAPS
300.0000 mg | ORAL_CAPSULE | Freq: Every day | ORAL | 2 refills | Status: DC
Start: 2023-06-02 — End: 2024-01-18

## 2023-06-02 MED ORDER — OZEMPIC (0.25 OR 0.5 MG/DOSE) 2 MG/3ML ~~LOC~~ SOPN: 0.5000 mg | PEN_INJECTOR | SUBCUTANEOUS | 0 refills | Status: DC

## 2023-06-02 NOTE — Progress Notes (Unsigned)
Established Patient Office Visit  Subjective   Patient ID: Robert Lyons, male    DOB: 08-05-1961  Age: 62 y.o. MRN: [id number]  Chief Complaint  Patient presents with   Diabetes    Follow up   Mr. Krumholz returns to care today for 36-month follow-up.  He was last evaluated by me on 3/7.  Ozempic was increased to 0.5 mg weekly at that time.  No additional medication changes were made.  In the interim he has been seen by gastroenterology and participated with PT/OT.  He has also been seen by ENT and has recently completed a PET scan in the setting of peritracheal lymphadenopathy.  The result is pending.  Mr. Bebo relays today that he will undergo surgery on his right eye in July.  He endorses shooting pains in both feet, worse at night.  Occasionally experiences balance issues.  He would like to have his feet examined today and requests a medication for relief of bilateral foot pain.  He states that he stopped taking Ozempic 2 weeks ago due to nausea that lasted 1-2 days after each injection.   Past Medical History:  Diagnosis Date   GERD (gastroesophageal reflux disease)    Hypertension    Pneumonia    Stroke (HCC)    Type 2 diabetes mellitus (HCC)    Past Surgical History:  Procedure Laterality Date   No prior surgery     Social History   Tobacco Use   Smoking status: Former    Packs/day: 1.00    Years: 15.00    Additional pack years: 0.00    Total pack years: 15.00    Types: Cigarettes    Quit date: 01/17/2023    Years since quitting: 0.3   Smokeless tobacco: Never  Vaping Use   Vaping Use: Never used  Substance Use Topics   Alcohol use: No    Comment: none since 2014   Drug use: No   Family History  Problem Relation Age of Onset   Diabetes Mother    Hypertension Mother    Diabetes Maternal Aunt    Diabetes Maternal Uncle    No Known Allergies  Review of Systems  Neurological:  Positive for tingling (Numbness/tingling and burning in both feet, worse  at night).     Objective:     BP 104/64   Pulse 94   Ht 5\' 11"  (1.803 m)   Wt 189 lb 9.6 oz (86 kg)   SpO2 95%   BMI 26.44 kg/m  BP Readings from Last 3 Encounters:  06/06/23 104/66  06/02/23 104/64  03/28/23 126/76   Physical Exam Vitals reviewed.  Constitutional:      General: He is not in acute distress.    Appearance: Normal appearance. He is not ill-appearing.  HENT:     Head: Normocephalic and atraumatic.     Right Ear: External ear normal.     Left Ear: External ear normal.     Nose: Nose normal. No congestion or rhinorrhea.     Mouth/Throat:     Mouth: Mucous membranes are moist.     Pharynx: Oropharynx is clear.  Eyes:     General: No scleral icterus.    Extraocular Movements: Extraocular movements intact.     Conjunctiva/sclera: Conjunctivae normal.     Pupils: Pupils are equal, round, and reactive to light.  Cardiovascular:     Rate and Rhythm: Normal rate and regular rhythm.     Pulses: Normal pulses.  Heart sounds: Normal heart sounds. No murmur heard. Pulmonary:     Effort: Pulmonary effort is normal.     Breath sounds: Normal breath sounds. No wheezing, rhonchi or rales.  Abdominal:     General: Abdomen is flat. Bowel sounds are normal. There is no distension.     Palpations: Abdomen is soft.     Tenderness: There is no abdominal tenderness.  Musculoskeletal:        General: No swelling or deformity. Normal range of motion.     Cervical back: Normal range of motion.  Skin:    General: Skin is warm and dry.     Capillary Refill: Capillary refill takes less than 2 seconds.  Neurological:     General: No focal deficit present.     Mental Status: He is alert and oriented to person, place, and time.     Motor: No weakness.  Psychiatric:        Mood and Affect: Mood normal.        Behavior: Behavior normal.        Thought Content: Thought content normal.    Diabetic foot exam was performed.  Visual Findings: overgrown toenails Posterior  tibialis and dorsalis pulse intact bilaterally.  Intact to touch and monofilament testing bilaterally.    Last CBC Lab Results  Component Value Date   WBC 8.0 01/24/2023   HGB 14.6 01/24/2023   HCT 42.3 01/24/2023   MCV 89.2 01/24/2023   MCH 30.8 01/24/2023   RDW 11.8 01/24/2023   PLT 270 01/24/2023   Last metabolic panel Lab Results  Component Value Date   GLUCOSE 235 (H) 03/28/2023   NA 137 03/28/2023   K 3.5 03/28/2023   CL 102 03/28/2023   CO2 26 03/28/2023   BUN 9 03/28/2023   CREATININE 1.08 03/28/2023   GFRNONAA >60 03/28/2023   CALCIUM 9.5 03/28/2023   PROT 7.1 01/18/2023   ALBUMIN 4.1 01/18/2023   LABGLOB 2.5 12/02/2022   AGRATIO 1.9 12/02/2022   BILITOT 1.0 01/18/2023   ALKPHOS 58 01/18/2023   AST 28 01/18/2023   ALT 19 01/18/2023   ANIONGAP 9 03/28/2023   Last lipids Lab Results  Component Value Date   CHOL 81 01/19/2023   HDL 34 (L) 01/19/2023   LDLCALC 29 01/19/2023   TRIG 89 01/19/2023   CHOLHDL 2.4 01/19/2023   Last hemoglobin A1c Lab Results  Component Value Date   HGBA1C 8.7 (H) 12/02/2022   Last thyroid functions Lab Results  Component Value Date   TSH 1.890 12/02/2022   Last vitamin D Lab Results  Component Value Date   VD25OH 15.4 (L) 12/02/2022   Last vitamin B12 and Folate Lab Results  Component Value Date   VITAMINB12 381 12/02/2022   FOLATE 9.7 12/02/2022     Assessment & Plan:   Problem List Items Addressed This Visit       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HCC) - Primary    A1c 8.7 on labs from December.  He is currently prescribed metformin 850 mg twice daily and Ozempic 0.5 mg weekly.  Ozempic was increased at his last appointment.  He states that he stopped taking Ozempic recently due to nausea for 1-2 days after each injection. -Through shared decision making, we will resume Ozempic today at 0.5 mg weekly.  I have added Zofran for as needed nausea relief.  He will notify us if this is  ineffective in alleviating his symptoms. -Repeat A1c at next follow-up appointment -  Diabetic foot exam completed today.  Podiatry referral placed for management of overgrown toenails.      Peripheral neuropathy    He endorses burning pain and tingling in both feet, worse at night. -Trial gabapentin 300 mg nightly for pain relief      Paratracheal lymphadenopathy    Followed by oncology and ENT.  PET scan completed yesterday (6/6).  Result is pending.      Return in about 3 months (around 09/02/2023).   Billie Lade, MD

## 2023-06-02 NOTE — Patient Instructions (Signed)
It was a pleasure to see you today.  Thank you for giving [place] the opportunity to be involved in your care.  Below is a brief recap of your visit and next steps.  We will plan to see you again in 3 months.  Summary Try gabapentin for relief of foot pain Podiatry referral placed Resume Ozempic, add Zofran for nausea relief. Follow up in 3 months

## 2023-06-06 ENCOUNTER — Inpatient Hospital Stay: Payer: Medicaid Other | Attending: Hematology | Admitting: Oncology

## 2023-06-06 VITALS — BP 104/66 | HR 82 | Temp 97.7°F | Resp 16 | Wt 188.9 lb

## 2023-06-06 DIAGNOSIS — R591 Generalized enlarged lymph nodes: Secondary | ICD-10-CM | POA: Diagnosis not present

## 2023-06-06 DIAGNOSIS — Z7689 Persons encountering health services in other specified circumstances: Secondary | ICD-10-CM | POA: Diagnosis not present

## 2023-06-06 DIAGNOSIS — R590 Localized enlarged lymph nodes: Secondary | ICD-10-CM

## 2023-06-06 NOTE — Telephone Encounter (Signed)
Called to schedule pt for his EGD +/-ED next month with Dr.Carver. Pt says he has a lot going on right now with other doctor appointments and will be having eye surgery next month. He will call back to schedule this procedure. FYI

## 2023-06-06 NOTE — Progress Notes (Signed)
[facility] CONSULT [facility]  Patient Care Team: Billie Lade, MD as PCP - General (Internal Medicine) Jonelle Sidle, MD as PCP - Cardiology (Cardiology) [name], DO as Consulting Physician (Gastroenterology)  CHIEF COMPLAINTS/PURPOSE OF CONSULTATION:  Right paratracheal lymphadenopathy  HISTORY OF PRESENTING ILLNESS:  Robert Lyons 62 y.o. male is seen in consultation today at the request of ER for right paratracheal adenopathy.  He was recently hospitalized for CVA on 01/19/2023.  As he complained of chest pain, CT CAP was done which showed partially visualized 1.6 x 1.3 cm ovoid soft tissue nodule to the right of the trachea concerning for enlarged lymph node.  This was followed with a CT soft tissue neck which showed 3.1 x 1.1 x 1.3 cm right paratracheal lymph node immediately posterior to the thyroid lobe.  Small paratracheal nodes are noted more inferiorly, closest node measuring 9 mm.  No enlarged cervical chain lymph nodes.  No clear head and neck primary seen.  He reports 7 pound weight loss in the last 1 month since his CVA.  He had some weakness on the left side and will start physical therapy on 02/10/2023.  He had decreased vision due to bilateral cataracts.  He lives at his home with his wife.  He reports some slight dysphagia and decreased appetite after the stroke.  Prior to the stroke, he did not have any dysphagia.  He is here today to review his PET scan.  Reports persistent dysphagia although it has not worsened.  States he feels like food gets stuck halfway down into his stomach.  Denies any worsening swelling of his neck.  He has met with [facility] Dr. 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  The lesion remains indeterminate in etiology.  Differentials to include a parathyroid adenoma or complex cyst such as esophageal duplication cyst or thymic remnant cyst versus X pontic lab Royd cyst nodule.  Given size stability over several months and malignant lymph node is considered less likely although not excluded.  Recommend a PET scan to rule out malignancy.  He is also seen [facility] to rule out Barrett's esophagus and esophageal cancer.  He also has history of GERD and dysphagia who recommends an upper endoscopy to assess for malignancy, esophagitis, gastritis, duodenitis, esophageal ring, esophageal web or other stricture/stenosis.  They also plan on possible dilatation pending findings.  He was to continue on omeprazole 40 mg once daily.  He is scheduled for an EGD on 07/03/2023.  MEDICAL HISTORY:  Past Medical History:  Diagnosis Date   GERD (gastroesophageal reflux disease)    Hypertension    Pneumonia    Stroke (HCC)    Type 2 diabetes mellitus (HCC)     SURGICAL HISTORY: Past Surgical History:  Procedure Laterality Date   No prior surgery      SOCIAL HISTORY: Social History   Socioeconomic History   Marital status: Single    Spouse name: Not on file   Number of children: Not on file   Years of education: Not on file   Highest education level: Not on file  Occupational History   Not on file  Tobacco Use   Smoking status: Former  Packs/day: 1.00    Years: 15.00    Additional pack years: 0.00    Total pack years: 15.00    Types: Cigarettes    Quit date: 01/17/2023    Years since quitting: 0.3   Smokeless tobacco: Never  Vaping Use   Vaping Use: Never used  Substance and Sexual Activity   Alcohol use: No    Comment: none since 2014   Drug use: No   Sexual activity: Yes  Other Topics Concern   Not on file  Social History Narrative   Not on file   Social Determinants of Health   Financial Resource Strain: High Risk (02/02/2023)   Overall  Financial Resource Strain (CARDIA)    Difficulty of Paying Living Expenses: Very hard  Food Insecurity: No Food Insecurity (01/31/2023)   Hunger Vital Sign    Worried About Running Out of Food in the Last Year: Never true    Ran Out of Food in the Last Year: Never true  Transportation Needs: Unmet Transportation Needs (01/31/2023)   PRAPARE - Administrator, Civil Service (Medical): Yes    Lack of Transportation (Non-Medical): No  Physical Activity: Not on file  Stress: Not on file  Social Connections: Not on file  Intimate Partner Violence: Not At Risk (01/31/2023)   Humiliation, Afraid, Rape, and Kick questionnaire    Fear of Current or Ex-Partner: No    Emotionally Abused: No    Physically Abused: No    Sexually Abused: No    FAMILY HISTORY: Family History  Problem Relation Age of Onset   Diabetes Mother    Hypertension Mother    Diabetes Maternal Aunt    Diabetes Maternal Uncle     ALLERGIES:  has No Known Allergies.  MEDICATIONS:  Current Outpatient Medications  Medication Sig Dispense Refill   acetaminophen (TYLENOL) 500 MG tablet Take 1,000 mg by mouth every 6 (six) hours as needed.     amLODipine (NORVASC) 5 MG tablet TAKE (1) TABLET BY MOUTH ONCE DAILY. 30 tablet 0   aspirin EC 81 MG tablet Take 1 tablet (81 mg total) by mouth daily with breakfast. Please take Aspirin 81 mg daily along with Plavix 75 mg daily for 21 days then after that STOP the Plavix  and continue ONLY Aspirin 81 mg daily indefinitely--for secondary stroke Prevention (Patient taking differently: Take 81 mg by mouth daily with breakfast.) 30 tablet 11   atorvastatin (LIPITOR) 80 MG tablet Take 1 tablet (80 mg total) by mouth daily. 30 tablet 11   gabapentin (NEURONTIN) 300 MG capsule Take 1 capsule (300 mg total) by mouth at bedtime. 30 capsule 2   Lancets (ACCU-CHEK MULTICLIX) lancets Use as instructed to test blood sugar 4 times daily. DX: E11.65 200 each 12   lisinopril (ZESTRIL) 40 MG  tablet Take 1 tablet (40 mg total) by mouth daily. 90 tablet 3   meclizine (ANTIVERT) 25 MG tablet TAKE 1 TABLET BY MOUTH 3 TIMES A DAY AS NEEDED FOR DIZZINESS. 12 tablet 0   metFORMIN (GLUCOPHAGE) 850 MG tablet Take 1 tablet (850 mg total) by mouth 2 (two) times daily with a meal. 180 tablet 4   metoprolol tartrate (LOPRESSOR) 50 MG tablet Take 1 tablet (50 mg total) by mouth 2 (two) times daily. 180 tablet 4   omeprazole (PRILOSEC) 40 MG capsule Take 1 capsule (40 mg total) by mouth daily. 90 capsule 0   ondansetron (ZOFRAN) 4 MG tablet Take 1 tablet (4 mg total) by mouth  every 8 (eight) hours as needed for nausea or vomiting. 20 tablet 0   Semaglutide,0.25 or 0.5MG /DOS, (OZEMPIC, 0.25 OR 0.5 MG/DOSE,) 2 MG/3ML SOPN Inject 0.5 mg into the skin once a week. 3 mL 0   glucose blood (ACCU-CHEK GUIDE) test strip Use as instructed to check blood sugar 4 times daily as directed. DX: E11.65 200 each 12   ketorolac (ACULAR) 0.5 % ophthalmic solution SMARTSIG:In Eye(s) (Patient not taking: Reported on 06/06/2023)     ofloxacin (OCUFLOX) 0.3 % ophthalmic solution  (Patient not taking: Reported on 06/06/2023)     prednisoLONE acetate (PRED FORTE) 1 % ophthalmic suspension SMARTSIG:In Eye(s) (Patient not taking: Reported on 06/06/2023)     No current facility-administered medications for this visit.    REVIEW OF SYSTEMS:   Review of Systems  Constitutional:  Positive for malaise/fatigue.  HENT:  Positive for sore throat.   Respiratory:  Positive for shortness of breath.   Cardiovascular:  Positive for chest pain.  Gastrointestinal:  Positive for constipation and heartburn.  Neurological:  Positive for dizziness and sensory change.  Psychiatric/Behavioral:  The patient has insomnia.     PHYSICAL EXAMINATION: ECOG PERFORMANCE STATUS: 1 - Symptomatic but completely ambulatory  Vitals:   06/06/23 1058  BP: 104/66  Pulse: 82  Resp: 16  Temp: 97.7 F (36.5 C)  SpO2: 100%   Filed Weights   06/06/23  1058  Weight: 188 lb 15 oz (85.7 kg)    Physical Exam Constitutional:      Appearance: Normal appearance.  Lymphadenopathy:     Cervical: Cervical adenopathy present.     Right cervical: Superficial cervical adenopathy and deep cervical adenopathy present.  Neurological:     Mental Status: He is alert.      LABORATORY DATA:  I have reviewed the data as listed Lab Results  Component Value Date   WBC 8.0 01/24/2023   HGB 14.6 01/24/2023   HCT 42.3 01/24/2023   MCV 89.2 01/24/2023   PLT 270 01/24/2023     Chemistry      Component Value Date/Time   NA 137 03/28/2023 1056   NA 138 12/02/2022 1538   K 3.5 03/28/2023 1056   CL 102 03/28/2023 1056   CO2 26 03/28/2023 1056   BUN 9 03/28/2023 1056   BUN 12 12/02/2022 1538   CREATININE 1.08 03/28/2023 1056      Component Value Date/Time   CALCIUM 9.5 03/28/2023 1056   ALKPHOS 58 01/18/2023 1402   AST 28 01/18/2023 1402   ALT 19 01/18/2023 1402   BILITOT 1.0 01/18/2023 1402   BILITOT 0.6 12/02/2022 1538       RADIOGRAPHIC STUDIES: I have personally reviewed the radiological images as listed and agreed with the findings in the report. NM PET Image Initial (PI) Skull Base To Thigh (F-18 FDG)  Result Date: 06/07/2023 CLINICAL DATA:  Initial treatment strategy for juxta thyroid, low right cervical lesion on prior CTs. EXAM: NUCLEAR MEDICINE PET SKULL BASE TO THIGH TECHNIQUE: 10.3 mCi F-18 FDG was injected intravenously. Full-ring PET imaging was performed from the skull base to thigh after the radiotracer. CT data was obtained and used for attenuation correction and anatomic localization. Fasting blood glucose: 216 mg/dl COMPARISON:  Neck CTs including 05/07/2023. 12/30/2022 chest abdomen and pelvic CTs. FINDINGS: Mediastinal blood pool activity: SUV max 2.8 Liver activity: SUV max NA NECK: Left deep parotid nodule measures 7 mm and a S.U.V. max of 2.6 on 65/3. This is similar in size on the prior  CT. The right-sided juxta  thyroid nodule measures 1.9 cm and is not hypermetabolic, including at a S.U.V. max of 0.9 on 105/3. No cervical nodal hypermetabolism. Incidental CT findings: Deferred to recent diagnostic CT. Bilateral carotid atherosclerosis. CHEST: No areas of abnormal hypermetabolism. Incidental CT findings: Aortic atherosclerosis. ABDOMEN/PELVIS: No abdominopelvic parenchymal or nodal hypermetabolism. Multifocal colonic hypermetabolism is likely physiologic. Incidental CT findings: Normal adrenal glands. Abdominal aortic atherosclerosis. SKELETON: No abnormal marrow activity. Incidental CT findings: None. IMPRESSION: 1. The right-sided low cervical/juxta thyroidal lesion is not hypermetabolic. Differential considerations remain parathyroid adenoma, esophageal/thymic cyst. Recommend contrast-enhanced neck CT follow-up in 6 months. 2. Mildly hypermetabolic left parotid nodule is most likely a benign lymph node. An incidental parotid neoplasm such as a Warthin's tumor could look similar. This can be re-evaluated on follow-up chest CT. 3.  Aortic Atherosclerosis (ICD10-I70.0). Electronically Signed   By: Jeronimo Greaves M.D.   On: 06/07/2023 15:54    ASSESSMENT:  1.  Right paratracheal lymphadenopathy: - Presentation with stroke on 01/19/2023. - CT CAP (01/19/2023): Partially visualized 1.6 x 1.3 cm ovoid soft tissue nodule in the right of the trachea concerning for enlarged lymph node.  No acute intrathoracic, abdominal or pelvic pathology. - CT soft tissue neck (01/19/2023): Right paratracheal lymph node measuring 3.1 x 1.1 x 1.3 cm.  Lesion is immediately posterior to the thyroid lobe and be very atypical to be a pedunculated thyroid lesion and with a normal calcium level.  Small paratracheal nodes more inferiorly, closest 9 mm. - Reports 7 pound weight loss in the last 1 month.  Reports slight dysphagia since CVA. - He has CVA with resulting left-sided weakness.  He also has some numbness on the left side of the body.  He  has decreased vision due to bilateral cataracts.  2.  Social/family history: - Lives at home with his wife.  He is currently retired and [employer] for living.  He quit his work after stroke.  He smoked 1 pack/day for the last 40 years, lately smoking 3 to 4 cigarettes/day and quit completely after the stroke. - Maternal grandmother's sister had cancer.  PLAN:  1.  Right paratracheal lymphadenopathy: - Discussed all previous imaging with patient and wife. -PET scan did not reveal any hypermetabolic activity in the right-sided low cervical junction to thyroid or lesion.  Differentials include parathyroid adenoma, esophageal/thymic cyst.  Recommend follow-up with contrast-enhanced neck CT in 6 months.  Mildly hypermetabolic left parotid nodule is most likely a benign lymph node. -Has follow-up scheduled with Gi and has an EGD scheduled on 07/03/2023.  Also has follow-up scheduled with ENT.  -No additional follow-up needed with oncology.  PLAN SUMMARY: >> No follow-up needed.     No orders of the defined types were placed in this encounter.   All questions were answered. The patient knows to call the clinic with any problems, questions or concerns.    Mauro Kaufmann, NP 06/09/2023 10:39 AM

## 2023-06-07 ENCOUNTER — Other Ambulatory Visit: Payer: Self-pay

## 2023-06-07 ENCOUNTER — Telehealth: Payer: Self-pay | Admitting: Internal Medicine

## 2023-06-07 ENCOUNTER — Encounter: Payer: Self-pay | Admitting: *Deleted

## 2023-06-07 DIAGNOSIS — E1136 Type 2 diabetes mellitus with diabetic cataract: Secondary | ICD-10-CM

## 2023-06-07 MED ORDER — ACCU-CHEK GUIDE VI STRP
ORAL_STRIP | 12 refills | Status: DC
Start: 2023-06-07 — End: 2024-06-06

## 2023-06-07 NOTE — Telephone Encounter (Signed)
Refills sent

## 2023-06-07 NOTE — Telephone Encounter (Signed)
Prescription Request  06/07/2023  LOV: 06/02/2023  What is the name of the medication or equipment? glucose blood (ACCU-CHEK GUIDE) test strip   Have you contacted your pharmacy to request a refill? Yes   Which pharmacy would you like this sent to?  [facility] - [place], [place] - [address] [place] [address] Phone: [phone] Fax: [phone]    Patient notified that their request is being sent to the clinical staff for review and that they should receive a response within 2 business days.   Please advise at [facility] [phone]

## 2023-06-07 NOTE — Telephone Encounter (Signed)
Pt called back and scheduled his procedure for 07/03/23. Instructions mailed.

## 2023-06-08 ENCOUNTER — Encounter: Payer: Self-pay | Admitting: Internal Medicine

## 2023-06-08 ENCOUNTER — Encounter: Payer: Self-pay | Admitting: *Deleted

## 2023-06-08 DIAGNOSIS — G629 Polyneuropathy, unspecified: Secondary | ICD-10-CM | POA: Insufficient documentation

## 2023-06-08 NOTE — Assessment & Plan Note (Addendum)
Followed by oncology and ENT.  PET scan completed yesterday (6/6).  Result is pending.

## 2023-06-08 NOTE — Assessment & Plan Note (Addendum)
A1c 8.7 on labs from December.  He is currently prescribed metformin 850 mg twice daily and Ozempic 0.5 mg weekly.  Ozempic was increased at his last appointment.  He states that he stopped taking Ozempic recently due to nausea for 1-2 days after each injection. -Through shared decision making, we will resume Ozempic today at 0.5 mg weekly.  I have added Zofran for as needed nausea relief.  He will notify us if this is ineffective in alleviating his symptoms. -Repeat A1c at next follow-up appointment -Diabetic foot exam completed today.  Podiatry referral placed for management of overgrown toenails.

## 2023-06-08 NOTE — Assessment & Plan Note (Signed)
He endorses burning pain and tingling in both feet, worse at night. -Trial gabapentin 300 mg nightly for pain relief

## 2023-06-09 DIAGNOSIS — R590 Localized enlarged lymph nodes: Secondary | ICD-10-CM | POA: Diagnosis not present

## 2023-06-12 ENCOUNTER — Other Ambulatory Visit: Payer: Self-pay

## 2023-06-12 DIAGNOSIS — E1136 Type 2 diabetes mellitus with diabetic cataract: Secondary | ICD-10-CM

## 2023-06-12 MED ORDER — LANCET DEVICE MISC
0 refills | Status: DC
Start: 2023-06-12 — End: 2023-12-07

## 2023-06-12 NOTE — Telephone Encounter (Signed)
Patient called said the pharmacy needs to know how often to test.

## 2023-06-12 NOTE — Telephone Encounter (Signed)
Spoke with patient and pharmacy.

## 2023-06-14 ENCOUNTER — Encounter: Payer: Self-pay | Admitting: Podiatry

## 2023-06-14 ENCOUNTER — Ambulatory Visit (INDEPENDENT_AMBULATORY_CARE_PROVIDER_SITE_OTHER): Payer: Medicaid Other | Admitting: Podiatry

## 2023-06-14 VITALS — BP 117/73

## 2023-06-14 DIAGNOSIS — E119 Type 2 diabetes mellitus without complications: Secondary | ICD-10-CM

## 2023-06-14 DIAGNOSIS — M79674 Pain in right toe(s): Secondary | ICD-10-CM | POA: Diagnosis not present

## 2023-06-14 DIAGNOSIS — B351 Tinea unguium: Secondary | ICD-10-CM

## 2023-06-14 DIAGNOSIS — M79675 Pain in left toe(s): Secondary | ICD-10-CM | POA: Diagnosis not present

## 2023-06-14 DIAGNOSIS — Z7689 Persons encountering health services in other specified circumstances: Secondary | ICD-10-CM | POA: Diagnosis not present

## 2023-06-14 NOTE — Progress Notes (Signed)
This patient presents to the office with chief complaint of long thick nails and diabetic feet.  This patient  says there  is  no pain and discomfort in their feet.  This patient says there are long thick painful nails.  These nails are painful walking and wearing shoes.  Patient has no history of infection or drainage from both feet.  Patient is unable to  self treat his own nails . This patient presents  to the office today for treatment of the  long nails and a foot evaluation due to history of  diabetes.  General Appearance  Alert, conversant and in no acute stress.  Vascular  Dorsalis pedis and posterior tibial  pulses are palpable  bilaterally.  Capillary return is within normal limits  bilaterally. Temperature is within normal limits  bilaterally.  Neurologic  Senn-Weinstein monofilament wire test within normal limits  bilaterally. Muscle power within normal limits bilaterally.  Nails Thick disfigured discolored nails with subungual debris  from hallux to fifth toes bilaterally. No evidence of bacterial infection or drainage bilaterally.  Orthopedic  No limitations of motion of motion feet .  No crepitus or effusions noted.  HAV  B/L  Skin  normotropic skin with no porokeratosis noted bilaterally.  No signs of infections or ulcers noted.     Onychomycosis  Diabetes with no foot complications  IE  Debride nails x 10.  A diabetic foot exam was performed and there is no evidence of any vascular or neurologic pathology.   RTC 3 months.   Marlana Mckowen DPM  

## 2023-06-26 DIAGNOSIS — Z419 Encounter for procedure for purposes other than remedying health state, unspecified: Secondary | ICD-10-CM | POA: Diagnosis not present

## 2023-06-26 NOTE — Patient Instructions (Addendum)
Robert Lyons  06/26/2023     @PREFPERIOPPHARMACY @   Your procedure is scheduled on  07/03/2023.   Report to [facility] at  0900  A.M.   Call this number if you have problems the morning of surgery:  [phone]  If you experience any cold or flu symptoms such as cough, fever, chills, shortness of breath, etc. between now and your scheduled surgery, please notify us at the above number.   Remember:  Follow the diet instructions given to you by the office.      Your last dose of ozempic should be on 06/25/2023.     DO NOT take any medications for diabetes the morning of your procedure.     Take these medicines the morning of surgery with A SIP OF WATER        amlodipine, antivert(if needed), omeprazole, zofran (if needed).     Do not wear jewelry, make-up or nail polish, including gel polish,  artificial nails, or any other type of covering on natural nails (fingers and  toes).  Do not wear lotions, powders, or perfumes, or deodorant.  Do not shave 48 hours prior to surgery.  Men may shave face and neck.  Do not bring valuables to the hospital.  [facility] is not responsible for any belongings or valuables.  Contacts, dentures or bridgework may not be worn into surgery.  Leave your suitcase in the car.  After surgery it may be brought to your room.  For patients admitted to the hospital, discharge time will be determined by your treatment team.  Patients discharged the day of surgery will not be allowed to drive home and must have someone with them for 24 hours.    Special instructions:   DO NOT smoke tobacco or vape for 24 hours before your procedure.  Please read over the following fact sheets that you were given. Anesthesia Post-op Instructions and Care and Recovery After Surgery       Upper Endoscopy, Adult, Care After After the procedure, it is common to have a sore throat. It is also common to have: Mild stomach pain or  discomfort. Bloating. Nausea. Follow these instructions at home: The instructions below may help you care for yourself at home. Your health care provider may give you more instructions. If you have questions, ask your health care provider. If you were given a sedative during the procedure, it can affect you for several hours. Do not drive or operate machinery until your health care provider says that it is safe. If you will be going home right after the procedure, plan to have a responsible adult: Take you home from the hospital or clinic. You will not be allowed to drive. Care for you for the time you are told. Follow instructions from your health care provider about what you may eat and drink. Return to your normal activities as told by your health care provider. Ask your health care provider what activities are safe for you. Take over-the-counter and prescription medicines only as told by your health care provider. Contact a health care provider if you: Have a sore throat that lasts longer than one day. Have trouble swallowing. Have a fever. Get help right away if you: Vomit blood or your vomit looks like coffee grounds. Have bloody, black, or tarry stools. Have a very bad sore throat or you cannot swallow. Have difficulty breathing or very bad pain in your chest or abdomen. These symptoms  may be an emergency. Get help right away. Call 911. Do not wait to see if the symptoms will go away. Do not drive yourself to the hospital. Summary After the procedure, it is common to have a sore throat, mild stomach discomfort, bloating, and nausea. If you were given a sedative during the procedure, it can affect you for several hours. Do not drive until your health care provider says that it is safe. Follow instructions from your health care provider about what you may eat and drink. Return to your normal activities as told by your health care provider. This information is not intended to replace  advice given to you by your health care provider. Make sure you discuss any questions you have with your health care provider. Document Revised: 03/23/2022 Document Reviewed: 03/23/2022 Elsevier Patient Education  2024 [employer] Esophageal Dilatation Esophageal dilatation, also called esophageal dilation, is a procedure to widen or open a blocked or narrowed part of the esophagus. The esophagus is the part of the body that moves food and liquid from the mouth to the stomach. You may need this procedure if: You have a buildup of scar tissue in your esophagus that makes it difficult, painful, or impossible to swallow. This can be caused by gastroesophageal reflux disease (GERD). You have cancer of the esophagus. There is a problem with how food moves through your esophagus. In some cases, you may need this procedure repeated at a later time to dilate the esophagus gradually. Tell a health care provider about: Any allergies you have. All medicines you are taking, including vitamins, herbs, eye drops, creams, and over-the-counter medicines. Any problems you or family members have had with anesthetic medicines. Any blood disorders you have. Any surgeries you have had. Any medical conditions you have. Any antibiotic medicines you are required to take before dental procedures. Whether you are pregnant or may be pregnant. What are the risks? Generally, this is a safe procedure. However, problems may occur, including: Bleeding due to a tear in the lining of the esophagus. A hole, or perforation, in the esophagus. What happens before the procedure? Ask your health care provider about: Changing or stopping your regular medicines. This is especially important if you are taking diabetes medicines or blood thinners. Taking medicines such as aspirin and ibuprofen. These medicines can thin your blood. Do not take these medicines unless your health care provider tells you to take them. Taking  over-the-counter medicines, vitamins, herbs, and supplements. Follow instructions from your health care provider about eating or drinking restrictions. Plan to have a responsible adult take you home from the hospital or clinic. Plan to have a responsible adult care for you for the time you are told after you leave the hospital or clinic. This is important. What happens during the procedure? You may be given a medicine to help you relax (sedative). A numbing medicine may be sprayed into the back of your throat, or you may gargle the medicine. Your health care provider may perform the dilatation using various surgical instruments, such as: Simple dilators. This instrument is carefully placed in the esophagus to stretch it. Guided wire bougies. This involves using an endoscope to insert a wire into the esophagus. A dilator is passed over this wire to enlarge the esophagus. Then the wire is removed. Balloon dilators. An endoscope with a small balloon is inserted into the esophagus. The balloon is inflated to stretch the esophagus and open it up. The procedure may vary among health care providers and  hospitals. What can I expect after the procedure? Your blood pressure, heart rate, breathing rate, and blood oxygen level will be monitored until you leave the hospital or clinic. Your throat may feel slightly sore and numb. This will get better over time. You will not be allowed to eat or drink until your throat is no longer numb. When you are able to drink, urinate, and sit on the edge of the bed without nausea or dizziness, you may be able to return home. Follow these instructions at home: Take over-the-counter and prescription medicines only as told by your health care provider. If you were given a sedative during the procedure, it can affect you for several hours. Do not drive or operate machinery until your health care provider says that it is safe. Plan to have a responsible adult care for you for  the time you are told. This is important. Follow instructions from your health care provider about any eating or drinking restrictions. Do not use any products that contain nicotine or tobacco, such as cigarettes, e-cigarettes, and chewing tobacco. If you need help quitting, ask your health care provider. Keep all follow-up visits. This is important. Contact a health care provider if: You have a fever. You have pain that is not relieved by medicine. Get help right away if: You have chest pain. You have trouble breathing. You have trouble swallowing. You vomit blood. You have black, tarry, or bloody stools. These symptoms may represent a serious problem that is an emergency. Do not wait to see if the symptoms will go away. Get medical help right away. Call your local emergency services (911 in the [place] Do not drive yourself to the hospital. Summary Esophageal dilatation, also called esophageal dilation, is a procedure to widen or open a blocked or narrowed part of the esophagus. Plan to have a responsible adult take you home from the hospital or clinic. For this procedure, a numbing medicine may be sprayed into the back of your throat, or you may gargle the medicine. Do not drive or operate machinery until your health care provider says that it is safe. This information is not intended to replace advice given to you by your health care provider. Make sure you discuss any questions you have with your health care provider. Document Revised: 04/29/2020 Document Reviewed: 04/29/2020 Elsevier Patient Education  2024 Else[employer] Inc. Monitored Anesthesia Care, Care After The following information offers guidance on how to care for yourself after your procedure. Your health care provider may also give you more specific instructions. If you have problems or questions, contact your health care provider. What can I expect after the procedure? After the procedure, it is common to  have: Tiredness. Little or no memory about what happened during or after the procedure. Impaired judgment when it comes to making decisions. Nausea or vomiting. Some trouble with balance. Follow these instructions at home: For the time period you were told by your health care provider:  Rest. Do not participate in activities where you could fall or become injured. Do not drive or use machinery. Do not drink alcohol. Do not take sleeping pills or medicines that cause drowsiness. Do not make important decisions or sign legal documents. Do not take care of children on your own. Medicines Take over-the-counter and prescription medicines only as told by your health care provider. If you were prescribed antibiotics, take them as told by your health care provider. Do not stop using the antibiotic even if you start to feel better.  Eating and drinking Follow instructions from your health care provider about what you may eat and drink. Drink enough fluid to keep your urine pale yellow. If you vomit: Drink clear fluids slowly and in small amounts as you are able. Clear fluids include water, ice chips, low-calorie sports drinks, and fruit juice that has water added to it (diluted fruit juice). Eat light and bland foods in small amounts as you are able. These foods include bananas, applesauce, rice, lean meats, toast, and crackers. General instructions  Have a responsible adult stay with you for the time you are told. It is important to have someone help care for you until you are awake and alert. If you have sleep apnea, surgery and some medicines can increase your risk for breathing problems. Follow instructions from your health care provider about wearing your sleep device: When you are sleeping. This includes during daytime naps. While taking prescription pain medicines, sleeping medicines, or medicines that make you drowsy. Do not use any products that contain nicotine or tobacco. These  products include cigarettes, chewing tobacco, and vaping devices, such as e-cigarettes. If you need help quitting, ask your health care provider. Contact a health care provider if: You feel nauseous or vomit every time you eat or drink. You feel light-headed. You are still sleepy or having trouble with balance after 24 hours. You get a rash. You have a fever. You have redness or swelling around the IV site. Get help right away if: You have trouble breathing. You have new confusion after you get home. These symptoms may be an emergency. Get help right away. Call 911. Do not wait to see if the symptoms will go away. Do not drive yourself to the hospital. This information is not intended to replace advice given to you by your health care provider. Make sure you discuss any questions you have with your health care provider. Document Revised: 05/09/2022 Document Reviewed: 05/09/2022 Elsevier Patient Education  2024 [employer].

## 2023-06-28 ENCOUNTER — Encounter (HOSPITAL_COMMUNITY): Payer: Self-pay

## 2023-06-28 ENCOUNTER — Encounter (HOSPITAL_COMMUNITY)
Admission: RE | Admit: 2023-06-28 | Discharge: 2023-06-28 | Disposition: A | Discharge status: Home / Self Care | Payer: Medicaid Other | Source: Ambulatory Visit | Attending: Internal Medicine | Admitting: Internal Medicine

## 2023-06-28 VITALS — BP 117/73 | HR 82 | Temp 97.7°F | Resp 18 | Ht 71.0 in | Wt 188.9 lb

## 2023-06-28 DIAGNOSIS — E119 Type 2 diabetes mellitus without complications: Secondary | ICD-10-CM | POA: Insufficient documentation

## 2023-06-28 DIAGNOSIS — Z01812 Encounter for preprocedural laboratory examination: Secondary | ICD-10-CM | POA: Insufficient documentation

## 2023-06-28 DIAGNOSIS — Z7689 Persons encountering health services in other specified circumstances: Secondary | ICD-10-CM | POA: Diagnosis not present

## 2023-06-28 LAB — BASIC METABOLIC PANEL
Anion gap: 9 (ref 5–15)
BUN: 8 mg/dL (ref 8–23)
CO2: 27 mmol/L (ref 22–32)
Calcium: 9.3 mg/dL (ref 8.9–10.3)
Chloride: 101 mmol/L (ref 98–111)
Creatinine, Ser: 1.15 mg/dL (ref 0.61–1.24)
GFR, Estimated: >60 mL/min (ref >60)
Glucose, Bld: 246 mg/dL — ABNORMAL HIGH (ref 70–99)
Potassium: 3.8 mmol/L (ref 3.5–5.1)
Sodium: 137 mmol/L (ref 135–145)

## 2023-07-03 ENCOUNTER — Encounter (HOSPITAL_COMMUNITY): Payer: Self-pay

## 2023-07-03 ENCOUNTER — Ambulatory Visit (HOSPITAL_COMMUNITY): Payer: Medicaid Other | Admitting: Anesthesiology

## 2023-07-03 ENCOUNTER — Ambulatory Visit (HOSPITAL_COMMUNITY)
Admission: RE | Admit: 2023-07-03 | Discharge: 2023-07-03 | Disposition: A | Discharge status: Home / Self Care | Payer: Medicaid Other | Attending: Internal Medicine | Admitting: Internal Medicine

## 2023-07-03 ENCOUNTER — Encounter (HOSPITAL_COMMUNITY)
Admission: RE | Disposition: A | Discharge status: Home / Self Care | Payer: Self-pay | Source: Home / Self Care | Attending: Internal Medicine

## 2023-07-03 DIAGNOSIS — K319 Disease of stomach and duodenum, unspecified: Secondary | ICD-10-CM | POA: Diagnosis not present

## 2023-07-03 DIAGNOSIS — K2289 Other specified disease of esophagus: Secondary | ICD-10-CM | POA: Diagnosis not present

## 2023-07-03 DIAGNOSIS — I679 Cerebrovascular disease, unspecified: Secondary | ICD-10-CM | POA: Diagnosis not present

## 2023-07-03 DIAGNOSIS — I10 Essential (primary) hypertension: Secondary | ICD-10-CM

## 2023-07-03 DIAGNOSIS — Z79899 Other long term (current) drug therapy: Secondary | ICD-10-CM | POA: Insufficient documentation

## 2023-07-03 DIAGNOSIS — Z8673 Personal history of transient ischemic attack (TIA), and cerebral infarction without residual deficits: Secondary | ICD-10-CM | POA: Diagnosis not present

## 2023-07-03 DIAGNOSIS — Z13810 Encounter for screening for upper gastrointestinal disorder: Secondary | ICD-10-CM | POA: Diagnosis not present

## 2023-07-03 DIAGNOSIS — K3189 Other diseases of stomach and duodenum: Secondary | ICD-10-CM | POA: Diagnosis not present

## 2023-07-03 DIAGNOSIS — Z5982 Transportation insecurity: Secondary | ICD-10-CM | POA: Diagnosis not present

## 2023-07-03 DIAGNOSIS — K2100 Gastro-esophageal reflux disease with esophagitis, without bleeding: Secondary | ICD-10-CM | POA: Diagnosis not present

## 2023-07-03 DIAGNOSIS — R1310 Dysphagia, unspecified: Secondary | ICD-10-CM | POA: Diagnosis not present

## 2023-07-03 DIAGNOSIS — Z7984 Long term (current) use of oral hypoglycemic drugs: Secondary | ICD-10-CM | POA: Diagnosis not present

## 2023-07-03 DIAGNOSIS — K219 Gastro-esophageal reflux disease without esophagitis: Secondary | ICD-10-CM

## 2023-07-03 DIAGNOSIS — R9389 Abnormal findings on diagnostic imaging of other specified body structures: Secondary | ICD-10-CM

## 2023-07-03 DIAGNOSIS — Z87891 Personal history of nicotine dependence: Secondary | ICD-10-CM | POA: Insufficient documentation

## 2023-07-03 DIAGNOSIS — K2950 Unspecified chronic gastritis without bleeding: Secondary | ICD-10-CM | POA: Diagnosis not present

## 2023-07-03 DIAGNOSIS — Z7689 Persons encountering health services in other specified circumstances: Secondary | ICD-10-CM | POA: Diagnosis not present

## 2023-07-03 DIAGNOSIS — K2970 Gastritis, unspecified, without bleeding: Secondary | ICD-10-CM | POA: Insufficient documentation

## 2023-07-03 DIAGNOSIS — E119 Type 2 diabetes mellitus without complications: Secondary | ICD-10-CM | POA: Diagnosis not present

## 2023-07-03 DIAGNOSIS — Z09 Encounter for follow-up examination after completed treatment for conditions other than malignant neoplasm: Secondary | ICD-10-CM | POA: Insufficient documentation

## 2023-07-03 DIAGNOSIS — Z5986 Financial insecurity: Secondary | ICD-10-CM | POA: Diagnosis not present

## 2023-07-03 HISTORY — PX: BIOPSY: SHX5522

## 2023-07-03 HISTORY — PX: ESOPHAGOGASTRODUODENOSCOPY (EGD) WITH PROPOFOL: SHX5813

## 2023-07-03 LAB — GLUCOSE, CAPILLARY: Glucose-Capillary: 131 mg/dL — ABNORMAL HIGH (ref 70–99)

## 2023-07-03 SURGERY — ESOPHAGOGASTRODUODENOSCOPY (EGD) WITH PROPOFOL
Anesthesia: General

## 2023-07-03 MED ORDER — LIDOCAINE HCL (CARDIAC) PF 100 MG/5ML IV SOSY
PREFILLED_SYRINGE | INTRAVENOUS | Status: DC | PRN
  Administered 2023-07-03: 50 mg via INTRAVENOUS

## 2023-07-03 MED ORDER — LACTATED RINGERS IV SOLN: INTRAVENOUS | Status: DC

## 2023-07-03 MED ORDER — PROPOFOL 10 MG/ML IV BOLUS
INTRAVENOUS | Status: DC | PRN
  Administered 2023-07-03: 100 mg via INTRAVENOUS
  Administered 2023-07-03: 50 mg via INTRAVENOUS

## 2023-07-03 NOTE — Transfer of Care (Signed)
Immediate Anesthesia Transfer of Care Note  Patient: Robert Lyons  Procedure(s) Performed: ESOPHAGOGASTRODUODENOSCOPY (EGD) WITH PROPOFOL BIOPSY  Patient Location: Short Stay  Anesthesia Type:General  Level of Consciousness: drowsy  Airway & Oxygen Therapy: Patient Spontanous Breathing  Post-op Assessment: Report given to RN and Post -op Vital signs reviewed and stable  Post vital signs: Reviewed and stable  Last Vitals:  Vitals Value Taken Time  BP 112/55 07/03/23 1144  Temp 36.6 C 07/03/23 1144  Pulse 74 07/03/23 1144  Resp 18 07/03/23 1144  SpO2 99 % 07/03/23 1144    Last Pain:  Vitals:   07/03/23 1144  TempSrc:   PainSc: Asleep         Complications: No notable events documented.

## 2023-07-03 NOTE — Anesthesia Preprocedure Evaluation (Signed)
Anesthesia Evaluation  Patient identified by MRN, date of birth, ID band Patient awake    Reviewed: Allergy & Precautions, H&P , NPO status , Patient's Chart, lab work & pertinent test results, reviewed documented beta blocker date and time   Airway Mallampati: II  TM Distance: >3 FB Neck ROM: Full    Dental  (+) Missing, Dental Advisory Given, Poor Dentition   Pulmonary pneumonia, former smoker   Pulmonary exam normal breath sounds clear to auscultation       Cardiovascular hypertension, Pt. on medications and Pt. on home beta blockers Normal cardiovascular exam Rhythm:Regular Rate:Normal  1. Left ventricular ejection fraction, by estimation, is 55 to 60%. The  left ventricle has normal function. The left ventricle has no regional  wall motion abnormalities. There is moderate concentric left ventricular  hypertrophy. Left ventricular  diastolic parameters are consistent with Grade I diastolic dysfunction  (impaired relaxation).   2. Right ventricular systolic function is normal. The right ventricular  size is normal. Tricuspid regurgitation signal is inadequate for assessing  PA pressure.   3. The mitral valve is grossly normal. Trivial mitral valve  regurgitation.   4. The aortic valve is tricuspid. Aortic valve regurgitation is not  visualized.   5. The inferior vena cava is normal in size with greater than 50%  respiratory variability, suggesting right atrial pressure of 3 mmHg.      Neuro/Psych  Neuromuscular disease CVA, Residual Symptoms  negative psych ROS   GI/Hepatic Neg liver ROS,GERD (dysphagia)  Medicated and Poorly Controlled,,  Endo/Other  diabetes, Well Controlled, Type 2, Oral Hypoglycemic Agents    Renal/GU negative Renal ROS  negative genitourinary   Musculoskeletal negative musculoskeletal ROS (+)    Abdominal   Peds negative pediatric ROS (+)  Hematology negative hematology ROS (+)    Anesthesia Other Findings IMPRESSION: 1. Lesion identified at the CTA chest likely represents a right paratracheal lymph node measuring 3.1 x 1.1 x 1.3 cm. Lesion is immediately posterior to the thyroid lobe be very atypical to be a pedunculated thyroid lesion and with a normal calcium level, parathyroid adenoma is also very unlikely. Given the size of this node, it is concerning for malignancy. No primary lesions are evident in the neck. Recommend PET scan for further workup of occult malignancy. 2. Smaller paratracheal nodes are noted more inferiorly, the closest node measures 9 mm. 3. No enlarged cervical chain nodes. 4.  Aortic Atherosclerosis (ICD10-I70.0).     Electronically Signed   By: Marin Roberts M.D.   On: 01/19/2023 16:52   Reproductive/Obstetrics negative OB ROS                             Anesthesia Physical Anesthesia Plan  ASA: 3  Anesthesia Plan: General   Post-op Pain Management: Minimal or no pain anticipated   Induction: Intravenous  PONV Risk Score and Plan: 1 and Propofol infusion and Treatment may vary due to age or medical condition  Airway Management Planned: Nasal Cannula and Natural Airway  Additional Equipment:   Intra-op Plan:   Post-operative Plan:   Informed Consent: I have reviewed the patients History and Physical, chart, labs and discussed the procedure including the risks, benefits and alternatives for the proposed anesthesia with the patient or authorized representative who has indicated his/her understanding and acceptance.     Dental advisory given  Plan Discussed with: CRNA and Surgeon  Anesthesia Plan Comments:  Anesthesia Quick Evaluation

## 2023-07-03 NOTE — Anesthesia Postprocedure Evaluation (Signed)
Anesthesia Post Note  Patient: Robert Lyons  Procedure(s) Performed: ESOPHAGOGASTRODUODENOSCOPY (EGD) WITH PROPOFOL BIOPSY  Patient location during evaluation: Phase II Anesthesia Type: General Level of consciousness: awake and alert and oriented Pain management: pain level controlled Vital Signs Assessment: post-procedure vital signs reviewed and stable Respiratory status: spontaneous breathing, nonlabored ventilation and respiratory function stable Cardiovascular status: blood pressure returned to baseline and stable Postop Assessment: no apparent nausea or vomiting Anesthetic complications: no  No notable events documented.   Last Vitals:  Vitals:   07/03/23 0923 07/03/23 1144  BP: (!) 146/77 (!) 112/55  Pulse: 72 74  Resp: 17 18  Temp: 36.4 C 36.6 C  SpO2: 99% 99%    Last Pain:  Vitals:   07/03/23 1146  TempSrc:   PainSc: 0-No pain                 Janei Scheff C Krisalyn Yankowski

## 2023-07-03 NOTE — Discharge Instructions (Signed)
EGD Discharge instructions Please read the instructions outlined below and refer to this sheet in the next few weeks. These discharge instructions provide you with general information on caring for yourself after you leave the hospital. Your doctor may also give you specific instructions. While your treatment has been planned according to the most current medical practices available, unavoidable complications occasionally occur. If you have any problems or questions after discharge, please call your doctor. ACTIVITY You may resume your regular activity but move at a slower pace for the next 24 hours.  Take frequent rest periods for the next 24 hours.  Walking will help expel (get rid of) the air and reduce the bloated feeling in your abdomen.  No driving for 24 hours (because of the anesthesia (medicine) used during the test).  You may shower.  Do not sign any important legal documents or operate any machinery for 24 hours (because of the anesthesia used during the test).  NUTRITION Drink plenty of fluids.  You may resume your normal diet.  Begin with a light meal and progress to your normal diet.  Avoid alcoholic beverages for 24 hours or as instructed by your caregiver.  MEDICATIONS You may resume your normal medications unless your caregiver tells you otherwise.  WHAT YOU CAN EXPECT TODAY You may experience abdominal discomfort such as a feeling of fullness or "gas" pains.  FOLLOW-UP Your doctor will discuss the results of your test with you.  SEEK IMMEDIATE MEDICAL ATTENTION IF ANY OF THE FOLLOWING OCCUR: Excessive nausea (feeling sick to your stomach) and/or vomiting.  Severe abdominal pain and distention (swelling).  Trouble swallowing.  Temperature over 101 F (37.8 C).  Rectal bleeding or vomiting of blood.   Your EGD revealed mild amount inflammation in your stomach.  I took biopsies of this to rule out infection with a bacteria called H. pylori.  Await pathology results, my  office will contact you.  Also took samples of your esophagus to screen for Barrett's esophagus.  Small bowel appeared normal.  Your esophagus was wide open, I did not need to stretch it today.  Continue on omeprazole daily  Follow-up in GI in 3 months.   I hope you have a great rest of your week!  Hennie Duos. Marletta Lor, D.O. Gastroenterology and Hepatology [facility]

## 2023-07-03 NOTE — H&P (Signed)
Primary Care Physician:  Billie Lade, MD Primary Gastroenterologist:  Dr. Marletta Lor  Pre-Procedure History & Physical: HPI:  Robert Lyons is a 62 y.o. male is here for an EGD with possible dilation to be performed for GERD, dysphagia, Barrett's screening  Past Medical History:  Diagnosis Date   GERD (gastroesophageal reflux disease)    Hypertension    Pneumonia    Stroke (HCC)    Type 2 diabetes mellitus (HCC)     Past Surgical History:  Procedure Laterality Date   No prior surgery      Prior to Admission medications   Medication Sig Start Date End Date Taking? Authorizing Provider  amLODipine (NORVASC) 5 MG tablet TAKE (1) TABLET BY MOUTH ONCE DAILY. 05/01/23  Yes Billie Lade, MD  aspirin EC 81 MG tablet Take 1 tablet (81 mg total) by mouth daily with breakfast. Please take Aspirin 81 mg daily along with Plavix 75 mg daily for 21 days then after that STOP the Plavix  and continue ONLY Aspirin 81 mg daily indefinitely--for secondary stroke Prevention Patient taking differently: Take 81 mg by mouth daily with breakfast. 01/20/23  Yes Emokpae, Courage, MD  atorvastatin (LIPITOR) 80 MG tablet Take 1 tablet (80 mg total) by mouth daily. 01/19/23 01/19/24 Yes Emokpae, Courage, MD  gabapentin (NEURONTIN) 300 MG capsule Take 1 capsule (300 mg total) by mouth at bedtime. 06/02/23  Yes Billie Lade, MD  glucose blood (ACCU-CHEK GUIDE) test strip Use as instructed to check blood sugar 4 times daily as directed. DX: E11.65 06/07/23  Yes Billie Lade, MD  Lancet Device MISC Use to check blood sugar 4 times daily. 06/12/23  Yes Billie Lade, MD  Lancets (ACCU-CHEK MULTICLIX) lancets Use as instructed to test blood sugar 4 times daily. DX: E11.65 03/02/23  Yes Billie Lade, MD  lisinopril (ZESTRIL) 40 MG tablet Take 1 tablet (40 mg total) by mouth daily. 01/19/23  Yes Emokpae, Courage, MD  meclizine (ANTIVERT) 25 MG tablet TAKE 1 TABLET BY MOUTH 3 TIMES A DAY AS NEEDED FOR DIZZINESS.  05/01/23  Yes Billie Lade, MD  metFORMIN (GLUCOPHAGE) 850 MG tablet Take 1 tablet (850 mg total) by mouth 2 (two) times daily with a meal. 01/19/23  Yes Emokpae, Courage, MD  metoprolol tartrate (LOPRESSOR) 50 MG tablet Take 1 tablet (50 mg total) by mouth 2 (two) times daily. 01/19/23  Yes Emokpae, Courage, MD  omeprazole (PRILOSEC) 40 MG capsule Take 1 capsule (40 mg total) by mouth daily. 05/19/23  Yes Billie Lade, MD  ondansetron (ZOFRAN) 4 MG tablet Take 1 tablet (4 mg total) by mouth every 8 (eight) hours as needed for nausea or vomiting. 06/02/23  Yes Billie Lade, MD  Semaglutide,0.25 or 0.5MG /DOS, (OZEMPIC, 0.25 OR 0.5 MG/DOSE,) 2 MG/3ML SOPN Inject 0.5 mg into the skin once a week. 06/02/23  Yes Billie Lade, MD  acetaminophen (TYLENOL) 500 MG tablet Take 1,000 mg by mouth every 6 (six) hours as needed.    [provider]  ketorolac (ACULAR) 0.5 % ophthalmic solution  06/01/23   [provider]  ofloxacin (OCUFLOX) 0.3 % ophthalmic solution  06/01/23   [provider]  prednisoLONE acetate (PRED FORTE) 1 % ophthalmic suspension  06/01/23   [provider]    Allergies as of 06/07/2023   (No Known Allergies)    Family History  Problem Relation Age of Onset   Diabetes Mother    Hypertension Mother    Diabetes  Maternal Aunt    Diabetes Maternal Uncle     Social History   Socioeconomic History   Marital status: Single    Spouse name: Not on file   Number of children: Not on file   Years of education: Not on file   Highest education level: Not on file  Occupational History   Not on file  Tobacco Use   Smoking status: Former    Packs/day: 1.00    Years: 15.00    Additional pack years: 0.00    Total pack years: 15.00    Types: Cigarettes    Quit date: 01/17/2023    Years since quitting: 0.4   Smokeless tobacco: Never  Vaping Use   Vaping Use: Never used  Substance and Sexual Activity   Alcohol use: No    Comment: none since 2014    Drug use: No   Sexual activity: Yes  Other Topics Concern   Not on file  Social History Narrative   Not on file   Social Determinants of Health   Financial Resource Strain: High Risk (02/02/2023)   Overall Financial Resource Strain (CARDIA)    Difficulty of Paying Living Expenses: Very hard  Food Insecurity: No Food Insecurity (01/31/2023)   Hunger Vital Sign    Worried About Running Out of Food in the Last Year: Never true    Ran Out of Food in the Last Year: Never true  Transportation Needs: Unmet Transportation Needs (01/31/2023)   PRAPARE - Administrator, Civil Service (Medical): Yes    Lack of Transportation (Non-Medical): No  Physical Activity: Not on file  Stress: Not on file  Social Connections: Not on file  Intimate Partner Violence: Not At Risk (01/31/2023)   Humiliation, Afraid, Rape, and Kick questionnaire    Fear of Current or Ex-Partner: No    Emotionally Abused: No    Physically Abused: No    Sexually Abused: No    Review of Systems: General: Negative for fever, chills, fatigue, weakness. Eyes: Negative for vision changes.  ENT: Negative for hoarseness, difficulty swallowing , nasal congestion. CV: Negative for chest pain, angina, palpitations, dyspnea on exertion, peripheral edema.  Respiratory: Negative for dyspnea at rest, dyspnea on exertion, cough, sputum, wheezing.  GI: See history of present illness. GU:  Negative for dysuria, hematuria, urinary incontinence, urinary frequency, nocturnal urination.  MS: Negative for joint pain, low back pain.  Derm: Negative for rash or itching.  Neuro: Negative for weakness, abnormal sensation, seizure, frequent headaches, memory loss, confusion.  Psych: Negative for anxiety, depression Endo: Negative for unusual weight change.  Heme: Negative for bruising or bleeding. Allergy: Negative for rash or hives.  Physical Exam: Vital signs in last 24 hours: Temp:  [97.6 F (36.4 C)] 97.6 F (36.4 C) (07/08  0923) Pulse Rate:  [72] 72 (07/08 0923) Resp:  [17] 17 (07/08 0923) BP: (146)/(77) 146/77 (07/08 0923) SpO2:  [99 %] 99 % (07/08 0923) Weight:  [85.7 kg] 85.7 kg (07/08 0923)   General:   Alert,  Well-developed, well-nourished, pleasant and cooperative in NAD Head:  Normocephalic and atraumatic. Eyes:  Sclera clear, no icterus.   Conjunctiva pink. Ears:  Normal auditory acuity. Nose:  No deformity, discharge,  or lesions. Msk:  Symmetrical without gross deformities. Normal posture. Extremities:  Without clubbing or edema. Neurologic:  Alert and  oriented x4;  grossly normal neurologically. Skin:  Intact without significant lesions or rashes. Psych:  Alert and cooperative. Normal mood and affect.  Impression/Plan: Dessa Phi is here for an EGD with possible dilation to be performed for GERD, dysphagia, Barrett's screening  Risks, benefits, limitations, imponderables and alternatives regarding EGD have been reviewed with the patient. Questions have been answered. All parties agreeable.

## 2023-07-03 NOTE — Op Note (Signed)
[facility] Patient Name: Robert Lyons Procedure Date: 07/03/2023 11:24 AM MRN: [id number] Date of Birth: December 17, 1961 Attending MD: Hennie Duos. Marletta Lor , [place], [id number] CSN: [id number] Age: 62 Admit Type: Outpatient Procedure:                Upper GI endoscopy Indications:              Screening for Barrett's esophagus in patient at                            risk for this condition, Dysphagia, Heartburn Providers:                Hennie Duos. Marletta Lor, DO, Sheran Fava, Pandora Leiter, Technician Referring MD:              Medicines:                See the Anesthesia note for documentation of the                            administered medications Complications:            No immediate complications. Estimated Blood Loss:     Estimated blood loss was minimal. Procedure:                Pre-Anesthesia Assessment:                           - The anesthesia plan was to use monitored                            anesthesia care (MAC).                           After obtaining informed consent, the endoscope was                            passed under direct vision. Throughout the                            procedure, the patient's blood pressure, pulse, and                            oxygen saturations were monitored continuously. The                            [id number]-[id number] ([id number]) scope was introduced through the                            mouth, and advanced to the second part of duodenum.                            The upper GI endoscopy was accomplished without                            difficulty.  The patient tolerated the procedure                            well. Scope In: 11:34:19 AM Scope Out: 11:39:47 AM Total Procedure Duration: 0 hours 5 minutes 28 seconds  Findings:      The Z-line was irregular and was found 39 cm from the incisors. Biopsies       were taken with a cold forceps for histology.      Localized mild inflammation characterized by  erythema and shallow       ulcerations was found in the gastric antrum. Biopsies were taken with a       cold forceps for Helicobacter pylori testing.      The duodenal bulb, first portion of the duodenum and second portion of       the duodenum were normal.      There is no endoscopic evidence of stenosis or stricture in the entire       esophagus. Impression:               - Z-line irregular, 39 cm from the incisors.                            Biopsied.                           - Gastritis. Biopsied.                           - Normal duodenal bulb, first portion of the                            duodenum and second portion of the duodenum. Moderate Sedation:      Per Anesthesia Care Recommendation:           - Patient has a contact number available for                            emergencies. The signs and symptoms of potential                            delayed complications were discussed with the                            patient. Return to normal activities tomorrow.                            Written discharge instructions were provided to the                            patient.                           - Resume previous diet.                           - Continue present medications.                           -  Await pathology results.                           - Use Prilosec (omeprazole) 40 mg PO daily.                           - Return to GI clinic in 3 months. Procedure Code(s):        --- Professional ---                           [phone], Esophagogastroduodenoscopy, flexible,                            transoral; with biopsy, single or multiple Diagnosis Code(s):        --- Professional ---                           K22.89, Other specified disease of esophagus                           K29.70, Gastritis, unspecified, without bleeding                           Z13.810, Encounter for screening for upper                            gastrointestinal disorder                            R13.10, Dysphagia, unspecified                           R12, Heartburn CPT copyright 2022 American Medical Association. All rights reserved. The codes documented in this report are preliminary and upon coder review may  be revised to meet current compliance requirements. Hennie Duos. Marletta Lor, DO Hennie Duos. Marletta Lor, DO 07/03/2023 11:41:46 AM This report has been signed electronically. Number of Addenda: 0

## 2023-07-04 LAB — SURGICAL PATHOLOGY

## 2023-07-07 ENCOUNTER — Encounter (HOSPITAL_COMMUNITY): Payer: Self-pay | Admitting: Internal Medicine

## 2023-07-07 DIAGNOSIS — H25811 Combined forms of age-related cataract, right eye: Secondary | ICD-10-CM | POA: Diagnosis not present

## 2023-07-07 DIAGNOSIS — Z7689 Persons encountering health services in other specified circumstances: Secondary | ICD-10-CM | POA: Diagnosis not present

## 2023-07-17 DIAGNOSIS — Z7689 Persons encountering health services in other specified circumstances: Secondary | ICD-10-CM | POA: Diagnosis not present

## 2023-07-27 ENCOUNTER — Other Ambulatory Visit: Payer: Self-pay | Admitting: Internal Medicine

## 2023-07-27 DIAGNOSIS — Z419 Encounter for procedure for purposes other than remedying health state, unspecified: Secondary | ICD-10-CM | POA: Diagnosis not present

## 2023-07-27 DIAGNOSIS — I10 Essential (primary) hypertension: Secondary | ICD-10-CM

## 2023-07-27 DIAGNOSIS — H2512 Age-related nuclear cataract, left eye: Secondary | ICD-10-CM | POA: Diagnosis not present

## 2023-08-01 DIAGNOSIS — Z7689 Persons encountering health services in other specified circumstances: Secondary | ICD-10-CM | POA: Diagnosis not present

## 2023-08-01 DIAGNOSIS — H25812 Combined forms of age-related cataract, left eye: Secondary | ICD-10-CM | POA: Diagnosis not present

## 2023-08-02 DIAGNOSIS — Z7689 Persons encountering health services in other specified circumstances: Secondary | ICD-10-CM | POA: Diagnosis not present

## 2023-08-10 DIAGNOSIS — Z7689 Persons encountering health services in other specified circumstances: Secondary | ICD-10-CM | POA: Diagnosis not present

## 2023-08-25 ENCOUNTER — Ambulatory Visit: Payer: Medicaid Other | Admitting: Internal Medicine

## 2023-08-27 DIAGNOSIS — Z419 Encounter for procedure for purposes other than remedying health state, unspecified: Secondary | ICD-10-CM | POA: Diagnosis not present

## 2023-09-11 DIAGNOSIS — Z7689 Persons encountering health services in other specified circumstances: Secondary | ICD-10-CM | POA: Diagnosis not present

## 2023-09-19 ENCOUNTER — Other Ambulatory Visit: Payer: Self-pay | Admitting: Internal Medicine

## 2023-09-19 DIAGNOSIS — I10 Essential (primary) hypertension: Secondary | ICD-10-CM

## 2023-09-26 DIAGNOSIS — Z419 Encounter for procedure for purposes other than remedying health state, unspecified: Secondary | ICD-10-CM | POA: Diagnosis not present

## 2023-09-28 DIAGNOSIS — Z7689 Persons encountering health services in other specified circumstances: Secondary | ICD-10-CM | POA: Diagnosis not present

## 2023-10-12 ENCOUNTER — Ambulatory Visit: Payer: Medicaid Other | Admitting: Internal Medicine

## 2023-10-17 ENCOUNTER — Ambulatory Visit: Payer: Medicaid Other | Admitting: Internal Medicine

## 2023-10-17 ENCOUNTER — Encounter: Payer: Self-pay | Admitting: Internal Medicine

## 2023-10-17 VITALS — BP 104/65 | HR 118 | Ht 71.0 in | Wt 185.6 lb

## 2023-10-17 DIAGNOSIS — Z7985 Long-term (current) use of injectable non-insulin antidiabetic drugs: Secondary | ICD-10-CM | POA: Diagnosis not present

## 2023-10-17 DIAGNOSIS — R079 Chest pain, unspecified: Secondary | ICD-10-CM

## 2023-10-17 DIAGNOSIS — E559 Vitamin D deficiency, unspecified: Secondary | ICD-10-CM | POA: Diagnosis not present

## 2023-10-17 DIAGNOSIS — L299 Pruritus, unspecified: Secondary | ICD-10-CM

## 2023-10-17 DIAGNOSIS — G6289 Other specified polyneuropathies: Secondary | ICD-10-CM | POA: Diagnosis not present

## 2023-10-17 DIAGNOSIS — E1136 Type 2 diabetes mellitus with diabetic cataract: Secondary | ICD-10-CM | POA: Diagnosis not present

## 2023-10-17 DIAGNOSIS — R590 Localized enlarged lymph nodes: Secondary | ICD-10-CM

## 2023-10-17 DIAGNOSIS — I10 Essential (primary) hypertension: Secondary | ICD-10-CM

## 2023-10-17 DIAGNOSIS — R058 Other specified cough: Secondary | ICD-10-CM

## 2023-10-17 MED ORDER — PROMETHAZINE-DM 6.25-15 MG/5ML PO SYRP
5.0000 mL | ORAL_SOLUTION | Freq: Four times a day (QID) | ORAL | 0 refills | Status: DC | PRN
Start: 2023-10-17 — End: 2024-01-18

## 2023-10-17 MED ORDER — HYDROXYZINE PAMOATE 25 MG PO CAPS: 25.0000 mg | ORAL_CAPSULE | Freq: Three times a day (TID) | ORAL | 1 refills | Status: DC | PRN

## 2023-10-17 NOTE — Assessment & Plan Note (Signed)
Promethazine-DM added today at patient's request for cough relief

## 2023-10-17 NOTE — Assessment & Plan Note (Signed)
Gabapentin 300 mg nightly was trialed at his last appointment after he endorsed burning pain and tingling in both feet with symptoms worse at night.  Today he states that symptoms have significantly improved.  No medication changes are indicated.

## 2023-10-17 NOTE — Assessment & Plan Note (Signed)
Seen by oncology for follow-up on 6/11.  PET scan did not show any hypermetabolic activity.  Repeat CT neck with contrast recommended for 6 months. -CT neck with contrast ordered today

## 2023-10-17 NOTE — Assessment & Plan Note (Signed)
Hydroxyzine 25 mg as needed for itch relief added today

## 2023-10-17 NOTE — Progress Notes (Signed)
Established Patient Office Visit  Subjective   Patient ID: Robert Lyons, male    DOB: 06-Sep-1961  Age: 62 y.o. MRN: [id number]  Chief Complaint  Patient presents with   Chest Pain    Chest pain off and on    Robert Lyons returns to care today for routine follow-up.  He was last evaluated by me on 6/7.  Ozempic was resumed and gabapentin 300 mg nightly was prescribed for relief of peripheral neuropathy.  In the interim, he has been seen by oncology and podiatry.  He underwent EGD with Dr. Marletta Lor on 7/8, which revealed gastritis and esophagitis.  There have otherwise been no acute interval events.  Robert Lyons reports feeling fairly well today.  He endorses nightly itching that has occurred for the last 2-3 weeks since moving into a new home.  He has not appreciated any lesions or insect bites.  He additionally endorses persistent intermittent chest pain.  Pain is located along the left chest wall and does not radiate.  Symptoms occur both with rest and activity, but he states his symptoms generally increased with activity.  Past Medical History:  Diagnosis Date   GERD (gastroesophageal reflux disease)    Hypertension    Pneumonia    Stroke [facility])    Type 2 diabetes mellitus (HCC)    Past Surgical History:  Procedure Laterality Date   BIOPSY  07/03/2023   Procedure: BIOPSY;  Surgeon: [name], DO;  Location: [facility];  Service: Endoscopy;;   ESOPHAGOGASTRODUODENOSCOPY (EGD) WITH PROPOFOL N/A 07/03/2023   Procedure: ESOPHAGOGASTRODUODENOSCOPY (EGD) WITH PROPOFOL;  Surgeon: [name], DO;  Location: [facility];  Service: Endoscopy;  Laterality: N/A;  10:30 AM, ASA 3   No prior surgery     Social History   Tobacco Use   Smoking status: Former    Current packs/day: 0.00    Average packs/day: 1 pack/day for 15.0 years (15.0 ttl pk-yrs)    Types: Cigarettes    Start date: 01/18/2008    Quit date: 01/17/2023    Years since quitting: 0.7   Smokeless tobacco:  Never  Vaping Use   Vaping status: Never Used  Substance Use Topics   Alcohol use: No    Comment: none since 2014   Drug use: No   Family History  Problem Relation Age of Onset   Diabetes Mother    Hypertension Mother    Diabetes Maternal Aunt    Diabetes Maternal Uncle    No Known Allergies  Review of Systems  Cardiovascular:  Positive for chest pain (Persistent left-sided chest pain).  Skin:  Positive for itching.  All other systems reviewed and are negative.    Objective:     BP 104/65 (BP Location: Left Arm, Patient Position: Sitting, Cuff Size: Normal)   Pulse (!) 118   Ht 5\' 11"  (1.803 m)   Wt 185 lb 9.6 oz (84.2 kg)   SpO2 96%   BMI 25.89 kg/m  BP Readings from Last 3 Encounters:  10/17/23 104/65  07/03/23 (!) 112/55  06/28/23 117/73   Physical Exam Vitals reviewed.  Constitutional:      General: He is not in acute distress.    Appearance: Normal appearance. He is not ill-appearing.  HENT:     Head: Normocephalic and atraumatic.     Right Ear: External ear normal.     Left Ear: External ear normal.     Nose: Nose normal. No congestion or rhinorrhea.  Mouth/Throat:     Mouth: Mucous membranes are moist.     Pharynx: Oropharynx is clear.  Eyes:     General: No scleral icterus.    Extraocular Movements: Extraocular movements intact.     Conjunctiva/sclera: Conjunctivae normal.     Pupils: Pupils are equal, round, and reactive to light.  Cardiovascular:     Rate and Rhythm: Normal rate and regular rhythm.     Pulses: Normal pulses.     Heart sounds: Normal heart sounds. No murmur heard. Pulmonary:     Effort: Pulmonary effort is normal.     Breath sounds: Normal breath sounds. No wheezing, rhonchi or rales.  Abdominal:     General: Abdomen is flat. Bowel sounds are normal. There is no distension.     Palpations: Abdomen is soft.     Tenderness: There is no abdominal tenderness.  Musculoskeletal:        General: Tenderness (vague TTP on  palpation of the left chest wall) present. No swelling or deformity. Normal range of motion.     Cervical back: Normal range of motion.  Skin:    General: Skin is warm and dry.     Capillary Refill: Capillary refill takes less than 2 seconds.  Neurological:     General: No focal deficit present.     Mental Status: He is alert and oriented to person, place, and time.     Motor: No weakness.  Psychiatric:        Mood and Affect: Mood normal.        Behavior: Behavior normal.        Thought Content: Thought content normal.   Last CBC Lab Results  Component Value Date   WBC 8.0 01/24/2023   HGB 14.6 01/24/2023   HCT 42.3 01/24/2023   MCV 89.2 01/24/2023   MCH 30.8 01/24/2023   RDW 11.8 01/24/2023   PLT 270 01/24/2023   Last metabolic panel Lab Results  Component Value Date   GLUCOSE 246 (H) 06/28/2023   NA 137 06/28/2023   K 3.8 06/28/2023   CL 101 06/28/2023   CO2 27 06/28/2023   BUN 8 06/28/2023   CREATININE 1.15 06/28/2023   GFRNONAA >60 06/28/2023   CALCIUM 9.3 06/28/2023   PROT 7.1 01/18/2023   ALBUMIN 4.1 01/18/2023   LABGLOB 2.5 12/02/2022   AGRATIO 1.9 12/02/2022   BILITOT 1.0 01/18/2023   ALKPHOS 58 01/18/2023   AST 28 01/18/2023   ALT 19 01/18/2023   ANIONGAP 9 06/28/2023   Last lipids Lab Results  Component Value Date   CHOL 81 01/19/2023   HDL 34 (L) 01/19/2023   LDLCALC 29 01/19/2023   TRIG 89 01/19/2023   CHOLHDL 2.4 01/19/2023   Last hemoglobin A1c Lab Results  Component Value Date   HGBA1C 8.7 (H) 12/02/2022   Last thyroid functions Lab Results  Component Value Date   TSH 1.890 12/02/2022   Last vitamin D Lab Results  Component Value Date   VD25OH 15.4 (L) 12/02/2022   Last vitamin B12 and Folate Lab Results  Component Value Date   VITAMINB12 381 12/02/2022   FOLATE 9.7 12/02/2022     Assessment & Plan:   Problem List Items Addressed This Visit     Essential hypertension    Remains adequately controlled on current  antihypertensive regimen.  No medication changes are indicated today.      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HCC) - Primary    A1c 8.7 on labs  from December 2023.  He is currently prescribed metformin 850 mg twice daily.  Ozempic 0.5 mg weekly was resumed at his last appointment, however he reports that he is not currently taking Ozempic because of weight loss.  Of note, his weight today is 185 pounds and was 189 pounds when last evaluated by me in June.  He has previously endorsed nausea as well. -Repeat A1c ordered today. Will need to at least increase metformin to 1000 mg twice daily if diabetes remains poorly controlled.      Peripheral neuropathy    Gabapentin 300 mg nightly was trialed at his last appointment after he endorsed burning pain and tingling in both feet with symptoms worse at night.  Today he states that symptoms have significantly improved.  No medication changes are indicated.      Paratracheal lymphadenopathy    Seen by oncology for follow-up on 6/11.  PET scan did not show any hypermetabolic activity.  Repeat CT neck with contrast recommended for 6 months. -CT neck with contrast ordered today      Chest pain    He endorses persistent left-sided chest pain.  Symptoms are generally worse with activity.  He endorses vague discomfort with palpation of the left chest wall.  Etiology is unclear.  He has multiple cardiac risk factors as well as a history of CVA.  Ultimately suspect musculoskeletal etiology, but will refer to cardiology for evaluation given persistence of symptoms.      Dry cough    Promethazine-DM added today at patient's request for cough relief      Itching    Hydroxyzine 25 mg as needed for itch relief added today      Return in about 3 months (around 01/17/2024).   Billie Lade, MD

## 2023-10-17 NOTE — Assessment & Plan Note (Signed)
 Remains adequately controlled on current antihypertensive regimen.  No medication changes are indicated today.

## 2023-10-17 NOTE — Patient Instructions (Signed)
It was a pleasure to see you today.  Thank you for giving [place] the opportunity to be involved in your care.  Below is a brief recap of your visit and next steps.  We will plan to see you again in 3 months.  Summary Try hydroxyzine for itch relief Repeat labs Repeat CT scan ordered Cardiology referral placed Follow up in 3 months

## 2023-10-17 NOTE — Assessment & Plan Note (Signed)
He endorses persistent left-sided chest pain.  Symptoms are generally worse with activity.  He endorses vague discomfort with palpation of the left chest wall.  Etiology is unclear.  He has multiple cardiac risk factors as well as a history of CVA.  Ultimately suspect musculoskeletal etiology, but will refer to cardiology for evaluation given persistence of symptoms.

## 2023-10-17 NOTE — Assessment & Plan Note (Signed)
A1c 8.7 on labs from December 2023.  He is currently prescribed metformin 850 mg twice daily.  Ozempic 0.5 mg weekly was resumed at his last appointment, however he reports that he is not currently taking Ozempic because of weight loss.  Of note, his weight today is 185 pounds and was 189 pounds when last evaluated by me in June.  He has previously endorsed nausea as well. -Repeat A1c ordered today. Will need to at least increase metformin to 1000 mg twice daily if diabetes remains poorly controlled.

## 2023-10-18 ENCOUNTER — Other Ambulatory Visit: Payer: Self-pay | Admitting: Internal Medicine

## 2023-10-18 DIAGNOSIS — E1136 Type 2 diabetes mellitus with diabetic cataract: Secondary | ICD-10-CM

## 2023-10-18 LAB — HEMOGLOBIN A1C
Est. average glucose Bld gHb Est-mCnc: 223 mg/dL
Hgb A1c MFr Bld: 9.4 % — ABNORMAL HIGH (ref 4.8–5.6)

## 2023-10-18 LAB — VITAMIN D 25 HYDROXY (VIT D DEFICIENCY, FRACTURES): Vit D, 25-Hydroxy: 30.3 ng/mL (ref 30.0–100.0)

## 2023-10-18 MED ORDER — METFORMIN HCL 1000 MG PO TABS
1000.0000 mg | ORAL_TABLET | Freq: Two times a day (BID) | ORAL | 3 refills | Status: DC
Start: 2023-10-18 — End: 2024-03-01

## 2023-10-24 ENCOUNTER — Telehealth: Payer: Self-pay | Admitting: Internal Medicine

## 2023-10-24 NOTE — Telephone Encounter (Signed)
Patient called said he did not speak to the provider about his neck, need scan of chest and head. Asking nurse to return patient call.

## 2023-10-26 NOTE — Telephone Encounter (Signed)
Pt reports chest pain,states he thought a ct of his head and a chest xray were also going to be needed. Advised he may need to go to ED if chest pain does not go away.

## 2023-10-27 DIAGNOSIS — Z419 Encounter for procedure for purposes other than remedying health state, unspecified: Secondary | ICD-10-CM | POA: Diagnosis not present

## 2023-11-06 ENCOUNTER — Other Ambulatory Visit: Payer: Self-pay | Admitting: Internal Medicine

## 2023-11-08 ENCOUNTER — Other Ambulatory Visit: Payer: Self-pay | Admitting: Internal Medicine

## 2023-11-08 DIAGNOSIS — I10 Essential (primary) hypertension: Secondary | ICD-10-CM

## 2023-11-25 ENCOUNTER — Emergency Department (HOSPITAL_COMMUNITY)
Admission: EM | Admit: 2023-11-25 | Discharge: 2023-11-25 | Disposition: A | Discharge status: Home / Self Care | Payer: Medicaid Other | Attending: Emergency Medicine | Admitting: Emergency Medicine

## 2023-11-25 ENCOUNTER — Emergency Department (HOSPITAL_COMMUNITY): Payer: Medicaid Other

## 2023-11-25 ENCOUNTER — Other Ambulatory Visit: Payer: Self-pay

## 2023-11-25 ENCOUNTER — Encounter (HOSPITAL_COMMUNITY): Payer: Self-pay | Admitting: Radiology

## 2023-11-25 DIAGNOSIS — R1084 Generalized abdominal pain: Secondary | ICD-10-CM | POA: Diagnosis not present

## 2023-11-25 DIAGNOSIS — I700 Atherosclerosis of aorta: Secondary | ICD-10-CM | POA: Diagnosis not present

## 2023-11-25 DIAGNOSIS — Z7982 Long term (current) use of aspirin: Secondary | ICD-10-CM | POA: Diagnosis not present

## 2023-11-25 DIAGNOSIS — R0789 Other chest pain: Secondary | ICD-10-CM | POA: Diagnosis not present

## 2023-11-25 DIAGNOSIS — Z1152 Encounter for screening for COVID-19: Secondary | ICD-10-CM | POA: Diagnosis not present

## 2023-11-25 DIAGNOSIS — I10 Essential (primary) hypertension: Secondary | ICD-10-CM | POA: Diagnosis not present

## 2023-11-25 DIAGNOSIS — Z79899 Other long term (current) drug therapy: Secondary | ICD-10-CM | POA: Insufficient documentation

## 2023-11-25 DIAGNOSIS — Z87891 Personal history of nicotine dependence: Secondary | ICD-10-CM | POA: Insufficient documentation

## 2023-11-25 DIAGNOSIS — R079 Chest pain, unspecified: Secondary | ICD-10-CM | POA: Insufficient documentation

## 2023-11-25 DIAGNOSIS — N281 Cyst of kidney, acquired: Secondary | ICD-10-CM | POA: Diagnosis not present

## 2023-11-25 LAB — CBC WITH DIFFERENTIAL/PLATELET
Abs Immature Granulocytes: 0.03 10*3/uL (ref 0.00–0.07)
Basophils Absolute: 0 10*3/uL (ref 0.0–0.1)
Basophils Relative: 1 %
Eosinophils Absolute: 0.1 10*3/uL (ref 0.0–0.5)
Eosinophils Relative: 2 %
HCT: 35.7 % — ABNORMAL LOW (ref 39.0–52.0)
Hemoglobin: 12.1 g/dL — ABNORMAL LOW (ref 13.0–17.0)
Immature Granulocytes: 1 %
Lymphocytes Relative: 42 %
Lymphs Abs: 2.8 10*3/uL (ref 0.7–4.0)
MCH: 30.9 pg (ref 26.0–34.0)
MCHC: 33.9 g/dL (ref 30.0–36.0)
MCV: 91.3 fL (ref 80.0–100.0)
Monocytes Absolute: 0.8 10*3/uL (ref 0.1–1.0)
Monocytes Relative: 13 %
Neutro Abs: 2.8 10*3/uL (ref 1.7–7.7)
Neutrophils Relative %: 41 %
Platelets: 222 10*3/uL (ref 150–400)
RBC: 3.91 MIL/uL — ABNORMAL LOW (ref 4.22–5.81)
RDW: 11.9 % (ref 11.5–15.5)
WBC: 6.6 10*3/uL (ref 4.0–10.5)
nRBC: 0 % (ref 0.0–0.2)

## 2023-11-25 LAB — RESP PANEL BY RT-PCR (RSV, FLU A&B, COVID)  RVPGX2
Influenza A by PCR: NEGATIVE
Influenza B by PCR: NEGATIVE
Resp Syncytial Virus by PCR: NEGATIVE
SARS Coronavirus 2 by RT PCR: NEGATIVE

## 2023-11-25 LAB — COMPREHENSIVE METABOLIC PANEL
ALT: 32 U/L (ref 0–44)
AST: 36 U/L (ref 15–41)
Albumin: 4.1 g/dL (ref 3.5–5.0)
Alkaline Phosphatase: 91 U/L (ref 38–126)
Anion gap: 11 (ref 5–15)
BUN: 10 mg/dL (ref 8–23)
CO2: 25 mmol/L (ref 22–32)
Calcium: 9.4 mg/dL (ref 8.9–10.3)
Chloride: 103 mmol/L (ref 98–111)
Creatinine, Ser: 1.15 mg/dL (ref 0.61–1.24)
GFR, Estimated: >60 mL/min (ref >60)
Glucose, Bld: 225 mg/dL — ABNORMAL HIGH (ref 70–99)
Potassium: 3.8 mmol/L (ref 3.5–5.1)
Sodium: 139 mmol/L (ref 135–145)
Total Bilirubin: 0.7 mg/dL (ref <1.2)
Total Protein: 7.5 g/dL (ref 6.5–8.1)

## 2023-11-25 LAB — TROPONIN I (HIGH SENSITIVITY)
Troponin I (High Sensitivity): 3 ng/L (ref <18)
Troponin I (High Sensitivity): 4 ng/L (ref <18)

## 2023-11-25 LAB — LIPASE, BLOOD: Lipase: 30 U/L (ref 11–51)

## 2023-11-25 LAB — CK: Total CK: 56 U/L (ref 49–397)

## 2023-11-25 MED ORDER — IOHEXOL 350 MG/ML SOLN
100.0000 mL | Freq: Once | INTRAVENOUS | Status: AC | PRN
  Administered 2023-11-25: 100 mL via INTRAVENOUS

## 2023-11-25 MED ORDER — HYDROMORPHONE HCL 1 MG/ML IJ SOLN
1.0000 mg | Freq: Once | INTRAMUSCULAR | Status: AC
  Administered 2023-11-25: 1 mg via INTRAVENOUS
  Filled 2023-11-25: qty 1

## 2023-11-25 MED ORDER — IPRATROPIUM-ALBUTEROL 0.5-2.5 (3) MG/3ML IN SOLN
3.0000 mL | Freq: Once | RESPIRATORY_TRACT | Status: AC
  Administered 2023-11-25: 3 mL via RESPIRATORY_TRACT
  Filled 2023-11-25: qty 3

## 2023-11-25 NOTE — ED Triage Notes (Signed)
Pt states he has chest pain and heart pain that started a few days. When patient moves he endorses pain all over. Pt also endorses lightheadedness. Pt states the chest pain goes straight through to the back. Pt also states he has abd pain that started around the same time. Pt unable to explain any of his pains just states everything is hurting.

## 2023-11-25 NOTE — ED Provider Notes (Signed)
[place] [facility]ERGENCY DEPARTMENT AT [facility] Provider Note   CSN: [id number] Arrival date & time: 11/25/23  1114     History  Chief Complaint  Patient presents with   Chest Pain    Robert Lyons is a 62 y.o. male.  HPI 62 year old male presents with a chief complaint of chest pain.  He is having chest and back pain bilaterally.  Comes and goes with no clear cause.  It is mostly sharp.  It has been going on for at least a week and maybe up to 2.  He also has a cough cannot bring anything up.  No fevers.  He feels like he has congestion in his lungs.  He is a former smoker.  He is also having abdominal pain though is not sure if that is from constipation.  No fevers during this time.  He has not taken any meds to try and help.  He states he has felt like this in the past with pneumonia. No urinary symptoms.  Home Medications Prior to Admission medications   Medication Sig Start Date End Date Taking? Authorizing Provider  metFORMIN (GLUCOPHAGE) 1000 MG tablet Take 1 tablet (1,000 mg total) by mouth 2 (two) times daily with a meal. 10/18/23   Billie Lade, MD  Semaglutide,0.25 or 0.5MG /DOS, (OZEMPIC, 0.25 OR 0.5 MG/DOSE,) 2 MG/3ML SOPN INJECT 0.5MG  INTO THE SKIN ONCE WEEKLY. 11/06/23   Billie Lade, MD  acetaminophen (TYLENOL) 500 MG tablet Take 1,000 mg by mouth every 6 (six) hours as needed.    [provider]  amLODipine (NORVASC) 5 MG tablet TAKE (1) TABLET BY MOUTH ONCE DAILY. 11/09/23   Billie Lade, MD  aspirin EC 81 MG tablet Take 1 tablet (81 mg total) by mouth daily with breakfast. Please take Aspirin 81 mg daily along with Plavix 75 mg daily for 21 days then after that STOP the Plavix  and continue ONLY Aspirin 81 mg daily indefinitely--for secondary stroke Prevention Patient taking differently: Take 81 mg by mouth daily with breakfast. 01/20/23   Shon Hale, MD  atorvastatin (LIPITOR) 80 MG tablet Take 1 tablet (80 mg total) by mouth  daily. 01/19/23 01/19/24  Shon Hale, MD  gabapentin (NEURONTIN) 300 MG capsule Take 1 capsule (300 mg total) by mouth at bedtime. 06/02/23   Billie Lade, MD  glucose blood (ACCU-CHEK GUIDE) test strip Use as instructed to check blood sugar 4 times daily as directed. DX: E11.65 06/07/23   Billie Lade, MD  hydrOXYzine (VISTARIL) 25 MG capsule Take 1 capsule (25 mg total) by mouth every 8 (eight) hours as needed. 10/17/23   Billie Lade, MD  ketorolac Berkley Harvey) 0.5 % ophthalmic solution  06/01/23   [provider]  Lancet Device MISC Use to check blood sugar 4 times daily. 06/12/23   Billie Lade, MD  Lancets (ACCU-CHEK MULTICLIX) lancets Use as instructed to test blood sugar 4 times daily. DX: E11.65 03/02/23   Billie Lade, MD  lisinopril (ZESTRIL) 40 MG tablet Take 1 tablet (40 mg total) by mouth daily. 01/19/23   Shon Hale, MD  meclizine (ANTIVERT) 25 MG tablet TAKE 1 TABLET BY MOUTH 3 TIMES A DAY AS NEEDED FOR DIZZINESS. 05/01/23   Billie Lade, MD  metoprolol tartrate (LOPRESSOR) 50 MG tablet Take 1 tablet (50 mg total) by mouth 2 (two) times daily. 01/19/23   Shon Hale, MD  ofloxacin (OCUFLOX) 0.3 % ophthalmic solution  06/01/23   [provider]  omeprazole (PRILOSEC) 40 MG capsule TAKE 1 CAPSULE BY MOUTH ONCE A DAY 09/20/23   Billie Lade, MD  ondansetron (ZOFRAN) 4 MG tablet Take 1 tablet (4 mg total) by mouth every 8 (eight) hours as needed for nausea or vomiting. 06/02/23   Billie Lade, MD  prednisoLONE acetate (PRED FORTE) 1 % ophthalmic suspension  06/01/23   [provider]  promethazine-dextromethorphan (PROMETHAZINE-DM) 6.25-15 MG/5ML syrup Take 5 mLs by mouth 4 (four) times daily as needed for cough. 10/17/23   Billie Lade, MD      Allergies    Patient has no known allergies.    Review of Systems   Review of Systems  Constitutional:  Negative for fever.  Respiratory:  Positive for cough and shortness of breath.    Cardiovascular:  Positive for chest pain.  Gastrointestinal:  Positive for abdominal pain.  Musculoskeletal:  Positive for back pain.    Physical Exam Updated Vital Signs BP (!) 143/84   Pulse 79   Temp 97.8 F (36.6 C) (Oral)   Resp 11   Ht 5\' 11"  (1.803 m)   Wt 83.9 kg   SpO2 99%   BMI 25.80 kg/m  Physical Exam Vitals and nursing note reviewed.  Constitutional:      Appearance: He is well-developed.  HENT:     Head: Normocephalic and atraumatic.  Cardiovascular:     Rate and Rhythm: Normal rate and regular rhythm.     Heart sounds: Normal heart sounds.  Pulmonary:     Effort: Pulmonary effort is normal. No tachypnea, accessory muscle usage or respiratory distress.     Breath sounds: Examination of the right-upper field reveals rhonchi. Examination of the left-upper field reveals rhonchi. Rhonchi present.  Chest:     Chest wall: No tenderness.  Abdominal:     Palpations: Abdomen is soft.     Tenderness: There is generalized abdominal tenderness.  Musculoskeletal:     Thoracic back: No tenderness.  Skin:    General: Skin is warm and dry.  Neurological:     Mental Status: He is alert.     ED Results / Procedures / Treatments   Labs (all labs ordered are listed, but only abnormal results are displayed) Labs Reviewed  COMPREHENSIVE METABOLIC PANEL - Abnormal; Notable for the following components:      Result Value   Glucose, Bld 225 (*)    All other components within normal limits  CBC WITH DIFFERENTIAL/PLATELET - Abnormal; Notable for the following components:   RBC 3.91 (*)    Hemoglobin 12.1 (*)    HCT 35.7 (*)    All other components within normal limits  RESP PANEL BY RT-PCR (RSV, FLU A&B, COVID)  RVPGX2  LIPASE, BLOOD  CK  TROPONIN I (HIGH SENSITIVITY)  TROPONIN I (HIGH SENSITIVITY)    EKG EKG Interpretation Date/Time:  Saturday November 25 2023 11:21:35 EST Ventricular Rate:  117 PR Interval:  126 QRS Duration:  86 QT Interval:  324 QTC  Calculation: 451 R Axis:   12  Text Interpretation: Sinus tachycardia Minimal voltage criteria for LVH, may be normal variant ( R in aVL ) Inferior infarct (cited on or before 18-Jan-2023) Cannot rule out Anterior infarct , age undetermined similar to jan 2024 Confirmed by Pricilla Loveless [phone]) on 11/25/2023 11:29:16 AM  Radiology CT Angio Chest/Abd/Pel for Dissection W and/or Wo Contrast  Result Date: 11/25/2023 CLINICAL DATA:  Acute aortic syndrome (AAS) suspected EXAM: CT ANGIOGRAPHY CHEST, ABDOMEN AND PELVIS  TECHNIQUE: Non-contrast CT of the chest was initially obtained. Multidetector CT imaging through the chest, abdomen and pelvis was performed using the standard protocol during bolus administration of intravenous contrast. Multiplanar reconstructed images and MIPs were obtained and reviewed to evaluate the vascular anatomy. RADIATION DOSE REDUCTION: This exam was performed according to the departmental dose-optimization program which includes automated exposure control, adjustment of the mA and/or kV according to patient size and/or use of iterative reconstruction technique. CONTRAST:  OMNIPAQUE IOHEXOL 350 MG/ML SOLN COMPARISON:  01/19/2023 FINDINGS: CTA CHEST FINDINGS Cardiovascular: Noncontrast CT of chest demonstrates no evidence of a thoracic aortic intramural hematoma. Periphery ensure opacification of the thoracic aorta. No aortic dissection or aneurysm. Three vessel arch with widely patent arch vessels. Mild aortic atherosclerosis. There is also satisfactory opacification of the pulmonary arteries. No central pulmonary arterial filling defect. Heart size is normal. No pericardial effusion. Mediastinum/Nodes: Stable 1.3 cm nodule in the right tracheal esophageal groove, better characterized by dedicated CT neck on 05/07/2023. No axillary, mediastinal, or hilar lymphadenopathy. Thyroid gland, trachea, and esophagus are within normal limits. Lungs/Pleura: Lungs are clear. No pleural  effusion or pneumothorax. Musculoskeletal: No acute bony abnormality. Bridging anterior endplate osteophytes throughout the mid to lower thoracic spine compatible with diffuse idiopathic skeletal hyperostosis. No chest wall abnormality. Review of the MIP images confirms the above findings. CTA ABDOMEN AND PELVIS FINDINGS VASCULAR Aorta: Normal caliber aorta without aneurysm, dissection, vasculitis or significant stenosis. Minimal atherosclerosis. Celiac: Patent without evidence of aneurysm, dissection, vasculitis or significant stenosis. SMA: Patent without evidence of aneurysm, dissection, vasculitis or significant stenosis. Renals: Both renal arteries are patent without evidence of aneurysm, dissection, vasculitis, fibromuscular dysplasia or significant stenosis. IMA: Patent. Inflow: Patent without evidence of aneurysm, dissection, vasculitis or significant stenosis. Veins: No obvious venous abnormality within the limitations of this arterial phase study. Review of the MIP images confirms the above findings. NON-VASCULAR Hepatobiliary: No focal liver abnormality is seen. No gallstones, gallbladder wall thickening, or biliary dilatation. Pancreas: Unremarkable. No pancreatic ductal dilatation or surrounding inflammatory changes. Spleen: Normal in size without focal abnormality. Adrenals/Urinary Tract: Unremarkable adrenal glands. Bilateral renal cysts which do not require follow-up imaging. Kidneys enhance symmetrically. No renal stone or hydronephrosis. Urinary bladder within normal limits. Stomach/Bowel: Stomach is within normal limits. Appendix appears normal. No evidence of bowel wall thickening, distention, or inflammatory changes. Lymphatic: No abdominopelvic lymphadenopathy. Reproductive: Prostate is unremarkable. Other: No free fluid. No abdominopelvic fluid collection. No pneumoperitoneum. No abdominal wall hernia. Musculoskeletal: No acute or significant osseous findings. Lower lumbar facet arthropathy.  Review of the MIP images confirms the above findings. IMPRESSION: 1. No evidence of aortic dissection or aneurysm. 2. No acute findings within the chest, abdomen, or pelvis. 3. Stable 1.3 cm nodule in the right tracheoesophageal groove, better characterized by dedicated CT neck on 05/07/2023. 4. Aortic atherosclerosis (ICD10-I70.0). Electronically Signed   By: Duanne Guess D.O.   On: 11/25/2023 13:26   DG Chest 2 View  Result Date: 11/25/2023 CLINICAL DATA:  Chest pain EXAM: CHEST - 2 VIEW COMPARISON:  Chest radiograph dated 01/18/2023 FINDINGS: Normal lung volumes. No focal consolidations. No pleural effusion or pneumothorax. The heart size and mediastinal contours are within normal limits. No acute osseous abnormality. IMPRESSION: No active cardiopulmonary disease. Electronically Signed   By: Agustin Cree M.D.   On: 11/25/2023 12:24    Procedures Procedures    Medications Ordered in ED Medications  HYDROmorphone (DILAUDID) injection 1 mg (1 mg Intravenous Given 11/25/23 1212)  ipratropium-albuterol (DUONEB)  0.5-2.5 (3) MG/3ML nebulizer solution 3 mL (3 mLs Nebulization Given 11/25/23 1224)  iohexol (OMNIPAQUE) 350 MG/ML injection 100 mL (100 mLs Intravenous Contrast Given 11/25/23 1248)    ED Course/ Medical Decision Making/ A&P                                 Medical Decision Making Amount and/or Complexity of Data Reviewed Labs: ordered.    Details: Normal troponins Radiology: ordered and independent interpretation performed.    Details: No dissection ECG/medicine tests: ordered and independent interpretation performed.    Details: Sinus tachycardia  Risk Prescription drug management.   Patient is tachycardic, otherwise well-appearing besides intermittent pain.  No clear source of his pain which is in the chest but also in the back, abdomen, multiple places.  He is afebrile and his labs are reassuring.  CT dissection was obtained given the multiple complaints including chest  pain but this is negative.  No signs of an obvious infection.  He is actually been having symptoms for a week plus and I think is stable for discharge, OTC pain control, and follow-up with PCP.  Heart rate normalized after pain control.  Vitals otherwise reassuring besides slight hypertension.  Given return precautions.        Final Clinical Impression(s) / ED Diagnoses Final diagnoses:  Nonspecific chest pain    Rx / DC Orders ED Discharge Orders     None         Pricilla Loveless, MD 11/25/23 1452

## 2023-11-25 NOTE — Discharge Instructions (Signed)
It is unclear what is causing your pain.  Be sure to take ibuprofen and/or Tylenol to help with pain.  Take your other medicines as directed by your doctor.  Call your doctor for urgent outpatient follow-up.  If you develop recurrent, continued, or worsening chest pain, shortness of breath, fever, vomiting, abdominal or back pain, or any other new/concerning symptoms then return to the ER for evaluation.

## 2023-11-25 NOTE — ED Notes (Signed)
Placed in gown and placed on cardiac monitoring.

## 2023-11-26 DIAGNOSIS — Z419 Encounter for procedure for purposes other than remedying health state, unspecified: Secondary | ICD-10-CM | POA: Diagnosis not present

## 2023-12-04 ENCOUNTER — Ambulatory Visit (HOSPITAL_COMMUNITY): Admission: RE | Admit: 2023-12-04 | Payer: Medicaid Other | Source: Ambulatory Visit

## 2023-12-07 ENCOUNTER — Other Ambulatory Visit: Payer: Self-pay | Admitting: Nurse Practitioner

## 2023-12-07 ENCOUNTER — Telehealth: Payer: Self-pay | Admitting: Cardiology

## 2023-12-07 ENCOUNTER — Ambulatory Visit: Payer: Medicaid Other | Attending: Nurse Practitioner | Admitting: Nurse Practitioner

## 2023-12-07 ENCOUNTER — Ambulatory Visit: Payer: Medicaid Other

## 2023-12-07 ENCOUNTER — Encounter: Payer: Self-pay | Admitting: Nurse Practitioner

## 2023-12-07 VITALS — BP 112/68 | HR 79 | Ht 71.0 in | Wt 188.0 lb

## 2023-12-07 DIAGNOSIS — R002 Palpitations: Secondary | ICD-10-CM | POA: Diagnosis not present

## 2023-12-07 DIAGNOSIS — Z8673 Personal history of transient ischemic attack (TIA), and cerebral infarction without residual deficits: Secondary | ICD-10-CM

## 2023-12-07 DIAGNOSIS — R079 Chest pain, unspecified: Secondary | ICD-10-CM | POA: Diagnosis not present

## 2023-12-07 DIAGNOSIS — I10 Essential (primary) hypertension: Secondary | ICD-10-CM

## 2023-12-07 MED ORDER — NITROGLYCERIN 0.4 MG SL SUBL: 0.4000 mg | SUBLINGUAL_TABLET | SUBLINGUAL | 1 refills | Status: DC | PRN

## 2023-12-07 NOTE — Progress Notes (Signed)
Cardiology Office Note:  .   Date:  12/07/2023  ID:  Robert Lyons, DOB [birth year], MRN [id number] PCP: Billie Lade, MD  [facility] Providers Cardiologist:  Nona Dell, MD    History of Present Illness: Robert Lyons   Robert Lyons is a very pleasant 62 y.o. male with a PMH of chest pain, HTN, T2DM, tobacco abuse, history of CVA in January 2024, and GERD, who presents today for overdue follow-up.   Last seen by Dr. Diona Browner on January 26, 2021.  It was noted that he had a relatively longstanding history of recurrent, atypical chest pain.  Lexi scan was arranged and was reassuring.  Echocardiogram was also arranged and revealed EF 55 to 60%, no significant valvular abnormalities.  ED visit on October 29, 2023 for chest pain, workup was overall unremarkable.  Troponins were normal.  CT angio chest/abdomen/pelvis revealed no evidence of aortic dissection/aneurysm, no acute findings, stable 1.3 cm nodule in the right transesophageal groove, as well as aortic atherosclerosis noted.  Today presents for overdue follow-up.  He states he continues to note chest pain that is associated with palpitations, says he can feel his heart skipping a beat during these episodes.  Difficult for patient to describe episodes completely.  He describes it as a constant sensation, says resting helps and taking Tylenol somewhat helps his symptoms.  Denies any aggravating factors.  Describes sensation as left-sided, says it "feels tight."  No longer smokes, says he quit smoking after his stroke. Denies any shortness of breath, syncope, presyncope, dizziness, orthopnea, PND, swelling or significant weight changes, acute bleeding, or claudication.  ROS: Negative.  See HPI.  Studies Reviewed: Robert Lyons    EKG:  EKG Interpretation Date/Time:  Thursday December 07 2023 95:62:13 EST Ventricular Rate:  74 PR Interval:  170 QRS Duration:  94 QT Interval:  384 QTC Calculation: 426 R Axis:   24  Text  Interpretation: Normal sinus rhythm Minimal voltage criteria for LVH, may be normal variant ( R in aVL ) Possible Inferior infarct (cited on or before 18-Jan-2023) When compared with ECG of 25-Nov-2023 11:21, Vent. rate has decreased BY  43 BPM Nonspecific T wave abnormality no longer evident in Anterolateral leads Confirmed by Sharlene Dory [phone]) on 12/07/2023 8:36:48 AM   Carotid duplex 12/2022:  IMPRESSION: 1. Atherosclerotic plaque involving bilateral carotid arteries. Estimated degree of stenosis in the internal carotid arteries is less than 50% bilaterally. 2. Patent vertebral arteries with antegrade flow.    Echo 12/2022:   1. Left ventricular ejection fraction, by estimation, is 55 to 60%. The  left ventricle has normal function. The left ventricle has no regional  wall motion abnormalities. There is moderate concentric left ventricular  hypertrophy. Left ventricular  diastolic parameters are consistent with Grade I diastolic dysfunction  (impaired relaxation).   2. Right ventricular systolic function is normal. The right ventricular  size is normal. Tricuspid regurgitation signal is inadequate for assessing  PA pressure.   3. The mitral valve is grossly normal. Trivial mitral valve  regurgitation.   4. The aortic valve is tricuspid. Aortic valve regurgitation is not  visualized.   5. The inferior vena cava is normal in size with greater than 50%  respiratory variability, suggesting right atrial pressure of 3 mmHg.   Comparison(s): No significant change from prior study. Prior images  reviewed side by side.   Lexiscan 01/2021:  There was no ST segment deviation noted during stress.   Normal resting and stress perfusion. No  ischemia or infarction EF Estimated 49% but wall motion appears normal Consider f/u echo  To evaluate EF     Physical Exam:   VS:  BP 112/68   Pulse 79   Ht 5\' 11"  (1.803 m)   Wt 188 lb (85.3 kg)   SpO2 96%   BMI 26.22 kg/m    Wt Readings from  Last 3 Encounters:  12/07/23 188 lb (85.3 kg)  11/25/23 185 lb (83.9 kg)  10/17/23 185 lb 9.6 oz (84.2 kg)    GEN: Well nourished, well developed in no acute distress NECK: No JVD; No carotid bruits CARDIAC: S1/S2, RRR, no murmurs, rubs, gallops RESPIRATORY:  Clear to auscultation without rales, wheezing or rhonchi  ABDOMEN: Soft, non-tender, non-distended EXTREMITIES:  No edema; No deformity   ASSESSMENT AND PLAN: .    Chest pain of uncertain etiology, palpitations Etiology unclear, however appears that his episodes also occur with palpitations.  Will arrange a 1 week ZIO XT monitor for further evaluation.  EKG today is reassuring.  Previous workup in ED at [facility] r/o ACS.  Recent CT scan did not reveal any significant acute findings within the chest, abdomen, or pelvis.  Lexiscan in 2022 was reassuring.  Longstanding history of atypical chest pain.  Will write Rx for nitroglycerin sublingual as needed. Educated him on this medication, and he verbalized understanding.  No medication changes at this time. Heart healthy diet and regular cardiovascular exercise encouraged.  Care and ED precautions discussed.   HTN Blood pressure stable. Discussed to monitor BP at home at least 2 hours after medications and sitting for 5-10 minutes. Heart healthy diet and regular cardiovascular exercise encouraged.   Hx of CVA Denies any symptoms.  History of acute infarct along the right pons in January 2024.  No longer smokes since stroke.  No medication changes at this time. Heart healthy diet and regular cardiovascular exercise encouraged. Continue to follow with PCP.   Dispo: Follow-up with me/APP in 6-8 weeks or sooner if anything changes.   Signed, Sharlene Dory, NP

## 2023-12-07 NOTE — Telephone Encounter (Signed)
Checking percert on the following   ZIO XT- 7 days- Palpitations

## 2023-12-07 NOTE — Patient Instructions (Addendum)
Medication Instructions:  Your physician has recommended you make the following change in your medication:  The proper use and anticipated side effects of nitroglycerine has been carefully explained.  If a single episode of chest pain is not relieved by one tablet, the patient will try another within 5 minutes; and if this doesn't relieve the pain, the patient is instructed to call 911 for transportation to an emergency department.  Labwork: None   Testing/Procedures: Your physician has recommended that you wear a Zio monitor.   This monitor is a medical device that records the heart's electrical activity. Doctors most often use these monitors to diagnose arrhythmias. Arrhythmias are problems with the speed or rhythm of the heartbeat. The monitor is a small device applied to your chest. You can wear one while you do your normal daily activities. While wearing this monitor if you have any symptoms to push the button and record what you felt. Once you have worn this monitor for the period of time provider prescribed (for 7 days), you will return the monitor device in the postage paid box. Once it is returned they will download the data collected and provide [place] with a report which the provider will then review and we will call you with those results. Important tips:  Avoid showering during the first 24 hours of wearing the monitor. Avoid excessive sweating to help maximize wear time. Do not submerge the device, no hot tubs, and no swimming pools. Keep any lotions or oils away from the patch. After 24 hours you may shower with the patch on. Take brief showers with your back facing the shower head.  Do not remove patch once it has been placed because that will interrupt data and decrease adhesive wear time. Push the button when you have any symptoms and write down what you were feeling. Once you have completed wearing your monitor, remove and place into box which has postage paid and place in your  outgoing mailbox.  If for some reason you have misplaced your box then call our office and we can provide another box and/or mail it off for you.  Follow-Up: Your physician recommends that you schedule a follow-up appointment in: 6-8 weeks   Any Other Special Instructions Will Be Listed Below (If Applicable).  If you need a refill on your cardiac medications before your next appointment, please call your pharmacy.

## 2023-12-21 ENCOUNTER — Ambulatory Visit (HOSPITAL_COMMUNITY): Payer: Medicaid Other

## 2023-12-27 DIAGNOSIS — Z419 Encounter for procedure for purposes other than remedying health state, unspecified: Secondary | ICD-10-CM | POA: Diagnosis not present

## 2024-01-15 ENCOUNTER — Telehealth: Payer: Self-pay | Admitting: Cardiology

## 2024-01-15 NOTE — Telephone Encounter (Signed)
Mailbox full unable to leave message.

## 2024-01-15 NOTE — Telephone Encounter (Signed)
Patient states he received a letter in the mail in regards to his monitor.  Could not find the letter while we talking on the phone.  Not sure what it may be as his monitor has already been resulted on & him notified.  He will try to find letter & bring with him to next visit in February.

## 2024-01-15 NOTE — Telephone Encounter (Signed)
Patient calling in regarding letter he received about a monitor. Please advise

## 2024-01-17 ENCOUNTER — Ambulatory Visit (HOSPITAL_COMMUNITY): Admission: RE | Admit: 2024-01-17 | Payer: Medicaid Other | Source: Ambulatory Visit

## 2024-01-18 ENCOUNTER — Ambulatory Visit (HOSPITAL_COMMUNITY)
Admission: RE | Admit: 2024-01-18 | Discharge: 2024-01-18 | Disposition: A | Discharge status: Home / Self Care | Payer: Medicaid Other | Source: Ambulatory Visit | Attending: Internal Medicine | Admitting: Internal Medicine

## 2024-01-18 ENCOUNTER — Ambulatory Visit (INDEPENDENT_AMBULATORY_CARE_PROVIDER_SITE_OTHER): Payer: Medicaid Other | Admitting: Internal Medicine

## 2024-01-18 ENCOUNTER — Ambulatory Visit (HOSPITAL_COMMUNITY): Payer: Medicaid Other

## 2024-01-18 ENCOUNTER — Encounter: Payer: Self-pay | Admitting: Internal Medicine

## 2024-01-18 VITALS — BP 112/64 | HR 97 | Ht 71.0 in | Wt 187.8 lb

## 2024-01-18 DIAGNOSIS — R590 Localized enlarged lymph nodes: Secondary | ICD-10-CM

## 2024-01-18 DIAGNOSIS — E1140 Type 2 diabetes mellitus with diabetic neuropathy, unspecified: Secondary | ICD-10-CM | POA: Diagnosis not present

## 2024-01-18 DIAGNOSIS — I10 Essential (primary) hypertension: Secondary | ICD-10-CM

## 2024-01-18 DIAGNOSIS — Z7984 Long term (current) use of oral hypoglycemic drugs: Secondary | ICD-10-CM | POA: Diagnosis not present

## 2024-01-18 DIAGNOSIS — E785 Hyperlipidemia, unspecified: Secondary | ICD-10-CM

## 2024-01-18 DIAGNOSIS — K219 Gastro-esophageal reflux disease without esophagitis: Secondary | ICD-10-CM

## 2024-01-18 DIAGNOSIS — E1136 Type 2 diabetes mellitus with diabetic cataract: Secondary | ICD-10-CM | POA: Diagnosis not present

## 2024-01-18 DIAGNOSIS — I700 Atherosclerosis of aorta: Secondary | ICD-10-CM | POA: Diagnosis not present

## 2024-01-18 DIAGNOSIS — I6523 Occlusion and stenosis of bilateral carotid arteries: Secondary | ICD-10-CM | POA: Diagnosis not present

## 2024-01-18 LAB — POCT I-STAT CREATININE: Creatinine, Ser: 1.3 mg/dL — ABNORMAL HIGH (ref 0.61–1.24)

## 2024-01-18 MED ORDER — AMLODIPINE BESYLATE 5 MG PO TABS: 5.0000 mg | ORAL_TABLET | Freq: Every day | ORAL | 3 refills | Status: AC

## 2024-01-18 MED ORDER — LANTUS SOLOSTAR 100 UNIT/ML ~~LOC~~ SOPN: 10.0000 [IU] | PEN_INJECTOR | Freq: Every day | SUBCUTANEOUS | 3 refills | Status: DC

## 2024-01-18 MED ORDER — LISINOPRIL 40 MG PO TABS: 40.0000 mg | ORAL_TABLET | Freq: Every day | ORAL | 3 refills | Status: DC

## 2024-01-18 MED ORDER — IOHEXOL 300 MG/ML  SOLN
80.0000 mL | Freq: Once | INTRAMUSCULAR | Status: AC | PRN
Start: 2024-01-18 — End: 2024-01-18
  Administered 2024-01-18: 80 mL via INTRAVENOUS

## 2024-01-18 MED ORDER — GABAPENTIN 300 MG PO CAPS: 300.0000 mg | ORAL_CAPSULE | Freq: Every day | ORAL | 2 refills | Status: DC

## 2024-01-18 MED ORDER — ATORVASTATIN CALCIUM 80 MG PO TABS: 80.0000 mg | ORAL_TABLET | Freq: Every day | ORAL | 3 refills | Status: DC

## 2024-01-18 MED ORDER — SACCHAROMYCES BOULARDII 250 MG PO CAPS: 250.0000 mg | ORAL_CAPSULE | Freq: Two times a day (BID) | ORAL | 3 refills | Status: DC

## 2024-01-18 MED ORDER — METOPROLOL TARTRATE 50 MG PO TABS: 50.0000 mg | ORAL_TABLET | Freq: Two times a day (BID) | ORAL | 4 refills | Status: DC

## 2024-01-18 MED ORDER — OMEPRAZOLE 40 MG PO CPDR: 40.0000 mg | DELAYED_RELEASE_CAPSULE | Freq: Every day | ORAL | 3 refills | Status: DC

## 2024-01-18 NOTE — Assessment & Plan Note (Signed)
PET scan obtained in June 2024 did not show any evidence of hypermetabolic activity.  Repeat CT neck with contrast recommended for 6 months.  CT completed earlier today.  Read is pending.  Further management pending results.

## 2024-01-18 NOTE — Patient Instructions (Signed)
It was a pleasure to see you today.  Thank you for giving [place] the opportunity to be involved in your care.  Below is a brief recap of your visit and next steps.  We will plan to see you again in 6 weeks.  Summary Start lantus 10 units nightly  Add probiotic for GI symptoms Follow up in 6 weeks for diabetes

## 2024-01-18 NOTE — Progress Notes (Signed)
Established Patient Office Visit  Subjective   Patient ID: Robert Lyons, male    DOB: 11-29-1961  Age: 63 y.o. MRN: [id number]  Chief Complaint  Patient presents with   bowel blockage    Patient feels he has a bowel blockage, has a terrible smell, stomach bubbling    Diabetes    Three month follow up    Robert Lyons returns to care today for routine follow-up.  He was last evaluated by me in October 2024.  Repeat labs were ordered, hydroxyzine was added for as needed itch relief, and CT neck with contrast ordered to follow-up on previously identified paratracheal lymphadenopathy.  22-month follow-up was arranged.  In the interim, he presented to the emergency department on 11/30 endorsing chest pain.  Cardiac workup was reassuring and he was referred to cardiology for follow-up.  Evaluated by cardiology in December and wore a Zio patch x 1 week.  No sustained arrhythmia identified.  There have otherwise been no acute interval events.  Today Robert Lyons reports feeling fairly well.  He endorses increased abdominal discomfort and flatulence in recent months.  He is otherwise asymptomatic and has no acute concerns to discuss.  Past Medical History:  Diagnosis Date   GERD (gastroesophageal reflux disease)    Hypertension    Pneumonia    Stroke [facility])    Type 2 diabetes mellitus (HCC)    Past Surgical History:  Procedure Laterality Date   BIOPSY  07/03/2023   Procedure: BIOPSY;  Surgeon: [name], DO;  [facility];  Service: Endoscopy;;   ESOPHAGOGASTRODUODENOSCOPY (EGD) WITH PROPOFOL N/A 07/03/2023   Procedure: ESOPHAGOGASTRODUODENOSCOPY (EGD) WITH PROPOFOL;  Surgeon: [name], DO;  [facility];  Service: Endoscopy;  Laterality: N/A;  10:30 AM, ASA 3   No prior surgery     Social History   Tobacco Use   Smoking status: Former    Current packs/day: 0.00    Average packs/day: 1 pack/day for 15.0 years (15.0 ttl pk-yrs)    Types: Cigarettes     Start date: 01/18/2008    Quit date: 01/17/2023    Years since quitting: 1.0   Smokeless tobacco: Never  Vaping Use   Vaping status: Never Used  Substance Use Topics   Alcohol use: No    Comment: none since 2014   Drug use: No   Family History  Problem Relation Age of Onset   Diabetes Mother    Hypertension Mother    Diabetes Maternal Aunt    Diabetes Maternal Uncle    No Known Allergies  Review of Systems  Constitutional:  Negative for chills and fever.  HENT:  Negative for sore throat.   Respiratory:  Negative for cough and shortness of breath.   Cardiovascular:  Negative for chest pain, palpitations and leg swelling.  Gastrointestinal:  Positive for abdominal pain and nausea. Negative for blood in stool, constipation, diarrhea and vomiting.       Increased flatulence  Genitourinary:  Negative for dysuria and hematuria.  Musculoskeletal:  Negative for myalgias.  Skin:  Negative for itching and rash.  Neurological:  Negative for dizziness and headaches.  Psychiatric/Behavioral:  Negative for depression and suicidal ideas.      Objective:     BP 112/64   Pulse 97   Ht 5\' 11"  (1.803 m)   Wt 187 lb 12.8 oz (85.2 kg)   SpO2 98%   BMI 26.19 kg/m  BP Readings from Last 3 Encounters:  01/18/24 112/64  12/07/23 112/68  11/25/23 (!) 143/84   Physical Exam Vitals reviewed.  Constitutional:      General: He is not in acute distress.    Appearance: Normal appearance. He is not ill-appearing.  HENT:     Head: Normocephalic and atraumatic.     Right Ear: External ear normal.     Left Ear: External ear normal.     Nose: Nose normal. No congestion or rhinorrhea.     Mouth/Throat:     Mouth: Mucous membranes are moist.     Pharynx: Oropharynx is clear.  Eyes:     General: No scleral icterus.    Extraocular Movements: Extraocular movements intact.     Conjunctiva/sclera: Conjunctivae normal.     Pupils: Pupils are equal, round, and reactive to light.   Cardiovascular:     Rate and Rhythm: Normal rate and regular rhythm.     Pulses: Normal pulses.     Heart sounds: Normal heart sounds. No murmur heard. Pulmonary:     Effort: Pulmonary effort is normal.     Breath sounds: Normal breath sounds. No wheezing, rhonchi or rales.  Abdominal:     General: Abdomen is flat. Bowel sounds are normal. There is no distension.     Palpations: Abdomen is soft.     Tenderness: There is no abdominal tenderness.  Musculoskeletal:        General: No swelling or deformity. Normal range of motion.     Cervical back: Normal range of motion.  Skin:    General: Skin is warm and dry.     Capillary Refill: Capillary refill takes less than 2 seconds.  Neurological:     General: No focal deficit present.     Mental Status: He is alert and oriented to person, place, and time.     Motor: No weakness.     Gait: Gait abnormal (ambulates with a cane).  Psychiatric:        Mood and Affect: Mood normal.        Behavior: Behavior normal.        Thought Content: Thought content normal.   Last CBC Lab Results  Component Value Date   WBC 6.6 11/25/2023   HGB 12.1 (L) 11/25/2023   HCT 35.7 (L) 11/25/2023   MCV 91.3 11/25/2023   MCH 30.9 11/25/2023   RDW 11.9 11/25/2023   PLT 222 11/25/2023   Last metabolic panel Lab Results  Component Value Date   GLUCOSE 225 (H) 11/25/2023   NA 139 11/25/2023   K 3.8 11/25/2023   CL 103 11/25/2023   CO2 25 11/25/2023   BUN 10 11/25/2023   CREATININE 1.30 (H) 01/18/2024   GFRNONAA >60 11/25/2023   CALCIUM 9.4 11/25/2023   PROT 7.5 11/25/2023   ALBUMIN 4.1 11/25/2023   LABGLOB 2.5 12/02/2022   AGRATIO 1.9 12/02/2022   BILITOT 0.7 11/25/2023   ALKPHOS 91 11/25/2023   AST 36 11/25/2023   ALT 32 11/25/2023   ANIONGAP 11 11/25/2023   Last lipids Lab Results  Component Value Date   CHOL 81 01/19/2023   HDL 34 (L) 01/19/2023   LDLCALC 29 01/19/2023   TRIG 89 01/19/2023   CHOLHDL 2.4 01/19/2023   Last  hemoglobin A1c Lab Results  Component Value Date   HGBA1C 9.4 (H) 10/17/2023   Last thyroid functions Lab Results  Component Value Date   TSH 1.890 12/02/2022   Last vitamin D Lab Results  Component Value Date   VD25OH 30.3  10/17/2023   Last vitamin B12 and Folate Lab Results  Component Value Date   VITAMINB12 381 12/02/2022   FOLATE 9.7 12/02/2022     Assessment & Plan:   Problem List Items Addressed This Visit       Essential hypertension   Remains adequately controlled on current antihypertensive regimen.  No medication changes are indicated today.      GERD (gastroesophageal reflux disease)   Reflux symptoms are adequately controlled with omeprazole 40 mg daily.  Probiotic prescribed today due to reported symptoms of increased flatulence and abdominal discomfort.  I also recommended increasing daily fiber intake.      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HCC)   A1c increased to 9.4 on labs from October.  Metformin was increased to 1000 mg twice daily.  He endorses adverse side effects with Ozempic.  We discussed additional medication options today for improved treatment of diabetes mellitus, specifically adding SGLT2i vs starting insulin.  He would like to start insulin.  Lantus 10 units nightly was prescribed today.  Follow-up in 6 weeks for diabetes management.  Urine microalbumin/current ratio ordered today.      Paratracheal lymphadenopathy   PET scan obtained in June 2024 did not show any evidence of hypermetabolic activity.  Repeat CT neck with contrast recommended for 6 months.  CT completed earlier today.  Read is pending.  Further management pending results.      Return in about 6 weeks (around 02/29/2024) for diabetes management.   Billie Lade, MD

## 2024-01-18 NOTE — Assessment & Plan Note (Signed)
A1c increased to 9.4 on labs from October.  Metformin was increased to 1000 mg twice daily.  He endorses adverse side effects with Ozempic.  We discussed additional medication options today for improved treatment of diabetes mellitus, specifically adding SGLT2i vs starting insulin.  He would like to start insulin.  Lantus 10 units nightly was prescribed today.  Follow-up in 6 weeks for diabetes management.  Urine microalbumin/current ratio ordered today.

## 2024-01-18 NOTE — Assessment & Plan Note (Signed)
 Remains adequately controlled on current antihypertensive regimen.  No medication changes are indicated today.

## 2024-01-18 NOTE — Assessment & Plan Note (Addendum)
Reflux symptoms are adequately controlled with omeprazole 40 mg daily.  Probiotic prescribed today due to reported symptoms of increased flatulence and abdominal discomfort.  I also recommended increasing daily fiber intake.

## 2024-01-19 ENCOUNTER — Other Ambulatory Visit: Payer: Self-pay

## 2024-01-19 ENCOUNTER — Telehealth: Payer: Self-pay | Admitting: Internal Medicine

## 2024-01-19 MED ORDER — BD PEN NEEDLE MICRO U/F 32G X 6 MM MISC: 1.0000 | Freq: Every day | 0 refills | Status: DC

## 2024-01-19 NOTE — Telephone Encounter (Signed)
Copied from CRM [id number]. Topic: Clinical - Prescription Issue >> Jan 19, 2024 11:01 AM Dennison Nancy wrote: Reason for CRM: Patient's provider put patient on  insulin glargine (LANTUS SOLOSTAR) 100 UNIT/ML Solostar Pen and is missing the needles    [facility] - [place], [place] - [address] [place] [address] Phone: [phone] Fax: [phone] Hours: Not open 24 hours

## 2024-01-19 NOTE — Telephone Encounter (Signed)
Sent to pharmacy

## 2024-01-20 LAB — MICROALBUMIN / CREATININE URINE RATIO
Creatinine, Urine: 25.7 mg/dL
Microalb/Creat Ratio: <12 mg/g{creat} (ref 0–29)
Microalbumin, Urine: <3 ug/mL

## 2024-01-23 ENCOUNTER — Telehealth: Payer: Self-pay

## 2024-01-23 NOTE — Telephone Encounter (Signed)
Copied from CRM [id number]. Topic: Clinical - Medication Question >> Jan 22, 2024  4:31 PM Mosetta Putt H wrote: Reason for CRM: looking to switch medications fast acting

## 2024-01-23 NOTE — Telephone Encounter (Signed)
Patient advised.

## 2024-01-25 ENCOUNTER — Encounter: Payer: Self-pay | Admitting: Podiatry

## 2024-01-25 ENCOUNTER — Ambulatory Visit (INDEPENDENT_AMBULATORY_CARE_PROVIDER_SITE_OTHER): Payer: Medicaid Other | Admitting: Podiatry

## 2024-01-25 DIAGNOSIS — B351 Tinea unguium: Secondary | ICD-10-CM

## 2024-01-25 DIAGNOSIS — E119 Type 2 diabetes mellitus without complications: Secondary | ICD-10-CM

## 2024-01-25 DIAGNOSIS — M79674 Pain in right toe(s): Secondary | ICD-10-CM | POA: Diagnosis not present

## 2024-01-25 DIAGNOSIS — M79675 Pain in left toe(s): Secondary | ICD-10-CM | POA: Diagnosis not present

## 2024-01-25 NOTE — Progress Notes (Signed)
This patient returns to my office for at risk foot care.  This patient requires this care by a professional since this patient will be at risk due to having diabetes.  This patient is unable to cut nails himself since the patient cannot reach his nails.These nails are painful walking and wearing shoes.  This patient presents for at risk foot care today.  General Appearance  Alert, conversant and in no acute stress.  Vascular  Dorsalis pedis and posterior tibial  pulses are palpable  bilaterally.  Capillary return is within normal limits  bilaterally. Temperature is within normal limits  bilaterally.  Neurologic  Senn-Weinstein monofilament wire test within normal limits  bilaterally. Muscle power within normal limits bilaterally.  Nails Thick disfigured discolored nails with subungual debris  from hallux to fifth toes bilaterally. No evidence of bacterial infection or drainage bilaterally.  Orthopedic  No limitations of motion  feet .  No crepitus or effusions noted.  No bony pathology or digital deformities noted.  Skin  normotropic skin with no porokeratosis noted bilaterally.  No signs of infections or ulcers noted.     Onychomycosis  Pain in right toes  Pain in left toes  Consent was obtained for treatment procedures.   Mechanical debridement of nails 1-5  bilaterally performed with a nail nipper.  Filed with dremel without incident.    Return office visit                     Told patient to return for periodic foot care and evaluation due to potential at risk complications.   Helane Gunther DPM

## 2024-01-27 DIAGNOSIS — Z419 Encounter for procedure for purposes other than remedying health state, unspecified: Secondary | ICD-10-CM | POA: Diagnosis not present

## 2024-02-05 ENCOUNTER — Ambulatory Visit: Payer: Medicaid Other | Admitting: Cardiology

## 2024-02-23 ENCOUNTER — Other Ambulatory Visit: Payer: Self-pay

## 2024-02-23 ENCOUNTER — Telehealth: Payer: Self-pay | Admitting: Internal Medicine

## 2024-02-23 NOTE — Telephone Encounter (Signed)
 Copied from CRM [id number]. Topic: Clinical - Medication Refill >> Feb 23, 2024  8:28 AM Dennison Nancy wrote: Most Recent Primary Care Visit:  Provider: Christel Mormon E  Department: [facility] CARE  Visit Type: OFFICE VISIT  Date: 01/18/2024  Medication: Lancets (ACCU-CHEK MULTICLIX) lancets  Has the patient contacted their pharmacy? No (Agent: If no, request that the patient contact the pharmacy for the refill. If patient does not wish to contact the pharmacy document the reason why and proceed with request.) (Agent: If yes, when and what did the pharmacy advise?)  Is this the correct pharmacy for this prescription? Yes If no, delete pharmacy and type the correct one.  This is the patient's preferred pharmacy:  [facility] - [place], [place] - [address] [place] [address] Phone: [phone] Fax: [phone]   Has the prescription been filled recently? No  Is the patient out of the medication? No  will be out by the end of the day   Has the patient been seen for an appointment in the last year OR does the patient have an upcoming appointment? Yes  Can we respond through MyChart? No  Agent: Please be advised that Rx refills may take up to 3 business days. We ask that you follow-up with your pharmacy.

## 2024-02-24 DIAGNOSIS — Z419 Encounter for procedure for purposes other than remedying health state, unspecified: Secondary | ICD-10-CM | POA: Diagnosis not present

## 2024-02-27 ENCOUNTER — Other Ambulatory Visit: Payer: Self-pay

## 2024-02-27 DIAGNOSIS — E1136 Type 2 diabetes mellitus with diabetic cataract: Secondary | ICD-10-CM

## 2024-02-27 MED ORDER — ACCU-CHEK SOFTCLIX LANCETS MISC: 12 refills | Status: AC

## 2024-02-27 NOTE — Telephone Encounter (Signed)
 Refills sent to pharmacy.

## 2024-03-01 ENCOUNTER — Ambulatory Visit: Payer: Medicaid Other | Admitting: Internal Medicine

## 2024-03-01 ENCOUNTER — Encounter: Payer: Self-pay | Admitting: Internal Medicine

## 2024-03-01 VITALS — BP 131/75 | HR 78 | Ht 71.0 in | Wt 187.6 lb

## 2024-03-01 DIAGNOSIS — G8929 Other chronic pain: Secondary | ICD-10-CM

## 2024-03-01 DIAGNOSIS — E1136 Type 2 diabetes mellitus with diabetic cataract: Secondary | ICD-10-CM

## 2024-03-01 DIAGNOSIS — Z794 Long term (current) use of insulin: Secondary | ICD-10-CM

## 2024-03-01 DIAGNOSIS — M546 Pain in thoracic spine: Secondary | ICD-10-CM

## 2024-03-01 DIAGNOSIS — R590 Localized enlarged lymph nodes: Secondary | ICD-10-CM

## 2024-03-01 MED ORDER — CYCLOBENZAPRINE HCL 5 MG PO TABS: 5.0000 mg | ORAL_TABLET | Freq: Three times a day (TID) | ORAL | 1 refills | Status: DC | PRN

## 2024-03-01 MED ORDER — LANTUS SOLOSTAR 100 UNIT/ML ~~LOC~~ SOPN: 14.0000 [IU] | PEN_INJECTOR | Freq: Every day | SUBCUTANEOUS | 3 refills | Status: DC

## 2024-03-01 MED ORDER — METFORMIN HCL 1000 MG PO TABS: 1000.0000 mg | ORAL_TABLET | Freq: Two times a day (BID) | ORAL | 3 refills | Status: DC

## 2024-03-01 NOTE — Progress Notes (Signed)
 Established Patient Office Visit  Subjective   Patient ID: Robert Lyons, male    DOB: Sep 09, 1961  Age: 63 y.o. MRN: [id number]  Chief Complaint  Patient presents with   Diabetes    Six week follow up    Mr. Robert Lyons returns to care today for diabetes management.  He was last evaluated by me on 1/23.  His A1c had increased to 9.4.  Lantus 10 units nightly was added and 6-week follow-up was arranged.  In the interim he was evaluated by podiatry.  There have otherwise been no acute interval events.  Today he endorses chronic thoracic back pain.  This is not a new issue.  He has previously seen cardiology and has undergone imaging that has largely been unrevealing.  He has been using Lantus as prescribed.  He reports that when blood sugar readings have been 150-160.  He does not have additional concerns to discuss today.  Past Medical History:  Diagnosis Date   GERD (gastroesophageal reflux disease)    Hypertension    Pneumonia    Stroke [facility])    Type 2 diabetes mellitus (HCC)    Past Surgical History:  Procedure Laterality Date   BIOPSY  07/03/2023   Procedure: BIOPSY;  Surgeon: [name], DO;  [facility];  Service: Endoscopy;;   ESOPHAGOGASTRODUODENOSCOPY (EGD) WITH PROPOFOL N/A 07/03/2023   Procedure: ESOPHAGOGASTRODUODENOSCOPY (EGD) WITH PROPOFOL;  Surgeon: [name], DO;  [facility];  Service: Endoscopy;  Laterality: N/A;  10:30 AM, ASA 3   No prior surgery     Social History   Tobacco Use   Smoking status: Former    Current packs/day: 0.00    Average packs/day: 1 pack/day for 15.0 years (15.0 ttl pk-yrs)    Types: Cigarettes    Start date: 01/18/2008    Quit date: 01/17/2023    Years since quitting: 1.1   Smokeless tobacco: Never  Vaping Use   Vaping status: Never Used  Substance Use Topics   Alcohol use: No    Comment: none since 2014   Drug use: No   Family History  Problem Relation Age of Onset   Diabetes Mother     Hypertension Mother    Diabetes Maternal Aunt    Diabetes Maternal Uncle    No Known Allergies  Review of Systems  Musculoskeletal:  Positive for back pain (Thoracic back pain).  All other systems reviewed and are negative.    Objective:     BP 131/75 (BP Location: Left Arm, Patient Position: Sitting, Cuff Size: Normal)   Pulse 78   Ht 5\' 11"  (1.803 m)   Wt 187 lb 9.6 oz (85.1 kg)   SpO2 96%   BMI 26.16 kg/m  BP Readings from Last 3 Encounters:  03/01/24 131/75  01/18/24 112/64  12/07/23 112/68   Physical Exam Vitals reviewed.  Constitutional:      General: He is not in acute distress.    Appearance: Normal appearance. He is not ill-appearing.  HENT:     Head: Normocephalic and atraumatic.     Right Ear: External ear normal.     Left Ear: External ear normal.     Nose: Nose normal. No congestion or rhinorrhea.     Mouth/Throat:     Mouth: Mucous membranes are moist.     Pharynx: Oropharynx is clear.  Eyes:     General: No scleral icterus.    Extraocular Movements: Extraocular movements intact.  Conjunctiva/sclera: Conjunctivae normal.     Pupils: Pupils are equal, round, and reactive to light.  Cardiovascular:     Rate and Rhythm: Normal rate and regular rhythm.     Pulses: Normal pulses.     Heart sounds: Normal heart sounds. No murmur heard. Pulmonary:     Effort: Pulmonary effort is normal.     Breath sounds: Normal breath sounds. No wheezing, rhonchi or rales.  Abdominal:     General: Abdomen is flat. Bowel sounds are normal. There is no distension.     Palpations: Abdomen is soft.     Tenderness: There is no abdominal tenderness.  Musculoskeletal:        General: No swelling or deformity.     Cervical back: Normal range of motion.  Skin:    General: Skin is warm and dry.     Capillary Refill: Capillary refill takes less than 2 seconds.  Neurological:     General: No focal deficit present.     Mental Status: He is alert and oriented to person,  place, and time.     Motor: No weakness.     Gait: Gait abnormal (ambulates with a cane).  Psychiatric:        Mood and Affect: Mood normal.        Behavior: Behavior normal.        Thought Content: Thought content normal.   Last CBC Lab Results  Component Value Date   WBC 6.6 11/25/2023   HGB 12.1 (L) 11/25/2023   HCT 35.7 (L) 11/25/2023   MCV 91.3 11/25/2023   MCH 30.9 11/25/2023   RDW 11.9 11/25/2023   PLT 222 11/25/2023   Last metabolic panel Lab Results  Component Value Date   GLUCOSE 225 (H) 11/25/2023   NA 139 11/25/2023   K 3.8 11/25/2023   CL 103 11/25/2023   CO2 25 11/25/2023   BUN 10 11/25/2023   CREATININE 1.30 (H) 01/18/2024   GFRNONAA >60 11/25/2023   CALCIUM 9.4 11/25/2023   PROT 7.5 11/25/2023   ALBUMIN 4.1 11/25/2023   LABGLOB 2.5 12/02/2022   AGRATIO 1.9 12/02/2022   BILITOT 0.7 11/25/2023   ALKPHOS 91 11/25/2023   AST 36 11/25/2023   ALT 32 11/25/2023   ANIONGAP 11 11/25/2023   Last lipids Lab Results  Component Value Date   CHOL 81 01/19/2023   HDL 34 (L) 01/19/2023   LDLCALC 29 01/19/2023   TRIG 89 01/19/2023   CHOLHDL 2.4 01/19/2023   Last hemoglobin A1c Lab Results  Component Value Date   HGBA1C 9.4 (H) 10/17/2023   Last thyroid functions Lab Results  Component Value Date   TSH 1.890 12/02/2022   Last vitamin D Lab Results  Component Value Date   VD25OH 30.3 10/17/2023   Last vitamin B12 and Folate Lab Results  Component Value Date   VITAMINB12 381 12/02/2022   FOLATE 9.7 12/02/2022     Assessment & Plan:   Problem List Items Addressed This Visit       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HCC)   Return to care today for diabetes follow-up.  A1c increased to 9.4 on labs from October 2024.  Lantus 10 units nightly was added at his last appointment.  He is additionally prescribed metformin 1000 mg twice daily.  He previously experienced adverse side effects with Ozempic.  He checks his blood sugar  regularly and reports morning readings ranging 150-160. -Repeat A1c ordered today -We discussed increasing Lantus to 14 units  nightly for improved diabetes control.  Goal AM blood sugar reading is 90-130.  Okay to increase Lantus in increments of 2 units if blood sugar remains elevated. -We will tentatively plan for follow-up in 3 months      Paratracheal lymphadenopathy   CT soft tissue neck from 1/23 showed stable appearance of tracheoesophageal groove lesion.  Benign etiology favored.  Repeat neck CT recommended for January 2026.      Chronic thoracic back pain - Primary   His acute concern today remains chronic bilateral thoracic back pain and atypical chest pain.  Previously evaluated by cardiology with unremarkable cardiac workup.  CT of the chest did not show any concern for her aneurysm/dissection.  Pain is worse with activity.  Cardiology follow-up scheduled for next month.  Suspect musculoskeletal etiology.  Will add Flexeril for as needed pain relief.      Return in about 3 months (around 06/01/2024).   Billie Lade, MD

## 2024-03-01 NOTE — Assessment & Plan Note (Signed)
 Return to care today for diabetes follow-up.  A1c increased to 9.4 on labs from October 2024.  Lantus 10 units nightly was added at his last appointment.  He is additionally prescribed metformin 1000 mg twice daily.  He previously experienced adverse side effects with Ozempic.  He checks his blood sugar regularly and reports morning readings ranging 150-160. -Repeat A1c ordered today -We discussed increasing Lantus to 14 units nightly for improved diabetes control.  Goal AM blood sugar reading is 90-130.  Okay to increase Lantus in increments of 2 units if blood sugar remains elevated. -We will tentatively plan for follow-up in 3 months

## 2024-03-01 NOTE — Assessment & Plan Note (Signed)
 His acute concern today remains chronic bilateral thoracic back pain and atypical chest pain.  Previously evaluated by cardiology with unremarkable cardiac workup.  CT of the chest did not show any concern for her aneurysm/dissection.  Pain is worse with activity.  Cardiology follow-up scheduled for next month.  Suspect musculoskeletal etiology.  Will add Flexeril for as needed pain relief.

## 2024-03-01 NOTE — Assessment & Plan Note (Signed)
 CT soft tissue neck from 1/23 showed stable appearance of tracheoesophageal groove lesion.  Benign etiology favored.  Repeat neck CT recommended for January 2026.

## 2024-03-01 NOTE — Patient Instructions (Signed)
 It was a pleasure to see you today.  Thank you for giving [place] the opportunity to be involved in your care.  Below is a brief recap of your visit and next steps.  We will plan to see you again in 3 months.  Summary Increase lantus to 14 units nightly. OK to further increase in increments of 2 units at a time if morning blood sugar readings remain above 130 Repeat labs ordered today Add flexeril for relief of back pain

## 2024-03-06 DIAGNOSIS — Z419 Encounter for procedure for purposes other than remedying health state, unspecified: Secondary | ICD-10-CM | POA: Diagnosis not present

## 2024-03-11 DIAGNOSIS — H35033 Hypertensive retinopathy, bilateral: Secondary | ICD-10-CM | POA: Diagnosis not present

## 2024-03-11 DIAGNOSIS — E119 Type 2 diabetes mellitus without complications: Secondary | ICD-10-CM | POA: Diagnosis not present

## 2024-03-11 DIAGNOSIS — Z961 Presence of intraocular lens: Secondary | ICD-10-CM | POA: Diagnosis not present

## 2024-04-06 DIAGNOSIS — Z419 Encounter for procedure for purposes other than remedying health state, unspecified: Secondary | ICD-10-CM | POA: Diagnosis not present

## 2024-04-18 ENCOUNTER — Ambulatory Visit: Payer: Medicaid Other | Attending: Cardiology | Admitting: Cardiology

## 2024-04-18 ENCOUNTER — Encounter: Payer: Self-pay | Admitting: Cardiology

## 2024-04-18 VITALS — BP 128/74 | HR 95 | Ht 71.0 in | Wt 185.8 lb

## 2024-04-18 DIAGNOSIS — R002 Palpitations: Secondary | ICD-10-CM

## 2024-04-18 DIAGNOSIS — R079 Chest pain, unspecified: Secondary | ICD-10-CM

## 2024-04-18 DIAGNOSIS — E782 Mixed hyperlipidemia: Secondary | ICD-10-CM | POA: Diagnosis not present

## 2024-04-18 DIAGNOSIS — I10 Essential (primary) hypertension: Secondary | ICD-10-CM

## 2024-04-18 DIAGNOSIS — Z8673 Personal history of transient ischemic attack (TIA), and cerebral infarction without residual deficits: Secondary | ICD-10-CM | POA: Diagnosis not present

## 2024-04-18 NOTE — Patient Instructions (Signed)

## 2024-04-18 NOTE — Progress Notes (Signed)
 Cardiology Office Note  Date: 04/18/2024   ID: Robert Lyons, DOB 1961-02-19, MRN [id number]  History of Present Illness: Robert Lyons is a 63 y.o. male last seen in December 2024 by Ms. Clementine Cutting NP, I reviewed her note.  Our last visit was in 2022.  He is here for a follow-up visit.  He has a history of recurrent thoracic discomfort over the years, no specific ischemic or arrhythmogenic etiology uncovered.  He states that he feels a dull ache in his left lower chest without provocation that can last for a few days at a time, not worse with activity, and not associated with palpitations or syncope.  Most recently he wore a cardiac monitor in December 2024 as noted below.  No definite correlation with arrhythmia.  We went over his medications.  He reports compliance with therapy.  He continues to follow with Dr. Kermit Ped for primary care.  I reviewed his interval lab work.  I reviewed his ECG today which shows sinus tachycardia at 108 bpm.  Physical Exam: VS:  BP 128/74   Pulse 95   Ht 5\' 11"  (1.803 m)   Wt 185 lb 12.8 oz (84.3 kg)   SpO2 96%   BMI 25.91 kg/m , BMI Body mass index is 25.91 kg/m.  Wt Readings from Last 3 Encounters:  04/18/24 185 lb 12.8 oz (84.3 kg)  03/01/24 187 lb 9.6 oz (85.1 kg)  01/18/24 187 lb 12.8 oz (85.2 kg)    General: Patient appears comfortable at rest. HEENT: Conjunctiva and lids normal. Neck: Supple, no elevated JVP or carotid bruits. Lungs: Clear to auscultation, nonlabored breathing at rest. Cardiac: Regular rate and rhythm, no S3 or significant systolic murmur, no pericardial rub. Extremities: No pitting edema.  ECG:  An ECG dated 12/07/2023 was personally reviewed today and demonstrated:  Sinus rhythm with increased voltage, Q-wave in lead III.  Labwork: 11/25/2023: ALT 32; AST 36; BUN 10; Hemoglobin 12.1; Platelets 222; Potassium 3.8; Sodium 139 01/18/2024: Creatinine, Ser 1.30     Component Value Date/Time   CHOL 81 01/19/2023 0441    CHOL 189 12/02/2022 1538   TRIG 89 01/19/2023 0441   HDL 34 (L) 01/19/2023 0441   HDL 47 12/02/2022 1538   CHOLHDL 2.4 01/19/2023 0441   VLDL 18 01/19/2023 0441   LDLCALC 29 01/19/2023 0441   LDLCALC 99 12/02/2022 1538   Other Studies Reviewed Today:  Cardiac monitor December 2024: ZIO monitor reviewed.  14 days analyzed.   Predominant rhythm is sinus with heart rate ranging from 59 bpm up to 139 bpm and average heart rate 77 bpm. There were rare PACs including atrial couplets and triplets representing less than 1% total beats.  Brief 4 beat run of SVT noted. There were rare PVCs including ventricular couplets representing less than 1% total beats. No sustained arrhythmias. No pauses or high degree heart block.  Assessment and Plan:  1.  History of chest pain and palpitations as discussed above, no specific correlation to activity and with reassuring workup over time..  Lexiscan  Myoview  in 2022 was negative for ischemia.  Echocardiogram in January 2024 revealed LVEF of 55 to 60% without regional wall motion abnormalities or significant valvular abnormalities.  Cardiac monitor in December 2024 showed rare PACs and PVCs with only a few very brief episodes of SVT.  No atrial fibrillation documented at that time.  Continue observation and concurrent risk factor modification.  2.  Primary hypertension.  Pressure control adequate today.  Continue Norvasc   5 mg daily, lisinopril  40 mg daily, and Lopressor  50 mg twice daily.  3.  History of stroke (right aspect of pons by brain MRI) in January 2024.  No arrhythmia or significant obstructive carotid artery disease noted at the time.  Continue aspirin  81 mg daily and Lipitor 80 mg daily.  4.  Nonobstructive bilateral ICA stenosis, less than 50% by carotid Dopplers in January 2024.  Disposition:  Follow up  1 year.  Signed, Gerard Knight, M.D., F.A.C.C. [facility]

## 2024-05-06 DIAGNOSIS — Z419 Encounter for procedure for purposes other than remedying health state, unspecified: Secondary | ICD-10-CM | POA: Diagnosis not present

## 2024-05-24 ENCOUNTER — Encounter: Payer: Self-pay | Admitting: Podiatry

## 2024-05-24 ENCOUNTER — Ambulatory Visit: Payer: Medicaid Other | Admitting: Podiatry

## 2024-05-24 DIAGNOSIS — E119 Type 2 diabetes mellitus without complications: Secondary | ICD-10-CM | POA: Diagnosis not present

## 2024-05-24 DIAGNOSIS — M79675 Pain in left toe(s): Secondary | ICD-10-CM | POA: Diagnosis not present

## 2024-05-24 DIAGNOSIS — B351 Tinea unguium: Secondary | ICD-10-CM | POA: Diagnosis not present

## 2024-05-24 DIAGNOSIS — M79674 Pain in right toe(s): Secondary | ICD-10-CM

## 2024-05-24 NOTE — Progress Notes (Signed)
 This patient returns to my office for at risk foot care.  This patient requires this care by a professional since this patient will be at risk due to having  diabetes.  This patient is unable to cut nails himself since the patient cannot reach his nails.These nails are painful walking and wearing shoes.  This patient presents for at risk foot care today.  General Appearance  Alert, conversant and in no acute stress.  Vascular  Dorsalis pedis and posterior tibial  pulses are palpable  bilaterally.  Capillary return is within normal limits  bilaterally. Temperature is within normal limits  bilaterally.  Neurologic  Senn-Weinstein monofilament wire test within normal limits  bilaterally. Muscle power within normal limits bilaterally.  Nails Thick disfigured discolored nails with subungual debris  from hallux to fifth toes bilaterally. No evidence of bacterial infection or drainage bilaterally.  Orthopedic  No limitations of motion  feet .  No crepitus or effusions noted.  No bony pathology or digital deformities noted.  Skin  normotropic skin with no porokeratosis noted bilaterally.  No signs of infections or ulcers noted.     Onychomycosis  Pain in right toes  Pain in left toes  Consent was obtained for treatment procedures.   Mechanical debridement of nails 1-5  bilaterally performed with a nail nipper.  Filed with dremel without incident.    Return office visit    4  months                  Told patient to return for periodic foot care and evaluation due to potential at risk complications.   Helane Gunther DPM

## 2024-06-03 ENCOUNTER — Ambulatory Visit: Admitting: Internal Medicine

## 2024-06-04 ENCOUNTER — Ambulatory Visit (INDEPENDENT_AMBULATORY_CARE_PROVIDER_SITE_OTHER): Admitting: Internal Medicine

## 2024-06-04 ENCOUNTER — Encounter: Payer: Self-pay | Admitting: Internal Medicine

## 2024-06-04 ENCOUNTER — Other Ambulatory Visit: Payer: Self-pay | Admitting: Internal Medicine

## 2024-06-04 VITALS — BP 129/76 | HR 70 | Ht 71.0 in | Wt 185.6 lb

## 2024-06-04 DIAGNOSIS — M546 Pain in thoracic spine: Secondary | ICD-10-CM | POA: Diagnosis not present

## 2024-06-04 DIAGNOSIS — G8929 Other chronic pain: Secondary | ICD-10-CM

## 2024-06-04 DIAGNOSIS — I10 Essential (primary) hypertension: Secondary | ICD-10-CM | POA: Diagnosis not present

## 2024-06-04 DIAGNOSIS — E559 Vitamin D deficiency, unspecified: Secondary | ICD-10-CM

## 2024-06-04 DIAGNOSIS — R590 Localized enlarged lymph nodes: Secondary | ICD-10-CM

## 2024-06-04 DIAGNOSIS — Z794 Long term (current) use of insulin: Secondary | ICD-10-CM

## 2024-06-04 DIAGNOSIS — K219 Gastro-esophageal reflux disease without esophagitis: Secondary | ICD-10-CM

## 2024-06-04 DIAGNOSIS — E785 Hyperlipidemia, unspecified: Secondary | ICD-10-CM

## 2024-06-04 DIAGNOSIS — E1136 Type 2 diabetes mellitus with diabetic cataract: Secondary | ICD-10-CM

## 2024-06-04 MED ORDER — METHOCARBAMOL 500 MG PO TABS: 500.0000 mg | ORAL_TABLET | Freq: Four times a day (QID) | ORAL | 1 refills | Status: DC | PRN

## 2024-06-04 NOTE — Assessment & Plan Note (Signed)
 Flexeril  was added for as needed relief of chronic thoracic back pain at his last appointment.  He states that this has been modestly effective.  On exam he endorses tenderness with palpation of the paraspinal muscles of the thoracic region bilaterally.  He would like to switch muscle relaxers.  Add Robaxin  every 6 hours as needed for severe pain relief.  Flexeril  discontinued.

## 2024-06-04 NOTE — Assessment & Plan Note (Signed)
 Lipid panel last updated in January 2024.  Total cholesterol 81 and LDL 29.  He is currently prescribed atorvastatin  80 mg daily.  Repeat lipid panel ordered today.

## 2024-06-04 NOTE — Assessment & Plan Note (Signed)
 CT soft tissue neck from January 2025 showed stable appearance of tracheoesophageal groove lesion.  Benign etiology favored.  Repeat neck CT recommended for January 2026.

## 2024-06-04 NOTE — Patient Instructions (Signed)
 It was a pleasure to see you today.  Thank you for giving us  the opportunity to be involved in your care.  Below is a brief recap of your visit and next steps.  We will plan to see you again in 3 months.  Summary Discontinue flexeril . Try Robaxin  for as needed relief of back pain Repeat labs ordered Follow up in 3 months

## 2024-06-04 NOTE — Assessment & Plan Note (Signed)
 A1c 9.4 on labs from October 2024.  Lantus  was increased to 14 units nightly at his last appointment.  He is additionally prescribed metformin  1000 mg twice daily.  He has previously experienced adverse side effects with Ozempic .  Repeat A1c ordered today.  Consider addition of SGLT2i if diabetes remains poorly controlled.

## 2024-06-04 NOTE — Assessment & Plan Note (Signed)
 Remains adequately controlled on current antihypertensive regimen.

## 2024-06-04 NOTE — Progress Notes (Signed)
 Established Patient Office Visit  Subjective   Patient ID: Robert Lyons, male    DOB: [birth year]  Age: 63 y.o. MRN: [id number]  Chief Complaint  Patient presents with   Diabetes    Three month follow up    Muscle Pain    Muscle pain at night , does not feel medication is working   Robert Lyons returns to care today for routine follow-up.  He was last evaluated by me on 3/7.  Lantus  was increased to 14 units nightly for improved management of diabetes mellitus.  Flexeril  was also added for as needed relief of chronic thoracic back pain.  85-month follow-up was arranged for reassessment.  In the interim he has been seen by cardiology, optometry, and podiatry for follow-up.  Today he reports feeling fairly well.  He continues to endorse chronic thoracic back pain.  Flexeril  has been modestly effective.  He is interested in switching to a different muscle relaxer.  He does not have any additional concerns to discuss.  Past Medical History:  Diagnosis Date   GERD (gastroesophageal reflux disease)    Hypertension    Pneumonia    Stroke [facility])    Type 2 diabetes mellitus (HCC)    Past Surgical History:  Procedure Laterality Date   BIOPSY  07/03/2023   Procedure: BIOPSY;  Surgeon: Vinetta Greening, DO;  Location: [facility];  Service: Endoscopy;;   ESOPHAGOGASTRODUODENOSCOPY (EGD) WITH PROPOFOL  N/A 07/03/2023   Procedure: ESOPHAGOGASTRODUODENOSCOPY (EGD) WITH PROPOFOL ;  Surgeon: Vinetta Greening, DO;  Location: [facility];  Service: Endoscopy;  Laterality: N/A;  10:30 AM, ASA 3   No prior surgery     Social History   Tobacco Use   Smoking status: Former    Current packs/day: 0.00    Average packs/day: 1 pack/day for 15.0 years (15.0 ttl pk-yrs)    Types: Cigarettes    Start date: 01/18/2008    Quit date: 01/17/2023    Years since quitting: 1.3   Smokeless tobacco: Never  Vaping Use   Vaping status: Never Used  Substance Use Topics   Alcohol use: No    Comment: none since  2014   Drug use: No   Family History  Problem Relation Age of Onset   Diabetes Mother    Hypertension Mother    Diabetes Maternal Aunt    Diabetes Maternal Uncle    No Known Allergies  Review of Systems  Constitutional:  Negative for chills and fever.  HENT:  Negative for sore throat.   Respiratory:  Negative for cough and shortness of breath.   Cardiovascular:  Negative for chest pain, palpitations and leg swelling.  Gastrointestinal:  Negative for abdominal pain, blood in stool, constipation, diarrhea, nausea and vomiting.  Genitourinary:  Negative for dysuria and hematuria.  Musculoskeletal:  Positive for back pain (Chronic thoracic back pain). Negative for myalgias.  Skin:  Negative for itching and rash.  Neurological:  Negative for dizziness and headaches.  Psychiatric/Behavioral:  Negative for depression and suicidal ideas.      Objective:     BP 129/76   Pulse 70   Ht 5\' 11"  (1.803 m)   Wt 185 lb 9.6 oz (84.2 kg)   SpO2 97%   BMI 25.89 kg/m  BP Readings from Last 3 Encounters:  06/04/24 129/76  04/18/24 128/74  03/01/24 131/75   Physical Exam Vitals reviewed.  Constitutional:      General: He is not in acute distress.    Appearance:  Normal appearance. He is not ill-appearing.  HENT:     Head: Normocephalic and atraumatic.     Right Ear: External ear normal.     Left Ear: External ear normal.     Nose: Nose normal. No congestion or rhinorrhea.     Mouth/Throat:     Mouth: Mucous membranes are moist.     Pharynx: Oropharynx is clear.  Eyes:     General: No scleral icterus.    Extraocular Movements: Extraocular movements intact.     Conjunctiva/sclera: Conjunctivae normal.     Pupils: Pupils are equal, round, and reactive to light.  Cardiovascular:     Rate and Rhythm: Normal rate and regular rhythm.     Pulses: Normal pulses.     Heart sounds: Normal heart sounds. No murmur heard. Pulmonary:     Effort: Pulmonary effort is normal.     Breath  sounds: Normal breath sounds. No wheezing, rhonchi or rales.  Abdominal:     General: Abdomen is flat. Bowel sounds are normal. There is no distension.     Palpations: Abdomen is soft.     Tenderness: There is abdominal tenderness (Paraspinal muscles of the thoracic spine bilaterally).  Musculoskeletal:        General: No swelling or deformity.     Cervical back: Normal range of motion.  Skin:    General: Skin is warm and dry.     Capillary Refill: Capillary refill takes less than 2 seconds.  Neurological:     General: No focal deficit present.     Mental Status: He is alert and oriented to person, place, and time.     Motor: No weakness.     Gait: Gait abnormal (ambulates with a cane).  Psychiatric:        Mood and Affect: Mood normal.        Behavior: Behavior normal.        Thought Content: Thought content normal.   Last CBC Lab Results  Component Value Date   WBC 6.6 11/25/2023   HGB 12.1 (L) 11/25/2023   HCT 35.7 (L) 11/25/2023   MCV 91.3 11/25/2023   MCH 30.9 11/25/2023   RDW 11.9 11/25/2023   PLT 222 11/25/2023   Last metabolic panel Lab Results  Component Value Date   GLUCOSE 225 (H) 11/25/2023   NA 139 11/25/2023   K 3.8 11/25/2023   CL 103 11/25/2023   CO2 25 11/25/2023   BUN 10 11/25/2023   CREATININE 1.30 (H) 01/18/2024   GFRNONAA >60 11/25/2023   CALCIUM  9.4 11/25/2023   PROT 7.5 11/25/2023   ALBUMIN 4.1 11/25/2023   LABGLOB 2.5 12/02/2022   AGRATIO 1.9 12/02/2022   BILITOT 0.7 11/25/2023   ALKPHOS 91 11/25/2023   AST 36 11/25/2023   ALT 32 11/25/2023   ANIONGAP 11 11/25/2023   Last lipids Lab Results  Component Value Date   CHOL 81 01/19/2023   HDL 34 (L) 01/19/2023   LDLCALC 29 01/19/2023   TRIG 89 01/19/2023   CHOLHDL 2.4 01/19/2023   Last hemoglobin A1c Lab Results  Component Value Date   HGBA1C 9.4 (H) 10/17/2023   Last thyroid  functions Lab Results  Component Value Date   TSH 1.890 12/02/2022   Last vitamin D  Lab Results   Component Value Date   VD25OH 30.3 10/17/2023   Last vitamin B12 and Folate Lab Results  Component Value Date   VITAMINB12 381 12/02/2022   FOLATE 9.7 12/02/2022     Assessment & Plan:  Problem List Items Addressed This Visit       Essential hypertension - Primary   Remains adequately controlled on current antihypertensive regimen.      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HCC)   A1c 9.4 on labs from October 2024.  Lantus  was increased to 14 units nightly at his last appointment.  He is additionally prescribed metformin  1000 mg twice daily.  He has previously experienced adverse side effects with Ozempic .  Repeat A1c ordered today.  Consider addition of SGLT2i if diabetes remains poorly controlled.      Paratracheal lymphadenopathy   CT soft tissue neck from January 2025 showed stable appearance of tracheoesophageal groove lesion.  Benign etiology favored.  Repeat neck CT recommended for January 2026.      Hyperlipidemia   Lipid panel last updated in January 2024.  Total cholesterol 81 and LDL 29.  He is currently prescribed atorvastatin  80 mg daily.  Repeat lipid panel ordered today.      Chronic thoracic back pain   Flexeril  was added for as needed relief of chronic thoracic back pain at his last appointment.  He states that this has been modestly effective.  On exam he endorses tenderness with palpation of the paraspinal muscles of the thoracic region bilaterally.  He would like to switch muscle relaxers.  Add Robaxin  every 6 hours as needed for severe pain relief.  Flexeril  discontinued.      Return in about 3 months (around 09/04/2024).   Tobi Fortes, MD

## 2024-06-05 ENCOUNTER — Other Ambulatory Visit (HOSPITAL_COMMUNITY): Payer: Self-pay

## 2024-06-05 ENCOUNTER — Telehealth: Payer: Self-pay | Admitting: Pharmacy Technician

## 2024-06-05 ENCOUNTER — Ambulatory Visit: Payer: Self-pay | Admitting: Internal Medicine

## 2024-06-05 DIAGNOSIS — E1136 Type 2 diabetes mellitus with diabetic cataract: Secondary | ICD-10-CM

## 2024-06-05 LAB — CMP14+EGFR
ALT: 28 IU/L (ref 0–44)
AST: 31 IU/L (ref 0–40)
Albumin: 4.4 g/dL (ref 3.9–4.9)
Alkaline Phosphatase: 120 IU/L (ref 44–121)
BUN/Creatinine Ratio: 7 — ABNORMAL LOW (ref 10–24)
BUN: 8 mg/dL (ref 8–27)
Bilirubin Total: 0.8 mg/dL (ref 0.0–1.2)
CO2: 20 mmol/L (ref 20–29)
Calcium: 10.1 mg/dL (ref 8.6–10.2)
Chloride: 100 mmol/L (ref 96–106)
Creatinine, Ser: 1.07 mg/dL (ref 0.76–1.27)
Globulin, Total: 2.9 g/dL (ref 1.5–4.5)
Glucose: 349 mg/dL — ABNORMAL HIGH (ref 70–99)
Potassium: 4.6 mmol/L (ref 3.5–5.2)
Sodium: 137 mmol/L (ref 134–144)
Total Protein: 7.3 g/dL (ref 6.0–8.5)
eGFR: 77 mL/min/{1.73_m2} (ref >59)

## 2024-06-05 LAB — B12 AND FOLATE PANEL
Folate: 19.5 ng/mL (ref >3.0)
Vitamin B-12: 353 pg/mL (ref 232–1245)

## 2024-06-05 LAB — CBC WITH DIFFERENTIAL/PLATELET
Basophils Absolute: 0 10*3/uL (ref 0.0–0.2)
Basos: 1 %
EOS (ABSOLUTE): 0.1 10*3/uL (ref 0.0–0.4)
Eos: 2 %
Hematocrit: 40.6 % (ref 37.5–51.0)
Hemoglobin: 13.3 g/dL (ref 13.0–17.7)
Immature Grans (Abs): 0 10*3/uL (ref 0.0–0.1)
Immature Granulocytes: 0 %
Lymphocytes Absolute: 2 10*3/uL (ref 0.7–3.1)
Lymphs: 33 %
MCH: 31.1 pg (ref 26.6–33.0)
MCHC: 32.8 g/dL (ref 31.5–35.7)
MCV: 95 fL (ref 79–97)
Monocytes Absolute: 0.7 10*3/uL (ref 0.1–0.9)
Monocytes: 11 %
Neutrophils Absolute: 3.2 10*3/uL (ref 1.4–7.0)
Neutrophils: 53 %
Platelets: 271 10*3/uL (ref 150–450)
RBC: 4.28 x10E6/uL (ref 4.14–5.80)
RDW: 12.6 % (ref 11.6–15.4)
WBC: 6.1 10*3/uL (ref 3.4–10.8)

## 2024-06-05 LAB — LIPID PANEL
Chol/HDL Ratio: 2.8 ratio (ref 0.0–5.0)
Cholesterol, Total: 157 mg/dL (ref 100–199)
HDL: 57 mg/dL (ref >39)
LDL Chol Calc (NIH): 75 mg/dL (ref 0–99)
Triglycerides: 142 mg/dL (ref 0–149)
VLDL Cholesterol Cal: 25 mg/dL (ref 5–40)

## 2024-06-05 LAB — VITAMIN D 25 HYDROXY (VIT D DEFICIENCY, FRACTURES): Vit D, 25-Hydroxy: 21 ng/mL — ABNORMAL LOW (ref 30.0–100.0)

## 2024-06-05 LAB — HEMOGLOBIN A1C
Est. average glucose Bld gHb Est-mCnc: 283 mg/dL
Hgb A1c MFr Bld: 11.5 % — ABNORMAL HIGH (ref 4.8–5.6)

## 2024-06-05 LAB — TSH+FREE T4
Free T4: 1.26 ng/dL (ref 0.82–1.77)
TSH: 6.45 u[IU]/mL — ABNORMAL HIGH (ref 0.450–4.500)

## 2024-06-05 MED ORDER — TIRZEPATIDE 2.5 MG/0.5ML ~~LOC~~ SOAJ: 2.5000 mg | SUBCUTANEOUS | 0 refills | Status: DC

## 2024-06-05 MED ORDER — TRULICITY 0.75 MG/0.5ML ~~LOC~~ SOAJ: 0.7500 mg | SUBCUTANEOUS | 0 refills | Status: AC

## 2024-06-05 NOTE — Telephone Encounter (Signed)
 Number not in service.

## 2024-06-05 NOTE — Telephone Encounter (Signed)
 Pharmacy Patient Advocate Encounter   Received notification from CoverMyMeds that prior authorization for Mounjaro  2.5MG /0.5ML auto-injectors is required/requested.   Insurance verification completed.   The patient is insured through [facility] [place] .   Per test claim:  SEE BELOW is preferred by the insurance.  If suggested medication is appropriate, Please send in a new RX and discontinue this one. If not, please advise as to why it's not appropriate so that we may request a Prior Authorization. Please note, some preferred medications may still require a PA.  If the suggested medications have not been trialed and there are no contraindications to their use, the PA will not be submitted, as it will not be approved.  Medicaid will require a trial/failure of 2 preferred drugs. I only see a documented intolerance to Ozempic . Please advise.

## 2024-06-06 ENCOUNTER — Telehealth: Payer: Self-pay

## 2024-06-06 ENCOUNTER — Other Ambulatory Visit (HOSPITAL_COMMUNITY): Payer: Self-pay

## 2024-06-06 ENCOUNTER — Other Ambulatory Visit: Payer: Self-pay

## 2024-06-06 ENCOUNTER — Telehealth: Payer: Self-pay | Admitting: Pharmacy Technician

## 2024-06-06 DIAGNOSIS — E1136 Type 2 diabetes mellitus with diabetic cataract: Secondary | ICD-10-CM

## 2024-06-06 DIAGNOSIS — Z419 Encounter for procedure for purposes other than remedying health state, unspecified: Secondary | ICD-10-CM | POA: Diagnosis not present

## 2024-06-06 MED ORDER — ACCU-CHEK GUIDE TEST VI STRP: ORAL_STRIP | 12 refills | Status: DC

## 2024-06-06 NOTE — Telephone Encounter (Signed)
 Pharmacy Patient Advocate Encounter   Received notification from Onbase that prior authorization for Trulicity 0.75MG /0.5ML auto-injectors is required/requested.   Insurance verification completed.   The patient is insured through [facility] [place] .   Per test claim: PA required; PA submitted to above mentioned insurance via CoverMyMeds Key/confirmation #/EOC [facility] Status is pending

## 2024-06-06 NOTE — Telephone Encounter (Signed)
 Pharmacy Patient Advocate Encounter  Received notification from [facility] [employer] that Prior Authorization for Trulicity 0.75MG /0.5ML auto-injectors has been APPROVED from 06/06/2024 to 06/06/2025. Unable to obtain price due to refill too soon rejection, last fill date 06/06/2024 next available fill date07/08/2024.   PA #/Case ID/Reference #: [id number]

## 2024-06-06 NOTE — Telephone Encounter (Signed)
 Pt requests refill of Accu-chek test strips. RN unable to pend, Epic states a new order is needed.

## 2024-06-06 NOTE — Telephone Encounter (Unsigned)
 Copied from CRM [id number]. Topic: Clinical - Medication Refill >> Jun 06, 2024  9:21 AM Crispin Dolphin wrote: Medication: glucose blood (ACCU-CHEK GUIDE) test strip  Has the patient contacted their pharmacy? No - was seen yesterday forgot to mention to provider (Agent: If no, request that the patient contact the pharmacy for the refill. If patient does not wish to contact the pharmacy document the reason why and proceed with request.) (Agent: If yes, when and what did the pharmacy advise?)  This is the patient's preferred pharmacy:  [facility] [place], [place] - [address] [place] [address] Phone: [phone] Fax: [phone]  Is this the correct pharmacy for this prescription? No If no, delete pharmacy and type the correct one.   Has the prescription been filled recently? No  Is the patient out of the medication? Yes  Has the patient been seen for an appointment in the last year OR does the patient have an upcoming appointment? Yes - yesterday  Can we respond through MyChart? No  Agent: Please be advised that Rx refills may take up to 3 business days. We ask that you follow-up with your pharmacy.

## 2024-07-06 DIAGNOSIS — Z419 Encounter for procedure for purposes other than remedying health state, unspecified: Secondary | ICD-10-CM | POA: Diagnosis not present

## 2024-08-06 DIAGNOSIS — Z419 Encounter for procedure for purposes other than remedying health state, unspecified: Secondary | ICD-10-CM | POA: Diagnosis not present

## 2024-08-23 ENCOUNTER — Encounter: Payer: Self-pay | Admitting: Podiatry

## 2024-08-23 ENCOUNTER — Ambulatory Visit (INDEPENDENT_AMBULATORY_CARE_PROVIDER_SITE_OTHER): Admitting: Podiatry

## 2024-08-23 DIAGNOSIS — M79674 Pain in right toe(s): Secondary | ICD-10-CM | POA: Diagnosis not present

## 2024-08-23 DIAGNOSIS — B351 Tinea unguium: Secondary | ICD-10-CM

## 2024-08-23 DIAGNOSIS — E119 Type 2 diabetes mellitus without complications: Secondary | ICD-10-CM

## 2024-08-23 DIAGNOSIS — M79675 Pain in left toe(s): Secondary | ICD-10-CM

## 2024-08-23 NOTE — Progress Notes (Signed)
 This patient returns to my office for at risk foot care.  This patient requires this care by a professional since this patient will be at risk due to having diabetes.This patient is unable to cut nails himself since the patient cannot reach his nails.These nails are painful walking and wearing shoes.  This patient presents for at risk foot care today.  General Appearance  Alert, conversant and in no acute stress.  Vascular  Dorsalis pedis and posterior tibial  pulses are palpable  bilaterally.  Capillary return is within normal limits  bilaterally. Temperature is within normal limits  bilaterally.  Neurologic  Senn-Weinstein monofilament wire test within normal limits  bilaterally. Muscle power within normal limits bilaterally.  Nails Thick disfigured discolored nails with subungual debris  from hallux to fifth toes bilaterally. No evidence of bacterial infection or drainage bilaterally.  Orthopedic  No limitations of motion  feet .  No crepitus or effusions noted.  No bony pathology or digital deformities noted.  Skin  normotropic skin with no porokeratosis noted bilaterally.  No signs of infections or ulcers noted.     Onychomycosis  Pain in right toes  Pain in left toes  Consent was obtained for treatment procedures.   Mechanical debridement of nails 1-5  bilaterally performed with a nail nipper.  Filed with dremel without incident.    Return office visit   3  months                   Told patient to return for periodic foot care and evaluation due to potential at risk complications.   Cordella Bold DPM  tomma

## 2024-09-04 ENCOUNTER — Other Ambulatory Visit (HOSPITAL_COMMUNITY): Payer: Self-pay

## 2024-09-04 ENCOUNTER — Other Ambulatory Visit: Payer: Self-pay | Admitting: Internal Medicine

## 2024-09-04 ENCOUNTER — Ambulatory Visit (INDEPENDENT_AMBULATORY_CARE_PROVIDER_SITE_OTHER)

## 2024-09-04 VITALS — BP 127/79 | HR 71 | Ht 71.0 in | Wt 189.0 lb

## 2024-09-04 DIAGNOSIS — Z794 Long term (current) use of insulin: Secondary | ICD-10-CM

## 2024-09-04 DIAGNOSIS — R7989 Other specified abnormal findings of blood chemistry: Secondary | ICD-10-CM

## 2024-09-04 DIAGNOSIS — E785 Hyperlipidemia, unspecified: Secondary | ICD-10-CM | POA: Diagnosis not present

## 2024-09-04 DIAGNOSIS — Z23 Encounter for immunization: Secondary | ICD-10-CM | POA: Diagnosis not present

## 2024-09-04 DIAGNOSIS — E1136 Type 2 diabetes mellitus with diabetic cataract: Secondary | ICD-10-CM

## 2024-09-04 MED ORDER — FREESTYLE LIBRE 3 READER DEVI
1.0000 | Freq: Every day | 0 refills | Status: AC
Start: 2024-09-04 — End: ?

## 2024-09-04 MED ORDER — FREESTYLE LIBRE 3 PLUS SENSOR MISC: 2 refills | Status: AC

## 2024-09-04 MED ORDER — LANTUS SOLOSTAR 100 UNIT/ML ~~LOC~~ SOPN: 20.0000 [IU] | PEN_INJECTOR | Freq: Every day | SUBCUTANEOUS | 3 refills | Status: DC

## 2024-09-04 MED ORDER — NOVOLOG FLEXPEN 100 UNIT/ML ~~LOC~~ SOPN: 4.0000 [IU] | PEN_INJECTOR | Freq: Three times a day (TID) | SUBCUTANEOUS | 5 refills | Status: DC

## 2024-09-04 NOTE — Progress Notes (Unsigned)
 Established Patient Office Visit  Subjective   Patient ID: Robert Lyons, male    DOB: [birth year]  Age: 63 y.o. MRN: [id number]  Chief Complaint  Patient presents with   Medical Management of Chronic Issues    3 month follow up    HPI Discussed the use of AI scribe software for clinical note transcription with the patient, who gave verbal consent to proceed.  History of Present Illness   Robert Lyons is a 63 year old male with diabetes who presents for a follow-up visit.  Glycemic management and medication tolerance - Diabetes mellitus managed with Lantus  20 units daily. - Previously used Novolog , which was effective for glycemic control. - Did not initiate Mounjaro  due to concerns about gastrointestinal side effects, specifically bowel urgency, similar to those experienced with Ozempic . - Experienced an episode of bowel urgency between bedroom and bathroom, which was distressing. - Reduced oral medication intake due to difficulty managing multiple medications. - Currently using the last tube of insulin  with one box remaining.  Dietary challenges - Difficulty resisting sweets such as honey buns and cookies, which are frequently present in the home.  Dental pathology - Requires extraction of all upper teeth. - History of antibiotic use for dental issues. - Retained tooth fragments in the gums following previous extractions. - Dissatisfaction with prior dental care and considering seeking care from a different dentist.      Patient Active Problem List   Diagnosis Date Noted   Elevated TSH 09/08/2024   Chronic thoracic back pain 03/01/2024   Dry cough 10/17/2023   Itching 10/17/2023   Pain due to onychomycosis of toenails of both feet 06/14/2023   Diabetes mellitus without complication (HCC) 06/14/2023   Peripheral neuropathy 06/08/2023   Paratracheal lymphadenopathy 01/31/2023   Acute CVA (cerebrovascular accident) (HCC) 01/18/2023   Abdominal pain 01/18/2023    Chest pain 01/18/2023   Tobacco abuse 01/18/2023   Hypokalemia 01/18/2023   Vitamin D  deficiency 01/05/2023   GERD (gastroesophageal reflux disease) 12/08/2022   Cataracts, both eyes 12/08/2022   Encounter for general adult medical examination with abnormal findings 12/08/2022   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HCC) 08/15/2017   Essential hypertension 07/10/2017   Hyperlipidemia 07/10/2017   Cigarette nicotine dependence without complication 07/10/2017    ROS    Objective:     BP 127/79   Pulse 71   Ht 5' 11 (1.803 m)   Wt 189 lb (85.7 kg)   SpO2 95%   BMI 26.36 kg/m  BP Readings from Last 3 Encounters:  09/04/24 127/79  06/04/24 129/76  04/18/24 128/74   Wt Readings from Last 3 Encounters:  09/04/24 189 lb (85.7 kg)  06/04/24 185 lb 9.6 oz (84.2 kg)  04/18/24 185 lb 12.8 oz (84.3 kg)     Physical Exam Vitals and nursing note reviewed.  Constitutional:      Appearance: Normal appearance.  HENT:     Head: Normocephalic.  Eyes:     Extraocular Movements: Extraocular movements intact.     Pupils: Pupils are equal, round, and reactive to light.  Cardiovascular:     Rate and Rhythm: Normal rate and regular rhythm.  Pulmonary:     Effort: Pulmonary effort is normal.     Breath sounds: Normal breath sounds.  Musculoskeletal:     Cervical back: Normal range of motion and neck supple.  Neurological:     Mental Status: He is alert and oriented to person, place, and time.  Gait: Gait abnormal (antalgic, ambulates with cane).  Psychiatric:        Mood and Affect: Mood normal.        Thought Content: Thought content normal.      The ASCVD Risk score (Arnett DK, et al., 2019) failed to calculate for the following reasons:   Risk score cannot be calculated because patient has a medical history suggesting prior/existing ASCVD    Assessment & Plan:   Problem List Items Addressed This Visit       Endocrine   Type 2 diabetes mellitus  with ophthalmic complication (HCC) - Primary   A1c was 11.5 in June.  He declines Mounjaro  due to gastrointestinal concerns. Interested in Novolog  for improved control. Discussed continuous glucose monitoring options. - Prescribe Novolog  pen, 4 units unless blood glucose =80 mg/dL. - Send prescription for continuous glucose monitoring sensors for insurance approval. - Order A1c test.       Relevant Medications   insulin  glargine (LANTUS  SOLOSTAR) 100 UNIT/ML Solostar Pen   insulin  aspart (NOVOLOG  FLEXPEN) 100 UNIT/ML FlexPen   Continuous Glucose Sensor (FREESTYLE LIBRE 3 PLUS SENSOR) MISC   Continuous Glucose Receiver (FREESTYLE LIBRE 3 READER) DEVI   Other Relevant Orders   CMP14+EGFR (Completed)   Hemoglobin A1c (Completed)   Lipid panel (Completed)     Other   Hyperlipidemia   Check fasting labs today.        Relevant Orders   CMP14+EGFR (Completed)   Lipid panel (Completed)   Elevated TSH   Recheck thyroid  levels.       Relevant Orders   TSH + free T4 (Completed)   Other Visit Diagnoses       Need for influenza vaccination       Relevant Orders   Flu vaccine trivalent PF, 6mos and older(Flulaval,Afluria,Fluarix,Fluzone) (Completed)            No follow-ups on file.         Leita Longs, FNP

## 2024-09-05 ENCOUNTER — Other Ambulatory Visit (HOSPITAL_COMMUNITY): Payer: Self-pay

## 2024-09-05 LAB — CMP14+EGFR
ALT: 15 IU/L (ref 0–44)
AST: 24 IU/L (ref 0–40)
Albumin: 4.2 g/dL (ref 3.9–4.9)
Alkaline Phosphatase: 105 IU/L (ref 44–121)
BUN/Creatinine Ratio: 8 — ABNORMAL LOW (ref 10–24)
BUN: 9 mg/dL (ref 8–27)
Bilirubin Total: 0.8 mg/dL (ref 0.0–1.2)
CO2: 22 mmol/L (ref 20–29)
Calcium: 10 mg/dL (ref 8.6–10.2)
Chloride: 102 mmol/L (ref 96–106)
Creatinine, Ser: 1.07 mg/dL (ref 0.76–1.27)
Globulin, Total: 2.7 g/dL (ref 1.5–4.5)
Glucose: 245 mg/dL — ABNORMAL HIGH (ref 70–99)
Potassium: 4.5 mmol/L (ref 3.5–5.2)
Sodium: 141 mmol/L (ref 134–144)
Total Protein: 6.9 g/dL (ref 6.0–8.5)
eGFR: 77 mL/min/1.73 (ref >59)

## 2024-09-05 LAB — LIPID PANEL
Chol/HDL Ratio: 2.5 ratio (ref 0.0–5.0)
Cholesterol, Total: 119 mg/dL (ref 100–199)
HDL: 47 mg/dL (ref >39)
LDL Chol Calc (NIH): 48 mg/dL (ref 0–99)
Triglycerides: 138 mg/dL (ref 0–149)
VLDL Cholesterol Cal: 24 mg/dL (ref 5–40)

## 2024-09-05 LAB — TSH+FREE T4
Free T4: 1.23 ng/dL (ref 0.82–1.77)
TSH: 3.73 u[IU]/mL (ref 0.450–4.500)

## 2024-09-05 LAB — HEMOGLOBIN A1C
Est. average glucose Bld gHb Est-mCnc: 298 mg/dL
Hgb A1c MFr Bld: 12 % — ABNORMAL HIGH (ref 4.8–5.6)

## 2024-09-08 DIAGNOSIS — R7989 Other specified abnormal findings of blood chemistry: Secondary | ICD-10-CM | POA: Insufficient documentation

## 2024-09-08 NOTE — Assessment & Plan Note (Signed)
 A1c was 11.5 in June.  He declines Mounjaro  due to gastrointestinal concerns. Interested in Novolog  for improved control. Discussed continuous glucose monitoring options. - Prescribe Novolog  pen, 4 units unless blood glucose =80 mg/dL. - Send prescription for continuous glucose monitoring sensors for insurance approval. - Order A1c test.

## 2024-09-08 NOTE — Assessment & Plan Note (Signed)
Check fasting labs today. 

## 2024-09-08 NOTE — Assessment & Plan Note (Signed)
Recheck thyroid levels 

## 2024-09-09 ENCOUNTER — Other Ambulatory Visit (HOSPITAL_COMMUNITY): Payer: Self-pay

## 2024-09-09 ENCOUNTER — Telehealth: Payer: Self-pay | Admitting: Pharmacy Technician

## 2024-09-09 NOTE — Telephone Encounter (Signed)
 Pharmacy Patient Advocate Encounter  Received notification from [facility] MEDICAID that Prior Authorization for FreeStyle Libre 3 Plus Sensor has been APPROVED from 09/09/2024 to 03/08/2025. Ran test claim, Copay is $0.00. This test claim was processed through [facility]- copay amounts may vary at other pharmacies due to pharmacy/plan contracts, or as the patient moves through the different stages of their insurance plan.   PA #/Case ID/Reference #: [id number]

## 2024-09-09 NOTE — Telephone Encounter (Signed)
 Pharmacy Patient Advocate Encounter   Received notification from CoverMyMeds that prior authorization for FreeStyle Libre 3 Reader  device is required/requested.   Insurance verification completed.   The patient is insured through [facility] MEDICAID .   Per test claim: PA required; PA submitted to above mentioned insurance via Latent Key/confirmation #/EOC [facility] Status is pending

## 2024-09-09 NOTE — Telephone Encounter (Signed)
 Pharmacy Patient Advocate Encounter   Received notification from CoverMyMeds that prior authorization for FreeStyle Libre 3 Plus Sensor  is required/requested.   Insurance verification completed.   The patient is insured through [facility] MEDICAID .   Per test claim: PA required; PA submitted to above mentioned insurance via Latent Key/confirmation #/EOC [id number] Status is pending

## 2024-09-09 NOTE — Telephone Encounter (Signed)
 Pharmacy Patient Advocate Encounter  Received notification from [facility] MEDICAID that Prior Authorization for FreeStyle Libre 3 Reader device has been APPROVED from 08/26/2024 to 03/08/2025. Unable to obtain price due to refill too soon rejection, last fill date 09/09/2024 next available fill date10/06/2024.   PA #/Case ID/Reference #: [id number]

## 2024-09-20 ENCOUNTER — Telehealth: Payer: Self-pay

## 2024-09-20 ENCOUNTER — Other Ambulatory Visit: Payer: Self-pay

## 2024-09-20 MED ORDER — AMOXICILLIN 875 MG PO TABS: 875.0000 mg | ORAL_TABLET | Freq: Two times a day (BID) | ORAL | 0 refills | Status: AC

## 2024-09-20 NOTE — Telephone Encounter (Signed)
 Copied from CRM #[id number]. Topic: Clinical - Prescription Issue >> Sep 20, 2024  3:35 PM Antony RAMAN wrote: Reason for CRM: was suppose to get a rx for antibiotics from his visit 09/04/24, send to [facility] - [place], [place] - [address] [place] [address]

## 2024-09-23 NOTE — Telephone Encounter (Signed)
 Pt advised with verbal understanding

## 2024-10-06 ENCOUNTER — Other Ambulatory Visit: Payer: Self-pay

## 2024-10-06 ENCOUNTER — Encounter (HOSPITAL_COMMUNITY): Payer: Self-pay | Admitting: *Deleted

## 2024-10-06 ENCOUNTER — Emergency Department (HOSPITAL_COMMUNITY)
Admission: EM | Admit: 2024-10-06 | Discharge: 2024-10-06 | Disposition: A | Discharge status: Home / Self Care | Attending: Emergency Medicine | Admitting: Emergency Medicine

## 2024-10-06 DIAGNOSIS — Z794 Long term (current) use of insulin: Secondary | ICD-10-CM | POA: Diagnosis not present

## 2024-10-06 DIAGNOSIS — Z7982 Long term (current) use of aspirin: Secondary | ICD-10-CM | POA: Insufficient documentation

## 2024-10-06 DIAGNOSIS — H1032 Unspecified acute conjunctivitis, left eye: Secondary | ICD-10-CM | POA: Insufficient documentation

## 2024-10-06 DIAGNOSIS — H5789 Other specified disorders of eye and adnexa: Secondary | ICD-10-CM | POA: Diagnosis present

## 2024-10-06 MED ORDER — TOBRAMYCIN 0.3 % OP SOLN
1.0000 [drp] | OPHTHALMIC | Status: DC
  Administered 2024-10-06: 1 [drp] via OPHTHALMIC
  Filled 2024-10-06: qty 5

## 2024-10-06 MED ORDER — FLUORESCEIN SODIUM 1 MG OP STRP: 1.0000 | ORAL_STRIP | Freq: Once | OPHTHALMIC | Status: AC

## 2024-10-06 MED ORDER — TETRACAINE HCL 0.5 % OP SOLN
1.0000 [drp] | Freq: Once | OPHTHALMIC | Status: AC
  Administered 2024-10-06: 1 [drp] via OPHTHALMIC
  Filled 2024-10-06: qty 4

## 2024-10-06 MED ORDER — FLUORESCEIN SODIUM 1 MG OP STRP
ORAL_STRIP | OPHTHALMIC | Status: AC
  Administered 2024-10-06: 1 via OPHTHALMIC
  Filled 2024-10-06: qty 1

## 2024-10-06 NOTE — ED Triage Notes (Signed)
 Pt with redness and irritation to left eye x 2 days.  Pt states he had a mirror to break about the time redness started.  Itching to pt, pt admits to rubbing the eye. Pt states eye is watery

## 2024-10-06 NOTE — Discharge Instructions (Addendum)
 Call Dr. Milford office tomorrow to schedule evaluation.

## 2024-10-06 NOTE — ED Provider Notes (Signed)
 Robert Lyons   CSN: [id number] Arrival date & time: 10/06/24  1318     Patient presents with: Eye Problem   Robert Lyons is a 63 y.o. male.   Pt reports he has redness and irritation to his left eye.  Pt reports he noticed 2 days ago.  Pt reports no blurred vision.  Pt has had cataract surgery in the past.  Pt reports 2 days ago he broke a mirror but does not think he got in glass in his eye.  Pt denies any pain.  Pt complains of itching.  Pt has had some tearing.   The history is provided by the patient. No language interpreter was used.  Eye Problem Location:  Left eye Severity:  Mild Onset quality:  Gradual Duration:  2 days Timing:  Constant Progression:  Worsening Chronicity:  New Relieved by:  Nothing Worsened by:  Nothing Ineffective treatments:  None tried Associated symptoms: inflammation and itching   Associated symptoms: no blurred vision        Prior to Admission medications   Medication Sig Start Date End Date Taking? Authorizing Provider  Accu-Chek Softclix Lancets lancets Use as instructed 02/27/24   Melvenia Manus BRAVO, MD  acetaminophen  (TYLENOL ) 500 MG tablet Take 1,000 mg by mouth every 6 (six) hours as needed.    [provider]  amLODipine  (NORVASC ) 5 MG tablet Take 1 tablet (5 mg total) by mouth daily. 01/18/24   Melvenia Manus BRAVO, MD  aspirin  EC 81 MG tablet Take 1 tablet (81 mg total) by mouth daily with breakfast. Please take Aspirin  81 mg daily along with Plavix  75 mg daily for 21 days then after that STOP the Plavix   and continue ONLY Aspirin  81 mg daily indefinitely--for secondary stroke Prevention Patient taking differently: Take 81 mg by mouth daily with breakfast. 01/20/23   Pearlean Manus, MD  atorvastatin  (LIPITOR) 80 MG tablet Take 1 tablet (80 mg total) by mouth daily. 01/18/24 01/17/25  Melvenia Manus BRAVO, MD  Continuous Glucose Receiver (FREESTYLE LIBRE 3 READER) DEVI 1 Device  by Does not apply route daily. 09/04/24   Bevely Doffing, FNP  Continuous Glucose Sensor (FREESTYLE LIBRE 3 PLUS SENSOR) MISC Change sensor every 15 days. 09/04/24   Bevely Doffing, FNP  gabapentin  (NEURONTIN ) 300 MG capsule Take 1 capsule (300 mg total) by mouth at bedtime. 01/18/24   Melvenia Manus BRAVO, MD  glucose blood (ACCU-CHEK GUIDE TEST) test strip Use 4 times a day with Accu-chek monitor to check blood sugar levels. 06/06/24   Bevely Doffing, FNP  hydrOXYzine  (VISTARIL ) 25 MG capsule Take 1 capsule (25 mg total) by mouth every 8 (eight) hours as needed. 10/17/23   Melvenia Manus BRAVO, MD  insulin  aspart (NOVOLOG  FLEXPEN) 100 UNIT/ML FlexPen Inject 4 Units into the skin 3 (three) times daily with meals. Hold if blood sugar is 80 or less before meal. 09/04/24   Huenink, Doffing, FNP  insulin  glargine (LANTUS  SOLOSTAR) 100 UNIT/ML Solostar Pen Inject 20 Units into the skin at bedtime. 09/04/24   Bevely Doffing, FNP  ketorolac  (ACULAR ) 0.5 % ophthalmic solution  06/01/23   [provider]  Lancets (ACCU-CHEK MULTICLIX) lancets Use as instructed to test blood sugar 4 times daily. DX: E11.65 03/02/23   Melvenia Manus BRAVO, MD  lisinopril  (ZESTRIL ) 40 MG tablet Take 1 tablet (40 mg total) by mouth daily. 01/18/24   Melvenia Manus BRAVO, MD  meclizine  (ANTIVERT ) 25 MG tablet TAKE 1 TABLET  BY MOUTH 3 TIMES A DAY AS NEEDED FOR DIZZINESS. 05/01/23   Melvenia Manus BRAVO, MD  metFORMIN  (GLUCOPHAGE ) 1000 MG tablet Take 1 tablet (1,000 mg total) by mouth 2 (two) times daily with a meal. 03/01/24   Melvenia Manus BRAVO, MD  methocarbamol  (ROBAXIN ) 500 MG tablet Take 1 tablet (500 mg total) by mouth every 6 (six) hours as needed for muscle spasms. 06/04/24   Melvenia Manus BRAVO, MD  metoprolol  tartrate (LOPRESSOR ) 50 MG tablet Take 1 tablet (50 mg total) by mouth 2 (two) times daily. 01/18/24   Melvenia Manus BRAVO, MD  nitroGLYCERIN  (NITROSTAT ) 0.4 MG SL tablet Place 1 tablet (0.4 mg total) under the tongue every 5 (five) minutes as needed for  chest pain. 12/07/23   Miriam Norris, NP  NOVOFINE PEN NEEDLE 32G X 6 MM MISC USE AS DIRECTED EVERY DAY 09/04/24   Bevely Doffing, FNP  ofloxacin (OCUFLOX) 0.3 % ophthalmic solution  06/01/23   [provider]  omeprazole  (PRILOSEC) 40 MG capsule Take 1 capsule (40 mg total) by mouth daily. 01/18/24   Melvenia Manus BRAVO, MD  ondansetron  (ZOFRAN ) 4 MG tablet Take 1 tablet (4 mg total) by mouth every 8 (eight) hours as needed for nausea or vomiting. 06/02/23   Melvenia Manus BRAVO, MD  prednisoLONE acetate (PRED FORTE) 1 % ophthalmic suspension  06/01/23   [provider]  saccharomyces boulardii (FLORASTOR) 250 MG capsule Take 1 capsule (250 mg total) by mouth 2 (two) times daily. 01/18/24   Melvenia Manus BRAVO, MD    Allergies: Patient has no known allergies.    Review of Systems  Eyes:  Positive for itching. Negative for blurred vision.  All other systems reviewed and are negative.   Updated Vital Signs BP 135/69   Pulse 88   Temp 98.7 F (37.1 C) (Oral)   Resp 18   Ht 5' 11 (1.803 m)   Wt 85.7 kg   SpO2 99%   BMI 26.36 kg/m   Physical Exam Vitals and nursing Lyons reviewed.  Constitutional:      Appearance: He is well-developed.  HENT:     Head: Normocephalic.     Mouth/Throat:     Mouth: Mucous membranes are moist.  Eyes:     Extraocular Movements: Extraocular movements intact.     Pupils: Pupils are equal, round, and reactive to light.     Comments: Injected left conjunctiva,  exudate corner of eye.  Fluroscein left eye, no uptake, eyelids swabbed no foreign body.   Cardiovascular:     Rate and Rhythm: Normal rate.  Pulmonary:     Effort: Pulmonary effort is normal.  Abdominal:     General: There is no distension.  Musculoskeletal:        General: Normal range of motion.  Skin:    General: Skin is warm.  Neurological:     General: No focal deficit present.     Mental Status: He is alert and oriented to person, place, and time.  Psychiatric:        Mood and  Affect: Mood normal.     (all labs ordered are listed, but only abnormal results are displayed) Labs Reviewed - No data to display  EKG: None  Radiology: No results found.   Procedures   Medications Ordered in the ED  tobramycin (TOBREX) 0.3 % ophthalmic solution 1 drop (1 drop Left Eye Given 10/06/24 1455)  fluorescein ophthalmic strip 1 strip (1 strip Both Eyes Given by Other 10/06/24 1418)  tetracaine (PONTOCAINE) 0.5 % ophthalmic solution 1 drop (1 drop Left Eye Given by Other 10/06/24 1418)                                    Medical Decision Making Pt complains of left eye irritation.    Risk Prescription drug management. Risk Details: Pt given tobrex opth solution.  Pt advised to call Dr. Milford office tomorrow to be seen for evaluation.          Final diagnoses:  Acute conjunctivitis of left eye, unspecified acute conjunctivitis type    ED Discharge Orders     None     An After Visit Summary was printed and given to the patient.      Flint Sonny POUR, PA-C 10/06/24 1715    Towana Ozell BROCKS, MD 10/06/24 [phone]

## 2024-10-11 ENCOUNTER — Emergency Department (HOSPITAL_COMMUNITY)

## 2024-10-11 ENCOUNTER — Emergency Department (HOSPITAL_COMMUNITY)
Admission: EM | Admit: 2024-10-11 | Discharge: 2024-10-11 | Disposition: A | Discharge status: Home / Self Care | Attending: Emergency Medicine | Admitting: Emergency Medicine

## 2024-10-11 ENCOUNTER — Encounter (HOSPITAL_COMMUNITY): Payer: Self-pay

## 2024-10-11 ENCOUNTER — Other Ambulatory Visit: Payer: Self-pay

## 2024-10-11 DIAGNOSIS — Z7982 Long term (current) use of aspirin: Secondary | ICD-10-CM | POA: Insufficient documentation

## 2024-10-11 DIAGNOSIS — Z794 Long term (current) use of insulin: Secondary | ICD-10-CM | POA: Insufficient documentation

## 2024-10-11 DIAGNOSIS — R739 Hyperglycemia, unspecified: Secondary | ICD-10-CM | POA: Diagnosis not present

## 2024-10-11 DIAGNOSIS — R1084 Generalized abdominal pain: Secondary | ICD-10-CM | POA: Insufficient documentation

## 2024-10-11 LAB — COMPREHENSIVE METABOLIC PANEL WITH GFR
ALT: 18 U/L (ref 0–44)
AST: 26 U/L (ref 15–41)
Albumin: 4.5 g/dL (ref 3.5–5.0)
Alkaline Phosphatase: 99 U/L (ref 38–126)
Anion gap: 12 (ref 5–15)
BUN: 10 mg/dL (ref 8–23)
CO2: 24 mmol/L (ref 22–32)
Calcium: 9.8 mg/dL (ref 8.9–10.3)
Chloride: 98 mmol/L (ref 98–111)
Creatinine, Ser: 1.2 mg/dL (ref 0.61–1.24)
GFR, Estimated: >60 mL/min (ref >60)
Glucose, Bld: 440 mg/dL — ABNORMAL HIGH (ref 70–99)
Potassium: 4.4 mmol/L (ref 3.5–5.1)
Sodium: 135 mmol/L (ref 135–145)
Total Bilirubin: 0.5 mg/dL (ref 0.0–1.2)
Total Protein: 7.6 g/dL (ref 6.5–8.1)

## 2024-10-11 LAB — URINALYSIS, ROUTINE W REFLEX MICROSCOPIC
Bacteria, UA: NONE SEEN
Bilirubin Urine: NEGATIVE
Glucose, UA: >=500 mg/dL — AB
Hgb urine dipstick: NEGATIVE
Ketones, ur: NEGATIVE mg/dL
Leukocytes,Ua: NEGATIVE
Nitrite: NEGATIVE
Protein, ur: NEGATIVE mg/dL
Specific Gravity, Urine: 1.021 (ref 1.005–1.030)
pH: 6 (ref 5.0–8.0)

## 2024-10-11 LAB — TROPONIN T, HIGH SENSITIVITY: Troponin T High Sensitivity: <15 ng/L (ref 0–19)

## 2024-10-11 LAB — CBC
HCT: 36.2 % — ABNORMAL LOW (ref 39.0–52.0)
Hemoglobin: 12.5 g/dL — ABNORMAL LOW (ref 13.0–17.0)
MCH: 30.9 pg (ref 26.0–34.0)
MCHC: 34.5 g/dL (ref 30.0–36.0)
MCV: 89.6 fL (ref 80.0–100.0)
Platelets: 280 K/uL (ref 150–400)
RBC: 4.04 MIL/uL — ABNORMAL LOW (ref 4.22–5.81)
RDW: 11.6 % (ref 11.5–15.5)
WBC: 7.2 K/uL (ref 4.0–10.5)
nRBC: 0 % (ref 0.0–0.2)

## 2024-10-11 LAB — LIPASE, BLOOD: Lipase: 21 U/L (ref 11–51)

## 2024-10-11 LAB — CBG MONITORING, ED
Glucose-Capillary: 351 mg/dL — ABNORMAL HIGH (ref 70–99)
Glucose-Capillary: 368 mg/dL — ABNORMAL HIGH (ref 70–99)

## 2024-10-11 MED ORDER — INSULIN ASPART 100 UNIT/ML IJ SOLN: 10.0000 [IU] | Freq: Once | INTRAMUSCULAR | Status: DC

## 2024-10-11 MED ORDER — DICYCLOMINE HCL 20 MG PO TABS: 20.0000 mg | ORAL_TABLET | Freq: Two times a day (BID) | ORAL | 0 refills | Status: DC

## 2024-10-11 MED ORDER — IOHEXOL 300 MG/ML  SOLN
100.0000 mL | Freq: Once | INTRAMUSCULAR | Status: AC | PRN
  Administered 2024-10-11: 100 mL via INTRAVENOUS

## 2024-10-11 MED ORDER — DICYCLOMINE HCL 10 MG/ML IM SOLN
20.0000 mg | Freq: Once | INTRAMUSCULAR | Status: AC
  Administered 2024-10-11: 20 mg via INTRAMUSCULAR
  Filled 2024-10-11: qty 2

## 2024-10-11 MED ORDER — METOCLOPRAMIDE HCL 10 MG PO TABS: 10.0000 mg | ORAL_TABLET | Freq: Three times a day (TID) | ORAL | 0 refills | Status: DC

## 2024-10-11 MED ORDER — INSULIN ASPART 100 UNIT/ML IJ SOLN
5.0000 [IU] | Freq: Once | INTRAMUSCULAR | Status: AC
  Administered 2024-10-11: 5 [IU] via SUBCUTANEOUS
  Filled 2024-10-11: qty 1

## 2024-10-11 MED ORDER — SODIUM CHLORIDE 0.9 % IV BOLUS
1000.0000 mL | Freq: Once | INTRAVENOUS | Status: AC
  Administered 2024-10-11: 1000 mL via INTRAVENOUS

## 2024-10-11 MED ORDER — KETOROLAC TROMETHAMINE 15 MG/ML IJ SOLN
15.0000 mg | Freq: Once | INTRAMUSCULAR | Status: AC
  Administered 2024-10-11: 15 mg via INTRAVENOUS
  Filled 2024-10-11: qty 1

## 2024-10-11 NOTE — Discharge Instructions (Signed)
 Please continue to monitor your blood sugar closely on an outpatient basis.  Follow-up closely with your primary care doctor for continued management of your blood sugar.  Return to emergency department immediately for any new or worsening symptoms.

## 2024-10-11 NOTE — ED Triage Notes (Signed)
 POV from home. Cc of generalized  abdominal pain and chest pain a couple days ago but after he went fishing today he started feeling worse.  8/10 worse with movement

## 2024-10-11 NOTE — ED Provider Notes (Signed)
 [facility]MENT AT [facility] Provider Note   CSN: [id number] Arrival date & time: 10/11/24  1901     Patient presents with: Abdominal Pain   Robert Lyons is a 63 y.o. male.   Patient is a 63 year old male who presents emergency department the chief complaint of abdominal pain which has been ongoing for approximate the past 2 days as well as associated pain to his chest.  He denies any associated shortness of breath.  He notes that the pain is worse with movement and improves with rest.  He denies any recent falls or blunt chest wall or abdominal trauma.  He has had no associated dizziness, lightheadedness, syncope.  He denies any pain to neck or back.  He has had no nausea, vomiting, diarrhea or constipation.  He denies any dysuria or hematuria.   Abdominal Pain      Prior to Admission medications   Medication Sig Start Date End Date Taking? Authorizing Provider  Accu-Chek Softclix Lancets lancets Use as instructed 02/27/24   Melvenia Manus BRAVO, MD  acetaminophen  (TYLENOL ) 500 MG tablet Take 1,000 mg by mouth every 6 (six) hours as needed.    [provider]  amLODipine  (NORVASC ) 5 MG tablet Take 1 tablet (5 mg total) by mouth daily. 01/18/24   Melvenia Manus BRAVO, MD  aspirin  EC 81 MG tablet Take 1 tablet (81 mg total) by mouth daily with breakfast. Please take Aspirin  81 mg daily along with Plavix  75 mg daily for 21 days then after that STOP the Plavix   and continue ONLY Aspirin  81 mg daily indefinitely--for secondary stroke Prevention Patient taking differently: Take 81 mg by mouth daily with breakfast. 01/20/23   Pearlean Manus, MD  atorvastatin  (LIPITOR) 80 MG tablet Take 1 tablet (80 mg total) by mouth daily. 01/18/24 01/17/25  Melvenia Manus BRAVO, MD  Continuous Glucose Receiver (FREESTYLE LIBRE 3 READER) DEVI 1 Device by Does not apply route daily. 09/04/24   Bevely Doffing, FNP  Continuous Glucose Sensor (FREESTYLE LIBRE 3 PLUS SENSOR) MISC Change  sensor every 15 days. 09/04/24   Bevely Doffing, FNP  gabapentin  (NEURONTIN ) 300 MG capsule Take 1 capsule (300 mg total) by mouth at bedtime. 01/18/24   Melvenia Manus BRAVO, MD  glucose blood (ACCU-CHEK GUIDE TEST) test strip Use 4 times a day with Accu-chek monitor to check blood sugar levels. 06/06/24   Bevely Doffing, FNP  hydrOXYzine  (VISTARIL ) 25 MG capsule Take 1 capsule (25 mg total) by mouth every 8 (eight) hours as needed. 10/17/23   Melvenia Manus BRAVO, MD  insulin  aspart (NOVOLOG  FLEXPEN) 100 UNIT/ML FlexPen Inject 4 Units into the skin 3 (three) times daily with meals. Hold if blood sugar is 80 or less before meal. 09/04/24   Huenink, Doffing, FNP  insulin  glargine (LANTUS  SOLOSTAR) 100 UNIT/ML Solostar Pen Inject 20 Units into the skin at bedtime. 09/04/24   Bevely Doffing, FNP  ketorolac  (ACULAR ) 0.5 % ophthalmic solution  06/01/23   [provider]  Lancets (ACCU-CHEK MULTICLIX) lancets Use as instructed to test blood sugar 4 times daily. DX: E11.65 03/02/23   Melvenia Manus BRAVO, MD  lisinopril  (ZESTRIL ) 40 MG tablet Take 1 tablet (40 mg total) by mouth daily. 01/18/24   Melvenia Manus BRAVO, MD  meclizine  (ANTIVERT ) 25 MG tablet TAKE 1 TABLET BY MOUTH 3 TIMES A DAY AS NEEDED FOR DIZZINESS. 05/01/23   Melvenia Manus BRAVO, MD  metFORMIN  (GLUCOPHAGE ) 1000 MG tablet Take 1 tablet (1,000 mg total) by mouth 2 (  two) times daily with a meal. 03/01/24   Melvenia Manus BRAVO, MD  methocarbamol  (ROBAXIN ) 500 MG tablet Take 1 tablet (500 mg total) by mouth every 6 (six) hours as needed for muscle spasms. 06/04/24   Melvenia Manus BRAVO, MD  metoprolol  tartrate (LOPRESSOR ) 50 MG tablet Take 1 tablet (50 mg total) by mouth 2 (two) times daily. 01/18/24   Melvenia Manus BRAVO, MD  nitroGLYCERIN  (NITROSTAT ) 0.4 MG SL tablet Place 1 tablet (0.4 mg total) under the tongue every 5 (five) minutes as needed for chest pain. 12/07/23   Miriam Norris, NP  NOVOFINE PEN NEEDLE 32G X 6 MM MISC USE AS DIRECTED EVERY DAY 09/04/24   Bevely Doffing,  FNP  ofloxacin (OCUFLOX) 0.3 % ophthalmic solution  06/01/23   [provider]  omeprazole  (PRILOSEC) 40 MG capsule Take 1 capsule (40 mg total) by mouth daily. 01/18/24   Melvenia Manus BRAVO, MD  ondansetron  (ZOFRAN ) 4 MG tablet Take 1 tablet (4 mg total) by mouth every 8 (eight) hours as needed for nausea or vomiting. 06/02/23   Melvenia Manus BRAVO, MD  prednisoLONE acetate (PRED FORTE) 1 % ophthalmic suspension  06/01/23   [provider]  saccharomyces boulardii (FLORASTOR) 250 MG capsule Take 1 capsule (250 mg total) by mouth 2 (two) times daily. 01/18/24   Melvenia Manus BRAVO, MD    Allergies: Patient has no known allergies.    Review of Systems  Gastrointestinal:  Positive for abdominal pain.  All other systems reviewed and are negative.   Updated Vital Signs BP 110/65 (BP Location: Right Arm)   Pulse 89   Temp 98.7 F (37.1 C) (Oral)   Resp 18   Ht 5' 11 (1.803 m)   Wt 85.7 kg   SpO2 100%   BMI 26.36 kg/m   Physical Exam Vitals and nursing note reviewed.  Constitutional:      General: He is not in acute distress.    Appearance: Normal appearance. He is not ill-appearing.  HENT:     Head: Normocephalic and atraumatic.     Nose: Nose normal.     Mouth/Throat:     Mouth: Mucous membranes are moist.  Eyes:     Extraocular Movements: Extraocular movements intact.     Conjunctiva/sclera: Conjunctivae normal.     Pupils: Pupils are equal, round, and reactive to light.  Cardiovascular:     Rate and Rhythm: Normal rate and regular rhythm.     Pulses: Normal pulses.     Heart sounds: Normal heart sounds. No murmur heard.    No gallop.  Pulmonary:     Effort: Pulmonary effort is normal. No respiratory distress.     Breath sounds: Normal breath sounds. No stridor. No wheezing, rhonchi or rales.  Abdominal:     General: Abdomen is flat. Bowel sounds are normal. There is no distension.     Palpations: Abdomen is soft.     Tenderness: There is generalized abdominal  tenderness.     Hernia: No hernia is present.  Musculoskeletal:        General: Normal range of motion.     Cervical back: Normal range of motion and neck supple.  Skin:    General: Skin is warm and dry.     Findings: No erythema or rash.  Neurological:     General: No focal deficit present.     Mental Status: He is alert and oriented to person, place, and time. Mental status is at baseline.  Psychiatric:  Mood and Affect: Mood normal.        Behavior: Behavior normal.        Thought Content: Thought content normal.        Judgment: Judgment normal.     (all labs ordered are listed, but only abnormal results are displayed) Labs Reviewed  COMPREHENSIVE METABOLIC PANEL WITH GFR - Abnormal; Notable for the following components:      Result Value   Glucose, Bld 440 (*)    All other components within normal limits  CBC - Abnormal; Notable for the following components:   RBC 4.04 (*)    Hemoglobin 12.5 (*)    HCT 36.2 (*)    All other components within normal limits  URINALYSIS, ROUTINE W REFLEX MICROSCOPIC - Abnormal; Notable for the following components:   Color, Urine STRAW (*)    Glucose, UA >=500 (*)    All other components within normal limits  CBG MONITORING, ED - Abnormal; Notable for the following components:   Glucose-Capillary 351 (*)    All other components within normal limits  LIPASE, BLOOD  TROPONIN T, HIGH SENSITIVITY    EKG: EKG Interpretation Date/Time:  Friday October 11 2024 19:38:39 EDT Ventricular Rate:  87 PR Interval:  170 QRS Duration:  90 QT Interval:  354 QTC Calculation: 425 R Axis:   34  Text Interpretation: Normal sinus rhythm Normal ECG When compared with ECG of 18-Apr-2024 13:35, No significant change was found Confirmed by Towana Sharper [phone]) on 10/11/2024 7:42:21 PM  Radiology: CT ABDOMEN PELVIS W CONTRAST Result Date: 10/11/2024 EXAM: CT ABDOMEN AND PELVIS WITH CONTRAST 10/11/2024 09:06:14 PM TECHNIQUE: CT of the abdomen  and pelvis was performed with the administration of 100 mL of iohexol  (OMNIPAQUE ) 300 MG/ML solution. Multiplanar reformatted images are provided for review. Automated exposure control, iterative reconstruction, and/or weight-based adjustment of the mA/kV was utilized to reduce the radiation dose to as low as reasonably achievable. COMPARISON: CT 11/25/2023. CLINICAL HISTORY: Abdominal pain, acute, nonlocalized. Generalized abdominal pain and chest pain a couple days ago but after he went fishing today he started feeling worse. FINDINGS: LOWER CHEST: No acute abnormality. LIVER: The liver is unremarkable. GALLBLADDER AND BILE DUCTS: Gallbladder is unremarkable. No biliary ductal dilatation. SPLEEN: No acute abnormality. PANCREAS: No acute abnormality. ADRENAL GLANDS: No acute abnormality. KIDNEYS, URETERS AND BLADDER: No stones in the kidneys or ureters. No hydronephrosis. No perinephric or periureteral stranding. Urinary bladder is unremarkable. GI AND BOWEL: Stomach demonstrates no acute abnormality. There is no bowel obstruction. Normal appendix. PERITONEUM AND RETROPERITONEUM: No ascites. No free air. VASCULATURE: Aorta is normal in caliber. LYMPH NODES: No lymphadenopathy. REPRODUCTIVE ORGANS: No acute abnormality. BONES AND SOFT TISSUES: No acute osseous abnormality. No focal soft tissue abnormality. IMPRESSION: 1. No acute findings. Electronically signed by: Norman Gatlin MD 10/11/2024 09:38 PM EDT RP Workstation: [id number]   DG Chest Port 1 View Result Date: 10/11/2024 CLINICAL DATA:  Chest and abdominal pain for several days EXAM: PORTABLE CHEST 1 VIEW COMPARISON:  11/25/2023 FINDINGS: Single frontal view of the chest demonstrates an unremarkable cardiac silhouette. No acute airspace disease, effusion, or pneumothorax. No acute bony abnormalities. IMPRESSION: 1. No acute intrathoracic process. Electronically Signed   By: Sharper Daring M.D.   On: 10/11/2024 20:46     Procedures   Medications  Ordered in the ED  ketorolac  (TORADOL ) 15 MG/ML injection 15 mg (15 mg Intravenous Given 10/11/24 2019)  dicyclomine (BENTYL) injection 20 mg (20 mg Intramuscular Given 10/11/24 2019)  sodium chloride   0.9 % bolus 1,000 mL (1,000 mLs Intravenous New Bag/Given 10/11/24 2020)  iohexol  (OMNIPAQUE ) 300 MG/ML solution 100 mL (100 mLs Intravenous Contrast Given 10/11/24 2052)  insulin  aspart (novoLOG ) injection 5 Units (5 Units Subcutaneous Given 10/11/24 2210)                                    Medical Decision Making Amount and/or Complexity of Data Reviewed Labs: ordered. Radiology: ordered.  Risk Prescription drug management.   This patient presents to the ED for concern of abdominal pain differential diagnosis includes gastritis, gastroenteritis, gastroparesis, acute appendicitis, cholecystitis, as well obstruction, diverticulitis, testicular torsion, pyelonephritis, kidney stone, pancreatitis, mesenteric ischemia    Additional history obtained:  Additional history obtained from medical records External records from outside source obtained and reviewed including medical records   Lab Tests:  I Ordered, and personally interpreted labs.  The pertinent results include: No leukocytosis, anemia at baseline, normal kidney function liver function, unremarkable electrolytes, unremarkable urinalysis, downtrending glucose, negative lipase, negative troponin   Imaging Studies ordered:  I ordered imaging studies including chest x-ray, CT scan abdomen and pelvis I independently visualized and interpreted imaging which showed no acute cardiopulmonary process, no acute intra-abdominal process I agree with the radiologist interpretation   Medicines ordered and prescription drug management:  I ordered medication including IV fluids, Bentyl, Toradol , insulin  for abdominal pain, hyperglycemia Reevaluation of the patient after these medicines showed that the patient improved I have reviewed  the patients home medicines and have made adjustments as needed   Problem List / ED Course:  Patient is doing better at this time and is stable for discharge home.  Discussed with patient that he does have elevated blood sugar in the emergency department do suspect that this is playing a role in his abdominal pain.  Blood sugar is improving after IV fluids and he was given additional dose of insulin  as well.  He was directed to continue to monitor his blood sugar closely at home.  Will continue symptomatic treatment of his abdominal pain.  CT scan of the abdomen and pelvis demonstrated no signs of acute surgical process.  Low suspicion for ACS at this time.  EKG has no acute ischemic changes and patient has negative troponin.  Symptoms have been ongoing for 2 days and do not suspect that serial troponins are warranted.  Vital signs are stable at this point with no indication for sepsis.  He has no indication for DKA or HHS at this time.  Do not suspect that any additional workup is warranted on emergent basis and did direct patient to follow-up closely with his primary care doctor.  Do not suspect that admission is warranted at this time.  Patient voiced understanding to the plan and had no additional questions.  Strict return precautions were discussed for any new or worsening symptoms.   Social Determinants of Health:  None        Final diagnoses:  None    ED Discharge Orders     None          Daralene Lonni JONETTA DEVONNA 10/11/24 2241    Towana Ozell BROCKS, MD 10/12/24 1020

## 2024-10-21 ENCOUNTER — Other Ambulatory Visit: Payer: Self-pay

## 2024-10-21 ENCOUNTER — Telehealth: Payer: Self-pay

## 2024-10-21 ENCOUNTER — Ambulatory Visit: Payer: Self-pay

## 2024-10-21 DIAGNOSIS — E1136 Type 2 diabetes mellitus with diabetic cataract: Secondary | ICD-10-CM

## 2024-10-21 NOTE — Telephone Encounter (Signed)
 Called to left pt know that His labs show poor control of his diabetes. His A1c was 12.0. Recommend increasing mealtime insulin  by 2 units and his nighttime insulin  by 2 units. Recommend rechecking labs in 2 months. Leita will place order for labs, he can come in for these without a visit.

## 2024-10-29 ENCOUNTER — Emergency Department (HOSPITAL_COMMUNITY)
Admission: EM | Admit: 2024-10-29 | Discharge: 2024-10-30 | Disposition: A | Attending: Emergency Medicine | Admitting: Emergency Medicine

## 2024-10-29 ENCOUNTER — Encounter (HOSPITAL_COMMUNITY): Payer: Self-pay | Admitting: Emergency Medicine

## 2024-10-29 ENCOUNTER — Emergency Department (HOSPITAL_COMMUNITY)

## 2024-10-29 ENCOUNTER — Other Ambulatory Visit: Payer: Self-pay

## 2024-10-29 DIAGNOSIS — I10 Essential (primary) hypertension: Secondary | ICD-10-CM | POA: Insufficient documentation

## 2024-10-29 DIAGNOSIS — Z87891 Personal history of nicotine dependence: Secondary | ICD-10-CM | POA: Diagnosis not present

## 2024-10-29 DIAGNOSIS — R0789 Other chest pain: Secondary | ICD-10-CM | POA: Diagnosis not present

## 2024-10-29 DIAGNOSIS — R079 Chest pain, unspecified: Secondary | ICD-10-CM

## 2024-10-29 DIAGNOSIS — Z8673 Personal history of transient ischemic attack (TIA), and cerebral infarction without residual deficits: Secondary | ICD-10-CM | POA: Insufficient documentation

## 2024-10-29 DIAGNOSIS — E119 Type 2 diabetes mellitus without complications: Secondary | ICD-10-CM | POA: Diagnosis not present

## 2024-10-29 LAB — BASIC METABOLIC PANEL WITH GFR
Anion gap: 11 (ref 5–15)
BUN: 9 mg/dL (ref 8–23)
CO2: 27 mmol/L (ref 22–32)
Calcium: 9.7 mg/dL (ref 8.9–10.3)
Chloride: 99 mmol/L (ref 98–111)
Creatinine, Ser: 1.19 mg/dL (ref 0.61–1.24)
GFR, Estimated: >60 mL/min (ref >60)
Glucose, Bld: 396 mg/dL — ABNORMAL HIGH (ref 70–99)
Potassium: 4.4 mmol/L (ref 3.5–5.1)
Sodium: 137 mmol/L (ref 135–145)

## 2024-10-29 LAB — CBC
HCT: 38.9 % — ABNORMAL LOW (ref 39.0–52.0)
Hemoglobin: 13.1 g/dL (ref 13.0–17.0)
MCH: 30.3 pg (ref 26.0–34.0)
MCHC: 33.7 g/dL (ref 30.0–36.0)
MCV: 90 fL (ref 80.0–100.0)
Platelets: 258 K/uL (ref 150–400)
RBC: 4.32 MIL/uL (ref 4.22–5.81)
RDW: 11.7 % (ref 11.5–15.5)
WBC: 7.4 K/uL (ref 4.0–10.5)
nRBC: 0 % (ref 0.0–0.2)

## 2024-10-29 LAB — TROPONIN T, HIGH SENSITIVITY: Troponin T High Sensitivity: <15 ng/L (ref 0–19)

## 2024-10-29 NOTE — ED Triage Notes (Signed)
 Pt c/o upper abd pain and chest pain since this afternoon. Pt also c/o lower left dental pain.

## 2024-10-30 DIAGNOSIS — R0789 Other chest pain: Secondary | ICD-10-CM | POA: Diagnosis not present

## 2024-10-30 LAB — TROPONIN T, HIGH SENSITIVITY: Troponin T High Sensitivity: <15 ng/L (ref 0–19)

## 2024-10-30 MED ORDER — KETOROLAC TROMETHAMINE 10 MG PO TABS: 10.0000 mg | ORAL_TABLET | Freq: Four times a day (QID) | ORAL | 0 refills | Status: DC | PRN

## 2024-10-30 MED ORDER — KETOROLAC TROMETHAMINE 15 MG/ML IJ SOLN
30.0000 mg | Freq: Once | INTRAMUSCULAR | Status: AC
  Administered 2024-10-30: 30 mg via INTRAMUSCULAR
  Filled 2024-10-30: qty 2

## 2024-10-30 NOTE — Discharge Instructions (Addendum)
 You were evaluated in the Emergency Department and after careful evaluation, we did not find any emergent condition requiring admission or further testing in the hospital.  Your exam/testing today is overall reassuring.  Recommend continued follow-up with your primary care doctor and your cardiologist.  Can use the ketorolac  anti-inflammatory pain medication as needed for pain.  Please return to the Emergency Department if you experience any worsening of your condition.   Thank you for allowing us  to be a part of your care.

## 2024-10-30 NOTE — ED Provider Notes (Signed)
 [facility] Emergency Department Provider Note MRN:  [id number]  Arrival date & time: 10/30/24     Chief Complaint   Chest Pain   History of Present Illness   Robert Lyons is a 63 y.o. year-old male with a history of hypertension, diabetes, stroke presenting to the ED with chief complaint of chest pain.  Left-sided chest pain that comes and spasms, sharp, happening intermittently for the past few months.  No dizziness or diaphoresis, no nausea or vomiting, no shortness of breath, no leg pain or swelling, no fever or cough.  Review of Systems  A thorough review of systems was obtained and all systems are negative except as noted in the HPI and PMH.   Patient's Health History    Past Medical History:  Diagnosis Date   GERD (gastroesophageal reflux disease)    Hypertension    Pneumonia    Stroke [facility])    Type 2 diabetes mellitus (HCC)     Past Surgical History:  Procedure Laterality Date   BIOPSY  07/03/2023   Procedure: BIOPSY;  Surgeon: Cindie Carlin POUR, DO;  Location: [facility] ENDO SUITE;  Service: Endoscopy;;   ESOPHAGOGASTRODUODENOSCOPY (EGD) WITH PROPOFOL  N/A 07/03/2023   Procedure: ESOPHAGOGASTRODUODENOSCOPY (EGD) WITH PROPOFOL ;  Surgeon: Cindie Carlin POUR, DO;  Location: [facility] ENDO SUITE;  Service: Endoscopy;  Laterality: N/A;  10:30 AM, ASA 3   No prior surgery      Family History  Problem Relation Age of Onset   Diabetes Mother    Hypertension Mother    Diabetes Maternal Aunt    Diabetes Maternal Uncle     Social History   Socioeconomic History   Marital status: Single    Spouse name: Not on file   Number of children: Not on file   Years of education: Not on file   Highest education level: Not on file  Occupational History   Not on file  Tobacco Use   Smoking status: Former    Current packs/day: 0.00    Average packs/day: 1 pack/day for 15.0 years (15.0 ttl pk-yrs)    Types: Cigarettes    Start date: 01/18/2008    Quit date: 01/17/2023     Years since quitting: 1.7   Smokeless tobacco: Never  Vaping Use   Vaping status: Never Used  Substance and Sexual Activity   Alcohol use: No    Comment: none since 2014   Drug use: No   Sexual activity: Yes  Other Topics Concern   Not on file  Social History Narrative   Not on file   Social Drivers of Health   Financial Resource Strain: High Risk (02/02/2023)   Overall Financial Resource Strain (CARDIA)    Difficulty of Paying Living Expenses: Very hard  Food Insecurity: No Food Insecurity (01/31/2023)   Hunger Vital Sign    Worried About Running Out of Food in the Last Year: Never true    Ran Out of Food in the Last Year: Never true  Transportation Needs: Unmet Transportation Needs (01/31/2023)   PRAPARE - Administrator, Civil Service (Medical): Yes    Lack of Transportation (Non-Medical): No  Physical Activity: Not on file  Stress: Not on file  Social Connections: Not on file  Intimate Partner Violence: Not At Risk (01/31/2023)   Humiliation, Afraid, Rape, and Kick questionnaire    Fear of Current or Ex-Partner: No    Emotionally Abused: No    Physically Abused: No    Sexually Abused: No  Physical Exam   Vitals:   10/29/24 2247  BP: (!) 152/89  Pulse: 90  Resp: 17  Temp: 97.9 F (36.6 C)  SpO2: 100%    CONSTITUTIONAL: Well-appearing, NAD NEURO/PSYCH:  Alert and oriented x 3, no focal deficits EYES:  eyes equal and reactive ENT/NECK:  no LAD, no JVD CARDIO: Regular rate, well-perfused, normal S1 and S2 PULM:  CTAB no wheezing or rhonchi GI/GU:  non-distended, non-tender MSK/SPINE:  No gross deformities, no edema SKIN:  no rash, atraumatic   *Additional and/or pertinent findings included in MDM below  Diagnostic and Interventional Summary    EKG Interpretation Date/Time:  Tuesday October 29 2024 22:47:42 EST Ventricular Rate:  90 PR Interval:  164 QRS Duration:  90 QT Interval:  348 QTC Calculation: 425 R Axis:   35  Text  Interpretation: Normal sinus rhythm Normal ECG When compared with ECG of 11-Oct-2024 19:38, No significant change was found Confirmed by Theadore Sharper [phone]) on 10/30/2024 1:24:02 AM       Labs Reviewed  BASIC METABOLIC PANEL WITH GFR - Abnormal; Notable for the following components:      Result Value   Glucose, Bld 396 (*)    All other components within normal limits  CBC - Abnormal; Notable for the following components:   HCT 38.9 (*)    All other components within normal limits  TROPONIN T, HIGH SENSITIVITY  TROPONIN T, HIGH SENSITIVITY    DG Chest 2 View  Final Result      Medications  ketorolac  (TORADOL ) 15 MG/ML injection 30 mg (30 mg Intramuscular Given 10/30/24 0216)     Procedures  /  Critical Care Procedures  ED Course and Medical Decision Making  Initial Impression and Ddx Favoring MSK.  Has tenderness to the left anterior chest wall, seems to be happening and spasms.  ACS is also considered given patient's cardiovascular risk factors.  Per chart review patient's cardiology team is aware of this pain and they also favor noncardiac cause.  Past medical/surgical history that increases complexity of ED encounter: Stroke, diabetes  Interpretation of Diagnostics I personally reviewed the EKG and my interpretation is as follows: Sinus rhythm without acute ischemic changes  No significant blood count or electrolyte disturbance.  Troponin negative x 2  Patient Reassessment and Ultimate Disposition/Management     Patient feeling better after Toradol , appropriate for discharge with follow-up.  Patient management required discussion with the following services or consulting groups:  None  Complexity of Problems Addressed Acute illness or injury that poses threat of life of bodily function  Additional Data Reviewed and Analyzed Further history obtained from: Prior labs/imaging results  Additional Factors Impacting ED Encounter Risk Consideration of  hospitalization  Sharper HERO. Theadore, MD [facility] Emergency [facility] [email or web address]  Final Clinical Impressions(s) / ED Diagnoses     ICD-10-CM   1. Chest pain, unspecified type  R07.9       ED Discharge Orders          Ordered    ketorolac  (TORADOL ) 10 MG tablet  Every 6 hours PRN        10/30/24 0242             Discharge Instructions Discussed with and Provided to Patient:    Discharge Instructions      You were evaluated in the Emergency Department and after careful evaluation, we did not find any emergent condition requiring admission or further testing in the hospital.  Your exam/testing today  is overall reassuring.  Recommend continued follow-up with your primary care doctor and your cardiologist.  Can use the ketorolac  anti-inflammatory pain medication as needed for pain.  Please return to the Emergency Department if you experience any worsening of your condition.   Thank you for allowing us  to be a part of your care.      Theadore Ozell HERO, MD 10/30/24 [phone]

## 2024-11-25 ENCOUNTER — Ambulatory Visit: Admitting: Podiatry

## 2024-11-26 ENCOUNTER — Telehealth: Payer: Self-pay

## 2024-11-26 ENCOUNTER — Other Ambulatory Visit: Payer: Self-pay

## 2024-11-26 DIAGNOSIS — I10 Essential (primary) hypertension: Secondary | ICD-10-CM

## 2024-11-26 DIAGNOSIS — E1136 Type 2 diabetes mellitus with diabetic cataract: Secondary | ICD-10-CM

## 2024-11-26 DIAGNOSIS — Z794 Long term (current) use of insulin: Secondary | ICD-10-CM

## 2024-11-26 DIAGNOSIS — G8929 Other chronic pain: Secondary | ICD-10-CM

## 2024-11-26 DIAGNOSIS — E785 Hyperlipidemia, unspecified: Secondary | ICD-10-CM

## 2024-11-26 DIAGNOSIS — E1140 Type 2 diabetes mellitus with diabetic neuropathy, unspecified: Secondary | ICD-10-CM

## 2024-11-26 DIAGNOSIS — L299 Pruritus, unspecified: Secondary | ICD-10-CM

## 2024-11-26 DIAGNOSIS — K219 Gastro-esophageal reflux disease without esophagitis: Secondary | ICD-10-CM

## 2024-11-26 MED ORDER — NOVOLOG FLEXPEN 100 UNIT/ML ~~LOC~~ SOPN: 4.0000 [IU] | PEN_INJECTOR | Freq: Three times a day (TID) | SUBCUTANEOUS | 5 refills | Status: AC

## 2024-11-26 MED ORDER — DICYCLOMINE HCL 20 MG PO TABS: 20.0000 mg | ORAL_TABLET | Freq: Two times a day (BID) | ORAL | 0 refills | Status: AC

## 2024-11-26 MED ORDER — METOPROLOL TARTRATE 50 MG PO TABS: 50.0000 mg | ORAL_TABLET | Freq: Two times a day (BID) | ORAL | 4 refills | Status: AC

## 2024-11-26 MED ORDER — ATORVASTATIN CALCIUM 80 MG PO TABS: 80.0000 mg | ORAL_TABLET | Freq: Every day | ORAL | 3 refills | Status: AC

## 2024-11-26 MED ORDER — METFORMIN HCL 1000 MG PO TABS: 1000.0000 mg | ORAL_TABLET | Freq: Two times a day (BID) | ORAL | 3 refills | Status: AC

## 2024-11-26 MED ORDER — HYDROXYZINE PAMOATE 25 MG PO CAPS: 25.0000 mg | ORAL_CAPSULE | Freq: Three times a day (TID) | ORAL | 1 refills | Status: AC | PRN

## 2024-11-26 MED ORDER — GABAPENTIN 300 MG PO CAPS: 300.0000 mg | ORAL_CAPSULE | Freq: Every day | ORAL | 2 refills | Status: AC

## 2024-11-26 MED ORDER — METOCLOPRAMIDE HCL 10 MG PO TABS: 10.0000 mg | ORAL_TABLET | Freq: Three times a day (TID) | ORAL | 0 refills | Status: AC

## 2024-11-26 MED ORDER — METHOCARBAMOL 500 MG PO TABS: 500.0000 mg | ORAL_TABLET | Freq: Four times a day (QID) | ORAL | 1 refills | Status: AC | PRN

## 2024-11-26 MED ORDER — LISINOPRIL 40 MG PO TABS: 40.0000 mg | ORAL_TABLET | Freq: Every day | ORAL | 3 refills | Status: AC

## 2024-11-26 MED ORDER — OMEPRAZOLE 40 MG PO CPDR: 40.0000 mg | DELAYED_RELEASE_CAPSULE | Freq: Every day | ORAL | 3 refills | Status: AC

## 2024-11-26 MED ORDER — ACCU-CHEK GUIDE TEST VI STRP: ORAL_STRIP | 12 refills | Status: AC

## 2024-11-26 MED ORDER — NOVOFINE PEN NEEDLE 32G X 6 MM MISC: 1.0000 | Freq: Every day | 0 refills | Status: DC

## 2024-11-26 MED ORDER — NITROGLYCERIN 0.4 MG SL SUBL: 0.4000 mg | SUBLINGUAL_TABLET | SUBLINGUAL | 1 refills | Status: AC | PRN

## 2024-11-26 MED ORDER — LANTUS SOLOSTAR 100 UNIT/ML ~~LOC~~ SOPN: 20.0000 [IU] | PEN_INJECTOR | Freq: Every day | SUBCUTANEOUS | 3 refills | Status: AC

## 2024-11-26 MED ORDER — KETOROLAC TROMETHAMINE 10 MG PO TABS: 10.0000 mg | ORAL_TABLET | Freq: Four times a day (QID) | ORAL | 0 refills | Status: AC | PRN

## 2024-11-26 MED ORDER — SACCHAROMYCES BOULARDII 250 MG PO CAPS: 250.0000 mg | ORAL_CAPSULE | Freq: Two times a day (BID) | ORAL | 3 refills | Status: AC

## 2024-11-26 NOTE — Telephone Encounter (Signed)
 Copied from CRM #[id number]. Topic: Clinical - Medication Question >> Nov 26, 2024  1:30 PM Deaijah H wrote: Reason for CRM: Penne w/ [employer] does not cover what's written (received in the past) new insurance will not cover and would like to know if he can substitute it. Please call [phone]

## 2024-11-26 NOTE — Telephone Encounter (Signed)
 Copied from CRM #[id number]. Topic: Clinical - Medication Refill >> Nov 26, 2024  9:01 AM Robert Lyons wrote: Medication:  insulin  aspart (NOVOLOG  FLEXPEN) 100 UNIT/ML FlexPen atorvastatin  (LIPITOR) 80 MG tablet dicyclomine  (BENTYL ) 20 MG tablet gabapentin  (NEURONTIN ) 300 MG capsule glucose blood (ACCU-CHEK GUIDE TEST) test strip hydrOXYzine  (VISTARIL ) 25 MG capsule insulin  glargine (LANTUS  SOLOSTAR) 100 UNIT/ML Solostar Pen  ketorolac  (TORADOL ) 10 MG tablet lisinopril  (ZESTRIL ) 40 MG tablet metFORMIN  (GLUCOPHAGE ) 1000 MG tablet methocarbamol  (ROBAXIN ) 500 MG tablet metoCLOPramide  (REGLAN ) 10 MG tablet metoprolol  tartrate (LOPRESSOR ) 50 MG tablet nitroGLYCERIN  (NITROSTAT ) 0.4 MG SL tablet NOVOFINE PEN NEEDLE 32G X 6 MM MISC omeprazole  (PRILOSEC) 40 MG capsule  saccharomyces boulardii (FLORASTOR) 250 MG capsule   Has the patient contacted their pharmacy? Yes (Agent: If no, request that the patient contact the pharmacy for the refill. If patient does not wish to contact the pharmacy document the reason why and proceed with request.) (Agent: If yes, when and what did the pharmacy advise?) Pharmacy needs order to refill  This is the patient's preferred pharmacy:  [facility] - [place], [place] - [address] [place] [address] Phone: [phone] Fax: [phone]  Is this the correct pharmacy for this prescription? Yes If no, delete pharmacy and type the correct one.   Has the prescription been filled recently? No  Is the patient out of the medication? Yes - He is almost out of his medications  Has the patient been seen for an appointment in the last year OR does the patient have an upcoming appointment? Yes - I made appointment for March 2025  Can we respond through MyChart? No  Agent: Please be advised that Rx refills may take up to 3 business days. We ask that you follow-up with your pharmacy.

## 2024-11-26 NOTE — Telephone Encounter (Signed)
 Robert Lyons was not available was told he will call me back

## 2024-11-26 NOTE — Telephone Encounter (Signed)
 Copied from CRM #[id number]. Topic: General - Other >> Nov 26, 2024  3:54 PM Antony S wrote: Reason for CRM: Robert Lyons with [facility] returning a call to [employer], per cal kenzie left for the day, he's requesting someone else call him back for the pen needles

## 2024-11-27 ENCOUNTER — Other Ambulatory Visit: Payer: Self-pay

## 2024-11-27 MED ORDER — NOVOFINE PEN NEEDLE 32G X 6 MM MISC: 1.0000 | Freq: Every day | 0 refills | Status: AC

## 2024-11-29 NOTE — Telephone Encounter (Signed)
 Noted, Spoke with brandon at [facility]

## 2024-12-02 ENCOUNTER — Encounter: Payer: Self-pay | Admitting: Podiatry

## 2024-12-02 ENCOUNTER — Ambulatory Visit: Payer: Medicare (Managed Care) | Admitting: Podiatry

## 2024-12-02 DIAGNOSIS — M79675 Pain in left toe(s): Secondary | ICD-10-CM | POA: Diagnosis not present

## 2024-12-02 DIAGNOSIS — M79674 Pain in right toe(s): Secondary | ICD-10-CM | POA: Diagnosis not present

## 2024-12-02 DIAGNOSIS — E119 Type 2 diabetes mellitus without complications: Secondary | ICD-10-CM

## 2024-12-02 DIAGNOSIS — B351 Tinea unguium: Secondary | ICD-10-CM | POA: Diagnosis not present

## 2024-12-02 NOTE — Progress Notes (Signed)
 This patient returns to my office for at risk foot care.  This patient requires this care by a professional since this patient will be at risk due to having diabetes.This patient is unable to cut nails himself since the patient cannot reach his nails.These nails are painful walking and wearing shoes.  This patient presents for at risk foot care today.  General Appearance  Alert, conversant and in no acute stress.  Vascular  Dorsalis pedis and posterior tibial  pulses are palpable  bilaterally.  Capillary return is within normal limits  bilaterally. Temperature is within normal limits  bilaterally.  Neurologic  Senn-Weinstein monofilament wire test within normal limits  bilaterally. Muscle power within normal limits bilaterally.  Nails Thick disfigured discolored nails with subungual debris  from hallux to fifth toes bilaterally. No evidence of bacterial infection or drainage bilaterally.  Orthopedic  No limitations of motion  feet .  No crepitus or effusions noted.  No bony pathology or digital deformities noted.  Skin  normotropic skin with no porokeratosis noted bilaterally.  No signs of infections or ulcers noted.     Onychomycosis  Pain in right toes  Pain in left toes  Consent was obtained for treatment procedures.   Mechanical debridement of nails 1-5  bilaterally performed with a nail nipper.  Filed with dremel without incident.    Return office visit   3  months                   Told patient to return for periodic foot care and evaluation due to potential at risk complications.   Cordella Bold DPM  tomma

## 2025-01-10 ENCOUNTER — Telehealth: Payer: Self-pay | Admitting: Pharmacy Technician

## 2025-01-10 NOTE — Telephone Encounter (Signed)
 Pharmacy Patient Advocate Encounter   Received notification from [facility] KEY that prior authorization for Ozempic  (0.25 or 0.5 MG/DOSE) 2MG /3ML pen-injectors is due for renewal.   Insurance verification completed.   The patient is insured through [facility] MEDICAID.  Action: Medication has been discontinued. Archived Key: [id number]

## 2025-01-29 ENCOUNTER — Emergency Department (HOSPITAL_COMMUNITY)
Admission: EM | Admit: 2025-01-29 | Discharge: 2025-01-29 | Disposition: A | Discharge status: Home / Self Care | Payer: Medicare (Managed Care) | Source: Home / Self Care | Attending: Emergency Medicine | Admitting: Emergency Medicine

## 2025-01-29 ENCOUNTER — Encounter (HOSPITAL_COMMUNITY): Payer: Self-pay

## 2025-01-29 ENCOUNTER — Emergency Department (HOSPITAL_COMMUNITY): Payer: Medicare (Managed Care)

## 2025-01-29 ENCOUNTER — Other Ambulatory Visit: Payer: Self-pay

## 2025-01-29 DIAGNOSIS — R739 Hyperglycemia, unspecified: Secondary | ICD-10-CM

## 2025-01-29 DIAGNOSIS — J40 Bronchitis, not specified as acute or chronic: Secondary | ICD-10-CM

## 2025-01-29 LAB — CBC
HCT: 37.4 % — ABNORMAL LOW (ref 39.0–52.0)
Hemoglobin: 12.5 g/dL — ABNORMAL LOW (ref 13.0–17.0)
MCH: 30.3 pg (ref 26.0–34.0)
MCHC: 33.4 g/dL (ref 30.0–36.0)
MCV: 90.8 fL (ref 80.0–100.0)
Platelets: 229 10*3/uL (ref 150–400)
RBC: 4.12 MIL/uL — ABNORMAL LOW (ref 4.22–5.81)
RDW: 11.7 % (ref 11.5–15.5)
WBC: 8.9 10*3/uL (ref 4.0–10.5)
nRBC: 0 % (ref 0.0–0.2)

## 2025-01-29 LAB — CBG MONITORING, ED: Glucose-Capillary: 343 mg/dL — ABNORMAL HIGH (ref 70–99)

## 2025-01-29 LAB — BASIC METABOLIC PANEL WITH GFR
Anion gap: 16 — ABNORMAL HIGH (ref 5–15)
BUN: 8 mg/dL (ref 8–23)
CO2: 24 mmol/L (ref 22–32)
Calcium: 9.4 mg/dL (ref 8.9–10.3)
Chloride: 97 mmol/L — ABNORMAL LOW (ref 98–111)
Creatinine, Ser: 1.24 mg/dL (ref 0.61–1.24)
GFR, Estimated: >60 mL/min
Glucose, Bld: 522 mg/dL (ref 70–99)
Potassium: 4.3 mmol/L (ref 3.5–5.1)
Sodium: 137 mmol/L (ref 135–145)

## 2025-01-29 LAB — TROPONIN T, HIGH SENSITIVITY
Troponin T High Sensitivity: 9 ng/L (ref 0–19)
Troponin T High Sensitivity: 8 ng/L (ref 0–19)

## 2025-01-29 MED ORDER — BENZONATATE 100 MG PO CAPS: 100.0000 mg | ORAL_CAPSULE | Freq: Three times a day (TID) | ORAL | 0 refills | Status: AC

## 2025-01-29 MED ORDER — SODIUM CHLORIDE 0.9 % IV BOLUS
1000.0000 mL | Freq: Once | INTRAVENOUS | Status: AC
  Administered 2025-01-29: 1000 mL via INTRAVENOUS

## 2025-01-29 MED ORDER — INSULIN ASPART 100 UNIT/ML IJ SOLN
10.0000 [IU] | Freq: Once | INTRAMUSCULAR | Status: AC
  Administered 2025-01-29: 10 [IU] via SUBCUTANEOUS
  Filled 2025-01-29: qty 1

## 2025-01-29 NOTE — ED Provider Notes (Signed)
 "  [facility]  Provider Note  CSN: [id number] Arrival date & time: 01/29/25 0010  History Chief Complaint  Patient presents with   Chest Pain    Robert Lyons is a 64 y.o. male with history of HTN, DM and CVA reports 2-3 days of cough, not productive but causes chest and back pain. No fever. No SOB. Feels similar to remote episode of pneumonia. He no longer smokes. He has been seen for similar in the past with negative ED workup and states 'nobody did anything about it'. He reports he does not always take his medication as prescribed.    Home Medications Prior to Admission medications  Medication Sig Start Date End Date Taking? Authorizing Provider  benzonatate  (TESSALON ) 100 MG capsule Take 1 capsule (100 mg total) by mouth every 8 (eight) hours. 01/29/25  Yes Roselyn Carlin NOVAK, MD  Accu-Chek Softclix Lancets lancets Use as instructed 02/27/24   Melvenia Manus BRAVO, MD  acetaminophen  (TYLENOL ) 500 MG tablet Take 1,000 mg by mouth every 6 (six) hours as needed.    [provider]  amLODipine  (NORVASC ) 5 MG tablet Take 1 tablet (5 mg total) by mouth daily. 01/18/24   Melvenia Manus BRAVO, MD  aspirin  EC 81 MG tablet Take 1 tablet (81 mg total) by mouth daily with breakfast. Please take Aspirin  81 mg daily along with Plavix  75 mg daily for 21 days then after that STOP the Plavix   and continue ONLY Aspirin  81 mg daily indefinitely--for secondary stroke Prevention Patient taking differently: Take 81 mg by mouth daily with breakfast. 01/20/23   Emokpae, Courage, MD  atorvastatin  (LIPITOR) 80 MG tablet Take 1 tablet (80 mg total) by mouth daily. 11/26/24 11/26/25  Bevely Doffing, FNP  Continuous Glucose Receiver (FREESTYLE LIBRE 3 READER) DEVI 1 Device by Does not apply route daily. 09/04/24   Bevely Doffing, FNP  Continuous Glucose Sensor (FREESTYLE LIBRE 3 PLUS SENSOR) MISC Change sensor every 15 days. 09/04/24   Bevely Doffing, FNP  dicyclomine  (BENTYL )  20 MG tablet Take 1 tablet (20 mg total) by mouth 2 (two) times daily. 11/26/24   Bevely Doffing, FNP  gabapentin  (NEURONTIN ) 300 MG capsule Take 1 capsule (300 mg total) by mouth at bedtime. 11/26/24   Bevely Doffing, FNP  glucose blood (ACCU-CHEK GUIDE TEST) test strip Use 4 times a day with Accu-chek monitor to check blood sugar levels. 11/26/24   Bevely Doffing, FNP  hydrOXYzine  (VISTARIL ) 25 MG capsule Take 1 capsule (25 mg total) by mouth every 8 (eight) hours as needed. 11/26/24   Bevely Doffing, FNP  insulin  aspart (NOVOLOG  FLEXPEN) 100 UNIT/ML FlexPen Inject 4 Units into the skin 3 (three) times daily with meals. Hold if blood sugar is 80 or less before meal. 11/26/24   Huenink, Doffing, FNP  insulin  glargine (LANTUS  SOLOSTAR) 100 UNIT/ML Solostar Pen Inject 20 Units into the skin at bedtime. 11/26/24   Bevely Doffing, FNP  Insulin  Pen Needle (NOVOFINE PEN NEEDLE) 32G X 6 MM MISC 1 each by Does not apply route daily. Use to administer insulin  four times daily DX E11.65 11/27/24   Bevely Doffing, FNP  ketorolac  (ACULAR ) 0.5 % ophthalmic solution  06/01/23   [provider]  ketorolac  (TORADOL ) 10 MG tablet Take 1 tablet (10 mg total) by mouth every 6 (six) hours as needed. 11/26/24   Bevely Doffing, FNP  Lancets (ACCU-CHEK MULTICLIX) lancets Use as instructed to test blood sugar 4 times daily. DX: Z88.34 03/02/23  Melvenia Manus BRAVO, MD  lisinopril  (ZESTRIL ) 40 MG tablet Take 1 tablet (40 mg total) by mouth daily. 11/26/24   Bevely Doffing, FNP  meclizine  (ANTIVERT ) 25 MG tablet TAKE 1 TABLET BY MOUTH 3 TIMES A DAY AS NEEDED FOR DIZZINESS. 05/01/23   Melvenia Manus BRAVO, MD  metFORMIN  (GLUCOPHAGE ) 1000 MG tablet Take 1 tablet (1,000 mg total) by mouth 2 (two) times daily with a meal. 11/26/24   Huenink, Doffing, FNP  methocarbamol  (ROBAXIN ) 500 MG tablet Take 1 tablet (500 mg total) by mouth every 6 (six) hours as needed for muscle spasms. 11/26/24   Bevely Doffing, FNP  metoCLOPramide  (REGLAN ) 10 MG tablet  Take 1 tablet (10 mg total) by mouth 3 (three) times daily before meals. 11/26/24   Bevely Doffing, FNP  metoprolol  tartrate (LOPRESSOR ) 50 MG tablet Take 1 tablet (50 mg total) by mouth 2 (two) times daily. 11/26/24   Bevely Doffing, FNP  nitroGLYCERIN  (NITROSTAT ) 0.4 MG SL tablet Place 1 tablet (0.4 mg total) under the tongue every 5 (five) minutes as needed for chest pain. 11/26/24   Bevely Doffing, FNP  ofloxacin (OCUFLOX) 0.3 % ophthalmic solution  06/01/23   [provider]  omeprazole  (PRILOSEC) 40 MG capsule Take 1 capsule (40 mg total) by mouth daily. 11/26/24   Bevely Doffing, FNP  ondansetron  (ZOFRAN ) 4 MG tablet Take 1 tablet (4 mg total) by mouth every 8 (eight) hours as needed for nausea or vomiting. 06/02/23   Melvenia Manus BRAVO, MD  prednisoLONE acetate (PRED FORTE) 1 % ophthalmic suspension  06/01/23   [provider]  saccharomyces boulardii (FLORASTOR) 250 MG capsule Take 1 capsule (250 mg total) by mouth 2 (two) times daily. 11/26/24   Bevely Doffing, FNP     Allergies    Patient has no known allergies.   Review of Systems   Review of Systems Please see HPI for pertinent positives and negatives  Physical Exam BP (!) 144/88   Pulse 83   Temp 98.3 F (36.8 C) (Oral)   Resp 17   Ht 5' 11 (1.803 m)   Wt 79.4 kg   SpO2 98%   BMI 24.41 kg/m   Physical Exam Vitals and nursing note reviewed.  Constitutional:      Appearance: Normal appearance.  HENT:     Head: Normocephalic and atraumatic.     Nose: Nose normal.     Mouth/Throat:     Mouth: Mucous membranes are moist.  Eyes:     Extraocular Movements: Extraocular movements intact.     Conjunctiva/sclera: Conjunctivae normal.  Cardiovascular:     Rate and Rhythm: Normal rate.  Pulmonary:     Effort: Pulmonary effort is normal.     Breath sounds: Normal breath sounds. No decreased breath sounds.  Chest:     Chest wall: Tenderness (anterior) present.  Abdominal:     General: Abdomen is flat.      Palpations: Abdomen is soft.     Tenderness: There is no abdominal tenderness.  Musculoskeletal:        General: No swelling. Normal range of motion.     Cervical back: Neck supple.  Skin:    General: Skin is warm and dry.  Neurological:     General: No focal deficit present.     Mental Status: He is alert.  Psychiatric:        Mood and Affect: Mood normal.     ED Results / Procedures / Treatments   EKG EKG Interpretation Date/Time:  Wednesday January 29 2025 01:36:57 EST Ventricular Rate:  93 PR Interval:  159 QRS Duration:  95 QT Interval:  374 QTC Calculation: 466 R Axis:   25  Text Interpretation: Sinus rhythm Abnormal R-wave progression, early transition No significant change since last tracing Confirmed by Roselyn Dunnings [phone]) on 01/29/2025 1:45:15 AM  Procedures Procedures  Medications Ordered in the ED Medications  sodium chloride  0.9 % bolus 1,000 mL (1,000 mLs Intravenous New Bag/Given 01/29/25 0221)  insulin  aspart (novoLOG ) injection 10 Units (10 Units Subcutaneous Given 01/29/25 0221)    Initial Impression and Plan  Patient here with atypical and reproducible chest pains in setting of dry cough. Suspect a viral bronchitis, will check labs, CXR. Overall vitals and exam are reassuring.   ED Course   Clinical Course as of 01/29/25 0343  Wed Jan 29, 2025  0119 I personally viewed the images from radiology studies and agree with radiologist interpretation: CXR is clear [CS]  0120 CBC is unremarkable.  [CS]  0144 Trop is neg. BMP with elevated glucose without signs of DKA. Will give a dose of insulin  and a liter of IVF and reassess. Patient encouraged to take his medications as prescribed. Will avoid steroids as treatment for his presumed bronchitis give uncontrolled glucose.  [CS]  0330 Delta trop remains normal. Will check CBG.  [CS]  0339 CBG improving, plan discharge with recommendations to take home medications as prescribed. OTC cough medicine, will also  give Rx for tessalon  to see if that helps. PCP follow up, RTED for any other concerns.   [CS]    Clinical Course User Index [CS] Roselyn Dunnings NOVAK, MD     MDM Rules/Calculators/A&P Medical Decision Making Problems Addressed: Bronchitis: acute illness or injury Hyperglycemia: chronic illness or injury with exacerbation, progression, or side effects of treatment  Amount and/or Complexity of Data Reviewed Labs: ordered. Decision-making details documented in ED Course. Radiology: ordered and independent interpretation performed. Decision-making details documented in ED Course. ECG/medicine tests: ordered and independent interpretation performed. Decision-making details documented in ED Course.  Risk Prescription drug management.     Final Clinical Impression(s) / ED Diagnoses Final diagnoses:  Bronchitis  Hyperglycemia    Rx / DC Orders ED Discharge Orders          Ordered    benzonatate  (TESSALON ) 100 MG capsule  Every 8 hours        01/29/25 0342             Roselyn Dunnings NOVAK, MD 01/29/25 [phone]  "

## 2025-01-29 NOTE — ED Triage Notes (Signed)
 Patient from home for chest pain that started a couple of days ago, along with a painful cough and SOB with exertion. Patient reports this is what it felt like when he had pneumonia. Upon arrival to ER, patient is alert and oriented, ambu

## 2025-01-29 NOTE — ED Notes (Signed)
 Patient transported to X-ray

## 2025-03-03 ENCOUNTER — Ambulatory Visit: Payer: Medicare (Managed Care) | Admitting: Podiatry

## 2025-03-07 ENCOUNTER — Ambulatory Visit: Payer: Self-pay
